# Patient Record
Sex: Male | Born: 1935 | Race: White | Hispanic: No | Smoking: Never smoker
Health system: Southern US, Community
[De-identification: ages and names within clinical notes are randomized; demographics above are authoritative.]

## PROBLEM LIST (undated history)

## (undated) DIAGNOSIS — I472 Ventricular tachycardia, unspecified: Secondary | ICD-10-CM

## (undated) DIAGNOSIS — N189 Chronic kidney disease, unspecified: Secondary | ICD-10-CM

## (undated) DIAGNOSIS — K4090 Unilateral inguinal hernia, without obstruction or gangrene, not specified as recurrent: Secondary | ICD-10-CM

## (undated) DIAGNOSIS — E785 Hyperlipidemia, unspecified: Secondary | ICD-10-CM

## (undated) DIAGNOSIS — I480 Paroxysmal atrial fibrillation: Secondary | ICD-10-CM

## (undated) DIAGNOSIS — I2510 Atherosclerotic heart disease of native coronary artery without angina pectoris: Secondary | ICD-10-CM

## (undated) DIAGNOSIS — Z87442 Personal history of urinary calculi: Secondary | ICD-10-CM

## (undated) DIAGNOSIS — I509 Heart failure, unspecified: Secondary | ICD-10-CM

## (undated) DIAGNOSIS — I10 Essential (primary) hypertension: Secondary | ICD-10-CM

## (undated) HISTORY — DX: Paroxysmal atrial fibrillation: I48.0

## (undated) HISTORY — PX: OTHER SURGICAL HISTORY: SHX169

## (undated) HISTORY — PX: CARDIAC CATHETERIZATION: SHX172

## (undated) HISTORY — PX: LITHOTRIPSY: SUR834

## (undated) HISTORY — PX: INGUINAL HERNIA REPAIR: SHX194

## (undated) HISTORY — PX: ELBOW SURGERY: SHX618

---

## 2007-01-07 ENCOUNTER — Emergency Department: Payer: Self-pay | Admitting: Emergency Medicine

## 2007-01-11 HISTORY — PX: CARDIAC DEFIBRILLATOR PLACEMENT: SHX171

## 2007-01-15 ENCOUNTER — Ambulatory Visit: Payer: Self-pay | Admitting: Orthopedic Surgery

## 2007-01-15 ENCOUNTER — Other Ambulatory Visit: Payer: Self-pay

## 2007-01-16 ENCOUNTER — Ambulatory Visit: Payer: Self-pay | Admitting: Orthopedic Surgery

## 2007-06-07 ENCOUNTER — Other Ambulatory Visit: Payer: Self-pay

## 2007-06-07 ENCOUNTER — Inpatient Hospital Stay: Payer: Self-pay | Admitting: Internal Medicine

## 2008-04-02 ENCOUNTER — Ambulatory Visit: Payer: Self-pay | Admitting: Internal Medicine

## 2009-07-09 ENCOUNTER — Ambulatory Visit: Payer: Self-pay | Admitting: Internal Medicine

## 2009-08-14 ENCOUNTER — Ambulatory Visit: Payer: Self-pay | Admitting: Urology

## 2009-08-20 ENCOUNTER — Ambulatory Visit: Payer: Self-pay | Admitting: Urology

## 2009-10-29 ENCOUNTER — Ambulatory Visit: Payer: Self-pay | Admitting: Urology

## 2010-02-10 ENCOUNTER — Ambulatory Visit: Payer: Self-pay | Admitting: Urology

## 2011-10-07 DIAGNOSIS — N401 Enlarged prostate with lower urinary tract symptoms: Secondary | ICD-10-CM | POA: Insufficient documentation

## 2011-10-07 DIAGNOSIS — N23 Unspecified renal colic: Secondary | ICD-10-CM

## 2011-10-07 DIAGNOSIS — R399 Unspecified symptoms and signs involving the genitourinary system: Secondary | ICD-10-CM | POA: Insufficient documentation

## 2011-10-07 DIAGNOSIS — N3000 Acute cystitis without hematuria: Secondary | ICD-10-CM | POA: Insufficient documentation

## 2011-10-07 HISTORY — DX: Unspecified renal colic: N23

## 2012-01-23 ENCOUNTER — Ambulatory Visit: Payer: Self-pay | Admitting: Urology

## 2012-08-07 ENCOUNTER — Ambulatory Visit: Payer: Self-pay | Admitting: Urology

## 2012-08-07 DIAGNOSIS — R310 Gross hematuria: Secondary | ICD-10-CM | POA: Insufficient documentation

## 2012-08-07 DIAGNOSIS — N210 Calculus in bladder: Secondary | ICD-10-CM | POA: Insufficient documentation

## 2012-08-07 DIAGNOSIS — R339 Retention of urine, unspecified: Secondary | ICD-10-CM | POA: Insufficient documentation

## 2012-09-13 ENCOUNTER — Ambulatory Visit: Payer: Self-pay | Admitting: Urology

## 2012-09-13 LAB — CBC WITH DIFFERENTIAL/PLATELET
HCT: 40.8 % (ref 40.0–52.0)
HGB: 13.7 g/dL (ref 13.0–18.0)
Lymphocyte %: 26.5 %
MCHC: 33.6 g/dL (ref 32.0–36.0)
MCV: 90 fL (ref 80–100)
Monocyte #: 0.5 x10 3/mm (ref 0.2–1.0)
Neutrophil #: 3.4 10*3/uL (ref 1.4–6.5)
Platelet: 117 10*3/uL — ABNORMAL LOW (ref 150–440)
RBC: 4.54 10*6/uL (ref 4.40–5.90)
WBC: 5.8 10*3/uL (ref 3.8–10.6)

## 2012-09-13 LAB — BASIC METABOLIC PANEL
Anion Gap: 3 — ABNORMAL LOW (ref 7–16)
BUN: 25 mg/dL — ABNORMAL HIGH (ref 7–18)
Calcium, Total: 9 mg/dL (ref 8.5–10.1)
Chloride: 110 mmol/L — ABNORMAL HIGH (ref 98–107)
Co2: 30 mmol/L (ref 21–32)
Creatinine: 1.47 mg/dL — ABNORMAL HIGH (ref 0.60–1.30)
EGFR (African American): 52 — ABNORMAL LOW
EGFR (Non-African Amer.): 45 — ABNORMAL LOW
Glucose: 82 mg/dL (ref 65–99)
Osmolality: 288 (ref 275–301)
Potassium: 4.2 mmol/L (ref 3.5–5.1)
Sodium: 143 mmol/L (ref 136–145)

## 2012-09-18 ENCOUNTER — Ambulatory Visit: Payer: Self-pay | Admitting: Urology

## 2012-10-27 ENCOUNTER — Emergency Department: Payer: Self-pay | Admitting: Emergency Medicine

## 2012-10-27 LAB — CBC
HGB: 14.3 g/dL (ref 13.0–18.0)
MCH: 30.2 pg (ref 26.0–34.0)
MCHC: 33.8 g/dL (ref 32.0–36.0)
MCV: 89 fL (ref 80–100)
Platelet: 163 10*3/uL (ref 150–440)
RBC: 4.75 10*6/uL (ref 4.40–5.90)

## 2012-10-27 LAB — BASIC METABOLIC PANEL
Chloride: 104 mmol/L (ref 98–107)
Co2: 34 mmol/L — ABNORMAL HIGH (ref 21–32)
EGFR (African American): 49 — ABNORMAL LOW
Glucose: 127 mg/dL — ABNORMAL HIGH (ref 65–99)
Potassium: 3.9 mmol/L (ref 3.5–5.1)

## 2012-10-27 LAB — TROPONIN I
Troponin-I: <0.02 ng/mL
Troponin-I: <0.02 ng/mL

## 2013-03-25 DIAGNOSIS — Z95810 Presence of automatic (implantable) cardiac defibrillator: Secondary | ICD-10-CM | POA: Insufficient documentation

## 2013-04-05 DIAGNOSIS — I471 Supraventricular tachycardia: Secondary | ICD-10-CM | POA: Insufficient documentation

## 2013-04-05 DIAGNOSIS — I472 Ventricular tachycardia, unspecified: Secondary | ICD-10-CM | POA: Insufficient documentation

## 2013-04-09 ENCOUNTER — Ambulatory Visit: Payer: Self-pay | Admitting: Cardiology

## 2013-04-09 LAB — BASIC METABOLIC PANEL
Anion Gap: 3 — ABNORMAL LOW (ref 7–16)
BUN: 18 mg/dL (ref 7–18)
CO2: 33 mmol/L — AB (ref 21–32)
Calcium, Total: 9.3 mg/dL (ref 8.5–10.1)
Chloride: 104 mmol/L (ref 98–107)
Creatinine: 1.53 mg/dL — ABNORMAL HIGH (ref 0.60–1.30)
EGFR (African American): 49 — ABNORMAL LOW
EGFR (Non-African Amer.): 42 — ABNORMAL LOW
GLUCOSE: 88 mg/dL (ref 65–99)
Osmolality: 281 (ref 275–301)
Potassium: 4.2 mmol/L (ref 3.5–5.1)
SODIUM: 140 mmol/L (ref 136–145)

## 2013-04-09 LAB — URINALYSIS, COMPLETE
Bacteria: NONE SEEN
Bilirubin,UR: NEGATIVE
Glucose,UR: NEGATIVE mg/dL (ref 0–75)
Ketone: NEGATIVE
LEUKOCYTE ESTERASE: NEGATIVE
Nitrite: NEGATIVE
PH: 5 (ref 4.5–8.0)
Protein: NEGATIVE
RBC,UR: NONE SEEN /HPF (ref 0–5)
SPECIFIC GRAVITY: 1.009 (ref 1.003–1.030)
Squamous Epithelial: <1
WBC UR: <1 /HPF (ref 0–5)

## 2013-04-09 LAB — CBC WITH DIFFERENTIAL/PLATELET
BASOS ABS: 0.1 10*3/uL (ref 0.0–0.1)
Basophil %: 0.9 %
EOS ABS: 0.2 10*3/uL (ref 0.0–0.7)
Eosinophil %: 3.3 %
HCT: 40.8 % (ref 40.0–52.0)
HGB: 13.6 g/dL (ref 13.0–18.0)
Lymphocyte #: 1.3 10*3/uL (ref 1.0–3.6)
Lymphocyte %: 23.1 %
MCH: 30.2 pg (ref 26.0–34.0)
MCHC: 33.3 g/dL (ref 32.0–36.0)
MCV: 91 fL (ref 80–100)
Monocyte #: 0.7 x10 3/mm (ref 0.2–1.0)
Monocyte %: 11.5 %
NEUTROS ABS: 3.5 10*3/uL (ref 1.4–6.5)
Neutrophil %: 61.2 %
Platelet: 119 10*3/uL — ABNORMAL LOW (ref 150–440)
RBC: 4.5 10*6/uL (ref 4.40–5.90)
RDW: 14.4 % (ref 11.5–14.5)
WBC: 5.7 10*3/uL (ref 3.8–10.6)

## 2013-04-09 LAB — PROTIME-INR
INR: 1
PROTHROMBIN TIME: 12.8 s (ref 11.5–14.7)

## 2013-04-09 LAB — APTT: Activated PTT: 32.1 secs (ref 23.6–35.9)

## 2013-04-12 ENCOUNTER — Ambulatory Visit: Payer: Self-pay | Admitting: Cardiology

## 2013-06-06 DIAGNOSIS — Z789 Other specified health status: Secondary | ICD-10-CM | POA: Insufficient documentation

## 2013-06-06 DIAGNOSIS — Z9889 Other specified postprocedural states: Secondary | ICD-10-CM | POA: Insufficient documentation

## 2013-06-06 DIAGNOSIS — I429 Cardiomyopathy, unspecified: Secondary | ICD-10-CM | POA: Insufficient documentation

## 2013-06-06 DIAGNOSIS — E785 Hyperlipidemia, unspecified: Secondary | ICD-10-CM | POA: Insufficient documentation

## 2013-06-06 DIAGNOSIS — I10 Essential (primary) hypertension: Secondary | ICD-10-CM | POA: Insufficient documentation

## 2014-05-02 NOTE — Op Note (Signed)
PATIENT NAME:  Spencer Coleman, Spencer Coleman MR#:  [id number] DATE OF BIRTH:  Oct 06, 1930  DATE OF PROCEDURE:  09/18/2012  PRINCIPAL DIAGNOSIS: Bladder calculus.   POSTOPERATIVE DIAGNOSIS: Bladder calculus.  PROCEDURE: Cystoscopy and laser litholapaxy.   SURGEON: Assunta GamblesBrian Loryn Haacke MD.   ANESTHESIA: Laryngeal mask airway anesthesia.   INDICATIONS: The patient is an 81 year old gentleman with a history of benign prostatic hypertrophy and bladder outlet obstruction. He developed recent hematuria. Cystoscopy evaluated a 2.3 cm bladder calculus with no other abnormalities. He presents for laser litholapaxy.   DESCRIPTION OF PROCEDURE: After informed consent was obtained, the patient was taken to the operating room and placed in the dorsal lithotomy position under laryngeal mask airway anesthesia. The patient was then prepped and draped in the usual standard fashion. The 22-French rigid cystoscope was introduced into the urethra under direct vision with no urethral abnormalities noted. Upon entering the prostatic fossa, moderate bilobar hypertrophy was noted with only partial visual obstruction. Upon entering the bladder, the mucosa was inspected in its entirety. Mild erythema was noted on the bladder base consistent with irritation from the bladder calculus. There was a 2.3 cm irregular spiked bladder calculus on the bladder base with some adherent clots. No active bleeding was noted at the time. The holmium laser fiber was then utilized to fragment the stone into multiple smaller pieces. A significant amount of dusting was encountered with the fragmentation. The larger fragments were irrigated from the bladder. These were collected and will be sent for stone analysis. Upon examination after removal, no other significant abnormalities were noted within the bladder. A small area of bleeding was noted from the bladder neck. This was cauterized utilizing a Bugbee electrode. The patient's AICD was  turned off with a magnet during the  electrocautery. There were no problems or complications. The bladder was drained. The cystoscope was removed. The patient was returned to the supine position and awakened from laryngeal mask airway anesthesia. He was taken to the recovery room in stable condition. There were no problems or complications. The patient tolerated the procedure well.  ____________________________ Madolyn FriezeBrian S. Achilles Dunkope, MD bsc:aw D: 09/18/2012 09:26:31 ET T: 09/18/2012 09:31:40 ET JOB#: [id number]  cc: Madolyn FriezeBrian S. Achilles Dunkope, MD, <Dictator> Madolyn FriezeBRIAN S Kelyn Koskela MD ELECTRONICALLY SIGNED 09/18/2012 17:40

## 2014-05-03 NOTE — Op Note (Signed)
PATIENT NAME:  Spencer Coleman, Spencer Coleman MR#:  [id number] DATE OF BIRTH:  1930/06/18  DATE OF PROCEDURE:  04/12/2013  ATTENDING PHYSICIAN: Spencer BeeKevin L. Maisie Fushomas, MD   TITLE OF PROCEDURE NOTE: Implantation of a biventricular implantable cardiac defibrillator generator.   PREPROCEDURE DIAGNOSES: Biventricular implantable cardiac defibrillator elective replacement indicator, left bundle branch block and chronic systolic heart failure.   POSTPROCEDURE DIAGNOSES: Biventricular implantable cardiac defibrillator elective replacement indicator, left bundle branch block and chronic systolic heart failure.   DETAILS OF PROCEDURE: The patient was brought to the operating room in a fasting, nonsedated state. The patient was prepped and draped in the usual manner. The appropriate resuscitative equipment was attached to the patient. The area of the left infraclavicular fossa was prepped with ChloraPrep. The patient was then draped and a timeout was performed. Once time in was commenced, local anesthetic was administered into the left infraclavicular fossa. We used 1% lidocaine. Using a scalpel, an incision was made over the old device incision site. Using a  combination of electrocautery and blunt dissection, the ICD and subsequent leads were removed from the pocket. The pocket was then revised to accommodate the new device. Adequate hemostasis was obtained. The device was subsequently removed from the leads and the leads were tested. Sensed Coleman waves for the RV lead were 11.6 mV, the threshold was 0.75 v at 0.4 ms. The LV lead, since Coleman waves were not obtained, the impedance was 627 ohms, the threshold was 1.125 v at 0.4 ms. For the right atrial lead, the lead impedance was 380 and the threshold was 0.625 v at 0.4 ms, and the threshold was 1.5 v at 0.4 ms. Sensed P wave was 2.1 mV. The lead impedance for the RV lead was 551 ohms. The pocket was then irrigated with copious amounts of gentamicin. Again, adequate hemostasis was confirmed. The  new device was attached to the leads and was placed in the pocket. The pocket was then closed with running layers of 2-0 and 3-0 Vicryl and the subcuticular layer was closed with 4-0 Vicryl. Steri-Strips were attached over the incision site as well as a sterile gauze and OpSite.   There were no complications noted at the conclusion of the procedure. There was no fluoroscopy utilized.   SUMMARY OF IMPLANTED HARDWARE: The patient received a Medtronic biventricular implantable cardiac defibrillator, model number Viva [id number] the serial number was [id number]. The existing leads that were connected to the device were as follows. The patient has a Medtronic right atrial lead, serial number [id number] implanted August 09, 2007. The right ventricular lead is a Medtronic Sprint Quattro, serial number [id number] implanted August 09, 2007 and the LV lead is a Medtronic [id number], serial number [id number] implanted August 09, 2007.   FINAL PROGRAM PARAMETERS: The device was program mode DDD with a lower rate limit of 60 and upper tracking rate of 130, adaptive CRT pacing was placed on. Paced AV delay 160, sensed AV delay 110. V to V offset was 0 ms. Tachy therapies were programmed as follows: VF detection was programmed on at a rate of 188 beats per minute with an initial detection of 18 out of 24. There was a VT monitor zone programmed at 150 with detection at 32 intervals.   ____________________________ Spencer GenreKevin L. Maisie Fushomas, MD klt:aw D: 04/12/2013 11:30:57 ET T: 04/12/2013 12:07:42 ET JOB#: [id number]  cc: Spencer BeeKevin L. Maisie Fushomas, MD, <Dictator> Sharion SettlerKEVIN L Fronia Depass MD ELECTRONICALLY SIGNED 05/01/2013 11:16

## 2015-01-09 ENCOUNTER — Encounter: Payer: Self-pay | Admitting: Medical Oncology

## 2015-01-09 ENCOUNTER — Emergency Department: Payer: Medicare HMO

## 2015-01-09 ENCOUNTER — Inpatient Hospital Stay
Admission: EM | Admit: 2015-01-09 | Discharge: 2015-01-14 | DRG: 464 | Disposition: A | Discharge status: Skilled Nursing Facility | Payer: Medicare HMO | Attending: Orthopedic Surgery | Admitting: Orthopedic Surgery

## 2015-01-09 DIAGNOSIS — N189 Chronic kidney disease, unspecified: Secondary | ICD-10-CM | POA: Diagnosis present

## 2015-01-09 DIAGNOSIS — Z95810 Presence of automatic (implantable) cardiac defibrillator: Secondary | ICD-10-CM

## 2015-01-09 DIAGNOSIS — Y92009 Unspecified place in unspecified non-institutional (private) residence as the place of occurrence of the external cause: Secondary | ICD-10-CM

## 2015-01-09 DIAGNOSIS — I2510 Atherosclerotic heart disease of native coronary artery without angina pectoris: Secondary | ICD-10-CM | POA: Diagnosis present

## 2015-01-09 DIAGNOSIS — Z8249 Family history of ischemic heart disease and other diseases of the circulatory system: Secondary | ICD-10-CM | POA: Diagnosis not present

## 2015-01-09 DIAGNOSIS — Z809 Family history of malignant neoplasm, unspecified: Secondary | ICD-10-CM

## 2015-01-09 DIAGNOSIS — I472 Ventricular tachycardia: Secondary | ICD-10-CM | POA: Diagnosis present

## 2015-01-09 DIAGNOSIS — M25062 Hemarthrosis, left knee: Secondary | ICD-10-CM | POA: Diagnosis present

## 2015-01-09 DIAGNOSIS — Z9889 Other specified postprocedural states: Secondary | ICD-10-CM | POA: Diagnosis not present

## 2015-01-09 DIAGNOSIS — W010XXA Fall on same level from slipping, tripping and stumbling without subsequent striking against object, initial encounter: Secondary | ICD-10-CM | POA: Diagnosis present

## 2015-01-09 DIAGNOSIS — I130 Hypertensive heart and chronic kidney disease with heart failure and stage 1 through stage 4 chronic kidney disease, or unspecified chronic kidney disease: Secondary | ICD-10-CM | POA: Diagnosis present

## 2015-01-09 DIAGNOSIS — S82002A Unspecified fracture of left patella, initial encounter for closed fracture: Secondary | ICD-10-CM | POA: Diagnosis present

## 2015-01-09 DIAGNOSIS — I429 Cardiomyopathy, unspecified: Secondary | ICD-10-CM | POA: Diagnosis present

## 2015-01-09 DIAGNOSIS — N200 Calculus of kidney: Secondary | ICD-10-CM | POA: Insufficient documentation

## 2015-01-09 DIAGNOSIS — S82009A Unspecified fracture of unspecified patella, initial encounter for closed fracture: Secondary | ICD-10-CM

## 2015-01-09 DIAGNOSIS — Z8679 Personal history of other diseases of the circulatory system: Secondary | ICD-10-CM

## 2015-01-09 DIAGNOSIS — Z79899 Other long term (current) drug therapy: Secondary | ICD-10-CM | POA: Diagnosis not present

## 2015-01-09 DIAGNOSIS — E785 Hyperlipidemia, unspecified: Secondary | ICD-10-CM | POA: Diagnosis present

## 2015-01-09 DIAGNOSIS — I5022 Chronic systolic (congestive) heart failure: Secondary | ICD-10-CM | POA: Diagnosis present

## 2015-01-09 DIAGNOSIS — S82042A Displaced comminuted fracture of left patella, initial encounter for closed fracture: Principal | ICD-10-CM | POA: Diagnosis present

## 2015-01-09 DIAGNOSIS — R0902 Hypoxemia: Secondary | ICD-10-CM

## 2015-01-09 DIAGNOSIS — I509 Heart failure, unspecified: Secondary | ICD-10-CM | POA: Insufficient documentation

## 2015-01-09 HISTORY — DX: Essential (primary) hypertension: I10

## 2015-01-09 HISTORY — DX: Hyperlipidemia, unspecified: E78.5

## 2015-01-09 HISTORY — DX: Ventricular tachycardia, unspecified: I47.20

## 2015-01-09 HISTORY — DX: Ventricular tachycardia: I47.2

## 2015-01-09 HISTORY — DX: Chronic kidney disease, unspecified: N18.9

## 2015-01-09 HISTORY — DX: Unspecified fracture of unspecified patella, initial encounter for closed fracture: S82.009A

## 2015-01-09 HISTORY — DX: Atherosclerotic heart disease of native coronary artery without angina pectoris: I25.10

## 2015-01-09 HISTORY — DX: Heart failure, unspecified: I50.9

## 2015-01-09 LAB — CBC
HCT: 44.6 % (ref 40.0–52.0)
Hemoglobin: 15 g/dL (ref 13.0–18.0)
MCH: 30 pg (ref 26.0–34.0)
MCHC: 33.6 g/dL (ref 32.0–36.0)
MCV: 89.2 fL (ref 80.0–100.0)
PLATELETS: 132 10*3/uL — AB (ref 150–440)
RBC: 4.99 MIL/uL (ref 4.40–5.90)
RDW: 14 % (ref 11.5–14.5)
WBC: 7.9 10*3/uL (ref 3.8–10.6)

## 2015-01-09 LAB — ABO/RH: ABO/RH(D): O POS

## 2015-01-09 LAB — BASIC METABOLIC PANEL
ANION GAP: 6 (ref 5–15)
BUN: 24 mg/dL — ABNORMAL HIGH (ref 6–20)
CALCIUM: 9.4 mg/dL (ref 8.9–10.3)
CO2: 28 mmol/L (ref 22–32)
Chloride: 105 mmol/L (ref 101–111)
Creatinine, Ser: 1.38 mg/dL — ABNORMAL HIGH (ref 0.61–1.24)
GFR calc Af Amer: 53 mL/min — ABNORMAL LOW (ref >60)
GFR, EST NON AFRICAN AMERICAN: 46 mL/min — AB (ref >60)
GLUCOSE: 110 mg/dL — AB (ref 65–99)
Potassium: 4.5 mmol/L (ref 3.5–5.1)
Sodium: 139 mmol/L (ref 135–145)

## 2015-01-09 LAB — APTT: APTT: 28 s (ref 24–36)

## 2015-01-09 LAB — PROTIME-INR
INR: 0.99
Prothrombin Time: 13.3 seconds (ref 11.4–15.0)

## 2015-01-09 LAB — TYPE AND SCREEN
ABO/RH(D): O POS
Antibody Screen: NEGATIVE

## 2015-01-09 MED ORDER — SIMVASTATIN 40 MG PO TABS
40.0000 mg | ORAL_TABLET | Freq: Every day | ORAL | Status: DC
  Administered 2015-01-09 – 2015-01-14 (×5): 40 mg via ORAL
  Filled 2015-01-09 (×5): qty 1

## 2015-01-09 MED ORDER — OXYCODONE HCL 5 MG PO TABS
ORAL_TABLET | ORAL | Status: AC
  Administered 2015-01-09: 10 mg via ORAL
  Filled 2015-01-09: qty 2

## 2015-01-09 MED ORDER — HYDROCODONE-ACETAMINOPHEN 5-325 MG PO TABS
ORAL_TABLET | ORAL | Status: AC
  Filled 2015-01-09: qty 2

## 2015-01-09 MED ORDER — SENNOSIDES-DOCUSATE SODIUM 8.6-50 MG PO TABS
1.0000 | ORAL_TABLET | Freq: Two times a day (BID) | ORAL | Status: DC
  Administered 2015-01-09 – 2015-01-14 (×7): 1 via ORAL
  Filled 2015-01-09 (×7): qty 1

## 2015-01-09 MED ORDER — AMIODARONE HCL 200 MG PO TABS
400.0000 mg | ORAL_TABLET | Freq: Every day | ORAL | Status: DC
  Administered 2015-01-09 – 2015-01-13 (×4): 400 mg via ORAL
  Filled 2015-01-09 (×4): qty 2

## 2015-01-09 MED ORDER — FUROSEMIDE 20 MG PO TABS
20.0000 mg | ORAL_TABLET | Freq: Every day | ORAL | Status: DC
  Administered 2015-01-09 – 2015-01-14 (×5): 20 mg via ORAL
  Filled 2015-01-09 (×5): qty 1

## 2015-01-09 MED ORDER — ONDANSETRON 4 MG PO TBDP
4.0000 mg | ORAL_TABLET | Freq: Once | ORAL | Status: AC
  Administered 2015-01-09: 4 mg via ORAL

## 2015-01-09 MED ORDER — HYDROCODONE-ACETAMINOPHEN 5-325 MG PO TABS
2.0000 | ORAL_TABLET | Freq: Once | ORAL | Status: AC
  Administered 2015-01-09: 2 via ORAL

## 2015-01-09 MED ORDER — ONDANSETRON HCL 4 MG/2ML IJ SOLN
4.0000 mg | Freq: Four times a day (QID) | INTRAMUSCULAR | Status: DC | PRN
  Administered 2015-01-10: 4 mg via INTRAVENOUS

## 2015-01-09 MED ORDER — OXYCODONE HCL 5 MG PO TABS
5.0000 mg | ORAL_TABLET | ORAL | Status: DC | PRN
  Administered 2015-01-09: 10 mg via ORAL
  Administered 2015-01-10: 5 mg via ORAL
  Filled 2015-01-09 (×2): qty 1
  Filled 2015-01-09: qty 2

## 2015-01-09 MED ORDER — POLYETHYLENE GLYCOL 3350 17 G PO PACK: 17.0000 g | PACK | Freq: Every day | ORAL | Status: DC | PRN

## 2015-01-09 MED ORDER — CEFAZOLIN SODIUM-DEXTROSE 2-3 GM-% IV SOLR
2.0000 g | INTRAVENOUS | Status: AC
  Administered 2015-01-10: 2 g via INTRAVENOUS
  Filled 2015-01-09 (×2): qty 50

## 2015-01-09 MED ORDER — METOCLOPRAMIDE HCL 10 MG PO TABS
10.0000 mg | ORAL_TABLET | Freq: Three times a day (TID) | ORAL | Status: AC
  Administered 2015-01-09 – 2015-01-11 (×7): 10 mg via ORAL
  Filled 2015-01-09 (×7): qty 1

## 2015-01-09 MED ORDER — PANTOPRAZOLE SODIUM 40 MG PO TBEC
40.0000 mg | DELAYED_RELEASE_TABLET | Freq: Two times a day (BID) | ORAL | Status: DC
  Administered 2015-01-09 – 2015-01-14 (×10): 40 mg via ORAL
  Filled 2015-01-09 (×10): qty 1

## 2015-01-09 MED ORDER — ACETAMINOPHEN 650 MG RE SUPP: 650.0000 mg | Freq: Four times a day (QID) | RECTAL | Status: DC | PRN

## 2015-01-09 MED ORDER — ONDANSETRON HCL 4 MG PO TABS: 4.0000 mg | ORAL_TABLET | Freq: Four times a day (QID) | ORAL | Status: DC | PRN

## 2015-01-09 MED ORDER — MORPHINE SULFATE (PF) 2 MG/ML IV SOLN: 2.0000 mg | INTRAVENOUS | Status: DC | PRN

## 2015-01-09 MED ORDER — SODIUM CHLORIDE 0.9 % IV SOLN
INTRAVENOUS | Status: DC
  Administered 2015-01-09: 21:00:00 via INTRAVENOUS

## 2015-01-09 MED ORDER — METOPROLOL TARTRATE 50 MG PO TABS
50.0000 mg | ORAL_TABLET | Freq: Two times a day (BID) | ORAL | Status: DC
  Administered 2015-01-09 – 2015-01-14 (×10): 50 mg via ORAL
  Filled 2015-01-09 (×10): qty 1

## 2015-01-09 MED ORDER — ACETAMINOPHEN 325 MG PO TABS
650.0000 mg | ORAL_TABLET | Freq: Four times a day (QID) | ORAL | Status: DC | PRN
  Filled 2015-01-09: qty 2

## 2015-01-09 MED ORDER — SODIUM CHLORIDE 0.9 % IJ SOLN
3.0000 mL | Freq: Two times a day (BID) | INTRAMUSCULAR | Status: DC
  Administered 2015-01-10 – 2015-01-14 (×7): 3 mL via INTRAVENOUS

## 2015-01-09 MED ORDER — ONDANSETRON 4 MG PO TBDP
ORAL_TABLET | ORAL | Status: AC
Start: 2015-01-09 — End: 2015-01-09
  Administered 2015-01-09: 4 mg via ORAL
  Filled 2015-01-09: qty 1

## 2015-01-09 NOTE — ED Notes (Signed)
Pt reports he tripped over a pipe and injured his left knee, pt denies hitting head or loc. Pt NAD, A/O x 4.

## 2015-01-09 NOTE — ED Notes (Signed)
Pt states he was walking under a shed and tripped over a pipe states his left knee hit the concrete, swelling to that knee and a small abrasion. Pt is unable to move leg without pain

## 2015-01-09 NOTE — H&P (Signed)
[facility] [place] [facility]   PATIENT NAME: Spencer Coleman    MR#:  [id number]  DATE OF BIRTH:  Oct 24, 1930  DATE OF ADMISSION:  01/09/2015  PRIMARY CARE PHYSICIAN: Barbette Reichmann, MD   REQUESTING/REFERRING PHYSICIAN: Dr. Francesco Sor  CHIEF COMPLAINT:   Chief Complaint  Patient presents with  . Fall  . Knee Pain    HISTORY OF PRESENT ILLNESS:  Spencer Coleman  is a 81 y.o. male with a known history of nonischemic cardiomyopathy with last known EF 40%, CAD with moderate disease, Vtach history s/o ICD and pacemaker and CKD presents to the hospital after a mechanical fall at home and left patellar fracture. Patient states that he stays at home by himself, very independent at baseline. Does not use any assistive devices to ambulate. He says he was walking, had his attention on getting his cell phone from the pocket, did not see the pipe that was in front of him, tripped over it and fell on his left knee. Since then he has significant swelling and pain of the left knee. Unable to bear weight and presented to the emergency room. X-ray shows comminuted left patellar fracture. Due to his significant cardiac history, medical consult requested to clear him for surgery. Spencer Coleman was just seen by his cardiologist about a month ago for routine visit and has been doing very stable. Denies any chest pain, dyspnea on exertion. Does not use any home oxygen. Denies any recent fevers, chills, or illnesses. Has been doing very stable. EKG here indicates paced rhythm with heart rate of 61. Chest x-ray without any acute findings and labs with no new changes.  PAST MEDICAL HISTORY:   Past Medical History  Diagnosis Date  . CHF (congestive heart failure) (HCC)     nonischemic cardiomyopathy  . CKD (chronic kidney disease)   . CAD (coronary artery disease)     Nonobstructive cardiac disease by cath  . Ventricular tachycardia (HCC)     s/p ICD  . Hypertension   . Hyperlipidemia      PAST SURGICAL HISTOIRY:   Past Surgical History  Procedure Laterality Date  . Pacemaker insertion    . Cardiac defibrillator placement    . Cardiac catheterization    . Orif left wrist fracture    . Inguinal hernia repair    . Lithotripsy      SOCIAL HISTORY:   Social History  Substance Use Topics  . Smoking status: Never Smoker   . Smokeless tobacco: Not on file  . Alcohol Use: No    FAMILY HISTORY:   Family History  Problem Relation Age of Onset  . CAD Mother   . Kidney cancer Father     DRUG ALLERGIES:  No Known Allergies  REVIEW OF SYSTEMS:   Review of Systems  Constitutional: Negative for fever, chills, weight loss and malaise/fatigue.  HENT: Negative for ear discharge, ear pain, hearing loss, nosebleeds and tinnitus.   Eyes: Negative for blurred vision, double vision and photophobia.  Respiratory: Negative for cough, hemoptysis, shortness of breath and wheezing.   Cardiovascular: Negative for chest pain, palpitations, orthopnea and leg swelling.  Gastrointestinal: Negative for heartburn, nausea, vomiting, abdominal pain, diarrhea, constipation and melena.  Genitourinary: Negative for dysuria, urgency, frequency and hematuria.  Musculoskeletal: Positive for joint pain. Negative for myalgias, back pain and neck pain.       Left knee pain  Skin: Negative for rash.  Neurological: Negative for dizziness, tingling, tremors, sensory change, speech change, focal  weakness and headaches.  Endo/Heme/Allergies: Does not bruise/bleed easily.  Psychiatric/Behavioral: Negative for depression.     MEDICATIONS AT HOME:   Prior to Admission medications   Medication Sig Start Date End Date Taking? Authorizing Provider  amiodarone (PACERONE) 400 MG tablet Take 1 tablet by mouth at bedtime.  10/27/14  Yes Historical Provider, MD  furosemide (LASIX) 20 MG tablet Take 1 tablet by mouth daily. 10/27/14  Yes Historical Provider, MD  metoprolol (LOPRESSOR) 50 MG tablet  Take 1 tablet by mouth 2 (two) times daily. 10/27/14  Yes Historical Provider, MD  simvastatin (ZOCOR) 40 MG tablet Take 1 tablet by mouth daily. 10/27/14  Yes Historical Provider, MD      VITAL SIGNS:  Blood pressure 117/69, pulse 64, temperature 97.4 F (36.3 C), temperature source Oral, resp. rate 20, height 5\' 10"  (1.778 m), weight 93.441 kg (206 lb), SpO2 100 %.  PHYSICAL EXAMINATION:   Physical Exam  GENERAL:  81 y.o.-year-old patient sitting in the chair with no acute distress.  EYES: Pupils equal, round, reactive to light and accommodation. No scleral icterus. Extraocular muscles intact.  HEENT: Head atraumatic, normocephalic. Oropharynx and nasopharynx clear.  NECK:  Supple, no jugular venous distention. No thyroid enlargement, no tenderness.  LUNGS: Normal breath sounds bilaterally, no wheezing, rales,rhonchi or crepitation. No use of accessory muscles of respiration.  CARDIOVASCULAR: S1, S2 normal. No rubs, or gallops.3/6 systolic murmur present, ICD in place.  ABDOMEN: Soft, nontender, nondistended. Bowel sounds present. No organomegaly or mass.  EXTREMITIES: Left knee swollen medially, no open injury, tender to touch on the knee. No pedal edema, cyanosis, or clubbing.  NEUROLOGIC: Cranial nerves II through XII are intact. Muscle strength 5/5 in all extremities. Sensation intact. Gait not checked.  PSYCHIATRIC: The patient is alert and oriented x 3.  SKIN: No obvious rash, lesion, or ulcer.   LABORATORY PANEL:   CBC  Recent Labs Lab 01/09/15 1847  WBC 7.9  HGB 15.0  HCT 44.6  PLT 132*   ------------------------------------------------------------------------------------------------------------------  Chemistries   Recent Labs Lab 01/09/15 1847  NA 139  K 4.5  CL 105  CO2 28  GLUCOSE 110*  BUN 24*  CREATININE 1.38*  CALCIUM 9.4   ------------------------------------------------------------------------------------------------------------------  Cardiac  Enzymes No results for input(s): TROPONINI in the last 168 hours. ------------------------------------------------------------------------------------------------------------------  RADIOLOGY:  Dg Chest Port 1 View  01/09/2015  CLINICAL DATA:  81 year old male - preoperative respiratory examination prior to knee surgery. EXAM: PORTABLE CHEST 1 VIEW COMPARISON:  10/27/2012 chest radiograph FINDINGS: The cardiomediastinal silhouette is unremarkable. A left-sided pacemaker/ ICD again noted. Elevation of the right hemidiaphragm is unchanged. There is no evidence of focal airspace disease, pulmonary edema, suspicious pulmonary nodule/mass, pleural effusion, or pneumothorax. No acute bony abnormalities are identified. IMPRESSION: No active disease. Electronically Signed   By: Harmon PierJeffrey  Hu M.D.   On: 01/09/2015 19:05   Dg Knee Complete 4 Views Left  01/09/2015  CLINICAL DATA:  Fall on concrete. Left patellar pain and abrasion. Initial encounter. EXAM: LEFT KNEE - COMPLETE 4+ VIEW COMPARISON:  None. FINDINGS: Comminuted fracture is seen involving the patella, with mild displacement of fracture fragments. Prominent prepatellar soft tissue swelling is seen as well as small lipohemarthrosis. No other fractures are identified. No evidence of dislocation. Peripheral vascular calcification noted. IMPRESSION: Comminuted patellar fracture. Associated small lipohemarthrosis and soft tissue swelling. Electronically Signed   By: Myles RosenthalJohn  Stahl M.D.   On: 01/09/2015 17:39    EKG:   Orders placed or performed during the hospital  encounter of 01/09/15  . ED EKG  . ED EKG  . EKG 12-Lead  . EKG 12-Lead    IMPRESSION AND PLAN:   Spencer Coleman  is a 81 y.o. male with a known history of nonischemic cardiomyopathy with last known EF 40%, CAD with moderate disease, Vtach history s/o ICD and pacemaker and CKD presents to the hospital after a mechanical fall at home and left patellar fracture.  #1 preoperative evaluation for  left patellar fracture-moderate risk with his cardiac history but has been very stable. -Discussed with cardiologist Dr. Lady Gary, who will see the patient tomorrow a.m. -Once cleared by cardiology, can proceed with surgery. -EKG and labs reviewed. - Hold aspirin. Continue his metoprolol. -Further management by Orthopedics. Postoperative pain management, PT consult and DVT prophylaxis advised. - Possible surgery scheduled for tomorrow AM  #2 non-ischemic cardiomyopathy-last echocardiogram from 2015 showing EF of 40%. -Also has history of ventricular tachycardia so status post biventricular ICD -Electrolytes within normal limits. -Continue amiodarone daily. Also on metoprolol. -Continue low-dose Lasix postoperatively  #3 CAD-moderate disease based on cardiac catheterization. No prior intervention done. -Hold aspirin for now. Continue metoprolol and statin.  #4 CKD-baseline creatinine around 1.4. Stable kidney function. -Monitor  #5 DVT Prophylaxis- after surgery    All the records are reviewed and case discussed with Consulting provider. Management plans discussed with the patient, family and they are in agreement.  CODE STATUS: Full Code  TOTAL TIME TAKING CARE OF THIS PATIENT: 50 minutes.    Mishayla Sliwinski M.D on 01/09/2015 at 7:58 PM  Between 7am to 6pm - Pager - [phone]  After 6pm go to [email or web address] - password [email or web address]  [facility]  Office  [phone]  CC: Primary care Physician: Barbette Reichmann, MD

## 2015-01-09 NOTE — ED Provider Notes (Signed)
[facility] Emergency Department Provider Note REMINDER - THIS NOTE IS NOT A FINAL MEDICAL RECORD UNTIL IT IS SIGNED. UNTIL THEN, THE CONTENT BELOW MAY REFLECT INFORMATION FROM A DOCUMENTATION TEMPLATE, NOT THE ACTUAL PATIENT VISIT. ____________________________________________  Time seen: Approximately 6:57 PM  I have reviewed the triage vital signs and the nursing notes.   HISTORY  Chief Complaint Fall and Knee Pain    HPI Spencer Coleman is a 81 y.o. male who presents to the ER after tripping over a pipe while walking. He reports that he fell and landed on his left knee, tripped forward and landed on his left knee and elbow. He's had immediate pain and inability to walk because of pain across the front of the left knee since. Reports a very sharp severe and throbbing pain especially when he tried to stand up. He also reports he slightly scraped his right elbow, but is not causing any pain or discomfort. He did not strike his head, or injure his neck, or sustain any other injury. No numbness or tingling in the feet. No weakness of the legs or arms.   Past Medical History  Diagnosis Date  . CHF (congestive heart failure) (HCC)     nonischemic cardiomyopathy  . CKD (chronic kidney disease)   . CAD (coronary artery disease)     Nonobstructive cardiac disease by cath  . Ventricular tachycardia (HCC)     s/p ICD  . Hypertension   . Hyperlipidemia     Patient Active Problem List   Diagnosis Date Noted  . Patellar fracture 01/09/2015  . Calculus of kidney 01/09/2015  . Congestive heart failure (HCC) 01/09/2015  . Patella fracture 01/09/2015  . Cardiomyopathy (HCC) 06/06/2013  . History of cardiac catheterization 06/06/2013  . BP (high blood pressure) 06/06/2013  . HLD (hyperlipidemia) 06/06/2013  . Ventricular tachycardia (HCC) 04/05/2013  . Automatic implantable cardioverter-defibrillator in situ 03/25/2013    Past Surgical History  Procedure  Laterality Date  . Pacemaker insertion    . Cardiac defibrillator placement    . Cardiac catheterization    . Orif left wrist fracture    . Inguinal hernia repair    . Lithotripsy      No current outpatient prescriptions on file.  Allergies Review of patient's allergies indicates no known allergies.  Family History  Problem Relation Age of Onset  . CAD Mother   . Kidney cancer Father     Social History Social History  Substance Use Topics  . Smoking status: Never Smoker   . Smokeless tobacco: None  . Alcohol Use: No    Review of Systems Constitutional: No fever/chills. Eyes: No visual changes. ENT: No sore throat. Cardiovascular: Denies chest pain. Respiratory: Denies shortness of breath. Gastrointestinal: No abdominal pain.  No nausea, no vomiting.  No diarrhea.  No constipation. Genitourinary: Negative for dysuria. Musculoskeletal: Negative for back pain. Skin: Negative for rash. Neurological: Negative for headaches, focal weakness or numbness.  10-point ROS otherwise negative.  ____________________________________________   PHYSICAL EXAM:  VITAL SIGNS: ED Triage Vitals  Enc Vitals Group     BP 01/09/15 1633 117/69 mmHg     Pulse Rate 01/09/15 1633 64     Resp 01/09/15 1633 20     Temp 01/09/15 1633 97.4 F (36.3 C)     Temp Source 01/09/15 1633 Oral     SpO2 01/09/15 1633 100 %     Weight 01/09/15 1633 206 lb (93.441 kg)     Height 01/09/15  1633 5\' 10"  (1.778 m)     Head Cir --      Peak Flow --      Pain Score 01/09/15 1633 10     Pain Loc --      Pain Edu? --      Excl. in GC? --    Constitutional: Alert and oriented. Well appearing and in no acute distress. Eyes: Conjunctivae are normal. PERRL. EOMI. Head: Atraumatic. Nose: No congestion/rhinnorhea. Mouth/Throat: Mucous membranes are moist.  Oropharynx non-erythematous. Neck: No stridor.   Cardiovascular: Normal rate, regular rhythm. Grossly normal heart sounds.  Good peripheral  circulation. Respiratory: Normal respiratory effort.  No retractions. Lungs CTAB. Gastrointestinal: Soft and nontender. No distention. No abdominal bruits. No CVA tenderness. Musculoskeletal: No lower extremity tenderness nor edema except for obvious edema and swelling anterior to the left patella. Patient unable to range the left knee secondary to pain. No pain or discomfort in the left hip, upper thigh, left lower leg or foot. Dopplerable DP pulses bilaterally. Patient has slow capillary refill in all extremities, states that he always has cold hands due to poor heart.  No joint effusions. Neurologic:  Normal speech and language. No gross focal neurologic deficits are appreciated. Skin:  Skin is warm, dry and intact. No rash noted. Psychiatric: Mood and affect are normal. Speech and behavior are normal.  ____________________________________________   LABS (all labs ordered are listed, but only abnormal results are displayed)  Labs Reviewed  CBC - Abnormal; Notable for the following:    Platelets 132 (*)    All other components within normal limits  BASIC METABOLIC PANEL - Abnormal; Notable for the following:    Glucose, Bld 110 (*)    BUN 24 (*)    Creatinine, Ser 1.38 (*)    GFR calc non Af Amer 46 (*)    GFR calc Af Amer 53 (*)    All other components within normal limits  BASIC METABOLIC PANEL - Abnormal; Notable for the following:    Glucose, Bld 106 (*)    BUN 25 (*)    Creatinine, Ser 1.31 (*)    GFR calc non Af Amer 49 (*)    GFR calc Af Amer 57 (*)    All other components within normal limits  CBC - Abnormal; Notable for the following:    Platelets 123 (*)    All other components within normal limits  CBC - Abnormal; Notable for the following:    RBC 3.97 (*)    Hemoglobin 11.7 (*)    HCT 35.0 (*)    Platelets 97 (*)    All other components within normal limits  BASIC METABOLIC PANEL - Abnormal; Notable for the following:    BUN 22 (*)    Creatinine, Ser 1.34 (*)     Calcium 8.5 (*)    GFR calc non Af Amer 48 (*)    GFR calc Af Amer 55 (*)    Anion gap 4 (*)    All other components within normal limits  MRSA PCR SCREENING  PROTIME-INR  APTT  TYPE AND SCREEN  ABO/RH   ____________________________________________  EKG  Reviewed and interpreted by me Normal rate, AV dual pacer Rate 60 Expected T-wave abnormalities for a paced rhythm. ____________________________________________  RADIOLOGY  inal result by Rad Results In Interface (01/09/15 19:05:24)   Narrative:   CLINICAL DATA: 81 year old male - preoperative respiratory examination prior to knee surgery.  EXAM: PORTABLE CHEST 1 VIEW  COMPARISON: 10/27/2012 chest radiograph  FINDINGS: The cardiomediastinal silhouette is unremarkable.  A left-sided pacemaker/ ICD again noted.  Elevation of the right hemidiaphragm is unchanged.  There is no evidence of focal airspace disease, pulmonary edema, suspicious pulmonary nodule/mass, pleural effusion, or pneumothorax.  No acute bony abnormalities are identified.  IMPRESSION: No active disease.   Electronically Signed By: Harmon Pier M.D. On: 01/09/2015 19:05          DG Knee Complete 4 Views Left (Final result) Result time: 01/09/15 17:39:36   Final result by Rad Results In Interface (01/09/15 17:39:36)   Narrative:   CLINICAL DATA: Fall on concrete. Left patellar pain and abrasion. Initial encounter.  EXAM: LEFT KNEE - COMPLETE 4+ VIEW  COMPARISON: None.  FINDINGS: Comminuted fracture is seen involving the patella, with mild displacement of fracture fragments. Prominent prepatellar soft tissue swelling is seen as well as small lipohemarthrosis.  No other fractures are identified. No evidence of dislocation. Peripheral vascular calcification noted.  IMPRESSION: Comminuted patellar fracture. Associated small lipohemarthrosis and soft tissue swelling.   Electronically Signed By: Myles Rosenthal  M.D. On: 01/09/2015 17:39       ____________________________________________   PROCEDURES  Procedure(s) performed: None  Critical Care performed: No  ____________________________________________   INITIAL IMPRESSION / ASSESSMENT AND PLAN / ED COURSE  Pertinent labs & imaging results that were available during my care of the patient were reviewed by me and considered in my medical decision making (see chart for details).  Isolated fall with injury left knee and small abrasion to the right elbow. Patient has comminuted patellar fracture. Consult Dr. Lutricia Horsfall who advises admission. Labs and EKG performed for preoperative reasons. The patient agreeable with plan, no evidence of injury aside from isolated left knee injury. Neurovascularly intact. ____________________________________________   FINAL CLINICAL IMPRESSION(S) / ED DIAGNOSES  Final diagnoses:  Left patella fracture, closed, initial encounter      Sharyn Creamer, MD 01/11/15 [phone]

## 2015-01-09 NOTE — Consult Note (Addendum)
ORTHOPAEDIC NOTE  (TO SERVE AS HISTORY AND PHYSICAL SINCE PATIENT WAS EVENTUALLY ADMITTED TO MY SERVICE)  PATIENT NAME: Spencer Coleman DOB: November 10, 1930  MRN: [id number]  Chief Complaint: Left knee pain  HPI: Spencer Coleman is a 81 y.o. male who complains of  81 yo male tripped on a pipe and fell forward, landing directly on the anterior aspect of his left knee. He had severe pain and had significant difficulty with attempts at standing or ambulating. He denied any loss of consciousness or other injuries. He was ambulating independently prior to the fall.  Past Medical History  Diagnosis Date  . CHF (congestive heart failure) (HCC)     nonischemic cardiomyopathy  . CKD (chronic kidney disease)   . CAD (coronary artery disease)     Nonobstructive cardiac disease by cath  . Ventricular tachycardia (HCC)     s/p ICD  . Hypertension   . Hyperlipidemia    Past Surgical History  Procedure Laterality Date  . Pacemaker insertion    . Cardiac defibrillator placement    . Cardiac catheterization    . Orif left wrist fracture    . Inguinal hernia repair    . Lithotripsy     Social History   Social History  . Marital Status: Single    Spouse Name: N/A  . Number of Children: N/A  . Years of Education: N/A   Occupational History  . retired    Social History Main Topics  . Smoking status: Never Smoker   . Smokeless tobacco: None  . Alcohol Use: No  . Drug Use: No  . Sexual Activity: Not Asked   Other Topics Concern  . None   Social History Narrative   Lives at home. Independent, no assisted devices at baseline   Family History  Problem Relation Age of Onset  . CAD Mother   . Kidney cancer Father    No Known Allergies Prior to Admission medications   Medication Sig Start Date End Date Taking? Authorizing Provider  amiodarone (PACERONE) 400 MG tablet Take 1 tablet by mouth at bedtime.  10/27/14  Yes Historical Provider, MD  furosemide (LASIX) 20 MG tablet Take 1  tablet by mouth daily. 10/27/14  Yes Historical Provider, MD  metoprolol (LOPRESSOR) 50 MG tablet Take 1 tablet by mouth 2 (two) times daily. 10/27/14  Yes Historical Provider, MD  simvastatin (ZOCOR) 40 MG tablet Take 1 tablet by mouth daily. 10/27/14  Yes Historical Provider, MD   Dg Chest Port 1 View  01/09/2015  CLINICAL DATA:  81 year old male - preoperative respiratory examination prior to knee surgery. EXAM: PORTABLE CHEST 1 VIEW COMPARISON:  10/27/2012 chest radiograph FINDINGS: The cardiomediastinal silhouette is unremarkable. A left-sided pacemaker/ ICD again noted. Elevation of the right hemidiaphragm is unchanged. There is no evidence of focal airspace disease, pulmonary edema, suspicious pulmonary nodule/mass, pleural effusion, or pneumothorax. No acute bony abnormalities are identified. IMPRESSION: No active disease. Electronically Signed   By: Harmon Pier M.D.   On: 01/09/2015 19:05   Dg Knee Complete 4 Views Left  01/09/2015  CLINICAL DATA:  Fall on concrete. Left patellar pain and abrasion. Initial encounter. EXAM: LEFT KNEE - COMPLETE 4+ VIEW COMPARISON:  None. FINDINGS: Comminuted fracture is seen involving the patella, with mild displacement of fracture fragments. Prominent prepatellar soft tissue swelling is seen as well as small lipohemarthrosis. No other fractures are identified. No evidence of dislocation. Peripheral vascular calcification noted. IMPRESSION: Comminuted patellar fracture. Associated small lipohemarthrosis and soft tissue  swelling. Electronically Signed   By: Myles RosenthalJohn  Stahl M.D.   On: 01/09/2015 17:39    Positive ROS: All other systems have been reviewed and were otherwise negative with the exception of those mentioned in the HPI and as above.  Physical Exam: General: Alert and alert in no acute distress. HEENT: Atraumatic and normocephalic. Sclera are clear. Extraocular motion is intact. Oropharynx is clear with moist mucosa. Neck: Supple, nontender, good range  of motion. No JVD or carotid bruits. Lungs: Clear to auscultation bilaterally. Cardiovascular: Regular rate and rhythm with normal S1 and S2. No murmurs. No gallops or rubs. Pedal pulses are faint but palpable bilaterally. Feet are dusky and cool to the touch. Homans test is negative bilaterally. No significant pretibial or ankle edema. Abdomen: Soft, nontender, and nondistended. Bowel sounds are present. Skin: Mild ecchymosis and superficial abrasion to the anterior left knee. Ecchymosis to the right elbow. Neurologic: Awake, alert, and oriented. Sensory function is grossly intact. Motor strength is felt to be 5 over 5 bilaterally. No clonus or tremor. Good motor coordination. Lymphatic: No axillary or cervical lymphadenopathy  MUSCULOSKELETAL: Examination of the left knee demonstrates a large effusion and significant soft tissue swelling. Marked tenderness to palpation along the patella. Knee is stable to varus and valgus stress. Difficult to assess flexion due to guarding.  Assessment: Comminuted left patella fracture   Plan: Recommend ORIF of the left patella fracture.  The findings were discussed in detail with the patient. The usual perioperative course was discussed. The risks and benefits of surgical intervention were reviewed. The patient expressed understanding of the risks and benefits and agreed with plans for surgical intervention.   The surgical site was signed as per the "right site surgery" protocol.   Gaynel Schaafsma P. Angie FavaHooten, Jr. M.D.

## 2015-01-10 ENCOUNTER — Inpatient Hospital Stay: Payer: Medicare HMO | Admitting: Anesthesiology

## 2015-01-10 ENCOUNTER — Inpatient Hospital Stay: Payer: Medicare HMO

## 2015-01-10 ENCOUNTER — Encounter
Admission: EM | Disposition: A | Discharge status: Skilled Nursing Facility | Payer: Self-pay | Source: Home / Self Care | Attending: Orthopedic Surgery

## 2015-01-10 ENCOUNTER — Encounter: Payer: Self-pay | Admitting: Anesthesiology

## 2015-01-10 HISTORY — PX: ORIF PATELLA: SHX5033

## 2015-01-10 LAB — CBC
HEMATOCRIT: 42 % (ref 40.0–52.0)
HEMOGLOBIN: 13.9 g/dL (ref 13.0–18.0)
MCH: 29 pg (ref 26.0–34.0)
MCHC: 33 g/dL (ref 32.0–36.0)
MCV: 87.8 fL (ref 80.0–100.0)
Platelets: 123 10*3/uL — ABNORMAL LOW (ref 150–440)
RBC: 4.78 MIL/uL (ref 4.40–5.90)
RDW: 14 % (ref 11.5–14.5)
WBC: 6.9 10*3/uL (ref 3.8–10.6)

## 2015-01-10 LAB — BASIC METABOLIC PANEL
Anion gap: 5 (ref 5–15)
BUN: 25 mg/dL — ABNORMAL HIGH (ref 6–20)
CHLORIDE: 108 mmol/L (ref 101–111)
CO2: 28 mmol/L (ref 22–32)
Calcium: 9 mg/dL (ref 8.9–10.3)
Creatinine, Ser: 1.31 mg/dL — ABNORMAL HIGH (ref 0.61–1.24)
GFR calc Af Amer: 57 mL/min — ABNORMAL LOW (ref >60)
GFR, EST NON AFRICAN AMERICAN: 49 mL/min — AB (ref >60)
Glucose, Bld: 106 mg/dL — ABNORMAL HIGH (ref 65–99)
POTASSIUM: 4.2 mmol/L (ref 3.5–5.1)
SODIUM: 141 mmol/L (ref 135–145)

## 2015-01-10 LAB — MRSA PCR SCREENING: MRSA by PCR: NEGATIVE

## 2015-01-10 SURGERY — OPEN REDUCTION INTERNAL FIXATION (ORIF) PATELLA
Anesthesia: General | Laterality: Left

## 2015-01-10 MED ORDER — ONDANSETRON HCL 4 MG PO TABS: 4.0000 mg | ORAL_TABLET | Freq: Four times a day (QID) | ORAL | Status: DC | PRN

## 2015-01-10 MED ORDER — LACTATED RINGERS IV SOLN
INTRAVENOUS | Status: DC | PRN
  Administered 2015-01-10 (×2): via INTRAVENOUS

## 2015-01-10 MED ORDER — FENTANYL CITRATE (PF) 100 MCG/2ML IJ SOLN
INTRAMUSCULAR | Status: AC
  Filled 2015-01-10: qty 2

## 2015-01-10 MED ORDER — FLEET ENEMA 7-19 GM/118ML RE ENEM: 1.0000 | ENEMA | Freq: Once | RECTAL | Status: DC | PRN

## 2015-01-10 MED ORDER — SENNOSIDES-DOCUSATE SODIUM 8.6-50 MG PO TABS: 1.0000 | ORAL_TABLET | Freq: Two times a day (BID) | ORAL | Status: DC

## 2015-01-10 MED ORDER — METOCLOPRAMIDE HCL 10 MG PO TABS: 10.0000 mg | ORAL_TABLET | Freq: Three times a day (TID) | ORAL | Status: DC

## 2015-01-10 MED ORDER — ENOXAPARIN SODIUM 30 MG/0.3ML ~~LOC~~ SOLN
30.0000 mg | Freq: Two times a day (BID) | SUBCUTANEOUS | Status: DC
  Administered 2015-01-11 – 2015-01-14 (×7): 30 mg via SUBCUTANEOUS
  Filled 2015-01-10 (×7): qty 0.3

## 2015-01-10 MED ORDER — FENTANYL CITRATE (PF) 100 MCG/2ML IJ SOLN
25.0000 ug | INTRAMUSCULAR | Status: DC | PRN
  Administered 2015-01-10 (×3): 25 ug via INTRAVENOUS

## 2015-01-10 MED ORDER — CEFAZOLIN SODIUM-DEXTROSE 2-3 GM-% IV SOLR
2.0000 g | Freq: Four times a day (QID) | INTRAVENOUS | Status: AC
  Administered 2015-01-10 – 2015-01-11 (×4): 2 g via INTRAVENOUS
  Filled 2015-01-10 (×4): qty 50

## 2015-01-10 MED ORDER — AMIODARONE HCL 200 MG PO TABS
400.0000 mg | ORAL_TABLET | Freq: Every day | ORAL | Status: DC
  Administered 2015-01-10 – 2015-01-11 (×2): 400 mg via ORAL
  Filled 2015-01-10 (×2): qty 2

## 2015-01-10 MED ORDER — ACETAMINOPHEN 650 MG RE SUPP: 650.0000 mg | Freq: Four times a day (QID) | RECTAL | Status: DC | PRN

## 2015-01-10 MED ORDER — BUPIVACAINE-EPINEPHRINE (PF) 0.25% -1:200000 IJ SOLN
INTRAMUSCULAR | Status: AC
  Filled 2015-01-10: qty 30

## 2015-01-10 MED ORDER — FUROSEMIDE 20 MG PO TABS: 20.0000 mg | ORAL_TABLET | Freq: Every day | ORAL | Status: DC

## 2015-01-10 MED ORDER — MORPHINE SULFATE (PF) 2 MG/ML IV SOLN
2.0000 mg | INTRAVENOUS | Status: DC | PRN
  Administered 2015-01-12: 2 mg via INTRAVENOUS
  Filled 2015-01-10: qty 1

## 2015-01-10 MED ORDER — ACETAMINOPHEN 10 MG/ML IV SOLN
1000.0000 mg | Freq: Four times a day (QID) | INTRAVENOUS | Status: AC
  Administered 2015-01-10 – 2015-01-11 (×3): 1000 mg via INTRAVENOUS
  Filled 2015-01-10 (×4): qty 100

## 2015-01-10 MED ORDER — ALUM & MAG HYDROXIDE-SIMETH 200-200-20 MG/5ML PO SUSP: 30.0000 mL | ORAL | Status: DC | PRN

## 2015-01-10 MED ORDER — DIPHENHYDRAMINE HCL 12.5 MG/5ML PO ELIX: 12.5000 mg | ORAL_SOLUTION | ORAL | Status: DC | PRN

## 2015-01-10 MED ORDER — ACETAMINOPHEN 325 MG PO TABS: 650.0000 mg | ORAL_TABLET | Freq: Four times a day (QID) | ORAL | Status: DC | PRN

## 2015-01-10 MED ORDER — SODIUM CHLORIDE 0.9 % IV SOLN
INTRAVENOUS | Status: DC
  Administered 2015-01-10 – 2015-01-11 (×2): via INTRAVENOUS

## 2015-01-10 MED ORDER — ONDANSETRON HCL 4 MG/2ML IJ SOLN: 4.0000 mg | Freq: Once | INTRAMUSCULAR | Status: DC | PRN

## 2015-01-10 MED ORDER — TRAMADOL HCL 50 MG PO TABS
50.0000 mg | ORAL_TABLET | ORAL | Status: DC | PRN
  Administered 2015-01-11 – 2015-01-12 (×2): 50 mg via ORAL
  Filled 2015-01-10 (×2): qty 1

## 2015-01-10 MED ORDER — MIDAZOLAM HCL 2 MG/2ML IJ SOLN
INTRAMUSCULAR | Status: DC | PRN
  Administered 2015-01-10: 2 mg via INTRAVENOUS

## 2015-01-10 MED ORDER — PHENYLEPHRINE HCL 10 MG/ML IJ SOLN
INTRAMUSCULAR | Status: DC | PRN
  Administered 2015-01-10 (×4): 200 ug via INTRAVENOUS
  Administered 2015-01-10 (×2): 100 ug via INTRAVENOUS
  Administered 2015-01-10: 200 ug via INTRAVENOUS

## 2015-01-10 MED ORDER — BUPIVACAINE-EPINEPHRINE 0.25% -1:200000 IJ SOLN
INTRAMUSCULAR | Status: DC | PRN
  Administered 2015-01-10: 10 mL

## 2015-01-10 MED ORDER — BISACODYL 10 MG RE SUPP: 10.0000 mg | Freq: Every day | RECTAL | Status: DC | PRN

## 2015-01-10 MED ORDER — ONDANSETRON HCL 4 MG/2ML IJ SOLN: 4.0000 mg | Freq: Four times a day (QID) | INTRAMUSCULAR | Status: DC | PRN

## 2015-01-10 MED ORDER — OXYCODONE HCL 5 MG PO TABS
5.0000 mg | ORAL_TABLET | ORAL | Status: DC | PRN
  Administered 2015-01-12: 10 mg via ORAL
  Administered 2015-01-12: 5 mg via ORAL

## 2015-01-10 MED ORDER — ACETAMINOPHEN 10 MG/ML IV SOLN
INTRAVENOUS | Status: AC
  Filled 2015-01-10: qty 100

## 2015-01-10 MED ORDER — PROPOFOL 10 MG/ML IV BOLUS
INTRAVENOUS | Status: DC | PRN
  Administered 2015-01-10: 120 mg via INTRAVENOUS

## 2015-01-10 MED ORDER — PANTOPRAZOLE SODIUM 40 MG PO TBEC: 40.0000 mg | DELAYED_RELEASE_TABLET | Freq: Two times a day (BID) | ORAL | Status: DC

## 2015-01-10 MED ORDER — FERROUS SULFATE 325 (65 FE) MG PO TABS
325.0000 mg | ORAL_TABLET | Freq: Two times a day (BID) | ORAL | Status: DC
  Administered 2015-01-10 – 2015-01-14 (×7): 325 mg via ORAL
  Filled 2015-01-10 (×8): qty 1

## 2015-01-10 MED ORDER — FENTANYL CITRATE (PF) 100 MCG/2ML IJ SOLN
INTRAMUSCULAR | Status: DC | PRN
  Administered 2015-01-10 (×2): 100 ug via INTRAVENOUS

## 2015-01-10 MED ORDER — PHENOL 1.4 % MT LIQD
1.0000 | OROMUCOSAL | Status: DC | PRN
  Filled 2015-01-10: qty 177

## 2015-01-10 MED ORDER — MAGNESIUM HYDROXIDE 400 MG/5ML PO SUSP: 30.0000 mL | Freq: Every day | ORAL | Status: DC | PRN

## 2015-01-10 MED ORDER — NEOMYCIN-POLYMYXIN B GU 40-200000 IR SOLN
Status: DC | PRN
  Administered 2015-01-10: 4 mL

## 2015-01-10 MED ORDER — MENTHOL 3 MG MT LOZG
1.0000 | LOZENGE | OROMUCOSAL | Status: DC | PRN
  Filled 2015-01-10: qty 9

## 2015-01-10 SURGICAL SUPPLY — 51 items
BANDAGE ELASTIC 6 LF NS (GAUZE/BANDAGES/DRESSINGS) ×3 IMPLANT
BIT DRILL 2.5X125 (BIT) ×3 IMPLANT
BLADE SURG SZ10 CARB STEEL (BLADE) ×6 IMPLANT
BNDG COHESIVE 4X5 TAN STRL (GAUZE/BANDAGES/DRESSINGS) ×3 IMPLANT
BRACE KNEE POST OP SHORT (BRACE) ×3 IMPLANT
CANISTER SUCT 1200ML W/VALVE (MISCELLANEOUS) ×3 IMPLANT
CAP PROTECT 1.6MM WIRE (Cap) ×3 IMPLANT
CAST PADDING 6X4YD ST 30248 (SOFTGOODS) ×2
CATH IV ANGIO 16GX3.25 GREY (CATHETERS) IMPLANT
CHLORAPREP W/TINT 26ML (MISCELLANEOUS) ×6 IMPLANT
DRAPE C-ARM XRAY 36X54 (DRAPES) ×3 IMPLANT
DRAPE C-ARMOR (DRAPES) ×3 IMPLANT
DRAPE INCISE IOBAN 66X45 STRL (DRAPES) ×3 IMPLANT
DRAPE U-SHAPE 47X51 STRL (DRAPES) ×3 IMPLANT
DRSG OPSITE POSTOP 4X8 (GAUZE/BANDAGES/DRESSINGS) ×6 IMPLANT
ELECT CAUTERY BLADE 6.4 (BLADE) ×3 IMPLANT
GAUZE PETRO XEROFOAM 1X8 (MISCELLANEOUS) ×3 IMPLANT
GAUZE SPONGE 4X4 12PLY STRL (GAUZE/BANDAGES/DRESSINGS) ×3 IMPLANT
GAUZE XEROFORM 4X4 STRL (GAUZE/BANDAGES/DRESSINGS) ×3 IMPLANT
GLOVE SURG ORTHO 9.0 STRL STRW (GLOVE) IMPLANT
GOWN SPECIALTY ULTRA XL (MISCELLANEOUS) IMPLANT
GOWN STRL REUS W/ TWL LRG LVL3 (GOWN DISPOSABLE) ×1 IMPLANT
GOWN STRL REUS W/TWL LRG LVL3 (GOWN DISPOSABLE) ×2
HANDLE YANKAUER SUCT BULB TIP (MISCELLANEOUS) ×3 IMPLANT
HEMOVAC 400CC 10FR (MISCELLANEOUS) IMPLANT
IMMOB KNEE 24 THIGH 24 443303 (SOFTGOODS) ×3 IMPLANT
IV CATH ANGIO 16GX3.25 GREY (CATHETERS)
NS IRRIG 500ML POUR BTL (IV SOLUTION) ×3 IMPLANT
PACK EXTREMITY ARMC (MISCELLANEOUS) ×3 IMPLANT
PAD ABD DERMACEA PRESS 5X9 (GAUZE/BANDAGES/DRESSINGS) ×3 IMPLANT
PAD CAST CTTN 4X4 STRL (SOFTGOODS) ×1 IMPLANT
PAD GROUND ADULT SPLIT (MISCELLANEOUS) ×3 IMPLANT
PADDING CAST COTTON 4X4 STRL (SOFTGOODS) ×2
PADDING CAST COTTON 6X4 ST (SOFTGOODS) ×1 IMPLANT
REAMER ROD DEEP FLUTE 2.5X950 (INSTRUMENTS) ×3 IMPLANT
SPONGE LAP 18X18 5 PK (GAUZE/BANDAGES/DRESSINGS) ×3 IMPLANT
STAPLER SKIN PROX 35W (STAPLE) ×3 IMPLANT
STOCKINETTE M/LG 89821 (MISCELLANEOUS) ×3 IMPLANT
STRAP SAFETY BODY (MISCELLANEOUS) ×3 IMPLANT
SUT ETHIBOND CT1 BRD 2-0 30IN (SUTURE) ×3 IMPLANT
SUT FIBERWIRE #5 38 CONV BLUE (SUTURE)
SUT ORTHOCORD W/MULTIPK NDL (SUTURE) IMPLANT
SUT STEEL 7 (SUTURE) IMPLANT
SUT VIC AB 0 CT1 27 (SUTURE) ×2
SUT VIC AB 0 CT1 27XCR 8 STRN (SUTURE) ×1 IMPLANT
SUT VIC AB 0 CT1 36 (SUTURE) ×3 IMPLANT
SUT VIC AB 2-0 CT1 27 (SUTURE) ×2
SUT VIC AB 2-0 CT1 TAPERPNT 27 (SUTURE) ×1 IMPLANT
SUTURE FIBERWR #5 38 CONV BLUE (SUTURE) IMPLANT
SYRINGE 10CC LL (SYRINGE) ×3 IMPLANT
TRAY FOLEY W/METER SILVER 16FR (SET/KITS/TRAYS/PACK) ×3 IMPLANT

## 2015-01-10 NOTE — Anesthesia Preprocedure Evaluation (Addendum)
Anesthesia Evaluation  Patient identified by MRN, date of birth, ID band Patient awake    Reviewed: Allergy & Precautions, H&P , NPO status , Patient's Chart, lab work & pertinent test results, reviewed documented beta blocker date and time   Airway Mallampati: III  TM Distance: >3 FB Neck ROM: full    Dental no notable dental hx. (+) Edentulous Upper, Edentulous Lower   Pulmonary neg pulmonary ROS,    Pulmonary exam normal breath sounds clear to auscultation       Cardiovascular Exercise Tolerance: Good hypertension, On Medications and On Home Beta Blockers (-) angina+ CAD, + Past MI and +CHF  (-) Cardiac Stents and (-) CABG Normal cardiovascular exam+ dysrhythmias Ventricular Tachycardia + pacemaker + Cardiac Defibrillator (-) Valvular Problems/Murmurs Rhythm:regular Rate:Normal     Neuro/Psych negative neurological ROS  negative psych ROS   GI/Hepatic negative GI ROS, Neg liver ROS,   Endo/Other  negative endocrine ROS  Renal/GU CRFRenal disease  negative genitourinary   Musculoskeletal   Abdominal   Peds  Hematology negative hematology ROS (+)   Anesthesia Other Findings Past Medical History:   CHF (congestive heart failure) (HCC)                           Comment:nonischemic cardiomyopathy   CKD (chronic kidney disease)                                 CAD (coronary artery disease)                                  Comment:Nonobstructive cardiac disease by cath   Ventricular tachycardia (HCC)                                  Comment:s/p ICD   Hypertension                                                 Hyperlipidemia                                              Patient cleared for surgery by both medicine and cardiology, please see notes.  Reproductive/Obstetrics negative OB ROS                            Anesthesia Physical Anesthesia Plan  ASA: IV  Anesthesia Plan: General    Post-op Pain Management:    Induction:   Airway Management Planned:   Additional Equipment:   Intra-op Plan:   Post-operative Plan:   Informed Consent: I have reviewed the patients History and Physical, chart, labs and discussed the procedure including the risks, benefits and alternatives for the proposed anesthesia with the patient or authorized representative who has indicated his/her understanding and acceptance.   Dental Advisory Given  Plan Discussed with: Anesthesiologist, CRNA and Surgeon  Anesthesia Plan Comments:         Anesthesia Quick Evaluation

## 2015-01-10 NOTE — Consult Note (Signed)
[facility]  CARDIOLOGY CONSULT NOTE  Patient ID: Spencer Coleman MRN: [id number] DOB/AGE: 79/08/1930 79 y.o.  Admit date: 01/09/2015 Referring Physician Dr. Ernest PineHooten Primary Physician   Primary Cardiologist Dr. Darrold JunkerParaschos Reason for Consultation Preop clearance  HPI: 81 yo male with history of nonischemic cardiomyopathy with ef of 40% with a BiV AICD in place which is being closely followed by Dr. Darrold JunkerParaschos. He was admitted with a patellar fracture after a mechanical fall tripping over a pipe. He denies chest pain or sob. He has been compliant with his meds including amiodarone 400 mg daily and metoprolol tartrate 50 mg bid. He is on lasix for volume control. He has no clinical evidence of chf. EKG is biv paced. CXR shows no chf or active disease. He has noncritical coronary artery disease.   ROS Review of Systems - History obtained from chart review and the patient General ROS: positive for  - left knee pain Respiratory ROS: no cough, shortness of breath, or wheezing Cardiovascular ROS: no chest pain or dyspnea on exertion Gastrointestinal ROS: no abdominal pain, change in bowel habits, or black or bloody stools Musculoskeletal ROS: positive for - joint pain Neurological ROS: no TIA or stroke symptoms   Past Medical History  Diagnosis Date  . CHF (congestive heart failure) (HCC)     nonischemic cardiomyopathy  . CKD (chronic kidney disease)   . CAD (coronary artery disease)     Nonobstructive cardiac disease by cath  . Ventricular tachycardia (HCC)     s/p ICD  . Hypertension   . Hyperlipidemia     Family History  Problem Relation Age of Onset  . CAD Mother   . Kidney cancer Father     Social History   Social History  . Marital Status: Single    Spouse Name: N/A  . Number of Children: N/A  . Years of Education: N/A   Occupational History  . retired    Social History Main Topics  . Smoking status: Never Smoker   . Smokeless  tobacco: Not on file  . Alcohol Use: No  . Drug Use: No  . Sexual Activity: Not on file   Other Topics Concern  . Not on file   Social History Narrative   Lives at home. Independent, no assisted devices at baseline    Past Surgical History  Procedure Laterality Date  . Pacemaker insertion    . Cardiac defibrillator placement    . Cardiac catheterization    . Orif left wrist fracture    . Inguinal hernia repair    . Lithotripsy       Prescriptions prior to admission  Medication Sig Dispense Refill Last Dose  . amiodarone (PACERONE) 400 MG tablet Take 1 tablet by mouth at bedtime.    01/08/2015 at Unknown time  . furosemide (LASIX) 20 MG tablet Take 1 tablet by mouth daily.   01/09/2015 at Unknown time  . metoprolol (LOPRESSOR) 50 MG tablet Take 1 tablet by mouth 2 (two) times daily.   01/09/2015 at Unknown time  . simvastatin (ZOCOR) 40 MG tablet Take 1 tablet by mouth daily.   01/09/2015 at Unknown time    Physical Exam: Blood pressure 128/95, pulse 67, temperature 97.4 F (36.3 C), temperature source Oral, resp. rate 16, height 5\' 10"  (1.778 m), weight 91.899 kg (202 lb 9.6 oz), SpO2 100 %.   General appearance: alert and cooperative Head: Normocephalic, without obvious abnormality, atraumatic Resp: clear to auscultation bilaterally Cardio:  regular rate and rhythm GI: soft, non-tender; bowel sounds normal; no masses,  no organomegaly Extremities: left patella fx Pulses: 2+ and symmetric Neurologic: Grossly normal Labs:   Lab Results  Component Value Date   WBC 6.9 01/10/2015   HGB 13.9 01/10/2015   HCT 42.0 01/10/2015   MCV 87.8 01/10/2015   PLT 123* 01/10/2015    Recent Labs Lab 01/10/15 0406  NA 141  K 4.2  CL 108  CO2 28  BUN 25*  CREATININE 1.31*  CALCIUM 9.0  GLUCOSE 106*   Lab Results  Component Value Date   TROPONINI < 0.02 10/27/2012      Radiology: cxr shows no chf EKG: biv paced  ASSESSMENT AND PLAN:  81 yo male with history of  nonischemic cardiomyopathy with ef of 40%, with biv aicd in place. No arrythmia. Is treated with amiodarone 400 daily, metoprolol tartrate 50 bid. Pt is well compensated. No evidence of ischemia or chf. Continue with with these meds during the perioperative period. Pt is well optimized for surgery. WOuld proceed with surgery with routine cardiac monitoring. Would place magnet over aicd device during surgery and remove magnet postop. Will assist post op as needed.  Signed: Dalia Heading MD, [facility] 01/10/2015, 8:51 AM

## 2015-01-10 NOTE — Anesthesia Postprocedure Evaluation (Signed)
Anesthesia Post Note  Patient: Spencer Coleman  Procedure(s) Performed: Procedure(s) (LRB): OPEN REDUCTION INTERNAL (ORIF) FIXATION PATELLA (Left)  Patient location during evaluation: PACU Anesthesia Type: General Level of consciousness: awake and alert Pain management: pain level controlled Vital Signs Assessment: post-procedure vital signs reviewed and stable Respiratory status: spontaneous breathing, nonlabored ventilation, respiratory function stable and patient connected to nasal cannula oxygen Cardiovascular status: blood pressure returned to baseline and stable Postop Assessment: no signs of nausea or vomiting Anesthetic complications: no    Last Vitals:  Filed Vitals:   01/10/15 1431 01/10/15 1459  BP: 129/66 128/62  Pulse: 59 59  Temp:  35.6 C  Resp:  18    Last Pain:  Filed Vitals:   01/10/15 1500  PainSc: 1                  Lenard SimmerAndrew Graciana Sessa

## 2015-01-10 NOTE — Op Note (Signed)
OPERATIVE NOTE  DATE OF SURGERY:  01/09/2015 - 01/10/2015  PATIENT NAME:  Spencer Coleman   DOB: 06/26/1930  MRN: [id number]  PRE-OPERATIVE DIAGNOSIS: Fracture of the left patella  POST-OPERATIVE DIAGNOSIS:  Same  PROCEDURE:  Open reduction and internal fixation of the left patella  SURGEON:  Jena GaussJames P Ilir Mahrt, Jr. M.D.  ASSISTANT:  None  ANESTHESIA: general  ESTIMATED BLOOD LOSS: 50 mL  FLUIDS REPLACED: 1200 mL of crystalloid  TOURNIQUET TIME: 90 minutes  DRAINS: None  IMPLANTS UTILIZED: 2 - 2.0 mm K wires, 1.6 mm wire  INDICATIONS FOR SURGERY: Spencer Critchleyra Roland Beaubrun is a 81 y.o. year old male who fell and sustained a fracture of the left patella. After discussion of the risks and benefits of surgical intervention, the patient expressed understanding of the risks benefits and agree with plans for open reduction and internal fixation of the fracture.   The risks, benefits, and alternatives were discussed at length including but not limited to the risks of infection, bleeding, nerve injury, stiffness, blood clots, the need for revision surgery, cardiopulmonary complications, among others, and they were willing to proceed.  PROCEDURE IN DETAIL: The patient was brought into the operating room and, after adequate general anesthesia was achieved, a tourniquet was placed on the patient's upper thigh. The patient's knee and leg were cleaned and prepped with alcohol and DuraPrep and draped in the usual sterile fashion. A "timeout" was performed as per usual protocol. The lower extremity was exsanguinated using an Esmarch, and the tourniquet was inflated to 300 mmHg. An anterior longitudinal incision was made and dissection was carried down to the retinaculum. A hemarthrosis was evacuated and the joint was irrigated with copious amounts of normal saline with antibiotic solution. The fracture site was carefully debrided of hematoma and soft tissue. Moderate comminution was noted at the fracture site  and several small fragments were removed. Two 2.0 mm K wires were inserted in a parallel fashion through the proximal fragment. Next, a provisional reduction was performed and maintained using bone reduction forceps with points. A larger lateral fragment was keyed into the construct between the superior and inferior fragments. Position and reduction was confirmed using C-arm. The K wires were then directed through the inferior patellar fragment. Again, reduction and position of K wires was confirmed using the C-arm. A 14-gauge Angiocath catheter was passed behind the K wires through the quadriceps tendon and a 1.6 mm wire was passed. The wire was then adjusted to form a figure-of-eight pattern and the Angiocath was used to pass the wire under the K wires along the inferior pole of the patella. The wire was tensioned and then twisted. Excellent compression at the fracture site was appreciated. The proximal portion of the K wires was then bent and impacted so as to engage the wire. The distal portion of the K wires were then bent slightly and cut distal to the tension band position was again confirmed with reduction verified using the C-arm in both AP and lateral planes. Tourniquet was deflated after total tourniquet time of 90 minutes. Hemostasis was achieved using electrocautery. The retinaculum was repaired using #1 Ethibond. Subcutaneous tissue was approximated in layers using first #0 Vicryl followed by #2-0 Vicryl. Skin was closed with skin staples. 0.25% Marcaine with epinephrine was injected along the incision site. A sterile dressing was applied followed by application of a knee range of motion brace which was locked in extension.  The patient tolerated the procedure well and was transported to the recovery  room in stable condition.  Valina Maes P. Angie Fava M.D.

## 2015-01-10 NOTE — Brief Op Note (Signed)
01/09/2015 - 01/10/2015  1:33 PM  PATIENT:  Spencer CritchleyIra Roland Coleman  81 y.o. male  PRE-OPERATIVE DIAGNOSIS:  left patella fracture, comminuted  POST-OPERATIVE DIAGNOSIS:  same  PROCEDURE:  Procedure(s): OPEN REDUCTION INTERNAL (ORIF) FIXATION PATELLA (Left)  SURGEON:  Surgeon(s) and Role:    * Donato HeinzJames P Cecily Lawhorne, MD - Primary  ASSISTANTS: none   ANESTHESIA:   general  EBL:  Total I/O In: 1200 [I.V.:1200] Out: 350 [Urine:300; Blood:50]  BLOOD ADMINISTERED:none  DRAINS: none   LOCAL MEDICATIONS USED:  MARCAINE     SPECIMEN:  No Specimen  DISPOSITION OF SPECIMEN:  N/A  COUNTS:  YES  TOURNIQUET:   Total Tourniquet Time Documented: Calf (Left) - 90 minutes Total: Calf (Left) - 90 minutes   DICTATION: .Reubin Milanragon Dictation  PLAN OF CARE: Admit to inpatient   PATIENT DISPOSITION:  PACU - hemodynamically stable.   Delay start of Pharmacological VTE agent (>24hrs) due to surgical blood loss or risk of bleeding: yes

## 2015-01-10 NOTE — Transfer of Care (Signed)
Immediate Anesthesia Transfer of Care Note  Patient: Spencer Coleman  Procedure(s) Performed: Procedure(s): OPEN REDUCTION INTERNAL (ORIF) FIXATION PATELLA (Left)  Patient Location: PACU  Anesthesia Type:General  Level of Consciousness: awake  Airway & Oxygen Therapy: Patient Spontanous Breathing  Post-op Assessment: Report given to RN  Post vital signs: Reviewed  Last Vitals:  Filed Vitals:   01/10/15 0421 01/10/15 0746  BP: 165/81 128/95  Pulse: 66 67  Temp: 36.7 C 36.3 C  Resp: 18 16    Complications: No apparent anesthesia complications

## 2015-01-10 NOTE — OR Nursing (Signed)
Foley removed per Dr. Ernest PineHooten

## 2015-01-10 NOTE — Progress Notes (Signed)
[facility] [place] [facility]   PATIENT NAME: Spencer Coleman    MR#:  [id number]  DATE OF BIRTH:  05/22/1930  SUBJECTIVE:Admitted for left patellar fracture.has knee pain but no cardiac symptoms.  CHIEF COMPLAINT:   Chief Complaint  Patient presents with  . Fall  . Knee Pain    REVIEW OF SYSTEMS:   ROS CONSTITUTIONAL: No fever, fatigue or weakness.  EYES: No blurred or double vision.  EARS, NOSE, AND THROAT: No tinnitus or ear pain.  RESPIRATORY: No cough, shortness of breath, wheezing or hemoptysis.  CARDIOVASCULAR: No chest pain, orthopnea, edema.  GASTROINTESTINAL: No nausea, vomiting, diarrhea or abdominal pain.  GENITOURINARY: No dysuria, hematuria.  ENDOCRINE: No polyuria, nocturia,  HEMATOLOGY: No anemia, easy bruising or bleedi NEUROLOGIC: No tingling, numbness, weakness.  PSYCHIATRY: No anxiety or depression.  Musuloskeletal;left knee pain,swelling.  DRUG ALLERGIES:  No Known Allergies  VITALS:  Blood pressure 129/66, pulse 59, temperature 96.6 F (35.9 C), temperature source Oral, resp. rate 15, height 5\' 10"  (1.778 m), weight 91.899 kg (202 lb 9.6 oz), SpO2 100 %.  PHYSICAL EXAMINATION:  GENERAL:  81 y.o.-year-old patient lying in the bed with no acute distress.  EYES: Pupils equal, round, reactive to light and accommodation. No scleral icterus. Extraocular muscles intact.  HEENT: Head atraumatic, normocephalic. Oropharynx and nasopharynx clear.  NECK:  Supple, no jugular venous distention. No thyroid enlargement, no tenderness.  LUNGS: Normal breath sounds bilaterally, no wheezing, rales,rhonchi or crepitation. No use of accessory muscles of respiration.  CARDIOVASCULAR: S1, S2 normal. No murmurs, rubs, or gallops.  ABDOMEN: Soft, nontender, nondistended. Bowel sounds present. No organomegaly or mass.  EXTREMITIES: left  Knee  Swollen.Marland Kitchen.  NEUROLOGIC: Cranial nerves II through XII are intact. Muscle strength 5/5 in all extremities.  Sensation intact. Gait not checked.  PSYCHIATRIC: The patient is alert and oriented x 3.  SKIN: No obvious rash, lesion, or ulcer.    LABORATORY PANEL:   CBC  Recent Labs Lab 01/10/15 0406  WBC 6.9  HGB 13.9  HCT 42.0  PLT 123*   ------------------------------------------------------------------------------------------------------------------  Chemistries   Recent Labs Lab 01/10/15 0406  NA 141  K 4.2  CL 108  CO2 28  GLUCOSE 106*  BUN 25*  CREATININE 1.31*  CALCIUM 9.0   ------------------------------------------------------------------------------------------------------------------  Cardiac Enzymes No results for input(s): TROPONINI in the last 168 hours. ------------------------------------------------------------------------------------------------------------------  RADIOLOGY:  Dg Knee 1-2 Views Left  01/10/2015  CLINICAL DATA:  Operative patellar fracture. EXAM: LEFT KNEE - 1-2 VIEW; DG C-ARM 61-120 MIN COMPARISON:  01/09/2015 FINDINGS: Two images are submitted, demonstrating interval reduction and fixation of patellar fracture with pins and cerclage wires. IMPRESSION: ORIF patellar fracture. Electronically Signed   By: Norva PavlovElizabeth  Brown M.D.   On: 01/10/2015 13:01   Dg Chest Port 1 View  01/09/2015  CLINICAL DATA:  81 year old male - preoperative respiratory examination prior to knee surgery. EXAM: PORTABLE CHEST 1 VIEW COMPARISON:  10/27/2012 chest radiograph FINDINGS: The cardiomediastinal silhouette is unremarkable. A left-sided pacemaker/ ICD again noted. Elevation of the right hemidiaphragm is unchanged. There is no evidence of focal airspace disease, pulmonary edema, suspicious pulmonary nodule/mass, pleural effusion, or pneumothorax. No acute bony abnormalities are identified. IMPRESSION: No active disease. Electronically Signed   By: Harmon PierJeffrey  Hu M.D.   On: 01/09/2015 19:05   Dg Knee Complete 4 Views Left  01/09/2015  CLINICAL DATA:  Fall on concrete.  Left patellar pain and abrasion. Initial encounter. EXAM: LEFT KNEE - COMPLETE 4+ VIEW  COMPARISON:  None. FINDINGS: Comminuted fracture is seen involving the patella, with mild displacement of fracture fragments. Prominent prepatellar soft tissue swelling is seen as well as small lipohemarthrosis. No other fractures are identified. No evidence of dislocation. Peripheral vascular calcification noted. IMPRESSION: Comminuted patellar fracture. Associated small lipohemarthrosis and soft tissue swelling. Electronically Signed   By: Myles Rosenthal M.D.   On: 01/09/2015 17:39   Dg C-arm 61-120 Min  01/10/2015  CLINICAL DATA:  Operative patellar fracture. EXAM: LEFT KNEE - 1-2 VIEW; DG C-ARM 61-120 MIN COMPARISON:  01/09/2015 FINDINGS: Two images are submitted, demonstrating interval reduction and fixation of patellar fracture with pins and cerclage wires. IMPRESSION: ORIF patellar fracture. Electronically Signed   By: Norva Pavlov M.D.   On: 01/10/2015 13:01    EKG:   Orders placed or performed during the hospital encounter of 01/09/15  . ED EKG  . ED EKG  . EKG 12-Lead  . EKG 12-Lead    ASSESSMENT AND PLAN:  1.acute left patellar  Fracture.s/p surgery.continue pain meds,IS,PTas per Ortho 2.chronic systolic chf,h/o Non ischeic cardiomyopathy;EF 40%,continue lasix, 3.h/o v.tach;s/p ICD;continue amiodarone 4.htn;stable. 5.HLP;stable  All the records are reviewed and case discussed with Care Management/Social Workerr. Management plans discussed with the patient, family and they are in agreement.  CODE STATUS: full  TOTAL TIME TAKING CARE OF THIS PATIENT: .   POSSIBLE D/C IN 2-3DAYS, DEPENDING ON CLINICAL CONDITION.   Katha Hamming M.D on 01/10/2015 at 2:34 PM  Between 7am to 6pm - Pager - [phone]  After 6pm go to [email or web address] - password [email or web address]  [facility]  Office  [phone]  CC: Primary care physician; Barbette Reichmann, MD   Note: This  dictation was prepared with Dragon dictation along with smaller phrase technology. Any transcriptional errors that result from this process are unintentional.

## 2015-01-11 ENCOUNTER — Encounter: Payer: Self-pay | Admitting: Orthopedic Surgery

## 2015-01-11 LAB — BASIC METABOLIC PANEL
Anion gap: 4 — ABNORMAL LOW (ref 5–15)
BUN: 22 mg/dL — AB (ref 6–20)
CHLORIDE: 109 mmol/L (ref 101–111)
CO2: 26 mmol/L (ref 22–32)
CREATININE: 1.34 mg/dL — AB (ref 0.61–1.24)
Calcium: 8.5 mg/dL — ABNORMAL LOW (ref 8.9–10.3)
GFR calc Af Amer: 55 mL/min — ABNORMAL LOW (ref >60)
GFR calc non Af Amer: 48 mL/min — ABNORMAL LOW (ref >60)
GLUCOSE: 91 mg/dL (ref 65–99)
Potassium: 3.9 mmol/L (ref 3.5–5.1)
SODIUM: 139 mmol/L (ref 135–145)

## 2015-01-11 LAB — CBC
HEMATOCRIT: 35 % — AB (ref 40.0–52.0)
Hemoglobin: 11.7 g/dL — ABNORMAL LOW (ref 13.0–18.0)
MCH: 29.5 pg (ref 26.0–34.0)
MCHC: 33.5 g/dL (ref 32.0–36.0)
MCV: 88.1 fL (ref 80.0–100.0)
PLATELETS: 97 10*3/uL — AB (ref 150–440)
RBC: 3.97 MIL/uL — AB (ref 4.40–5.90)
RDW: 14 % (ref 11.5–14.5)
WBC: 5.1 10*3/uL (ref 3.8–10.6)

## 2015-01-11 NOTE — Progress Notes (Signed)
Appears to be doing well post op day 1 from cardiac standpoint. Continue current regimen.

## 2015-01-11 NOTE — Progress Notes (Signed)
[facility] [place] [facility]   PATIENT NAME: Spencer Coleman    MR#:  [id number]  DATE OF BIRTH:  04/08/1930  SUBJECTIVE:Admitted for left patellar fracture.status post surgery. Patient doing very well. No pain. No trouble breathing. No chest pain . ,  CHIEF COMPLAINT:   Chief Complaint  Patient presents with  . Fall  . Knee Pain    REVIEW OF SYSTEMS:   ROS CONSTITUTIONAL: No fever, fatigue or weakness.  EYES: No blurred or double vision.  EARS, NOSE, AND THROAT: No tinnitus or ear pain.  RESPIRATORY: No cough, shortness of breath, wheezing or hemoptysis.  CARDIOVASCULAR: No chest pain, orthopnea, edema.  GASTROINTESTINAL: No nausea, vomiting, diarrhea or abdominal pain.  GENITOURINARY: No dysuria, hematuria.  ENDOCRINE: No polyuria, nocturia,  HEMATOLOGY: No anemia, easy bruising or bleedi NEUROLOGIC: No tingling, numbness, weakness.  PSYCHIATRY: No anxiety or depression.  Musuloskeletal;left knee pain,swelling.  DRUG ALLERGIES:  No Known Allergies  VITALS:  Blood pressure 146/71, pulse 74, temperature 97.5 F (36.4 C), temperature source Oral, resp. rate 16, height 5\' 10"  (1.778 m), weight 91.899 kg (202 lb 9.6 oz), SpO2 97 %.  PHYSICAL EXAMINATION:  GENERAL:  82 y.o.-year-old patient lying in the bed with no acute distress.  EYES: Pupils equal, round, reactive to light and accommodation. No scleral icterus. Extraocular muscles intact.  HEENT: Head atraumatic, normocephalic. Oropharynx and nasopharynx clear.  NECK:  Supple, no jugular venous distention. No thyroid enlargement, no tenderness.  LUNGS: Normal breath sounds bilaterally, no wheezing, rales,rhonchi or crepitation. No use of accessory muscles of respiration.  CARDIOVASCULAR: S1, S2 normal. No murmurs, rubs, or gallops.  ABDOMEN: Soft, nontender, nondistended. Bowel sounds present. No organomegaly or mass.  EXTREMITIES: left  Knee dressing present  NEUROLOGIC: Cranial nerves II  through XII are intact. Muscle strength 5/5 in all extremities. Sensation intact. Gait not checked.  PSYCHIATRIC: The patient is alert and oriented x 3.  SKIN: No obvious rash, lesion, or ulcer.    LABORATORY PANEL:   CBC  Recent Labs Lab 01/11/15 0445  WBC 5.1  HGB 11.7*  HCT 35.0*  PLT 97*   ------------------------------------------------------------------------------------------------------------------  Chemistries   Recent Labs Lab 01/11/15 0445  NA 139  K 3.9  CL 109  CO2 26  GLUCOSE 91  BUN 22*  CREATININE 1.34*  CALCIUM 8.5*   ------------------------------------------------------------------------------------------------------------------  Cardiac Enzymes No results for input(s): TROPONINI in the last 168 hours. ------------------------------------------------------------------------------------------------------------------  RADIOLOGY:  Dg Knee 1-2 Views Left  01/10/2015  CLINICAL DATA:  Operative patellar fracture. EXAM: LEFT KNEE - 1-2 VIEW; DG C-ARM 61-120 MIN COMPARISON:  01/09/2015 FINDINGS: Two images are submitted, demonstrating interval reduction and fixation of patellar fracture with pins and cerclage wires. IMPRESSION: ORIF patellar fracture. Electronically Signed   By: Norva PavlovElizabeth  Brown M.D.   On: 01/10/2015 13:01   Dg Chest Port 1 View  01/09/2015  CLINICAL DATA:  82 year old male - preoperative respiratory examination prior to knee surgery. EXAM: PORTABLE CHEST 1 VIEW COMPARISON:  10/27/2012 chest radiograph FINDINGS: The cardiomediastinal silhouette is unremarkable. A left-sided pacemaker/ ICD again noted. Elevation of the right hemidiaphragm is unchanged. There is no evidence of focal airspace disease, pulmonary edema, suspicious pulmonary nodule/mass, pleural effusion, or pneumothorax. No acute bony abnormalities are identified. IMPRESSION: No active disease. Electronically Signed   By: Harmon PierJeffrey  Hu M.D.   On: 01/09/2015 19:05   Dg Knee Complete  4 Views Left  01/09/2015  CLINICAL DATA:  Fall on concrete. Left patellar pain and  abrasion. Initial encounter. EXAM: LEFT KNEE - COMPLETE 4+ VIEW COMPARISON:  None. FINDINGS: Comminuted fracture is seen involving the patella, with mild displacement of fracture fragments. Prominent prepatellar soft tissue swelling is seen as well as small lipohemarthrosis. No other fractures are identified. No evidence of dislocation. Peripheral vascular calcification noted. IMPRESSION: Comminuted patellar fracture. Associated small lipohemarthrosis and soft tissue swelling. Electronically Signed   By: Myles Rosenthal M.D.   On: 01/09/2015 17:39   Dg C-arm 61-120 Min  01/10/2015  CLINICAL DATA:  Operative patellar fracture. EXAM: LEFT KNEE - 1-2 VIEW; DG C-ARM 61-120 MIN COMPARISON:  01/09/2015 FINDINGS: Two images are submitted, demonstrating interval reduction and fixation of patellar fracture with pins and cerclage wires. IMPRESSION: ORIF patellar fracture. Electronically Signed   By: Norva Pavlov M.D.   On: 01/10/2015 13:01    EKG:   Orders placed or performed during the hospital encounter of 01/09/15  . ED EKG  . ED EKG  . EKG 12-Lead  . EKG 12-Lead    ASSESSMENT AND PLAN:  1.acute left patellar  Fracture.s/p surgery.continue pain meds,,PTas per Ortho, Lovenox., Physical therapy recommends home with home physical therapy.  2.chronic systolic chf,h/o Non ischeic cardiomyopathy;EF 40%,continue lasix, 3.h/o v.tach;s/p ICD;continue amiodarone, also on metoprolol. Heart rate in 70s.  4.htn;stable. 5.HLP;stable Sign off. Recall if needed.  All the records are reviewed and case discussed with Care Management/Social Workerr. Management plans discussed with the patient, family and they are in agreement.  CODE STATUS: full  TOTAL TIME TAKING CARE OF THIS PATIENT: .   POSSIBLE D/C IN 2-3DAYS, DEPENDING ON CLINICAL CONDITION.   Katha Hamming M.D on 01/11/2015 at 11:52 AM  Between 7am to 6pm  - Pager - [phone]  After 6pm go to [email or web address] - password [email or web address]  [facility]  Office  [phone]  CC: Primary care physician; Barbette Reichmann, MD   Note: This dictation was prepared with Dragon dictation along with smaller phrase technology. Any transcriptional errors that result from this process are unintentional.

## 2015-01-11 NOTE — Evaluation (Signed)
Physical Therapy Evaluation Patient Details Name: Spencer Coleman MRN: [id number] DOB: 04/29/1930 Today's Date: 01/11/2015   History of Present Illness  Pt admitted for falling on a pipe and landing on L knee. Pt sustained L knee patella fracture and is now s/p ORIF. Pt currently with brace locked in extension on L knee.   Clinical Impression  Pt is a pleasant 82 year old male who was admitted for L patella ORIF. Pt performs bed mobility with min assist and transfers/ambulation with cga and rw. Pt very motivated to perform therapy, however complains of dizziness with mobility. Reports this has been a chronic issue. He may benefit from OP follow up for vestibular therapy as it is affecting his daily function. Pt demonstrates deficits with strength/mobility. Would benefit from skilled PT to address above deficits and promote optimal return to PLOF.      Follow Up Recommendations Home health PT    Equipment Recommendations       Recommendations for Other Services       Precautions / Restrictions Precautions Precautions: Fall Required Braces or Orthoses:  (knee brace locked in extension) Restrictions Weight Bearing Restrictions: Yes LLE Weight Bearing: Weight bearing as tolerated      Mobility  Bed Mobility Overal bed mobility: Needs Assistance Bed Mobility: Supine to Sit     Supine to sit: Min assist     General bed mobility comments: safe technique, however sequencing cues required for getting out of bed and min assist for scooting towards EOB.  Transfers Overall transfer level: Needs assistance Equipment used: Rolling walker (2 wheeled) Transfers: Sit to/from Stand Sit to Stand: Min guard         General transfer comment: transfers performed with cues to push from seated surface. Once standing, pt able to perform WB on L LE with limited pain.  Ambulation/Gait Ambulation/Gait assistance: Min guard Ambulation Distance (Feet): 40 Feet Assistive device: Rolling  walker (2 wheeled) Gait Pattern/deviations: Step-to pattern     General Gait Details: ambulated with cues for obstacle avoidance using rw. Pt with good speed/endurance, however mentions dizziness with ambulation. No LOB noted during ambulation  Stairs            Wheelchair Mobility    Modified Rankin (Stroke Patients Only)       Balance Overall balance assessment: History of Falls;Needs assistance Sitting-balance support: Feet supported Sitting balance-Leahy Scale: Normal     Standing balance support: Bilateral upper extremity supported Standing balance-Leahy Scale: Good                               Pertinent Vitals/Pain Pain Assessment: No/denies pain    Home Living Family/patient expects to be discharged to:: Private residence Living Arrangements: Alone (has son who lives with him and can provide help) Available Help at Discharge: Family Type of Home: House Home Access: Level entry     Home Layout: One level Home Equipment: Environmental consultantWalker - 2 wheels;Cane - single point;Wheelchair - manual      Prior Function Level of Independence: Independent               Hand Dominance        Extremity/Trunk Assessment   Upper Extremity Assessment: Overall WFL for tasks assessed           Lower Extremity Assessment: LLE deficits/detail (able to SLR against gravity; grossly 3+/5)         Communication   Communication: No  difficulties  Cognition Arousal/Alertness: Awake/alert Behavior During Therapy: WFL for tasks assessed/performed Overall Cognitive Status: Within Functional Limits for tasks assessed                      General Comments      Exercises Other Exercises Other Exercises: Pt performed supine ther-ex including B LE ankle pumps, SLRs, and hip abd/add. All ther-ex performed x 10 reps with cga for assistance and cues for technique.      Assessment/Plan    PT Assessment Patient needs continued PT services  PT  Diagnosis Abnormality of gait   PT Problem List Decreased strength;Decreased knowledge of use of DME;Decreased mobility  PT Treatment Interventions Gait training;Therapeutic exercise   PT Goals (Current goals can be found in the Care Plan section) Acute Rehab PT Goals Patient Stated Goal: to go home PT Goal Formulation: With patient Time For Goal Achievement: 01/25/15 Potential to Achieve Goals: Good    Frequency BID   Barriers to discharge        Co-evaluation               End of Session Equipment Utilized During Treatment: Gait belt Activity Tolerance: Patient tolerated treatment well Patient left: in chair;with chair alarm set Nurse Communication: Mobility status         Time: 9604-5409 PT Time Calculation (min) (ACUTE ONLY): 18 min   Charges:   PT Evaluation $Initial PT Evaluation Tier I: 1 Procedure PT Treatments $Therapeutic Exercise: 8-22 mins   PT G Codes:        Shalia Bartko 01/27/15, 10:06 AM  Elizabeth Palau, PT, DPT [phone]

## 2015-01-11 NOTE — Progress Notes (Signed)
Pt asleep sats on telemetry monitor 54, pt wakened easily when name called, sats on box 97% after waking pt and placed on 1L of O2, will cont to monitor

## 2015-01-11 NOTE — Clinical Social Work Note (Signed)
Clinical Social Worker consulted for [facility]. PT recommended HHPT. CSW udpated RNCM, who will follow for discharge planning needs. CSW is signing off as no further needs identified.   Dede QuerySarah Chiante Peden, MSW, LCSW  Clinical Social Worker [phone]

## 2015-01-11 NOTE — Progress Notes (Signed)
Notified MD of possible duplicate order for amiodarone 400mg , Hower MD to dc one order and skip pm dose tonight due to receiving this am

## 2015-01-11 NOTE — Progress Notes (Signed)
   Subjective: 1 Day Post-Op Procedure(s) (LRB): OPEN REDUCTION INTERNAL (ORIF) FIXATION PATELLA (Left) Patient reports pain as 4 on 0-10 scale.   Patient is well, and has had no acute complaints or problems We will start therapy today.  Plan is  undecided as to rehabilitation or home  after hospital stay. we'll see how he does today with therapy  no nausea and no vomiting Patient denies any chest pains or shortness of breath.  Objective: Vital signs in last 24 hours: Temp:  [96 F (35.6 C)-98 F (36.7 C)] 98 F (36.7 C) (01/01 0801) Pulse Rate:  [58-68] 62 (01/01 0801) Resp:  [15-18] 16 (01/01 0801) BP: (102-155)/(56-95) 155/78 mmHg (01/01 0801) SpO2:  [87 %-100 %] 100 % (01/01 0801) Original dressing in place and unable to evaluate the wound at this time  Heels are non tender and elevated off the bed using rolled towels Intake/Output from previous day: 12/31 0701 - 01/01 0700 In: 3496.7 [P.O.:180; I.V.:2966.7; IV Piggyback:350] Out: 645 [Urine:595; Blood:50] Intake/Output this shift: Total I/O In: 245 [I.V.:245] Out: -    Recent Labs  01/09/15 1847 01/10/15 0406 01/11/15 0445  HGB 15.0 13.9 11.7*    Recent Labs  01/10/15 0406 01/11/15 0445  WBC 6.9 5.1  RBC 4.78 3.97*  HCT 42.0 35.0*  PLT 123* 97*    Recent Labs  01/10/15 0406 01/11/15 0445  NA 141 139  K 4.2 3.9  CL 108 109  CO2 28 26  BUN 25* 22*  CREATININE 1.31* 1.34*  GLUCOSE 106* 91  CALCIUM 9.0 8.5*    Recent Labs  01/09/15 1847  INR 0.99    EXAM General - Patient is Alert, Appropriate and Oriented Extremity - Neurologically intact Neurovascular intact Sensation intact distally Intact pulses distally Dorsiflexion/Plantar flexion intact Dressing - dressing C/D/I Motor Function - intact, moving foot and toes well on exampatient able to do straight leg raise on his own with the range of motion knee immobilizer in place  Past Medical History  Diagnosis Date  . CHF (congestive  heart failure) (HCC)     nonischemic cardiomyopathy  . CKD (chronic kidney disease)   . CAD (coronary artery disease)     Nonobstructive cardiac disease by cath  . Ventricular tachycardia (HCC)     s/p ICD  . Hypertension   . Hyperlipidemia     Assessment/Plan: 1 Day Post-Op Procedure(s) (LRB): OPEN REDUCTION INTERNAL (ORIF) FIXATION PATELLA (Left) Active Problems:   Patellar fracture   Patella fracture  Estimated body mass index is 29.07 kg/(m^2) as calculated from the following:   Height as of this encounter: 5\' 10"  (1.778 m).   Weight as of this encounter: 91.899 kg (202 lb 9.6 oz). Advance diet Up with therapy D/C IV fluids  LabsWere reviewed DVT Prophylaxis - Lovenox, Foot Pumps and TED hose Weight-Bearing as tolerated to left leg D/C O2 and Pulse OX and try on Room Air Begin working on having a bowel movement today Range of motion knee brace needs to be on at all times. Needs to be locked in extension for now.  Lynnda ShieldsJon R. Galloway Endoscopy CenterWolfe PA [facility] 01/11/2015, 8:33 AM

## 2015-01-12 ENCOUNTER — Inpatient Hospital Stay: Payer: Medicare HMO

## 2015-01-12 LAB — CBC
HEMATOCRIT: 33.8 % — AB (ref 40.0–52.0)
HEMOGLOBIN: 11.6 g/dL — AB (ref 13.0–18.0)
MCH: 30.1 pg (ref 26.0–34.0)
MCHC: 34.3 g/dL (ref 32.0–36.0)
MCV: 88 fL (ref 80.0–100.0)
Platelets: 100 10*3/uL — ABNORMAL LOW (ref 150–440)
RBC: 3.84 MIL/uL — AB (ref 4.40–5.90)
RDW: 14 % (ref 11.5–14.5)
WBC: 6.4 10*3/uL (ref 3.8–10.6)

## 2015-01-12 MED ORDER — OXYCODONE HCL 5 MG PO TABS: 5.0000 mg | ORAL_TABLET | Freq: Four times a day (QID) | ORAL | Status: DC | PRN

## 2015-01-12 MED ORDER — QUETIAPINE FUMARATE 25 MG PO TABS
12.5000 mg | ORAL_TABLET | Freq: Once | ORAL | Status: AC
  Administered 2015-01-12: 12.5 mg via ORAL
  Filled 2015-01-12: qty 1

## 2015-01-12 MED ORDER — HALOPERIDOL LACTATE 5 MG/ML IJ SOLN
5.0000 mg | Freq: Once | INTRAMUSCULAR | Status: DC
  Filled 2015-01-12: qty 1

## 2015-01-12 NOTE — Evaluation (Signed)
Occupational Therapy Evaluation Patient Details Name: Marck Mcclenny MRN: [id number] DOB: Jan 04, 1931 Today's Date: 01/12/2015    History of Present Illness This patient is an 82 year old male who came to [facility] after falling on a pipe and landing on L knee. Pt sustained L patella fracture and received an ORIF repair. Pt currently with brace locked in extension on L knee.    Clinical Impression   This patient is an 82 year old male with the above history. He lives alone with son close by. He had been independent with activities of daily living and functional mobility including driving and mowing lawn. He lives in a one story home with no steps to enter. He now requires assist with basic activities of daily living and would benefit from Occupational Therapy for activities of daily living and functional mobility training.    Follow Up Recommendations       Equipment Recommendations       Recommendations for Other Services       Precautions / Restrictions Precautions Precautions: Fall Restrictions LLE Weight Bearing: Non weight bearing      Mobility Bed Mobility                  Transfers                      Balance                                            ADL                                         General ADL Comments: Patient had been independent. He now requires moderate assist for lower body dressing. Patient practiced today techniques for lower body dressing using hip kit including Donned/doffed socks and pants to knees. Patient given written list of vendors regarding obtaining hip kit if needed.     Vision     Perception     Praxis      Pertinent Vitals/Pain Pain Assessment: 0-10 Pain Score: 5  Pain Location: L knee     Hand Dominance     Extremity/Trunk Assessment Upper Extremity Assessment Upper Extremity Assessment: Overall WFL for tasks assessed   Lower Extremity  Assessment Lower Extremity Assessment: Defer to PT evaluation       Communication Communication Communication: No difficulties   Cognition Arousal/Alertness: Awake/alert Behavior During Therapy: WFL for tasks assessed/performed Overall Cognitive Status: Within Functional Limits for tasks assessed                     General Comments       Exercises       Shoulder Instructions      Home Living Family/patient expects to be discharged to:: Private residence Living Arrangements: Alone (Son is close) Available Help at Discharge: Family Type of Home: House Home Access: Level entry     Home Layout: One level               Home Equipment: Environmental consultant - 2 wheels;Cane - single point;Wheelchair - manual          Prior Functioning/Environment Level of Independence: Independent        Comments: includes driving and  mowing lawn.    OT Diagnosis: Acute pain   OT Problem List: Decreased range of motion;Decreased activity tolerance;Decreased knowledge of use of DME or AE;Pain   OT Treatment/Interventions: Self-care/ADL training    OT Goals(Current goals can be found in the care plan section) Acute Rehab OT Goals Patient Stated Goal: to go home OT Goal Formulation: With patient Time For Goal Achievement: 01/26/15 Potential to Achieve Goals: Good  OT Frequency: Min 1X/week   Barriers to D/C:            Co-evaluation              End of Session Equipment Utilized During Treatment:  (hip kit)  Activity Tolerance:   Patient left: in bed;with call bell/phone within reach;with bed alarm set;with family/visitor present   Time: 3299-2426 OT Time Calculation (min): 15 min Charges:  OT General Charges $OT Visit: 1 Procedure OT Evaluation $Initial OT Evaluation Tier I: 1 Procedure G-Codes:    Myrene Galas, MS/OTR/L  01/12/2015, 9:54 AM

## 2015-01-12 NOTE — Progress Notes (Addendum)
Pt very confused and combative, trying to get up and hitting staff. Calling the police. Hospital police officer spoke with pt. Family notified.  Dr. Clint GuyHower notified. Md to place orders.

## 2015-01-12 NOTE — Progress Notes (Signed)
Physical Therapy Treatment Patient Details Name: Spencer Coleman Roland Brinker MRN: [id number] DOB: 01/30/1930 Today's Date: 01/12/2015    History of Present Illness Pt admitted for falling on a pipe and landing on L knee. Pt sustained L knee patella fracture and is now s/p ORIF. Pt currently with brace locked in extension on L knee.     PT Comments    Pt with change in status this session. Pt confused, [facility] Pt oriented to situation. He still complains of intense pain with WB and is impulsive, trying to stand up out of bed prior to therapist instructions. +2 needed for safety. Pt able to be re-directed easily. Hand on approach needed for all OOB mobility as pt with decreased balance and is at high fall risk. All mobility performed on 1L of O2 with slight SOB symptoms, however inaccurate pulse ox reading, bilat hand tried. Good endurance with there-ex with recall from AM session. Unsafe for home discharge at this time, needs 24/7 assistance. Changing recommendation to SNF for further support from rehab services. RN/CSW made aware.  Follow Up Recommendations  SNF     Equipment Recommendations       Recommendations for Other Services       Precautions / Restrictions Precautions Precautions: Fall Required Braces or Orthoses: Other Brace/Splint Other Brace/Splint: locked knee extension brace Restrictions Weight Bearing Restrictions: Yes LLE Weight Bearing: Weight bearing as tolerated    Mobility  Bed Mobility Overal bed mobility: Needs Assistance Bed Mobility: Supine to Sit     Supine to sit: Min assist     General bed mobility comments: Pt needs increased cues to perform bed mobility this date. Once seated at EOB, pt confused and impulsive. Tries to get up prior to therapist cues twice with manual assist back to bed in order to retrieve rw.  Transfers Overall transfer level: Needs assistance Equipment used: Rolling walker (2 wheeled) Transfers: Sit to/from  Stand Sit to Stand: Mod assist         General transfer comment: +2 assist for safety and mod assist for transfer. Pt tries to pull on rw, with cues for pushing from seated surface. Increased pain with WB  Ambulation/Gait Ambulation/Gait assistance: Min assist Ambulation Distance (Feet): 90 Feet Assistive device: Rolling walker (2 wheeled) Gait Pattern/deviations: Step-to pattern     General Gait Details: ambulated in hallway with antalgic gait pattern. Pt keeps rw too far away increasing fall risk. Min assist required for keeping rw close to body as well as for guidance of AD. Pt with slow gait speed and small step length bilaterally. All mobility performed on 1L of O2.   Stairs            Wheelchair Mobility    Modified Rankin (Stroke Patients Only)       Balance                                    Cognition Arousal/Alertness: Awake/alert Behavior During Therapy: Impulsive Overall Cognitive Status: Impaired/Different from baseline Area of Impairment: Orientation;Safety/judgement                    Exercises Other Exercises Other Exercises: Pt performed supine ther-ex on B LE with good recall from AM session. Exercises performed included B LE ankle pumps, SLRs, hip abd/add and R LE quad sets and knee flexion. Pt able to perform x 12 reps with cga for  assistance.    General Comments        Pertinent Vitals/Pain Pain Assessment: 0-10 Pain Score: 10-Worst pain ever Pain Location: L knee Pain Descriptors / Indicators: Constant Pain Intervention(s): Limited activity within patient's tolerance    Home Living                      Prior Function            PT Goals (current goals can now be found in the care plan section) Acute Rehab PT Goals Patient Stated Goal: to go home PT Goal Formulation: With patient Time For Goal Achievement: 01/25/15 Potential to Achieve Goals: Good Progress towards PT goals: Progressing toward  goals    Frequency  BID    PT Plan Discharge plan needs to be updated    Co-evaluation             End of Session Equipment Utilized During Treatment: Gait belt;Oxygen Activity Tolerance: Patient limited by pain Patient left: in bed;with bed alarm set     Time: 1445-1515 PT Time Calculation (min) (ACUTE ONLY): 30 min  Charges:  $Gait Training: 8-22 mins $Therapeutic Exercise: 8-22 mins                    G Codes:      Enrique Manganaro 2015-01-22, 3:55 PM  Elizabeth Palau, PT, DPT [phone]

## 2015-01-12 NOTE — Care Management Important Message (Signed)
Important Message  Patient Details  Name: Spencer Coleman MRN: [id number] Date of Birth: 10/11/1930   Medicare Important Message Given:  Yes    Luan Urbani A, RN 01/12/2015, 8:43 AM

## 2015-01-12 NOTE — Progress Notes (Signed)
Pt has tried 3 times in the past hour to get out of bed stating he had " go somewhere". Nursing has tried to reorient him but becomes more agitated with each attempt. It took 3 staff nurses to get him back to bed. There is a marked increase in his confusion this evening compared to this afternoon. VS stable.Dr Renae GlossWieting made aware of his status agreed to come to floor and assess him.

## 2015-01-12 NOTE — Progress Notes (Signed)
Physical Therapy Treatment Patient Details Name: Spencer Coleman MRN: [id number] DOB: 1930/06/20 Today's Date: 01/12/2015    History of Present Illness Pt admitted for falling on a pipe and landing on L knee. Pt sustained L knee patella fracture and is now s/p ORIF. Pt currently with brace locked in extension on L knee.     PT Comments    Pt is making good progress towards goals, however is limited by pain. Pain worsens with mobility. RN notified. Safe technique with all OOB mobility, however pt currently refusing to sit in recliner secondary to pain. Will plan to sit in recliner in PM session. Good endurance with hep. Needs 1L of O2 during ambulation as sats decrease on room air.  Follow Up Recommendations  Home health PT     Equipment Recommendations       Recommendations for Other Services       Precautions / Restrictions Precautions Precautions: Fall Required Braces or Orthoses: Other Brace/Splint Other Brace/Splint: locked knee extension brace Restrictions Weight Bearing Restrictions: Yes LLE Weight Bearing: Weight bearing as tolerated    Mobility  Bed Mobility Overal bed mobility: Needs Assistance Bed Mobility: Supine to Sit     Supine to sit: Min assist     General bed mobility comments: safe technique performed, however min assist for scooting out towards EOB. Once seated at EOB, pt able to sit with supervision  Transfers Overall transfer level: Needs assistance Equipment used: Rolling walker (2 wheeled) Transfers: Sit to/from Stand Sit to Stand: Min guard         General transfer comment: Safe technique performed with cues for correct sequencing on rw  Ambulation/Gait Ambulation/Gait assistance: Min guard Ambulation Distance (Feet): 80 Feet Assistive device: Rolling walker (2 wheeled) Gait Pattern/deviations: Step-through pattern     General Gait Details: ambulated with antalgic gait pattern, complaining of increased pain with WB, 7/10. Therapist  notified RN. Pt also ambulated on 1L of O2 with sats at 94%. Slight SOB symptoms noted. Pt fatigues with ambulation, further distance deferred.   Stairs            Wheelchair Mobility    Modified Rankin (Stroke Patients Only)       Balance                                    Cognition Arousal/Alertness: Awake/alert Behavior During Therapy: WFL for tasks assessed/performed Overall Cognitive Status: Within Functional Limits for tasks assessed                      Exercises Other Exercises Other Exercises: Pt educated on HEP and handout given. Pt able to perform supine ther-ex on B LE including 10 reps of ankle pumps, SLRs, and hip abd/add. Pt also performed R LE knee flexion and quad sets. Min assist required for L LE. Exercises performed on room air with sats decreasing to 86%, 1 L of O2 reapplied.    General Comments        Pertinent Vitals/Pain Pain Assessment: 0-10 Pain Score: 5  Pain Location: L knee on patella Pain Descriptors / Indicators: Constant Pain Intervention(s): Limited activity within patient's tolerance;Premedicated before session;Ice applied    Home Living Family/patient expects to be discharged to:: Private residence Living Arrangements: Alone (Son is close) Available Help at Discharge: Family Type of Home: House Home Access: Level entry   Home Layout: One level Home Equipment: Environmental consultant -  2 wheels;Cane - single point;Wheelchair - manual      Prior Function Level of Independence: Independent      Comments: includes driving and mowing lawn.   PT Goals (current goals can now be found in the care plan section) Acute Rehab PT Goals Patient Stated Goal: to go home PT Goal Formulation: With patient Time For Goal Achievement: 01/25/15 Potential to Achieve Goals: Good Progress towards PT goals: Progressing toward goals    Frequency  BID    PT Plan Current plan remains appropriate    Co-evaluation             End  of Session Equipment Utilized During Treatment: Gait belt;Oxygen Activity Tolerance: Patient limited by pain Patient left: in bed;with bed alarm set     Time: 1015-1048 PT Time Calculation (min) (ACUTE ONLY): 33 min  Charges:  $Gait Training: 8-22 mins $Therapeutic Exercise: 8-22 mins                    G Codes:      Cheney Ewart 01/12/2015, 11:40 AM  Elizabeth PalauStephanie Dravon Nott, PT, DPT [phone]

## 2015-01-12 NOTE — Progress Notes (Signed)
Moved pt to room 159 due patient becoming confused to situation and attempted to get out of his bed. Pt oxygen decreases at times  when sleeping lungs are diminished. Dr Ernest PineHooten aware CXR order.

## 2015-01-12 NOTE — Progress Notes (Signed)
Patient ID: Alvin Critchleyra Roland Ratto, male   DOB: 02/02/1930, 82 y.o.   MRN: [id number] [facility] PROGRESS NOTE  Alvin Critchleyra Roland Moye WUJ:[id number] DOB: 06/04/1930 DOA: 01/09/2015 PCP: Barbette ReichmannHANDE,VISHWANATH, MD  HPI/Subjective: Called by nurse that the patient was trying to get out of bed. Patient seems to be answering all questions appropriately. He states that he didn't sleep very well last couple nights.  Objective: Filed Vitals:   01/12/15 1550 01/12/15 1606  BP: 191/75 168/82  Pulse: 64   Temp: 98.1 F (36.7 C)   Resp: 20     Filed Weights   01/09/15 1633 01/09/15 2136  Weight: 93.441 kg (206 lb) 91.899 kg (202 lb 9.6 oz)    ROS: Review of Systems  Constitutional: Negative for fever and chills.  Eyes: Negative for blurred vision.  Respiratory: Negative for cough and shortness of breath.   Cardiovascular: Negative for chest pain.  Gastrointestinal: Negative for nausea, vomiting, abdominal pain and diarrhea.  Genitourinary: Negative for dysuria.  Musculoskeletal: Negative for joint pain.  Neurological: Negative for dizziness and headaches.   Exam: Physical Exam  HENT:  Nose: No mucosal edema.  Mouth/Throat: No oropharyngeal exudate or posterior oropharyngeal edema.  Eyes: Conjunctivae, EOM and lids are normal. Pupils are equal, round, and reactive to light.  Neck: No JVD present. Carotid bruit is not present. No edema present. No thyroid mass and no thyromegaly present.  Cardiovascular: S1 normal and S2 normal.  Exam reveals no gallop.   No murmur heard. Pulses:      Dorsalis pedis pulses are 2+ on the right side, and 2+ on the left side.  Respiratory: No respiratory distress. He has wheezes in the left lower field. He has no rhonchi. He has no rales.  GI: Soft. Bowel sounds are normal. There is no tenderness.  Musculoskeletal:       Right ankle: He exhibits swelling.       Left ankle: He exhibits swelling.  Lymphadenopathy:    He has no cervical adenopathy.   Neurological: He is alert. No cranial nerve deficit.  Skin: Skin is warm. No rash noted. Nails show no clubbing.  Psychiatric: He has a normal mood and affect.      Data Reviewed: Basic Metabolic Panel:  Recent Labs Lab 01/09/15 1847 01/10/15 0406 01/11/15 0445  NA 139 141 139  K 4.5 4.2 3.9  CL 105 108 109  CO2 28 28 26   GLUCOSE 110* 106* 91  BUN 24* 25* 22*  CREATININE 1.38* 1.31* 1.34*  CALCIUM 9.4 9.0 8.5*   CBC:  Recent Labs Lab 01/09/15 1847 01/10/15 0406 01/11/15 0445 01/12/15 0433  WBC 7.9 6.9 5.1 6.4  HGB 15.0 13.9 11.7* 11.6*  HCT 44.6 42.0 35.0* 33.8*  MCV 89.2 87.8 88.1 88.0  PLT 132* 123* 97* 100*   C  Recent Results (from the past 240 hour(s))  MRSA PCR Screening     Status: None   Collection Time: 01/10/15  1:32 AM  Result Value Ref Range Status   MRSA by PCR NEGATIVE NEGATIVE Final    Comment:        The GeneXpert MRSA Assay (FDA approved for NASAL specimens only), is one component of a comprehensive MRSA colonization surveillance program. It is not intended to diagnose MRSA infection nor to guide or monitor treatment for MRSA infections.      Studies: Dg Chest 2 View  01/12/2015  CLINICAL DATA:  Hypoxia. EXAM: CHEST  2 VIEW COMPARISON:  January 09, 2015. FINDINGS: The  heart size and mediastinal contours are within normal limits. Both lungs are clear. Left-sided pacemaker is unchanged in position. No pneumothorax or pleural effusion is noted. Stable elevated right hemidiaphragm is noted. The visualized skeletal structures are unremarkable. IMPRESSION: No active cardiopulmonary disease. Electronically Signed   By: Lupita Raider, M.D.   On: 01/12/2015 16:02    Scheduled Meds: . amiodarone  400 mg Oral QHS  . enoxaparin (LOVENOX) injection  30 mg Subcutaneous Q12H  . ferrous sulfate  325 mg Oral BID WC  . furosemide  20 mg Oral Daily  . metoprolol  50 mg Oral BID  . pantoprazole  40 mg Oral BID  . QUEtiapine  12.5 mg Oral Once  .  senna-docusate  1 tablet Oral BID  . simvastatin  40 mg Oral Daily  . sodium chloride  3 mL Intravenous Q12H    Assessment/Plan:  1. Acute delirium versus possible sundowning.  I stopped the Benadryl, decreased dose of pain medication, stopped tramadol. Since the patient has not slept very well over the past few nights I will give a dose of Seroquel. Continue to monitor. Discontinue telemetry. 2. As per nursing staff, he desaturated at night. Continue oxygen at night. 3. History of ventricular tachycardia on amiodarone and metoprolol 4. Essential hypertension continue usual medications 5. Hyperlipidemia unspecified continue simvastatin 6. Left patellar fracture- management as per orthopedic surgery  Code Status:     Code Status Orders        Start     Ordered   01/10/15 1504  Full code   Continuous     01/10/15 1503    Advance Directive Documentation        Most Recent Value   Type of Advance Directive  Healthcare Power of Attorney, Living will [No paperwork in chart]   Pre-existing out of facility DNR order (yellow form or pink MOST form)     "MOST" Form in Place?       Disposition Plan: As per orthopedic surgery  Time spent: 20 minutes  Alford Highland  [facility]

## 2015-01-12 NOTE — Progress Notes (Signed)
Pt was able to speak with his brother Nelma RothmanDaryl and able to calm down.

## 2015-01-12 NOTE — Care Management Note (Addendum)
Case Management Note  Patient Details  Name: Alvin Critchleyra Roland Garde MRN: [id number] Date of Birth: 08/14/1930  Subjective/Objective:   [name] reports that after this hospitalization that he will be residing at [address] [place] and that his cell phone is [phone]. [facility] [address] [name] reports that he has a rolling walker, a cane and a wheelchair at home. [facility]-OT is recommending home OT for vestibular therapy per dizziness with mobility. [facility], [facility] on [address], [phone].  [facility] reports that they cannot run this prescription because they do not have a 2017 insurance provider on file. Discussed home health providers with [name] and he chose [facility]. A verbal referral was given to Feliberto GottronJason Hinton [facility] requesting home health PT and OT. Case management will follow for discharge planning.                 Action/Plan:   Expected Discharge Date:                  Expected Discharge Plan:     In-House Referral:     Discharge planning Services     Post Acute Care Choice:    Choice offered to:     DME Arranged:    DME Agency:     HH Arranged:    HH Agency:     Status of Service:     Medicare Important Message Given:  Yes Date Medicare IM Given:    Medicare IM give by:    Date Additional Medicare IM Given:    Additional Medicare Important Message give by:     If discussed at Long Length of Stay Meetings, dates discussed:    Additional Comments:  Aditi Rovira A, RN 01/12/2015, 8:46 AM

## 2015-01-12 NOTE — Progress Notes (Signed)
   Subjective: 2 Days Post-Op Procedure(s) (LRB): OPEN REDUCTION INTERNAL (ORIF) FIXATION PATELLA (Left) Patient reports pain as moderate.   Patient is well, and has had no acute complaints or problems Continue with physical therapy today.  Plan is to go Home after hospital stay. no nausea and no vomiting Patient denies any chest pains or shortness of breath. Objective: Vital signs in last 24 hours: Temp:  [97.5 F (36.4 C)-98.2 F (36.8 C)] 98.2 F (36.8 C) (01/02 0744) Pulse Rate:  [59-74] 63 (01/02 0744) Resp:  [16-18] 18 (01/02 0744) BP: (118-153)/(59-71) 118/62 mmHg (01/02 0744) SpO2:  [84 %-100 %] 97 % (01/02 0744) well approximated incision Heels are non tender and elevated off the bed using rolled towels Intake/Output from previous day: 01/01 0701 - 01/02 0700 In: 968 [P.O.:720; I.V.:248] Out: 1050 [Urine:1050] Intake/Output this shift:     Recent Labs  01/09/15 1847 01/10/15 0406 01/11/15 0445 01/12/15 0433  HGB 15.0 13.9 11.7* 11.6*    Recent Labs  01/11/15 0445 01/12/15 0433  WBC 5.1 6.4  RBC 3.97* 3.84*  HCT 35.0* 33.8*  PLT 97* 100*    Recent Labs  01/10/15 0406 01/11/15 0445  NA 141 139  K 4.2 3.9  CL 108 109  CO2 28 26  BUN 25* 22*  CREATININE 1.31* 1.34*  GLUCOSE 106* 91  CALCIUM 9.0 8.5*    Recent Labs  01/09/15 1847  INR 0.99    EXAM General - Patient is Alert, Appropriate and Oriented Extremity - Neurologically intact Neurovascular intact Sensation intact distally Intact pulses distally Dorsiflexion/Plantar flexion intact Dressing - moderate drainage Motor Function - intact, moving foot and toes well on exam.    Past Medical History  Diagnosis Date  . CHF (congestive heart failure) (HCC)     nonischemic cardiomyopathy  . CKD (chronic kidney disease)   . CAD (coronary artery disease)     Nonobstructive cardiac disease by cath  . Ventricular tachycardia (HCC)     s/p ICD  . Hypertension   . Hyperlipidemia      Assessment/Plan: 2 Days Post-Op Procedure(s) (LRB): OPEN REDUCTION INTERNAL (ORIF) FIXATION PATELLA (Left) Active Problems:   Patellar fracture   Patella fracture  Estimated body mass index is 29.07 kg/(m^2) as calculated from the following:   Height as of this encounter: 5\' 10"  (1.778 m).   Weight as of this encounter: 91.899 kg (202 lb 9.6 oz). Up with therapy Plan for discharge tomorrow Discharge home with home health  Labs: Were reviewed DVT Prophylaxis - Lovenox, Foot Pumps and TED hose Weight-Bearing as tolerated to left leg Patient needs to have a bowel movement today Dressing was changed on today's visit  Quamel Fitzmaurice R. [facility] PA [facility] 01/12/2015, 8:19 AM

## 2015-01-13 LAB — CBC
HCT: 35.2 % — ABNORMAL LOW (ref 40.0–52.0)
Hemoglobin: 11.8 g/dL — ABNORMAL LOW (ref 13.0–18.0)
MCH: 29.5 pg (ref 26.0–34.0)
MCHC: 33.6 g/dL (ref 32.0–36.0)
MCV: 87.7 fL (ref 80.0–100.0)
PLATELETS: 127 10*3/uL — AB (ref 150–440)
RBC: 4.01 MIL/uL — AB (ref 4.40–5.90)
RDW: 14.3 % (ref 11.5–14.5)
WBC: 8.4 10*3/uL (ref 3.8–10.6)

## 2015-01-13 LAB — URINALYSIS COMPLETE WITH MICROSCOPIC (ARMC ONLY)
BACTERIA UA: NONE SEEN
Bilirubin Urine: NEGATIVE
Glucose, UA: NEGATIVE mg/dL
Hgb urine dipstick: NEGATIVE
KETONES UR: NEGATIVE mg/dL
LEUKOCYTES UA: NEGATIVE
NITRITE: NEGATIVE
PROTEIN: NEGATIVE mg/dL
SPECIFIC GRAVITY, URINE: 1.015 (ref 1.005–1.030)
pH: 5 (ref 5.0–8.0)

## 2015-01-13 LAB — BASIC METABOLIC PANEL
Anion gap: 8 (ref 5–15)
BUN: 21 mg/dL — ABNORMAL HIGH (ref 6–20)
CHLORIDE: 105 mmol/L (ref 101–111)
CO2: 28 mmol/L (ref 22–32)
CREATININE: 1.19 mg/dL (ref 0.61–1.24)
Calcium: 8.8 mg/dL — ABNORMAL LOW (ref 8.9–10.3)
GFR, EST NON AFRICAN AMERICAN: 55 mL/min — AB (ref >60)
Glucose, Bld: 103 mg/dL — ABNORMAL HIGH (ref 65–99)
Potassium: 4.2 mmol/L (ref 3.5–5.1)
SODIUM: 141 mmol/L (ref 135–145)

## 2015-01-13 NOTE — Clinical Social Work Placement (Signed)
   CLINICAL SOCIAL WORK PLACEMENT  NOTE  Date:  01/13/2015  Patient Details  Name: Spencer Critchleyra Roland Gundrum MRN: [id number] Date of Birth: 10/06/1930  Clinical Social Work is seeking post-discharge placement for this patient at the Skilled  Nursing Facility level of care (*CSW will initial, date and re-position this form in  chart as items are completed):  Yes   Patient/family provided with [facility] Clinical Social Work Department's list of facilities offering this level of care within the geographic area requested by the patient (or if unable, by the patient's family).  Yes   Patient/family informed of their freedom to choose among providers that offer the needed level of care, that participate in Medicare, Medicaid or managed care program needed by the patient, have an available bed and are willing to accept the patient.  Yes   Patient/family informed of [facility]'s ownership interest in [facility] and [facility], as well as of the fact that they are under no obligation to receive care at these facilities.  PASRR submitted to EDS on 01/13/15     PASRR number received on 01/13/15     Existing PASRR number confirmed on       FL2 transmitted to all facilities in geographic area requested by pt/family on 01/13/15     FL2 transmitted to all facilities within larger geographic area on       Patient informed that his/her managed care company has contracts with or will negotiate with certain facilities, including the following:        Yes   Patient/family informed of bed offers received.  Patient chooses bed at [facility]     Physician recommends and patient chooses bed at  [facility](SNF)    Patient to be transferred to   on  .  Patient to be transferred to facility by       Patient family notified on   of transfer.  Name of family member notified:        PHYSICIAN       Additional Comment:    _______________________________________________ Dede QuerySarah Chandel Zaun,  LCSW 01/13/2015, 2:30 PM

## 2015-01-13 NOTE — Progress Notes (Signed)
Pt confused most of the night. Pt was able to calm down after talking with his brother Nelma RothmanDaryl. He did not need to receive one time order of Haldol. Able to get sleep during the night. Refusing to wear compression boots.

## 2015-01-13 NOTE — Clinical Social Work Note (Signed)
Pt is agreeable to SNF at [facility]. CSW updated facility. [employer] Medicare Auth has been initiated and is currently pending. Per RN, pt will be ready for discharge 01/14/2015. CSW will continue to follow.   Dede QuerySarah Lissette Schenk, MSW, LCSW Clinical Social Worker  [phone]

## 2015-01-13 NOTE — Plan of Care (Signed)
Problem: Safety: Goal: Ability to remain free from injury will improve Outcome: Progressing Pt impulsive and confused during the night. 1:1 sitter for safety.  Problem: Pain Managment: Goal: General experience of comfort will improve Outcome: Progressing No complaints of pain during the night.

## 2015-01-13 NOTE — Care Management (Signed)
PT now recommending SNF.   CSW following.  RNCM available for discharge planning

## 2015-01-13 NOTE — [place] FL2 (Signed)
[place] MEDICA[place] FL2 LEVEL OF CARE SCREENING TOOL     IDENTIFICATION  Patient Name: Spencer Coleman Birthdate: 10/22/1930 Sex: male Admission Date (Current Location): 01/09/2015  [place]   Facility and Address:  [facility], [address], [place], [place] [address]      Provider Number: [id number]  Attending Physician Name and Address:  Donato HeinzJames P Hooten, MD  Relative Name and Phone Number:       Current Level of Care: Hospital Recommended Level of Care: Skilled Nursing Facility Prior Approval Number:    Date Approved/Denied:   PASRR Number:  [id number]  Discharge Plan: SNF    Current Diagnoses: Patient Active Problem List   Diagnosis Date Noted  . Patellar fracture 01/09/2015  . Calculus of kidney 01/09/2015  . Congestive heart failure (HCC) 01/09/2015  . Patella fracture 01/09/2015  . Cardiomyopathy (HCC) 06/06/2013  . History of cardiac catheterization 06/06/2013  . BP (high blood pressure) 06/06/2013  . HLD (hyperlipidemia) 06/06/2013  . Ventricular tachycardia (HCC) 04/05/2013  . Automatic implantable cardioverter-defibrillator in situ 03/25/2013    Orientation RESPIRATION BLADDER Height & Weight    Self, Time, Situation, Place  Normal Continent 5\' 10"  (177.8 cm) 202 lbs.  BEHAVIORAL SYMPTOMS/MOOD NEUROLOGICAL BOWEL NUTRITION STATUS  Verbally abusive   Continent  (Diabetic)  AMBULATORY STATUS COMMUNICATION OF NEEDS Skin   Limited Assist Verbally Normal                       Personal Care Assistance Level of Assistance  Bathing, Dressing Bathing Assistance: Limited assistance   Dressing Assistance: Limited assistance     Functional Limitations Info  Sight, Hearing, Speech Sight Info: Adequate Hearing Info: Adequate Speech Info: Adequate    SPECIAL CARE FACTORS FREQUENCY  PT (By licensed PT), OT (By licensed OT)     PT Frequency: 5x week OT Frequency: 3x week             Contractures      Additional Factors Info                  Current Medications (01/13/2015):  This is the current hospital active medication list Current Facility-Administered Medications  Medication Dose Route Frequency Provider Last Rate Last Dose  . acetaminophen (TYLENOL) tablet 650 mg  650 mg Oral Q6H PRN Enid Baasadhika Kalisetti, MD       Or  . acetaminophen (TYLENOL) suppository 650 mg  650 mg Rectal Q6H PRN Enid Baasadhika Kalisetti, MD      . alum & mag hydroxide-simeth (MAALOX/MYLANTA) 200-200-20 MG/5ML suspension 30 mL  30 mL Oral Q4H PRN Donato HeinzJames P Hooten, MD      . amiodarone (PACERONE) tablet 400 mg  400 mg Oral QHS Enid Baasadhika Kalisetti, MD   400 mg at 01/12/15 2100  . bisacodyl (DULCOLAX) suppository 10 mg  10 mg Rectal Daily PRN Donato HeinzJames P Hooten, MD      . enoxaparin (LOVENOX) injection 30 mg  30 mg Subcutaneous Q12H Donato HeinzJames P Hooten, MD   30 mg at 01/12/15 2040  . ferrous sulfate tablet 325 mg  325 mg Oral BID WC Donato HeinzJames P Hooten, MD   325 mg at 01/12/15 0913  . furosemide (LASIX) tablet 20 mg  20 mg Oral Daily Enid Baasadhika Kalisetti, MD   20 mg at 01/12/15 0912  . haloperidol lactate (HALDOL) injection 5 mg  5 mg Intravenous Once Wyatt Hasteavid K Hower, MD   5 mg at 01/13/15 0303  .  magnesium hydroxide (MILK OF MAGNESIA) suspension 30 mL  30 mL Oral Daily PRN Donato Heinz, MD      . menthol-cetylpyridinium (CEPACOL) lozenge 3 mg  1 lozenge Oral PRN Donato Heinz, MD       Or  . phenol (CHLORASEPTIC) mouth spray 1 spray  1 spray Mouth/Throat PRN Donato Heinz, MD      . metoprolol (LOPRESSOR) tablet 50 mg  50 mg Oral BID Enid Baas, MD   50 mg at 01/12/15 2100  . ondansetron (ZOFRAN) tablet 4 mg  4 mg Oral Q6H PRN Donato Heinz, MD       Or  . ondansetron (ZOFRAN) injection 4 mg  4 mg Intravenous Q6H PRN Donato Heinz, MD      . oxyCODONE (Oxy IR/ROXICODONE) immediate release tablet 5 mg  5 mg Oral Q6H PRN Alford Highland, MD      . pantoprazole (PROTONIX) EC tablet 40 mg  40 mg Oral BID  Donato Heinz, MD   40 mg at 01/12/15 2101  . polyethylene glycol (MIRALAX / GLYCOLAX) packet 17 g  17 g Oral Daily PRN Enid Baas, MD      . senna-docusate (Senokot-S) tablet 1 tablet  1 tablet Oral BID Donato Heinz, MD   1 tablet at 01/12/15 0913  . simvastatin (ZOCOR) tablet 40 mg  40 mg Oral Daily Enid Baas, MD   40 mg at 01/12/15 0912  . sodium chloride 0.9 % injection 3 mL  3 mL Intravenous Q12H Enid Baas, MD   3 mL at 01/12/15 0955  . sodium phosphate (FLEET) 7-19 GM/118ML enema 1 enema  1 enema Rectal Once PRN Donato Heinz, MD         Discharge Medications: Please see discharge summary for a list of discharge medications.  Relevant Imaging Results:  Relevant Lab Results:   Additional Information SSN [id number]  Cheron Schaumann, [place]

## 2015-01-13 NOTE — Clinical Social Work Note (Signed)
LCSW met with patient and pt brother. He was informed [facility] has made a bed offer and patients brother will encourage him to try it for a week at least.  Patient is pretty set on returning home ASAP and his brother Lonna Cobb will attempt to reason with him. Will check back in a couple of hours.   Dametrius Sanjuan LCSW

## 2015-01-13 NOTE — Progress Notes (Signed)
Physical Therapy Treatment Patient Details Name: Spencer Critchleyra Roland Crittendon MRN: [id number] DOB: 01/13/1930 Today's Date: 01/13/2015    History of Present Illness Pt admitted for falling on a pipe and landing on L knee. Pt sustained L knee patella fracture and is now s/p ORIF. Pt currently with brace locked in extension on L knee.     PT Comments    Pt extremely lethargic this afternoon; awakens through voice and gentle shaking, but awakens startled. Sleeps restless with upper extremity random movements and flexing trunk occasionally. Less active participation in bed exercises this afternoon. Deferred up in bed/out of bed activities due to level of lethargy and restlessness. Continue PT to progress strength and functional mobility as appropriate.   Follow Up Recommendations  SNF     Equipment Recommendations       Recommendations for Other Services       Precautions / Restrictions Precautions Precautions: Fall Restrictions Weight Bearing Restrictions: Yes LLE Weight Bearing: Weight bearing as tolerated    Mobility  Bed Mobility               General bed mobility comments: Repositioned upward in bed with Min A and use of rails  Transfers                 General transfer comment: Not tested due to level of lethargy and restless sleep.  Ambulation/Gait                 Stairs            Wheelchair Mobility    Modified Rankin (Stroke Patients Only)       Balance                                    Cognition Arousal/Alertness: Lethargic Behavior During Therapy: Restless Overall Cognitive Status: Impaired/Different from baseline                      Exercises General Exercises - Lower Extremity Ankle Circles/Pumps: AAROM;Both;20 reps;Supine Quad Sets: Strengthening;Both;10 reps;Supine Gluteal Sets:  (attempted, does not stay on task due to falling asleep) Short Arc Quad: AAROM;Right;20 reps;Supine Heel Slides:  AAROM;Right;20 reps;Supine Hip ABduction/ADduction: AAROM;Both;20 reps;Supine Straight Leg Raises: AAROM;Both;20 reps;Supine    General Comments        Pertinent Vitals/Pain Pain Assessment:  (Does not quantify; states it hurts from L knee to ankle) Pain Score: 9  Pain Location: L knee to ankle Pain Descriptors / Indicators: Constant Pain Intervention(s): Limited activity within patient's tolerance;Monitored during session;Ice applied    Home Living                      Prior Function            PT Goals (current goals can now be found in the care plan section) Progress towards PT goals: Progressing toward goals (slowly)    Frequency  BID    PT Plan Current plan remains appropriate    Co-evaluation             End of Session   Activity Tolerance: Patient limited by lethargy Patient left: in bed;with call bell/phone within reach;with bed alarm set;with SCD's reapplied (polar care in place)     Time: [id number] PT Time Calculation (min) (ACUTE ONLY): 24 min  Charges:  $Therapeutic Exercise: 23-37 mins  G Codes:      Kristeen Miss 01/13/2015, 1:59 PM

## 2015-01-13 NOTE — Progress Notes (Signed)
   Subjective: 3 Days Post-Op Procedure(s) (LRB): OPEN REDUCTION INTERNAL (ORIF) FIXATION PATELLA (Left) Patient reports pain as 0 on 0-10 scale.   Patient is well, and has had no acute complaints or problems Continue with physical  therapy today.  Plan is to go Home after hospital stay. no nausea and no vomiting Patient denies any chest pains or shortness of breath. Had some confusion last evening . Called the police. Seem to be ok this am. Possible 2/2 medication.  Objective: Vital signs in last 24 hours: Temp:  [98.1 F (36.7 C)-99.9 F (37.7 C)] 99.9 F (37.7 C) (01/03 0337) Pulse Rate:  [59-70] 59 (01/03 0337) Resp:  [17-20] 18 (01/03 0337) BP: (118-191)/(62-82) 156/68 mmHg (01/03 0337) SpO2:  [87 %-100 %] 98 % (01/03 0337) well approximated incision Heels are non tender and elevated off the bed using rolled towels Intake/Output from previous day: 01/02 0701 - 01/03 0700 In: 120 [P.O.:120] Out: 635 [Urine:635] Intake/Output this shift:     Recent Labs  01/11/15 0445 01/12/15 0433  HGB 11.7* 11.6*    Recent Labs  01/11/15 0445 01/12/15 0433  WBC 5.1 6.4  RBC 3.97* 3.84*  HCT 35.0* 33.8*  PLT 97* 100*    Recent Labs  01/11/15 0445 01/13/15 0617  NA 139 141  K 3.9 4.2  CL 109 105  CO2 26 28  BUN 22* 21*  CREATININE 1.34* 1.19  GLUCOSE 91 103*  CALCIUM 8.5* 8.8*   No results for input(s): LABPT, INR in the last 72 hours.  EXAM General - Patient is Alert, Disorganized and Oriented Extremity - Neurologically intact Neurovascular intact Sensation intact distally Intact pulses distally Dorsiflexion/Plantar flexion intact Dressing - scant drainage Motor Function - intact, moving foot and toes well on exam.    Past Medical History  Diagnosis Date  . CHF (congestive heart failure) (HCC)     nonischemic cardiomyopathy  . CKD (chronic kidney disease)   . CAD (coronary artery disease)     Nonobstructive cardiac disease by cath  . Ventricular  tachycardia (HCC)     s/p ICD  . Hypertension   . Hyperlipidemia     Assessment/Plan: 3 Days Post-Op Procedure(s) (LRB): OPEN REDUCTION INTERNAL (ORIF) FIXATION PATELLA (Left) Active Problems:   Patellar fracture   Patella fracture  Estimated body mass index is 29.07 kg/(m^2) as calculated from the following:   Height as of this encounter: 5\' 10"  (1.778 m).   Weight as of this encounter: 91.899 kg (202 lb 9.6 oz). Up with therapy Plan for discharge tomorrow Discharge home with home health  Labs: reviewed DVT Prophylaxis - Lovenox, Foot Pumps and TED hose Weight-Bearing as tolerated to left leg Continue to work on have bowel movement Needs to do the lap around the station and do step.  Will keep til tomorrow to be sure that the confusion has cleared  Cletis AthensJon R. [facility] PA [facility] 01/13/2015, 7:26 AM

## 2015-01-13 NOTE — Progress Notes (Signed)
Pt has been without a sitter since 1130 AM today without incident. Pt has periods of intermittent confusion mostly after taking a nap but is easily reoriented. He has not attempted to get out of bed without help and has agreed to discharge to SNF.

## 2015-01-13 NOTE — Progress Notes (Addendum)
Physical Therapy Treatment Patient Details Name: Spencer Coleman MRN: [id number] DOB: 07/09/1930 Today's Date: 01/13/2015    History of Present Illness Pt admitted for falling on a pipe and landing on L knee. Pt sustained L knee patella fracture and is now s/p ORIF. Pt currently with brace locked in extension on L knee.     PT Comments    Pt lethargic, but less confusion according to brother and nursing. Pt rates pain in left knee 9/10. Pt participates well in supine bed exercises and fall fast asleep post exercises. Pt did not wish to walk or have need to walk to bathroom currently. Plan on seeing pt this afternoon to progress strength and hopeful out of bed/ambulation activities.  Follow Up Recommendations  SNF     Equipment Recommendations       Recommendations for Other Services       Precautions / Restrictions Precautions Precautions: Fall Restrictions Weight Bearing Restrictions: Yes LLE Weight Bearing: Weight bearing as tolerated    Mobility  Bed Mobility               General bed mobility comments: Not tested; refused up in chair; doesnt wish walk or to bathroom  Transfers                    Ambulation/Gait                 Stairs            Wheelchair Mobility    Modified Rankin (Stroke Patients Only)       Balance                                    Cognition Arousal/Alertness: Lethargic Behavior During Therapy: WFL for tasks assessed/performed Overall Cognitive Status: Impaired/Different from baseline (per brother)                      Exercises General Exercises - Lower Extremity Ankle Circles/Pumps: AROM;Both;20 reps;Supine Quad Sets: Strengthening;Both;20 reps;Supine Gluteal Sets: Strengthening;Both;20 reps;Supine Short Arc Quad: Right;20 reps;Supine;AROM Heel Slides: Strengthening;Right;20 reps;Supine Hip ABduction/ADduction: AROM;Left;20 reps;Supine (Strengthening R) Straight Leg Raises:  Strengthening;Both;10 reps;Supine    General Comments        Pertinent Vitals/Pain Pain Assessment: 0-10 Pain Score: 9  Pain Location: L knee Pain Descriptors / Indicators: Constant Pain Intervention(s): Limited activity within patient's tolerance;Monitored during session;Ice applied    Home Living                      Prior Function            PT Goals (current goals can now be found in the care plan section) Progress towards PT goals: Progressing toward goals    Frequency  BID    PT Plan Current plan remains appropriate    Co-evaluation             End of Session   Activity Tolerance: Patient tolerated treatment well Patient left: in bed;with bed alarm set;with family/visitor present;with SCD's reapplied (polar care)     Time: 78290953- 10:25    Charges:  $Therapeutic Exercise: 23-37 mins                    G Codes:      Kristeen MissHeidi Elizabeth Bishop 01/13/2015, 10:38 AM

## 2015-01-13 NOTE — Progress Notes (Signed)
[facility] [place] [facility]   PATIENT NAME: Spencer Coleman    MR#:  [id number]  DATE OF BIRTH:  11/06/1930  SUBJECTIVE:Admitted for left patellar fracture.status post surgery. Got very confused yesterday,started on seroquel.now discharge plan to SNF rather than home.  CHIEF COMPLAINT:   Chief Complaint  Patient presents with  . Fall  . Knee Pain    REVIEW OF SYSTEMS:   ROS CONSTITUTIONAL: No fever, fatigue or weakness.   EYES: No blurred or double vision.  EARS, NOSE, AND THROAT: No tinnitus or ear pain.  RESPIRATORY: No cough, shortness of breath, wheezing or hemoptysis.  CARDIOVASCULAR: No chest pain, orthopnea, edema.  GASTROINTESTINAL: No nausea, vomiting, diarrhea or abdominal pain.  GENITOURINARY: No dysuria, hematuria.  ENDOCRINE: No polyuria, nocturia,  HEMATOLOGY: No anemia, easy bruising or bleedi NEUROLOGIC: No tingling, numbness, weakness.  PSYCHIATRY: No anxiety or depression.  Musuloskeletal;left knee pain,swelling.  DRUG ALLERGIES:  No Known Allergies  VITALS:  Blood pressure 156/68, pulse 59, temperature 99.9 F (37.7 C), temperature source Oral, resp. rate 18, height 5\' 10"  (1.778 m), weight 91.899 kg (202 lb 9.6 oz), SpO2 91 %.  PHYSICAL EXAMINATION:  GENERAL:  82 y.o.-year-old patient lying in the bed with no acute distress.  EYES: Pupils equal, round, reactive to light and accommodation. No scleral icterus. Extraocular muscles intact.  HEENT: Head atraumatic, normocephalic. Oropharynx and nasopharynx clear.  NECK:  Supple, no jugular venous distention. No thyroid enlargement, no tenderness.  LUNGS: Normal breath sounds bilaterally, no wheezing, rales,rhonchi or crepitation. No use of accessory muscles of respiration.  CARDIOVASCULAR: S1, S2 normal. No murmurs, rubs, or gallops.  ABDOMEN: Soft, nontender, nondistended. Bowel sounds present. No organomegaly or mass.  EXTREMITIES: left  Knee dressing present  NEUROLOGIC:  Cranial nerves II through XII are intact. Muscle strength 5/5 in all extremities. Sensation intact. Gait not checked.  PSYCHIATRIC: The patient is alert and oriented x 3.  SKIN: No obvious rash, lesion, or ulcer.    LABORATORY PANEL:   CBC  Recent Labs Lab 01/13/15 1059  WBC 8.4  HGB 11.8*  HCT 35.2*  PLT 127*   ------------------------------------------------------------------------------------------------------------------  Chemistries   Recent Labs Lab 01/13/15 0617  NA 141  K 4.2  CL 105  CO2 28  GLUCOSE 103*  BUN 21*  CREATININE 1.19  CALCIUM 8.8*   ------------------------------------------------------------------------------------------------------------------  Cardiac Enzymes No results for input(s): TROPONINI in the last 168 hours. ------------------------------------------------------------------------------------------------------------------  RADIOLOGY:  Dg Chest 2 View  01/12/2015  CLINICAL DATA:  Hypoxia. EXAM: CHEST  2 VIEW COMPARISON:  January 09, 2015. FINDINGS: The heart size and mediastinal contours are within normal limits. Both lungs are clear. Left-sided pacemaker is unchanged in position. No pneumothorax or pleural effusion is noted. Stable elevated right hemidiaphragm is noted. The visualized skeletal structures are unremarkable. IMPRESSION: No active cardiopulmonary disease. Electronically Signed   By: Lupita RaiderJames  Green Jr, M.D.   On: 01/12/2015 16:02    EKG:   Orders placed or performed during the hospital encounter of 01/09/15  . ED EKG  . ED EKG  . EKG 12-Lead  . EKG 12-Lead    ASSESSMENT AND PLAN:  1.acute left patellar  Fracture.s/p surgery.continue pain meds,,PTas per Ortho, Lovenox.,   D/c plan to SNF 2.chronic systolic chf,h/o Non ischeic cardiomyopathy;EF 40%,continue lasix, 3.h/o v.tach;s/p ICD;continue amiodarone, also on metoprolol. Heart rate in 70s.  4.htn;stable. 5.HLP;stable  6.confusion,sundowning;started on  seroquel,check UA for UTI;chest xray did not show any infection.  All the records are  reviewed and case discussed with Care Management/Social Workerr. Management plans discussed with the patient, family and they are in agreement.  CODE STATUS: full  TOTAL TIME TAKING CARE OF THIS PATIENT: .   POSSIBLE D/C IN 2-3DAYS, DEPENDING ON CLINICAL CONDITION.   Katha Hamming M.D on 01/13/2015 at 3:53 PM  Between 7am to 6pm - Pager - [phone]  After 6pm go to [email or web address] - password [email or web address]  [facility]  Office  [phone]  CC: Primary care physician; Barbette Reichmann, MD   Note: This dictation was prepared with Dragon dictation along with smaller phrase technology. Any transcriptional errors that result from this process are unintentional.

## 2015-01-13 NOTE — Clinical Social Work Note (Signed)
LCSW met with patient and and his brother Reita Cliche [phone] Pt reports he will give permission and verbal consent to locate a potential SNF  for his future consideration.   LCSW  Inquired if pt has a guardian and both brother and patient stated NO  LCSW has faxed out Fl2 and CSW initial request and therapy notes to [facility] and will await a response.

## 2015-01-14 MED ORDER — OXYCODONE HCL 5 MG PO TABS: 5.0000 mg | ORAL_TABLET | Freq: Four times a day (QID) | ORAL | Status: DC | PRN

## 2015-01-14 MED ORDER — ASPIRIN EC 325 MG PO TBEC: 325.0000 mg | DELAYED_RELEASE_TABLET | Freq: Every day | ORAL | Status: DC

## 2015-01-14 NOTE — Discharge Planning (Signed)
Pt IV DCd. Pt DC papers explained, and educated.  Pt told being sent with pain script to facility and suggested FU appts. EMS and family contacted about DC plans and transport to facilit. Report given to Sharma Coverthelma Henderson, LPN. Pt will be sent with packet, immobilizer and polar care. Awaiting EMS to arrive.

## 2015-01-14 NOTE — Care Management Important Message (Signed)
Important Message  Patient Details  Name: Alvin Critchleyra Roland Klinker MRN: [id number] Date of Birth: 05/11/1930   Medicare Important Message Given:  Yes    Olegario MessierKathy A Lesieli Bresee 01/14/2015, 10:13 AM

## 2015-01-14 NOTE — Progress Notes (Addendum)
Physical Therapy Treatment Patient Details Name: Spencer Coleman MRN: [id number] DOB: 11/06/1930 Today's Date: 01/14/2015    History of Present Illness Pt admitted for falling on a pipe and landing on L knee. Pt sustained L knee patella fracture and is now s/p ORIF. Pt currently with brace locked in extension on L knee.     PT Comments    Pt without lethargy/confusion today. Demonstrates improved participation and strength with exercises, bed mobility and transfers today. O2 saturation improves to 95% with sitting and remains so with ambulation. Pt also demonstrating ambulation of 50 ft with rolling walker in step to fashion. Pt does note pain increases from 0 at rest to 7/10 with ambulation, which subsides again with rest. Pt required cues on turns to avoid twisting on left knee and to use hands for controlled descent with sit. Continue PT for progression of strength, endurance, safety and ambulation to improve functional mobility.   Follow Up Recommendations  SNF     Equipment Recommendations       Recommendations for Other Services       Precautions / Restrictions Precautions Precautions: Fall Restrictions Weight Bearing Restrictions: Yes LLE Weight Bearing: Weight bearing as tolerated    Mobility  Bed Mobility Overal bed mobility: Modified Independent Bed Mobility: Supine to Sit;Sit to Supine     Supine to sit: Modified independent (Device/Increase time) Sit to supine: Modified independent (Device/Increase time)   General bed mobility comments: Mild increased time; reposittions self in bed with use of rails  Transfers Overall transfer level: Needs assistance Equipment used: Rolling walker (2 wheeled) Transfers: Sit to/from Stand Sit to Stand: Min guard         General transfer comment: Cues for hand placement; mildly uncontrolled descent with sit   Ambulation/Gait Ambulation/Gait assistance: Min guard Ambulation Distance (Feet): 50 Feet Assistive device:  Rolling walker (2 wheeled) Gait Pattern/deviations: Step-to pattern   Gait velocity interpretation: <1.8 ft/sec, indicative of risk for recurrent falls General Gait Details: cues on turn to avoid twisting on L knee   Stairs            Wheelchair Mobility    Modified Rankin (Stroke Patients Only)       Balance Overall balance assessment: Needs assistance Sitting-balance support: Feet supported Sitting balance-Leahy Scale: Normal     Standing balance support: Bilateral upper extremity supported Standing balance-Leahy Scale: Fair                      Cognition Arousal/Alertness: Awake/alert Behavior During Therapy: WFL for tasks assessed/performed Overall Cognitive Status: Within Functional Limits for tasks assessed                      Exercises General Exercises - Lower Extremity Ankle Circles/Pumps: AROM;Both;20 reps;Supine Quad Sets: Strengthening;Both;20 reps;Supine Gluteal Sets: Strengthening;Both;20 reps;Supine Long Arc Quad: AROM;Right;20 reps;Seated Heel Slides: AROM;Right;20 reps;Supine Hip ABduction/ADduction: AROM;Both;20 reps;Supine (Assisted L only to elevate off bed) Straight Leg Raises: AAROM;Both;20 reps;Supine Hip Flexion/Marching: AROM;Right;20 reps;Seated    General Comments        Pertinent Vitals/Pain Pain Assessment: 0-10 Pain Score: 7  (With ambulation; 0 at rest) Pain Location: L knee to ankle    Home Living                      Prior Function            PT Goals (current goals can now be found in the care plan  section) Progress towards PT goals: Progressing toward goals    Frequency  BID    PT Plan Current plan remains appropriate    Co-evaluation             End of Session Equipment Utilized During Treatment: Gait belt Activity Tolerance: Patient tolerated treatment well Patient left: in bed;with call bell/phone within reach;with bed alarm set;with family/visitor present     Time:  0926-1009 PT Time Calculation (min) (ACUTE ONLY): 43 min  Charges:  $Gait Training: 8-22 mins $Therapeutic Exercise: 23-37 mins                    G Codes:      Kristeen Miss 01/14/2015, 10:59 AM

## 2015-01-14 NOTE — Progress Notes (Signed)
   Subjective: 4 Days Post-Op Procedure(s) (LRB): OPEN REDUCTION INTERNAL (ORIF) FIXATION PATELLA (Left) Patient reports pain as 0 on 0-10 scale.   Patient is well, and has had no acute complaints or problems Continue with physical  therapy today.  Plan is to go Rehab after hospital stay. no nausea and no vomiting Patient denies any chest pains or shortness of breath. Nurses note no confusion.  Objective: Vital signs in last 24 hours: Temp:  [99.1 F (37.3 C)-100.1 F (37.8 C)] 100.1 F (37.8 C) (01/03 2015) Pulse Rate:  [59-64] 59 (01/04 0521) Resp:  [17-20] 19 (01/04 0521) BP: (117-149)/(48-60) 117/48 mmHg (01/04 0521) SpO2:  [85 %-100 %] 94 % (01/04 0521) well approximated incision Heels are non tender and elevated off the bed using rolled towels Intake/Output from previous day: 01/03 0701 - 01/04 0700 In: 600 [P.O.:600] Out: 400 [Urine:400] Intake/Output this shift:     Recent Labs  01/12/15 0433 01/13/15 1059  HGB 11.6* 11.8*    Recent Labs  01/12/15 0433 01/13/15 1059  WBC 6.4 8.4  RBC 3.84* 4.01*  HCT 33.8* 35.2*  PLT 100* 127*    Recent Labs  01/13/15 0617  NA 141  K 4.2  CL 105  CO2 28  BUN 21*  CREATININE 1.19  GLUCOSE 103*  CALCIUM 8.8*   No results for input(s): LABPT, INR in the last 72 hours.  EXAM General - Patient is Alert, Appropriate and Oriented Extremity - Neurologically intact Neurovascular intact Sensation intact distally Intact pulses distally Dorsiflexion/Plantar flexion intact Dressing - scant drainage Motor Function - intact, moving foot and toes well on exam.    Past Medical History  Diagnosis Date  . CHF (congestive heart failure) (HCC)     nonischemic cardiomyopathy  . CKD (chronic kidney disease)   . CAD (coronary artery disease)     Nonobstructive cardiac disease by cath  . Ventricular tachycardia (HCC)     s/p ICD  . Hypertension   . Hyperlipidemia     Assessment/Plan: 4 Days Post-Op Procedure(s)  (LRB): OPEN REDUCTION INTERNAL (ORIF) FIXATION PATELLA (Left) Active Problems:   Patellar fracture   Patella fracture  Estimated body mass index is 29.07 kg/(m^2) as calculated from the following:   Height as of this encounter: 5\' 10"  (1.778 m).   Weight as of this encounter: 91.899 kg (202 lb 9.6 oz). Up with therapy Discharge to SNF  [facility] Continue with  Knee immobilizer, WBAT LLE   Labs: reviewed DVT Prophylaxis - Lovenox, Foot Pumps and TED hose Weight-Bearing as tolerated to left leg    Evon Slackhomas C. Gaines PA-C [facility] 01/14/2015, 7:25 AM

## 2015-01-14 NOTE — Clinical Social Work Placement (Signed)
   CLINICAL SOCIAL WORK PLACEMENT  NOTE  Date:  01/14/2015  Patient Details  Name: Spencer Coleman Roland Moquin MRN: [id number] Date of Birth: 11/01/1930  Clinical Social Work is seeking post-discharge placement for this patient at the Skilled  Nursing Facility level of care (*CSW will initial, date and re-position this form in  chart as items are completed):  Yes   Patient/family provided with [place] Clinical Social Work Department's list of facilities offering this level of care within the geographic area requested by the patient (or if unable, by the patient's family).  Yes   Patient/family informed of their freedom to choose among providers that offer the needed level of care, that participate in Medicare, Medicaid or managed care program needed by the patient, have an available bed and are willing to accept the patient.  Yes   Patient/family informed of [facility]'s ownership interest in [facility] and [facility], as well as of the fact that they are under no obligation to receive care at these facilities.  PASRR submitted to EDS on 01/13/15     PASRR number received on 01/13/15     Existing PASRR number confirmed on       FL2 transmitted to all facilities in geographic area requested by pt/family on 01/13/15     FL2 transmitted to all facilities within larger geographic area on       Patient informed that his/her managed care company has contracts with or will negotiate with certain facilities, including the following:        Yes   Patient/family informed of bed offers received.  Patient [facility]     Physician recommends and patient chooses bed at  [facility](SNF)    Patient to be transferred to [facility] on 01/14/15.  Patient to be transferred to facility by [facility]     Patient family notified on 01/14/15 of transfer.  Name of family member notified:  Nelma RothmanDaryl, brother     PHYSICIAN       Additional Comment:     _______________________________________________ Dede QuerySarah Janete Quilling, LCSW 01/14/2015, 1:50 PM

## 2015-01-14 NOTE — Discharge Summary (Signed)
Physician Discharge Summary  Patient ID: Spencer Coleman MRN: [id number] DOB/AGE: 1930-05-24 82 y.o.  Admit date: 01/09/2015 Discharge date: 01/14/2015  Admission Diagnoses:  Left patella fracture, closed, initial encounter [S82.002A] Fracture of the left patella   Discharge Diagnoses: Patient Active Problem List   Diagnosis Date Noted  . Patellar fracture 01/09/2015  . Calculus of kidney 01/09/2015  . Congestive heart failure (HCC) 01/09/2015  . Patella fracture 01/09/2015  . Cardiomyopathy (HCC) 06/06/2013  . History of cardiac catheterization 06/06/2013  . BP (high blood pressure) 06/06/2013  . HLD (hyperlipidemia) 06/06/2013  . Ventricular tachycardia (HCC) 04/05/2013  . Automatic implantable cardioverter-defibrillator in situ 03/25/2013  Fracture of the left patella  Past Medical History  Diagnosis Date  . CHF (congestive heart failure) (HCC)     nonischemic cardiomyopathy  . CKD (chronic kidney disease)   . CAD (coronary artery disease)     Nonobstructive cardiac disease by cath  . Ventricular tachycardia (HCC)     s/p ICD  . Hypertension   . Hyperlipidemia      Transfusion: None   Consultants (if any): Treatment Team:  Dalia Heading, MD Katha Hamming, MD  Discharged Condition: Improved  Hospital Course: Spencer Coleman is an 82 y.o. male who was admitted 01/09/2015 with a diagnosis of Fracture of the left patella and went to the operating room on 01/09/2015 - 01/10/2015 and underwent the above named procedures.    Surgeries: Procedure(s): OPEN REDUCTION INTERNAL (ORIF) FIXATION PATELLA on 01/09/2015 - 01/10/2015 Patient tolerated the surgery well. Taken to PACU where she was stabilized and then transferred to the orthopedic floor.  Started on Lovenox 30mg  q 12 hrs. Foot pumps applied bilaterally at 80 mm. Heels elevated on bed with rolled towels. No evidence of DVT. Negative Homan. Physical therapy started on day #1 for gait training and  transfer. OT started day #1 for ADL and assisted devices.  Patient's IV and Foley were d/c on POD1  Implants: 2 - 2.0 mm K wires, 1.6 mm wire  He was given perioperative antibiotics:  Anti-infectives    Start     Dose/Rate Route Frequency Ordered Stop   01/10/15 1515  ceFAZolin (ANCEF) IVPB 2 g/50 mL premix     2 g 100 mL/hr over 30 Minutes Intravenous Every 6 hours 01/10/15 1503 01/11/15 0846   01/09/15 2015  ceFAZolin (ANCEF) IVPB 2 g/50 mL premix    Comments:  SEND WITH THE PATIENT TO THE OR. DO NOT ADMINISTER ON THE UNIT!   2 g 100 mL/hr over 30 Minutes Intravenous To Surgery 01/09/15 2007 01/10/15 1018    .  He was given sequential compression devices, early ambulation, and Lovenox for DVT prophylaxis.  He benefited maximally from the hospital stay and there were no complications.    Recent vital signs:  Filed Vitals:   01/13/15 2015 01/14/15 0521  BP: 149/60 117/48  Pulse: 63 59  Temp: 100.1 F (37.8 C)   Resp: 20 19    Recent laboratory studies:  Lab Results  Component Value Date   HGB 11.8* 01/13/2015   HGB 11.6* 01/12/2015   HGB 11.7* 01/11/2015   Lab Results  Component Value Date   WBC 8.4 01/13/2015   PLT 127* 01/13/2015   Lab Results  Component Value Date   INR 0.99 01/09/2015   Lab Results  Component Value Date   NA 141 01/13/2015   K 4.2 01/13/2015   CL 105 01/13/2015   CO2 28 01/13/2015   BUN  21* 01/13/2015   CREATININE 1.19 01/13/2015   GLUCOSE 103* 01/13/2015    Discharge Medications:     Medication List    TAKE these medications        amiodarone 400 MG tablet  Commonly known as:  PACERONE  Take 1 tablet by mouth at bedtime.     aspirin EC 325 MG tablet  Take 1 tablet (325 mg total) by mouth daily.     furosemide 20 MG tablet  Commonly known as:  LASIX  Take 1 tablet by mouth daily.     metoprolol 50 MG tablet  Commonly known as:  LOPRESSOR  Take 1 tablet by mouth 2 (two) times daily.     oxyCODONE 5 MG immediate  release tablet  Commonly known as:  Oxy IR/ROXICODONE  Take 1 tablet (5 mg total) by mouth every 6 (six) hours as needed for moderate pain, severe pain or breakthrough pain.     simvastatin 40 MG tablet  Commonly known as:  ZOCOR  Take 1 tablet by mouth daily.        Diagnostic Studies: Dg Chest 2 View  01/12/2015  CLINICAL DATA:  Hypoxia. EXAM: CHEST  2 VIEW COMPARISON:  January 09, 2015. FINDINGS: The heart size and mediastinal contours are within normal limits. Both lungs are clear. Left-sided pacemaker is unchanged in position. No pneumothorax or pleural effusion is noted. Stable elevated right hemidiaphragm is noted. The visualized skeletal structures are unremarkable. IMPRESSION: No active cardiopulmonary disease. Electronically Signed   By: Lupita Raider, M.D.   On: 01/12/2015 16:02   Dg Knee 1-2 Views Left  01/10/2015  CLINICAL DATA:  Operative patellar fracture. EXAM: LEFT KNEE - 1-2 VIEW; DG C-ARM 61-120 MIN COMPARISON:  01/09/2015 FINDINGS: Two images are submitted, demonstrating interval reduction and fixation of patellar fracture with pins and cerclage wires. IMPRESSION: ORIF patellar fracture. Electronically Signed   By: Norva Pavlov M.D.   On: 01/10/2015 13:01   Dg Chest Port 1 View  01/09/2015  CLINICAL DATA:  82 year old male - preoperative respiratory examination prior to knee surgery. EXAM: PORTABLE CHEST 1 VIEW COMPARISON:  10/27/2012 chest radiograph FINDINGS: The cardiomediastinal silhouette is unremarkable. A left-sided pacemaker/ ICD again noted. Elevation of the right hemidiaphragm is unchanged. There is no evidence of focal airspace disease, pulmonary edema, suspicious pulmonary nodule/mass, pleural effusion, or pneumothorax. No acute bony abnormalities are identified. IMPRESSION: No active disease. Electronically Signed   By: Spencer Coleman M.D.   On: 01/09/2015 19:05   Dg Knee Complete 4 Views Left  01/09/2015  CLINICAL DATA:  Fall on concrete. Left patellar  pain and abrasion. Initial encounter. EXAM: LEFT KNEE - COMPLETE 4+ VIEW COMPARISON:  None. FINDINGS: Comminuted fracture is seen involving the patella, with mild displacement of fracture fragments. Prominent prepatellar soft tissue swelling is seen as well as small lipohemarthrosis. No other fractures are identified. No evidence of dislocation. Peripheral vascular calcification noted. IMPRESSION: Comminuted patellar fracture. Associated small lipohemarthrosis and soft tissue swelling. Electronically Signed   By: Myles Rosenthal M.D.   On: 01/09/2015 17:39   Dg C-arm 61-120 Min  01/10/2015  CLINICAL DATA:  Operative patellar fracture. EXAM: LEFT KNEE - 1-2 VIEW; DG C-ARM 61-120 MIN COMPARISON:  01/09/2015 FINDINGS: Two images are submitted, demonstrating interval reduction and fixation of patellar fracture with pins and cerclage wires. IMPRESSION: ORIF patellar fracture. Electronically Signed   By: Norva Pavlov M.D.   On: 01/10/2015 13:01   Disposition:   Pt is stable and  ready for discharge.  Pt's confusion has improved, he denies any confusion today.  Pt will continue to weight-bear as tolerated to the left lower extremity and continue knee immobilizer.  Pt is to follow-up with [facility] in two weeks, will take a daily 325 ASA for DVT prophylaxis.       Follow-up Information    Follow up with WOLFE,JON R., PA In 14 days.   Specialty:  Physician Assistant   Why:  For suture removal, For wound re-check   Contact information:   [address] [facility] [place] [id number] [phone]      Signed: Meriel PicaJames L McGhee PA-C 01/14/2015, 7:53 AM

## 2015-01-14 NOTE — Clinical Social Work Note (Signed)
Clinical Social Work Assessment  Patient Details  Name: Spencer Coleman MRN: [id number] Date of Birth: 1930-10-30  Date of referral:  01/14/15               Reason for consult:  Facility Placement                Permission sought to share information with:  Family Supports Permission granted to share information::  Yes, Verbal Permission Granted  Name::     Daryl, brother   Housing/Transportation Living arrangements for the past 2 months:  Single Family Home Source of Information:  Patient Patient Interpreter Needed:  None Criminal Activity/Legal Involvement Pertinent to Current Situation/Hospitalization:  No - Comment as needed Significant Relationships:  Siblings Lives with:  Self Do you feel safe going back to the place where you live?  Yes Need for family participation in patient care:  Yes (Comment)  Care giving concerns:  No caregiving concerns identified   Facilities manager / plan:  CSW met with pt to address consult for SNF. CSW introduced herself and explained role of social work. CSW also explained process of discharging to SNF.   ED CSW met with pt as well to address discharge plan.   Pt is agreeable to discharge to STR at SNF. Pt chose [facility]. CSW updated facility. [facility] auth started on 01/13/2015.  01/14/2015 Roni Bread obtained. Pt is ready for discharge. Pt and brother are agreeable to dsicharge plan. RN to call report and EMS will provide transportation. CSW is signing off as no further needs identified.   Employment status:  Retired Programmer, applications PT Recommendations:  [place] / Referral to community resources:  [place]  Patient/Family's Response to care:  Pt and brother were appreciative of CSW support.   Patient/Family's Understanding of and Emotional Response to Diagnosis, Current Treatment, and Prognosis:  Pt and brother understand that pt needs placement.    Emotional Assessment Appearance:  Appears stated age Attitude/Demeanor/Rapport:  Other (Appropriate) Affect (typically observed):  Accepting Orientation:  Oriented to Self, Oriented to Place, Oriented to Situation Alcohol / Substance use:  Never Used Psych involvement (Current and /or in the community):  No (Comment)  Discharge Needs  Concerns to be addressed:  No discharge needs identified Readmission within the last 30 days:  No Current discharge risk:  None Barriers to Discharge:  No Barriers Identified   Darden Dates, LCSW 01/14/2015, 1:45 PM

## 2015-01-14 NOTE — Discharge Instructions (Signed)
Diet: As you were doing prior to hospitalization   Shower:  May shower but keep the wounds dry, use an occlusive plastic wrap, NO SOAKING IN TUB.  If the bandage gets wet, change with a clean dry gauze.  Dressing:  You may change your dressing as needed. Change the dressing with sterile gauze dressing.    Activity:  Keep leg locked in extension during ambulation.  Continue with knee immobilizer.  Weight Bearing:   Weight bearing as tolerated to left lower extremity.  To prevent constipation: you may use a stool softener such as -  Colace (over the counter) 100 mg by mouth twice a day  Drink plenty of fluids (prune juice may be helpful) and high fiber foods Miralax (over the counter) for constipation as needed.    Itching:  If you experience itching with your medications, try taking only a single pain pill, or even half a pain pill at a time.  You may take up to 10 pain pills per day, and you can also use benadryl over the counter for itching or also to help with sleep.   Precautions:  If you experience chest pain or shortness of breath - call 911 immediately for transfer to the hospital emergency department!!  If you develop a fever greater that 101 F, purulent drainage from wound, increased redness or drainage from wound, or calf pain-Call [facility]                                               Follow- Up Appointment:  Please [facility]

## 2015-02-03 DIAGNOSIS — Z01810 Encounter for preprocedural cardiovascular examination: Secondary | ICD-10-CM | POA: Insufficient documentation

## 2015-02-06 ENCOUNTER — Ambulatory Visit: Payer: Medicare HMO

## 2015-02-06 ENCOUNTER — Ambulatory Visit: Payer: Medicare HMO | Admitting: Anesthesiology

## 2015-02-06 ENCOUNTER — Encounter: Payer: Self-pay | Admitting: *Deleted

## 2015-02-06 ENCOUNTER — Ambulatory Visit: Admission: RE | Admit: 2015-02-06 | Payer: Medicare HMO | Source: Ambulatory Visit | Admitting: Orthopedic Surgery

## 2015-02-06 ENCOUNTER — Inpatient Hospital Stay
Admission: AD | Admit: 2015-02-06 | Discharge: 2015-02-08 | DRG: 516 | Disposition: A | Discharge status: Home / Self Care | Payer: Medicare HMO | Source: Ambulatory Visit | Attending: Orthopedic Surgery | Admitting: Orthopedic Surgery

## 2015-02-06 ENCOUNTER — Encounter
Admission: AD | Disposition: A | Discharge status: Home / Self Care | Payer: Self-pay | Source: Ambulatory Visit | Attending: Orthopedic Surgery

## 2015-02-06 DIAGNOSIS — Y929 Unspecified place or not applicable: Secondary | ICD-10-CM | POA: Diagnosis not present

## 2015-02-06 DIAGNOSIS — I2510 Atherosclerotic heart disease of native coronary artery without angina pectoris: Secondary | ICD-10-CM | POA: Diagnosis present

## 2015-02-06 DIAGNOSIS — I130 Hypertensive heart and chronic kidney disease with heart failure and stage 1 through stage 4 chronic kidney disease, or unspecified chronic kidney disease: Secondary | ICD-10-CM | POA: Diagnosis present

## 2015-02-06 DIAGNOSIS — E440 Moderate protein-calorie malnutrition: Secondary | ICD-10-CM | POA: Diagnosis present

## 2015-02-06 DIAGNOSIS — I429 Cardiomyopathy, unspecified: Secondary | ICD-10-CM | POA: Diagnosis present

## 2015-02-06 DIAGNOSIS — Y939 Activity, unspecified: Secondary | ICD-10-CM | POA: Diagnosis not present

## 2015-02-06 DIAGNOSIS — Y999 Unspecified external cause status: Secondary | ICD-10-CM

## 2015-02-06 DIAGNOSIS — I509 Heart failure, unspecified: Secondary | ICD-10-CM | POA: Diagnosis present

## 2015-02-06 DIAGNOSIS — X58XXXA Exposure to other specified factors, initial encounter: Secondary | ICD-10-CM | POA: Diagnosis present

## 2015-02-06 DIAGNOSIS — M2500 Hemarthrosis, unspecified joint: Secondary | ICD-10-CM | POA: Diagnosis present

## 2015-02-06 DIAGNOSIS — S82042A Displaced comminuted fracture of left patella, initial encounter for closed fracture: Principal | ICD-10-CM | POA: Diagnosis present

## 2015-02-06 DIAGNOSIS — N189 Chronic kidney disease, unspecified: Secondary | ICD-10-CM | POA: Diagnosis present

## 2015-02-06 DIAGNOSIS — E785 Hyperlipidemia, unspecified: Secondary | ICD-10-CM | POA: Diagnosis present

## 2015-02-06 DIAGNOSIS — S82009A Unspecified fracture of unspecified patella, initial encounter for closed fracture: Secondary | ICD-10-CM | POA: Diagnosis present

## 2015-02-06 DIAGNOSIS — E46 Unspecified protein-calorie malnutrition: Secondary | ICD-10-CM | POA: Insufficient documentation

## 2015-02-06 DIAGNOSIS — Z419 Encounter for procedure for purposes other than remedying health state, unspecified: Secondary | ICD-10-CM

## 2015-02-06 HISTORY — PX: ORIF PATELLA: SHX5033

## 2015-02-06 SURGERY — OPEN REDUCTION INTERNAL FIXATION (ORIF) PATELLA
Anesthesia: Spinal | Site: Knee | Laterality: Left | Wound class: Clean

## 2015-02-06 MED ORDER — PROPOFOL 10 MG/ML IV BOLUS
INTRAVENOUS | Status: DC | PRN
  Administered 2015-02-06 (×2): 10 mg via INTRAVENOUS
  Administered 2015-02-06: 20 mg via INTRAVENOUS

## 2015-02-06 MED ORDER — METOCLOPRAMIDE HCL 10 MG PO TABS
10.0000 mg | ORAL_TABLET | Freq: Three times a day (TID) | ORAL | Status: DC
  Administered 2015-02-06 – 2015-02-08 (×6): 10 mg via ORAL
  Filled 2015-02-06 (×6): qty 1

## 2015-02-06 MED ORDER — ONDANSETRON HCL 4 MG/2ML IJ SOLN
4.0000 mg | Freq: Four times a day (QID) | INTRAMUSCULAR | Status: DC | PRN
Start: 2015-02-06 — End: 2015-02-08

## 2015-02-06 MED ORDER — MORPHINE SULFATE (PF) 2 MG/ML IV SOLN: 2.0000 mg | INTRAVENOUS | Status: DC | PRN

## 2015-02-06 MED ORDER — ONDANSETRON HCL 4 MG/2ML IJ SOLN: 4.0000 mg | Freq: Once | INTRAMUSCULAR | Status: DC | PRN

## 2015-02-06 MED ORDER — BISACODYL 10 MG RE SUPP: 10.0000 mg | Freq: Every day | RECTAL | Status: DC | PRN

## 2015-02-06 MED ORDER — BUPIVACAINE HCL (PF) 0.5 % IJ SOLN
INTRAMUSCULAR | Status: DC | PRN
  Administered 2015-02-06: 2 mL via INTRATHECAL

## 2015-02-06 MED ORDER — PHENYLEPHRINE HCL 10 MG/ML IJ SOLN
INTRAMUSCULAR | Status: DC | PRN
  Administered 2015-02-06: 100 ug via INTRAVENOUS

## 2015-02-06 MED ORDER — DIPHENHYDRAMINE HCL 12.5 MG/5ML PO ELIX: 12.5000 mg | ORAL_SOLUTION | ORAL | Status: DC | PRN

## 2015-02-06 MED ORDER — CEFAZOLIN SODIUM-DEXTROSE 2-3 GM-% IV SOLR
2.0000 g | Freq: Four times a day (QID) | INTRAVENOUS | Status: AC
  Administered 2015-02-06 – 2015-02-07 (×4): 2 g via INTRAVENOUS
  Filled 2015-02-06 (×4): qty 50

## 2015-02-06 MED ORDER — OXYCODONE HCL 5 MG PO TABS: 5.0000 mg | ORAL_TABLET | ORAL | Status: DC | PRN

## 2015-02-06 MED ORDER — LACTATED RINGERS IV SOLN
INTRAVENOUS | Status: DC
  Administered 2015-02-06: 15:00:00 via INTRAVENOUS

## 2015-02-06 MED ORDER — FUROSEMIDE 20 MG PO TABS
20.0000 mg | ORAL_TABLET | Freq: Every day | ORAL | Status: DC
  Administered 2015-02-06 – 2015-02-08 (×3): 20 mg via ORAL
  Filled 2015-02-06 (×3): qty 1

## 2015-02-06 MED ORDER — SENNOSIDES-DOCUSATE SODIUM 8.6-50 MG PO TABS
1.0000 | ORAL_TABLET | Freq: Two times a day (BID) | ORAL | Status: DC
  Administered 2015-02-06 – 2015-02-07 (×3): 1 via ORAL
  Filled 2015-02-06 (×5): qty 1

## 2015-02-06 MED ORDER — SODIUM CHLORIDE 0.9 % IV SOLN
INTRAVENOUS | Status: DC
Start: 2015-02-06 — End: 2015-02-08
  Administered 2015-02-06 (×2): via INTRAVENOUS

## 2015-02-06 MED ORDER — ONDANSETRON HCL 4 MG PO TABS: 4.0000 mg | ORAL_TABLET | Freq: Four times a day (QID) | ORAL | Status: DC | PRN

## 2015-02-06 MED ORDER — CEFAZOLIN SODIUM-DEXTROSE 2-3 GM-% IV SOLR
INTRAVENOUS | Status: AC
  Filled 2015-02-06: qty 50

## 2015-02-06 MED ORDER — EPHEDRINE SULFATE 50 MG/ML IJ SOLN
INTRAMUSCULAR | Status: DC | PRN
  Administered 2015-02-06 (×3): 5 mg via INTRAVENOUS

## 2015-02-06 MED ORDER — MAGNESIUM HYDROXIDE 400 MG/5ML PO SUSP: 30.0000 mL | Freq: Every day | ORAL | Status: DC | PRN

## 2015-02-06 MED ORDER — NEOMYCIN-POLYMYXIN B GU 40-200000 IR SOLN
Status: AC
  Filled 2015-02-06: qty 2

## 2015-02-06 MED ORDER — CEFAZOLIN SODIUM-DEXTROSE 2-3 GM-% IV SOLR
2.0000 g | Freq: Once | INTRAVENOUS | Status: AC
  Administered 2015-02-06: 2 g via INTRAVENOUS

## 2015-02-06 MED ORDER — FERROUS SULFATE 325 (65 FE) MG PO TABS
325.0000 mg | ORAL_TABLET | Freq: Two times a day (BID) | ORAL | Status: DC
Start: 2015-02-07 — End: 2015-02-08
  Administered 2015-02-07 – 2015-02-08 (×3): 325 mg via ORAL
  Filled 2015-02-06 (×3): qty 1

## 2015-02-06 MED ORDER — FLEET ENEMA 7-19 GM/118ML RE ENEM: 1.0000 | ENEMA | Freq: Once | RECTAL | Status: DC | PRN

## 2015-02-06 MED ORDER — ACETAMINOPHEN 10 MG/ML IV SOLN
1000.0000 mg | Freq: Four times a day (QID) | INTRAVENOUS | Status: AC
  Administered 2015-02-06 – 2015-02-07 (×3): 1000 mg via INTRAVENOUS
  Filled 2015-02-06 (×4): qty 100

## 2015-02-06 MED ORDER — NEOMYCIN-POLYMYXIN B GU 40-200000 IR SOLN
Status: DC | PRN
  Administered 2015-02-06: 2 mL

## 2015-02-06 MED ORDER — ACETAMINOPHEN 650 MG RE SUPP: 650.0000 mg | Freq: Four times a day (QID) | RECTAL | Status: DC | PRN

## 2015-02-06 MED ORDER — PANTOPRAZOLE SODIUM 40 MG PO TBEC
40.0000 mg | DELAYED_RELEASE_TABLET | Freq: Two times a day (BID) | ORAL | Status: DC
  Administered 2015-02-06 – 2015-02-08 (×4): 40 mg via ORAL
  Filled 2015-02-06 (×4): qty 1

## 2015-02-06 MED ORDER — AMIODARONE HCL 200 MG PO TABS
400.0000 mg | ORAL_TABLET | Freq: Every day | ORAL | Status: DC
  Administered 2015-02-06 – 2015-02-07 (×2): 400 mg via ORAL
  Filled 2015-02-06 (×2): qty 2

## 2015-02-06 MED ORDER — BUPIVACAINE-EPINEPHRINE (PF) 0.25% -1:200000 IJ SOLN
INTRAMUSCULAR | Status: AC
  Filled 2015-02-06: qty 30

## 2015-02-06 MED ORDER — CHLORHEXIDINE GLUCONATE 4 % EX LIQD: Freq: Once | CUTANEOUS | Status: DC

## 2015-02-06 MED ORDER — MENTHOL 3 MG MT LOZG
1.0000 | LOZENGE | OROMUCOSAL | Status: DC | PRN
  Filled 2015-02-06: qty 9

## 2015-02-06 MED ORDER — PHENOL 1.4 % MT LIQD
1.0000 | OROMUCOSAL | Status: DC | PRN
Start: 2015-02-06 — End: 2015-02-08
  Filled 2015-02-06: qty 177

## 2015-02-06 MED ORDER — ALUM & MAG HYDROXIDE-SIMETH 200-200-20 MG/5ML PO SUSP: 30.0000 mL | ORAL | Status: DC | PRN

## 2015-02-06 MED ORDER — TETRACAINE HCL 1 % IJ SOLN
INTRAMUSCULAR | Status: DC | PRN
  Administered 2015-02-06: 1 mg via INTRASPINAL

## 2015-02-06 MED ORDER — FENTANYL CITRATE (PF) 100 MCG/2ML IJ SOLN: 25.0000 ug | INTRAMUSCULAR | Status: DC | PRN

## 2015-02-06 MED ORDER — PROPOFOL 500 MG/50ML IV EMUL
INTRAVENOUS | Status: DC | PRN
  Administered 2015-02-06: 50 ug/kg/min via INTRAVENOUS

## 2015-02-06 MED ORDER — SIMVASTATIN 40 MG PO TABS
40.0000 mg | ORAL_TABLET | Freq: Every day | ORAL | Status: DC
  Administered 2015-02-06 – 2015-02-08 (×3): 40 mg via ORAL
  Filled 2015-02-06 (×3): qty 1

## 2015-02-06 MED ORDER — ENOXAPARIN SODIUM 30 MG/0.3ML ~~LOC~~ SOLN
30.0000 mg | Freq: Two times a day (BID) | SUBCUTANEOUS | Status: DC
  Administered 2015-02-07 – 2015-02-08 (×3): 30 mg via SUBCUTANEOUS
  Filled 2015-02-06 (×3): qty 0.3

## 2015-02-06 MED ORDER — TETRACAINE HCL 1 % IJ SOLN
INTRAMUSCULAR | Status: AC
  Filled 2015-02-06: qty 2

## 2015-02-06 MED ORDER — ACETAMINOPHEN 325 MG PO TABS
650.0000 mg | ORAL_TABLET | Freq: Four times a day (QID) | ORAL | Status: DC | PRN
  Administered 2015-02-07 – 2015-02-08 (×2): 650 mg via ORAL
  Filled 2015-02-06 (×2): qty 2

## 2015-02-06 MED ORDER — METOPROLOL TARTRATE 50 MG PO TABS
50.0000 mg | ORAL_TABLET | Freq: Two times a day (BID) | ORAL | Status: DC
Start: 2015-02-06 — End: 2015-02-08
  Administered 2015-02-06 – 2015-02-08 (×4): 50 mg via ORAL
  Filled 2015-02-06 (×4): qty 1

## 2015-02-06 SURGICAL SUPPLY — 52 items
BANDAGE ELASTIC 6 LF NS (GAUZE/BANDAGES/DRESSINGS) ×2 IMPLANT
BLADE SURG SZ10 CARB STEEL (BLADE) ×4 IMPLANT
BNDG COHESIVE 4X5 TAN STRL (GAUZE/BANDAGES/DRESSINGS) ×2 IMPLANT
CANISTER SUCT 1200ML W/VALVE (MISCELLANEOUS) ×2 IMPLANT
CHLORAPREP W/TINT 26ML (MISCELLANEOUS) ×4 IMPLANT
DRAPE C-ARM XRAY 36X54 (DRAPES) ×2 IMPLANT
DRAPE C-ARMOR (DRAPES) ×2 IMPLANT
DRAPE INCISE IOBAN 66X45 STRL (DRAPES) ×2 IMPLANT
DRAPE U-SHAPE 47X51 STRL (DRAPES) ×2 IMPLANT
DRSG OPSITE POSTOP 4X10 (GAUZE/BANDAGES/DRESSINGS) ×4 IMPLANT
ELECT CAUTERY BLADE 6.4 (BLADE) ×2 IMPLANT
ELECT REM PT RETURN 9FT ADLT (ELECTROSURGICAL) ×2
ELECTRODE REM PT RTRN 9FT ADLT (ELECTROSURGICAL) ×1 IMPLANT
GAUZE PETRO XEROFOAM 1X8 (MISCELLANEOUS) ×2 IMPLANT
GAUZE SPONGE 4X4 12PLY STRL (GAUZE/BANDAGES/DRESSINGS) ×2 IMPLANT
GLOVE BIO SURGEON STRL SZ7.5 (GLOVE) ×2 IMPLANT
GLOVE BIO SURGEON STRL SZ8 (GLOVE) ×2 IMPLANT
GLOVE BIOGEL M STRL SZ7.5 (GLOVE) ×2 IMPLANT
GLOVE SURG XRAY 8.5 LX (GLOVE) ×2 IMPLANT
GOWN SPECIALTY ULTRA XL (MISCELLANEOUS) ×2 IMPLANT
GOWN STRL REUS W/ TWL LRG LVL3 (GOWN DISPOSABLE) ×1 IMPLANT
GOWN STRL REUS W/TWL LRG LVL3 (GOWN DISPOSABLE) ×1
GUIDEWARE NON THREAD 1.25X150 (WIRE) ×2
GUIDEWIRE NON THREAD 1.25X150 (WIRE) ×1 IMPLANT
HANDLE YANKAUER SUCT BULB TIP (MISCELLANEOUS) ×2 IMPLANT
HEMOVAC 400CC 10FR (MISCELLANEOUS) ×2 IMPLANT
IMMOB KNEE 24 THIGH 24 443303 (SOFTGOODS) ×2 IMPLANT
IV CATH ANGIO 14GX3.25 ORG (MISCELLANEOUS) ×2 IMPLANT
NS IRRIG 500ML POUR BTL (IV SOLUTION) ×2 IMPLANT
PACK EXTREMITY ARMC (MISCELLANEOUS) ×2 IMPLANT
PAD ABD DERMACEA PRESS 5X9 (GAUZE/BANDAGES/DRESSINGS) ×6 IMPLANT
PAD CAST CTTN 4X4 STRL (SOFTGOODS) ×4 IMPLANT
PAD WRAPON POLAR KNEE (MISCELLANEOUS) ×1 IMPLANT
PADDING CAST COTTON 4X4 STRL (SOFTGOODS) ×4
SCREW SHORT THREAD 4.0X46 (Screw) ×2 IMPLANT
SCREW SHORT THREAD 4.0X48 (Screw) ×2 IMPLANT
SPONGE LAP 18X18 5 PK (GAUZE/BANDAGES/DRESSINGS) ×2 IMPLANT
STAPLER SKIN PROX 35W (STAPLE) ×2 IMPLANT
STOCKINETTE M/LG 89821 (MISCELLANEOUS) ×2 IMPLANT
STRAP SAFETY BODY (MISCELLANEOUS) ×2 IMPLANT
SUT FIBERWIRE #5 38 CONV BLUE (SUTURE)
SUT ORTHOCORD W/MULTIPK NDL (SUTURE) ×2 IMPLANT
SUT STEEL 7 (SUTURE) ×2 IMPLANT
SUT VIC AB 0 CT1 27 (SUTURE) ×1
SUT VIC AB 0 CT1 27XCR 8 STRN (SUTURE) ×1 IMPLANT
SUT VIC AB 0 CT1 36 (SUTURE) ×2 IMPLANT
SUT VIC AB 1 CT1 36 (SUTURE) ×2 IMPLANT
SUT VIC AB 2-0 CT1 27 (SUTURE)
SUT VIC AB 2-0 CT1 TAPERPNT 27 (SUTURE) IMPLANT
SUTURE FIBERWR #5 38 CONV BLUE (SUTURE) IMPLANT
SYRINGE 10CC LL (SYRINGE) ×2 IMPLANT
WRAPON POLAR PAD KNEE (MISCELLANEOUS) ×2

## 2015-02-06 NOTE — Transfer of Care (Signed)
Immediate Anesthesia Transfer of Care Note  Patient: Spencer Coleman  Procedure(s) Performed: Procedure(s): OPEN REDUCTION INTERNAL (ORIF) FIXATION PATELLA (Left)  Patient Location: PACU  Anesthesia Type:Spinal  Level of Consciousness: awake, alert  and oriented  Airway & Oxygen Therapy: Patient Spontanous Breathing  Post-op Assessment: Report given to RN and Post -op Vital signs reviewed and stable  Post vital signs: Reviewed and stable  Last Vitals:  Filed Vitals:   02/06/15 0754 02/06/15 1749  BP: 114/49 133/63  Pulse: 78   Temp: 36.7 C 35.8 C  Resp: 16 20    Complications: No apparent anesthesia complications

## 2015-02-06 NOTE — Op Note (Signed)
OPERATIVE NOTE  DATE OF SURGERY:  02/06/2015  PATIENT NAME:  Spencer Coleman   DOB: [birth year]  MRN: [id number]  PRE-OPERATIVE DIAGNOSIS: Fracture of the left patella, comminuted  POST-OPERATIVE DIAGNOSIS:  Same  PROCEDURE:  Open reduction and internal fixation of the left patella  SURGEON:  Jena Gauss. M.D.  ASSISTANT:  None  ANESTHESIA: spinal  ESTIMATED BLOOD LOSS: 25 mL  FLUIDS REPLACED: 1200 mL of crystalloid  TOURNIQUET TIME: 59 minutes  DRAINS: None  IMPLANTS UTILIZED: 2 - 4.0 mm cannulated partially threaded cancellous screws, 1.6 mm wire  INDICATIONS FOR SURGERY: Spencer Coleman is a 82 y.o. year old male who fell and sustained a fracture of the left patella approximately 4 weeks ago. He underwent open reduction and internal fixation of the comminuted left patella fracture and did well initially. He was transferred to the skilled nursing facility at [facility]. Apparently the knee brace was removed and their physical therapy staff started aggressive range of motion exercises. Upon follow-up in the office he was noted to have displacement of the previously reduced fracture fragments with backing out of the K wires.. After discussion of the risks and benefits of surgical intervention, the patient expressed understanding of the risks benefits and agree with plans for open reduction and internal fixation of the fracture.   The risks, benefits, and alternatives were discussed at length including but not limited to the risks of infection, bleeding, nerve injury, stiffness, blood clots, the need for revision surgery, cardiopulmonary complications, among others, and they were willing to proceed.  PROCEDURE IN DETAIL: The patient was brought into the operating room and, after adequate spinal anesthesia was achieved, a tourniquet was placed on the patient's upper thigh. The patient's knee and leg were cleaned and prepped with alcohol and ChloraPrep and draped in the  usual sterile fashion. A "timeout" was performed as per usual protocol. The lower extremity was exsanguinated using an Esmarch, and the tourniquet was inflated to 300 mmHg. An anterior longitudinal incision was made through the previous surgical scar and dissection was carried down to the retinaculum. A hemarthrosis was evacuated and the wound was irrigated with copious amounts of normal saline with antibiotic solution. The fracture site was carefully debrided of hematoma and soft tissue. The fracture was provisionally reduced using a reduction forceps with points with good reduction appreciated using C-arm. The 2 previously placed K wires were removed as was the tension band wire and the 1.6 mm guidewires for the cannulated screws were inserted through the drill holes. Measurements were obtained and two 4.0 mm cannulated partially threaded cancellous screws were inserted over the guidewires with good purchase appreciated. The guidewires were removed and 1.6 mm wire was inserted and crisscrossed in a figure-of-eight fashion and then tensioned in a sequential fashion medially and laterally. This allowed for excellent compression at the fracture site. Excellent reduction and position of the hardware was noted in both AP and lateral planes using the C-arm. Tourniquet was deflated after total tourniquet time of 59 minutes. Hemostasis was achieved using electrocautery. Subcutaneous tissue was approximated in layers using first #0 Vicryl followed by #2-0 Vicryl. Skin was closed with skin staples. 0.25% Marcaine with epinephrine was injected along the incision site. A sterile dressing was applied followed by application of a knee range of motion brace which was locked in extension.  The patient tolerated the procedure well and was transported to the recovery room in stable condition.  James P. Angie Fava M.D.

## 2015-02-06 NOTE — Brief Op Note (Addendum)
02/06/2015  6:03 PM  PATIENT:  [name]  82 y.o. male  PRE-OPERATIVE DIAGNOSIS:  CLOSED DISPLACED COMMINUTED FRACTURE OF LEFT PATELLA  POST-OPERATIVE DIAGNOSIS:  Same  PROCEDURE:  Procedure(s): OPEN REDUCTION INTERNAL (ORIF) FIXATION PATELLA (Left)  SURGEON:  Surgeon(s) and Role:    * Donato Heinz, MD - Primary  ASSISTANTS: none   ANESTHESIA:   spinal  EBL:  Total I/O In: 1200 [I.V.:1200] Out: 75 [Urine:50; Blood:25]  BLOOD ADMINISTERED:none  DRAINS: none   LOCAL MEDICATIONS USED:  NONE  SPECIMEN:  No Specimen  DISPOSITION OF SPECIMEN:  N/A  COUNTS:  YES  TOURNIQUET:   59 minutes  DICTATION: .Dragon Dictation  PLAN OF CARE: Admit for overnight observation  PATIENT DISPOSITION:  PACU - hemodynamically stable.   Delay start of Pharmacological VTE agent (>24hrs) due to surgical blood loss or risk of bleeding: yes

## 2015-02-06 NOTE — Anesthesia Preprocedure Evaluation (Addendum)
Anesthesia Evaluation  Patient identified by MRN, date of birth, ID band Patient awake    Reviewed: Allergy & Precautions, NPO status , Patient's Chart, lab work & pertinent test results  History of Anesthesia Complications Negative for: history of anesthetic complications (decreased mental function hx by family member)  Airway Mallampati: II       Dental  (+) Upper Dentures, Edentulous Lower   Pulmonary neg pulmonary ROS,           Cardiovascular hypertension, Pt. on medications + CAD and +CHF  + Cardiac Defibrillator      Neuro/Psych negative neurological ROS  negative psych ROS   GI/Hepatic negative GI ROS,   Endo/Other  negative endocrine ROS  Renal/GU CRFRenal disease     Musculoskeletal negative musculoskeletal ROS (+)   Abdominal   Peds  Hematology negative hematology ROS (+)   Anesthesia Other Findings   Reproductive/Obstetrics                            Anesthesia Physical Anesthesia Plan  ASA: III  Anesthesia Plan: Spinal   Post-op Pain Management:    Induction:   Airway Management Planned:   Additional Equipment:   Intra-op Plan:   Post-operative Plan:   Informed Consent: I have reviewed the patients History and Physical, chart, labs and discussed the procedure including the risks, benefits and alternatives for the proposed anesthesia with the patient or authorized representative who has indicated his/her understanding and acceptance.     Plan Discussed with:   Anesthesia Plan Comments:        Anesthesia Quick Evaluation

## 2015-02-06 NOTE — Progress Notes (Signed)
Patient here with son and brother via wheelchair.  Pt. Came from [facility], had sacral dressing in place, Brace in place to left leg.

## 2015-02-06 NOTE — Anesthesia Postprocedure Evaluation (Signed)
Anesthesia Post Note  Patient: Spencer Coleman  Procedure(s) Performed: Procedure(s) (LRB): OPEN REDUCTION INTERNAL (ORIF) FIXATION PATELLA (Left)  Patient location during evaluation: PACU Anesthesia Type: General Level of consciousness: awake and alert Pain management: pain level controlled Vital Signs Assessment: post-procedure vital signs reviewed and stable Respiratory status: spontaneous breathing, nonlabored ventilation, respiratory function stable and patient connected to nasal cannula oxygen Cardiovascular status: blood pressure returned to baseline and stable Postop Assessment: no signs of nausea or vomiting Anesthetic complications: no    Last Vitals:  Filed Vitals:   02/06/15 1804 02/06/15 1819  BP: 130/59 137/61  Pulse: 59 58  Temp:    Resp: 13 20    Last Pain:  Filed Vitals:   02/06/15 1820  PainSc: Asleep                 Laytoya Ion S

## 2015-02-06 NOTE — Anesthesia Procedure Notes (Signed)
Spinal Patient location during procedure: OR Start time: 02/06/2015 3:19 PM End time: 02/06/2015 3:32 PM Preanesthetic Checklist Completed: patient identified, site marked, surgical consent, pre-op evaluation, timeout performed, IV checked, risks and benefits discussed and monitors and equipment checked Spinal Block Patient position: sitting Prep: Betadine Patient monitoring: heart rate, continuous pulse ox, blood pressure and cardiac monitor Approach: midline Location: L4-5 Injection technique: single-shot Needle Needle type: Whitacre and Introducer  Needle gauge: 24 G Needle length: 9 cm Needle insertion depth: 6 cm Additional Notes Negative paresthesia. Negative blood return. Positive free-flowing CSF. Expiration date of kit checked and confirmed. Patient tolerated procedure well, without complications.

## 2015-02-06 NOTE — H&P (Signed)
The patient has been re-examined, and the chart reviewed, and there have been no interval changes to the documented history and physical.    The risks, benefits, and alternatives have been discussed at length. The patient expressed understanding of the risks benefits and agreed with plans for surgical intervention.  James P. Hooten, Jr. M.D.    

## 2015-02-07 LAB — CBC
HEMATOCRIT: 36.8 % — AB (ref 40.0–52.0)
HEMOGLOBIN: 12 g/dL — AB (ref 13.0–18.0)
MCH: 28.6 pg (ref 26.0–34.0)
MCHC: 32.7 g/dL (ref 32.0–36.0)
MCV: 87.7 fL (ref 80.0–100.0)
Platelets: 114 10*3/uL — ABNORMAL LOW (ref 150–440)
RBC: 4.2 MIL/uL — ABNORMAL LOW (ref 4.40–5.90)
RDW: 14.6 % — ABNORMAL HIGH (ref 11.5–14.5)
WBC: 5.4 10*3/uL (ref 3.8–10.6)

## 2015-02-07 LAB — BASIC METABOLIC PANEL
ANION GAP: 4 — AB (ref 5–15)
BUN: 18 mg/dL (ref 6–20)
CHLORIDE: 105 mmol/L (ref 101–111)
CO2: 33 mmol/L — AB (ref 22–32)
Calcium: 8.2 mg/dL — ABNORMAL LOW (ref 8.9–10.3)
Creatinine, Ser: 1.46 mg/dL — ABNORMAL HIGH (ref 0.61–1.24)
GFR calc non Af Amer: 43 mL/min — ABNORMAL LOW (ref >60)
GFR, EST AFRICAN AMERICAN: 50 mL/min — AB (ref >60)
GLUCOSE: 99 mg/dL (ref 65–99)
Potassium: 3.7 mmol/L (ref 3.5–5.1)
Sodium: 142 mmol/L (ref 135–145)

## 2015-02-07 NOTE — Progress Notes (Signed)
   Subjective: 1 Day Post-Op Procedure(s) (LRB): OPEN REDUCTION INTERNAL (ORIF) FIXATION PATELLA (Left) Patient reports pain as mild.   Patient is well, and has had no acute complaints or problems Denies any CP, SOB, ABD pain. We will continue therapy today.  Plan is to go Home after hospital stay.  Objective: Vital signs in last 24 hours: Temp:  [96.4 F (35.8 C)-98.7 F (37.1 C)] 97.9 F (36.6 C) (01/28 0754) Pulse Rate:  [58-81] 67 (01/28 0754) Resp:  [13-20] 16 (01/28 0754) BP: (123-160)/(57-76) 147/71 mmHg (01/28 0754) SpO2:  [90 %-100 %] 91 % (01/28 0754) FiO2 (%):  [2 %] 2 % (01/28 0434) Weight:  [87.091 kg (192 lb)] 87.091 kg (192 lb) (01/27 1931)  Intake/Output from previous day: 01/27 0701 - 01/28 0700 In: 1400 [I.V.:1400] Out: 625 [Urine:600; Blood:25] Intake/Output this shift: Total I/O In: 50 [IV Piggyback:50] Out: -    Recent Labs  02/07/15 0401  HGB 12.0*    Recent Labs  02/07/15 0401  WBC 5.4  RBC 4.20*  HCT 36.8*  PLT 114*    Recent Labs  02/07/15 0401  NA 142  K 3.7  CL 105  CO2 33*  BUN 18  CREATININE 1.46*  GLUCOSE 99  CALCIUM 8.2*   No results for input(s): LABPT, INR in the last 72 hours.  EXAM General - Patient is Alert, Appropriate and Oriented Extremity - Neurovascular intact Sensation intact distally Intact pulses distally Dorsiflexion/Plantar flexion intact Dressing - dressing C/D/I and no drainage. Knee immobilizer intact Motor Function - intact, moving foot and toes well on exam.  Past Medical History  Diagnosis Date  . CHF (congestive heart failure) (HCC)     nonischemic cardiomyopathy  . CKD (chronic kidney disease)   . CAD (coronary artery disease)     Nonobstructive cardiac disease by cath  . Ventricular tachycardia (HCC)     s/p ICD  . Hypertension   . Hyperlipidemia     Assessment/Plan:   1 Day Post-Op Procedure(s) (LRB): OPEN REDUCTION INTERNAL (ORIF) FIXATION PATELLA (Left) Active Problems:  Patellar fracture  Estimated body mass index is 27.55 kg/(m^2) as calculated from the following:   Height as of 01/09/15:  (1.778 m).   Weight as of this encounter: 87.091 kg (192 lb). Advance diet Up with therapy , Continue with Knee hinged brace. Must stay locked in extension at all times. WBAT LLE Plan on discharge to home with home health  DVT Prophylaxis - Lovenox, Foot Pumps and TED hose Weight-Bearing as tolerated to left leg D/C O2 and Pulse OX and try on Room Air  T. Cranston Neighbor, PA-C [facility] 02/07/2015, 9:27 AM

## 2015-02-07 NOTE — Progress Notes (Signed)
  Clinical Child psychotherapist (CSW) received SNF consult. PT is recommending home health. RN Case Manager aware of above. Please reconsult if future social work needs arise. CSW signing off.  Sammuel Hines. LCSWA Clinical Social Work Department [phone] 4:23 PM

## 2015-02-07 NOTE — Care Management Note (Signed)
Case Management Note  Patient Details  Name: Spencer Coleman MRN: [id number] Date of Birth: 08-02-1930  Subjective/Objective:      [name] will be discharged home with home health per Dr Ernest Pine. [name] has a rolling walker and a bedside commode. Lovenox  SQ x 14 days was called to the [facility] on [address] this morning. [name] wants to think about which home health provider to choose for home health PT. A referral will be made as soon as he makes a choice.             Action/Plan:   Expected Discharge Date:                  Expected Discharge Plan:     In-House Referral:     Discharge planning Services     Post Acute Care Choice:    Choice offered to:     DME Arranged:    DME Agency:     HH Arranged:    HH Agency:     Status of Service:     Medicare Important Message Given:    Date Medicare IM Given:    Medicare IM give by:    Date Additional Medicare IM Given:    Additional Medicare Important Message give by:     If discussed at Long Length of Stay Meetings, dates discussed:    Additional Comments:  [name] A, RN 02/07/2015, 10:31 AM

## 2015-02-07 NOTE — Progress Notes (Signed)
Initial Nutrition Assessment  DOCUMENTATION CODES:   Non-severe (moderate) malnutrition in context of acute illness/injury  INTERVENTION:   Meals and Snacks: Cater to patient preferences; encourage smaller, more frequent meals to maximize nutritional intake. Pt agreeable to snacks, will send BID Medical Food Supplement Therapy: pt offered nutritional supplements but does not like/want, reassess on follow-up  NUTRITION DIAGNOSIS:   Malnutrition related to acute illness as evidenced by percent weight loss, mild depletion of muscle mass, energy intake < 75% for > or equal to 1 month.  GOAL:   Patient will meet greater than or equal to 90% of their needs  MONITOR:    (Energy Intake, Anthropometrics, Digestive System, Electrolyte/Renal Profile)  REASON FOR ASSESSMENT:   Malnutrition Screening Tool    ASSESSMENT:    Pt admitted with left pattellar fracture s/p ORIF  Past Medical History  Diagnosis Date  . CHF (congestive heart failure) (HCC)     nonischemic cardiomyopathy  . CKD (chronic kidney disease)   . CAD (coronary artery disease)     Nonobstructive cardiac disease by cath  . Ventricular tachycardia (HCC)     s/p ICD  . Hypertension   . Hyperlipidemia     Diet Order:  Diet regular Room service appropriate?: Yes; Fluid consistency:: Thin   Energy Intake: pt ate good breakfast this AM (reports he loved the sausage biscuit) and ate 1/2 sandwich and some Pepsi for lunch today; pt reports fair appetite  Food and Nutrition related history: family reporting pt has not been eating well since last surgery (reports 01/17/15), pt was discharged at that time to rehab and has been eating poorly at rehab. Pt not taking nutritional supplements  Skin:   (no pressure ulcers)  Nutrition Focused Physical Exam: Nutrition-Focused physical exam completed. Findings are no fat depletion, mild muscle depletion, and no edema. Unable to exam LE at time of assessment   Height:   Ht  Readings from Last 1 Encounters:  01/09/15  (1.778 m)    Weight: 5% wt loss in <1 month; family reports 8.1% wt loss in same time frame per weight encounters  Wt Readings from Last 1 Encounters:  02/06/15 192 lb (87.091 kg)    Wt Readings from Last 10 Encounters:   02/06/15 192 lb (87.091 kg)   01/09/15 202 lb 9.6 oz (91.899 kg)    BMI:  Body mass index is 27.55 kg/(m^2).  Estimated Nutritional Needs:   Kcal:  1308-6578 kcals (BEE 1572, 1.3 AF, 1.0-1.2 IF)   Protein:  87-104 g (1.0-1.2 g/kg)   Fluid:  2175-2610 mL (25-30 ml/kg)     MODERATE Care Level  Romelle Starcher MS, RD, LDN [phone] Pager  [phone] Weekend/On-Call Pager

## 2015-02-07 NOTE — Progress Notes (Signed)
Physical Therapy Evaluation Patient Details Name: LAVANTE TOSO MRN: [id number] DOB: 06/20/1930 Today's Date: 02/07/2015   History of Present Illness  Spencer Coleman is a 82 y.o. year old male who fell and sustained a fracture of the left patella approximately 4 weeks ago. He underwent open reduction and internal fixation of the comminuted left patella fracture and did well initially. He was transferred to the skilled nursing facility at [facility]. Apparently the knee brace was removed and their physical therapy staff started aggressive range of motion exercises. Upon follow-up in the office he was noted to have displacement of the previously reduced fracture fragments with backing out of the K wires.. After discussion of the risks and benefits of surgical intervention, the patient expressed understanding of the risks benefits and agree with plans for open reduction and internal fixation of the fracture.   Clinical Impression  Pt is a very pleasant 82 yo male s/p repeat ORIF of L patella.  Pt is WBAT and must wear KI locked in extension at all times.  Pt demonstrated good safety awareness with all mobility this session and was able to follow commands with all tasks.  Pt is very motivated to go home and was able to demonstrate transfers with CGA and ambulate 8' with CGA using RW.  Pt would benefit from acute PT services to address objective findings and to return home at discharge.    Follow Up Recommendations Home health PT    Equipment Recommendations  Rolling walker with 5" wheels;3in1 (PT)    Recommendations for Other Services       Precautions / Restrictions Precautions Precautions: Fall Required Braces or Orthoses: Knee Immobilizer - Left Knee Immobilizer - Left: On at all times (locked in extension) Restrictions Weight Bearing Restrictions: Yes LLE Weight Bearing: Weight bearing as tolerated      Mobility  Bed Mobility Overal bed mobility: Needs Assistance Bed  Mobility: Supine to Sit     Supine to sit: Min guard     General bed mobility comments: HOB elevated, able to swing legs over to R side of bed, uses R LE to assist L LE under ankle  Transfers Overall transfer level: Needs assistance Equipment used: Rolling walker (2 wheeled) Transfers: Sit to/from Stand Sit to Stand: Min assist;Min guard         General transfer comment: Min A on first attempt standing, then with improved body mechanics and pushing from bed able to stand with [employer]  Ambulation/Gait Ambulation/Gait assistance: Min guard Ambulation Distance (Feet): 8 Feet Assistive device: Rolling walker (2 wheeled) Gait Pattern/deviations: Step-to pattern;Antalgic     General Gait Details: Good safety awareness and control of both LE and RW while negotiating furniture in room, around bed and turning to sit in chair.  Stairs            Wheelchair Mobility    Modified Rankin (Stroke Patients Only)       Balance Overall balance assessment: Needs assistance Sitting-balance support: Feet supported Sitting balance-Leahy Scale: Good   Postural control: Posterior lean Standing balance support: Bilateral upper extremity supported;During functional activity Standing balance-Leahy Scale: Fair                               Pertinent Vitals/Pain Pain Assessment: 0-10 Pain Location: L knee, not rated Pain Descriptors / Indicators: Aching Pain Intervention(s): Limited activity within patient's tolerance;Monitored during session    Home Living Family/patient  expects to be discharged to:: Private residence Living Arrangements: Children Available Help at Discharge: Family (brother and son) Type of Home: House Home Access: Level entry     Home Layout: One level Home Equipment: Environmental consultant - 2 wheels;Cane - single point;Wheelchair - manual (lift chair)      Prior Function Level of Independence: Independent         Comments: includes driving and  mowing lawn, prior to first surgery on 12/31/14     Hand Dominance        Extremity/Trunk Assessment   Upper Extremity Assessment: Overall WFL for tasks assessed           Lower Extremity Assessment: RLE deficits/detail;LLE deficits/detail RLE Deficits / Details: 4/5 strength, decreased quad control especially with terminal knee extension LLE Deficits / Details: Able to complete one partial SLR     Communication   Communication: No difficulties  Cognition Arousal/Alertness: Awake/alert Behavior During Therapy: WFL for tasks assessed/performed Overall Cognitive Status: Within Functional Limits for tasks assessed                      General Comments      Exercises General Exercises - Lower Extremity Ankle Circles/Pumps: Both;10 reps Quad Sets: Both;10 reps Straight Leg Raises: AAROM;Left;Both;10 reps      Assessment/Plan    PT Assessment Patient needs continued PT services  PT Diagnosis Difficulty walking;Generalized weakness;Acute pain   PT Problem List Decreased strength;Decreased range of motion;Decreased balance;Decreased mobility  PT Treatment Interventions Gait training;Functional mobility training;Therapeutic activities;Therapeutic exercise;Balance training;Patient/family education   PT Goals (Current goals can be found in the Care Plan section) Acute Rehab PT Goals Patient Stated Goal: "I want to go home." PT Goal Formulation: With patient Time For Goal Achievement: 02/21/15 Potential to Achieve Goals: Good    Frequency 7X/week (1-2X/day)   Barriers to discharge        Co-evaluation               End of Session                 Time: 1610-9604 PT Time Calculation (min) (ACUTE ONLY): 39 min   Charges:   PT Evaluation $PT Eval Low Complexity: 1 Procedure PT Treatments $Therapeutic Exercise: 8-22 mins   PT G Codes:        Elenore Wanninger A Alma Mohiuddin 02-26-15, 11:41 AM

## 2015-02-07 NOTE — Care Management Note (Signed)
Case Management Note  Patient Details  Name: KYLAND NO MRN: [id number] Date of Birth: May 09, 1930  Subjective/Objective:     Called RX for Lovenox  SQ #14 to [facility] on [address] in [place]. Pharmacist reports that they cannot run the Lovenox for co-pay cost until Mr Lapoint brings his 2007 insurance card or Insurance verification to the [employer].               Action/Plan:   Expected Discharge Date:                  Expected Discharge Plan:     In-House Referral:     Discharge planning Services     Post Acute Care Choice:    Choice offered to:     DME Arranged:    DME Agency:     HH Arranged:    HH Agency:     Status of Service:     Medicare Important Message Given:    Date Medicare IM Given:    Medicare IM give by:    Date Additional Medicare IM Given:    Additional Medicare Important Message give by:     If discussed at Long Length of Stay Meetings, dates discussed:    Additional Comments:  Kingston Guiles A, RN 02/07/2015, 2:28 PM

## 2015-02-07 NOTE — Evaluation (Addendum)
Occupational Therapy Evaluation Patient Details Name: Spencer Coleman MRN: [id number] DOB: [birth year] Today's Date: 02/07/2015    History of Present Illness Spencer Coleman is a 82 y.o. year old male who fell and sustained a fracture of the left patella approximately 4 weeks ago. He underwent open reduction and internal fixation of the comminuted left patella fracture and did well initially. He was transferred to the skilled nursing facility at [facility]. Apparently the knee brace was removed and their physical therapy staff started aggressive range of motion exercises. Upon follow-up in the offiice he was noted to have displacement of the previously reduced fracture fragments with backing out of the K wires.Marland KitchenHe was admitted to [facility] for revision of surgery for left patella.  His plan is to go home after hospital stay this time wtih help from his son.     Clinical Impression   Patient seen for OT evaluation this date, he lives at home in a one story house with no steps to enter, his son lives with him.  He was independent with all tasks prior to first surgery in January including driving, yardwork and restoring cars.  He wants to be able to return home with his son and be as independent as possible but has decided he may give up restoring cars and higher level IADL tasks such as yardwork.  He presents now with decreased ROM, strength in LLE, decreased transfers and functional mobility which affect his basic self care tasks.  He is currently requiring increased assistance with lower body bathing, dressing and toileting skills and would benefit from skilled OT to maximize his safety and independence to return home.  He may not require follow up OT in the home depending on his progress in the next 1-2 days and how much son will be there to assist.      Follow Up Recommendations  No OT follow up (depending on progress and assist at home)    Equipment Recommendations       Recommendations for  Other Services       Precautions / Restrictions Precautions Precautions: Fall Required Braces or Orthoses: Knee Immobilizer - Left Knee Immobilizer - Left: On at all times Restrictions Weight Bearing Restrictions: Yes LLE Weight Bearing: Weight bearing as tolerated      Mobility Bed Mobility             Transfers Overall transfer level: Needs assistance Equipment used: Rolling walker (2 wheeled) Transfers: Sit to/from Stand Sit to Stand: Min guard         General transfer comment: Min A on first attempt standing, then with improved body mechanics and pushing from bed able to stand with Min Guard per PT    Balance Overall balance assessment: Needs assistance Sitting-balance support: Feet supported Sitting balance-Leahy Scale: Good  Per PT Postural control: Posterior lean Standing balance support: Bilateral upper extremity supported;During functional activity Standing balance-Leahy Scale: Fair                              ADL Overall ADL's : Needs assistance/impaired Eating/Feeding: Modified independent   Grooming: Sitting Grooming Details (indicate cue type and reason): all grooming tasks performed in sitting, will need CGA to SBA for standing tasks.  Upper Body Bathing: Modified independent   Lower Body Bathing: Minimal assistance   Upper Body Dressing : Set up   Lower Body Dressing: Set up;Moderate assistance Lower Body Dressing Details (indicate cue  type and reason): Will benefit from using adaptive equipment to manage left LLE.  Can reach down to RLE to don and doff sock. Toilet Transfer: Hydrographic surveyor Details (indicate cue type and reason): simulated this date with walker. Toileting- Architect and Hygiene: Building services engineer      Pertinent Vitals/Pain Pain Assessment: 0-10 Pain Score: 6  Pain Location: left knee, none when sitting but 6/10 for movement, KI on at  all times.  Pain Descriptors / Indicators: Aching Pain Intervention(s): Limited activity within patient's tolerance;Monitored during session;Repositioned     Hand Dominance Right   Extremity/Trunk Assessment Upper Extremity Assessment Upper Extremity Assessment: Overall WFL for tasks assessed   Lower Extremity Assessment Lower Extremity Assessment: Defer to PT evaluation RLE Deficits / Details: 4/5 strength, decreased quad control especially with terminal knee extension LLE Deficits / Details: Able to complete one partial SLR LLE: Unable to fully assess due to immobilization       Communication Communication Communication: No difficulties   Cognition Arousal/Alertness: Awake/alert Behavior During Therapy: WFL for tasks assessed/performed Overall Cognitive Status: Within Functional Limits for tasks assessed                     General Comments       Exercises Exercises: General Lower Extremity     Shoulder Instructions      Home Living Family/patient expects to be discharged to:: Private residence Living Arrangements: Children Available Help at Discharge: Family (son at home who is semi retired and able to help with tasks.) Type of Home: House Home Access: Level entry     Home Layout: One level     Bathroom Shower/Tub: Chief Strategy Officer: Handicapped height     Home Equipment: Environmental consultant - 2 wheels;Cane - single point;Wheelchair - manual          Prior Functioning/Environment Level of Independence: Independent        Comments: He reported being independent in higher level IADL tasks including driving and yardwork.      OT Diagnosis: Generalized weakness;Other (comment);Acute pain (decreased ability to perform self care tasks. )   OT Problem List: Pain;Decreased range of motion;Decreased strength;Impaired balance (sitting and/or standing);Decreased knowledge of use of DME or AE;Other (comment) (decreased strength and ROM in LLE  affecting self care)   OT Treatment/Interventions:      OT Goals(Current goals can be found in the care plan section) Acute Rehab OT Goals Patient Stated Goal: Patient states he wants to be able to take care of himself again and do the basics.  "My son will help with all the other stuff." OT Goal Formulation: With patient Time For Goal Achievement: 02/14/15 Potential to Achieve Goals: Good  OT Frequency:     Barriers to D/C:            Co-evaluation              End of Session Equipment Utilized During Treatment: Gait belt;Left knee immobilizer  Activity Tolerance: Patient tolerated treatment well Patient left: in chair;with call bell/phone within reach;with chair alarm set   Time: 1110-1144 OT Time Calculation (min): 34 min Charges:  OT General Charges $OT Visit: 1 Procedure OT Evaluation $OT Eval Low Complexity: 1 Procedure OT Treatments $Self Care/Home Management : 8-22 mins G-Codes:    Lovett,Amy  Amy T  Lovett, OTR/L, CLT  02/07/2015, 12:08 PM

## 2015-02-08 DIAGNOSIS — E440 Moderate protein-calorie malnutrition: Secondary | ICD-10-CM | POA: Insufficient documentation

## 2015-02-08 DIAGNOSIS — E46 Unspecified protein-calorie malnutrition: Secondary | ICD-10-CM | POA: Insufficient documentation

## 2015-02-08 LAB — CBC
HCT: 36.7 % — ABNORMAL LOW (ref 40.0–52.0)
Hemoglobin: 12.1 g/dL — ABNORMAL LOW (ref 13.0–18.0)
MCH: 29.3 pg (ref 26.0–34.0)
MCHC: 33 g/dL (ref 32.0–36.0)
MCV: 88.8 fL (ref 80.0–100.0)
PLATELETS: 111 10*3/uL — AB (ref 150–440)
RBC: 4.14 MIL/uL — ABNORMAL LOW (ref 4.40–5.90)
RDW: 14.7 % — AB (ref 11.5–14.5)
WBC: 6.4 10*3/uL (ref 3.8–10.6)

## 2015-02-08 MED ORDER — TRAMADOL HCL 50 MG PO TABS: 50.0000 mg | ORAL_TABLET | Freq: Four times a day (QID) | ORAL | Status: DC | PRN

## 2015-02-08 MED ORDER — ENOXAPARIN SODIUM 40 MG/0.4ML ~~LOC~~ SOLN: 40.0000 mg | SUBCUTANEOUS | Status: DC

## 2015-02-08 NOTE — Discharge Summary (Signed)
Physician Discharge Summary  Patient ID: Spencer Coleman MRN: [id number] DOB/AGE: 80-Jan-1932 79 y.o.  Admit date: 02/06/2015 Discharge date: 02/08/2015  Admission Diagnoses:  CLOSED DISPLACED TRANSVERSE FRACTURE   Discharge Diagnoses: Patient Active Problem List   Diagnosis Date Noted  . Malnutrition of moderate degree 02/08/2015  . Patellar fracture 01/09/2015  . Calculus of kidney 01/09/2015  . Congestive heart failure (HCC) 01/09/2015  . Patella fracture 01/09/2015  . Cardiomyopathy (HCC) 06/06/2013  . History of cardiac catheterization 06/06/2013  . BP (high blood pressure) 06/06/2013  . HLD (hyperlipidemia) 06/06/2013  . Ventricular tachycardia (HCC) 04/05/2013  . Automatic implantable cardioverter-defibrillator in situ 03/25/2013    Past Medical History  Diagnosis Date  . CHF (congestive heart failure) (HCC)     nonischemic cardiomyopathy  . CKD (chronic kidney disease)   . CAD (coronary artery disease)     Nonobstructive cardiac disease by cath  . Ventricular tachycardia (HCC)     s/p ICD  . Hypertension   . Hyperlipidemia      Transfusion: none   Consultants (if any):    Discharged Condition: Improved  Hospital Course: GRANGER CHUI is an 82 y.o. male who was admitted 02/06/2015 with a diagnosis of displaced left patella fracture and went to the operating room on 02/06/2015 and underwent the above named procedures.    Surgeries: Procedure(s): OPEN REDUCTION INTERNAL (ORIF) FIXATION PATELLA on 02/06/2015 Patient tolerated the surgery well. Taken to PACU where she was stabilized and then transferred to the orthopedic floor.  Started on Lovenox 30 q 12 hrs. Foot pumps applied bilaterally at 80 mm. Heels elevated on bed with rolled towels. No evidence of DVT. Negative Homan. Physical therapy started on day #1 for gait training and transfer. Knee immobilizer was applied and left in full extension at all times.  Patient's foley was d/c on day #1. Patient's IV was d/c on  day #2.  On post op day #3 patient was stable and ready for discharge to home with home health PT. He must continue to keep Left knee in extension at all times.   Implants: 2 - 4.0 mm cannulated partially threaded cancellous screws, 1.6 mm wire  He was given perioperative antibiotics:  Anti-infectives    Start     Dose/Rate Route Frequency Ordered Stop   02/06/15 1930  ceFAZolin (ANCEF) IVPB 2 g/50 mL premix     2 g 100 mL/hr over 30 Minutes Intravenous Every 6 hours 02/06/15 1923 02/07/15 1511   02/06/15 0800  ceFAZolin (ANCEF) IVPB 2 g/50 mL premix     2 g 100 mL/hr over 30 Minutes Intravenous  Once 02/06/15 0756 02/06/15 1534   02/06/15 0608  ceFAZolin (ANCEF) 2-3 GM-% IVPB SOLR    Comments:  Nicholes Rough: cabinet override      02/06/15 0608 02/06/15 1814    .  He was given sequential compression devices, early ambulation, and Lovenox for DVT prophylaxis.  He benefited maximally from the hospital stay and there were no complications.    Recent vital signs:  Filed Vitals:   02/08/15 0759 02/08/15 0838  BP: 119/54 130/61  Pulse: 59 63  Temp: 98.1 F (36.7 C)   Resp: 16     Recent laboratory studies:  Lab Results  Component Value Date   HGB 12.1* 02/08/2015   HGB 12.0* 02/07/2015   HGB 11.8* 01/13/2015   Lab Results  Component Value Date   WBC 6.4 02/08/2015   PLT 111* 02/08/2015   Lab Results  Component Value Date   INR 0.99 01/09/2015   Lab Results  Component Value Date   NA 142 02/07/2015   K 3.7 02/07/2015   CL 105 02/07/2015   CO2 33* 02/07/2015   BUN 18 02/07/2015   CREATININE 1.46* 02/07/2015   GLUCOSE 99 02/07/2015    Discharge Medications:     Medication List    ASK your doctor about these medications        amiodarone 400 MG tablet  Commonly known as:  PACERONE  Take 1 tablet by mouth at bedtime.     aspirin EC 325 MG tablet  Take 1 tablet (325 mg total) by mouth daily.     furosemide 20 MG tablet  Commonly known as:  LASIX  Take 1  tablet by mouth daily.     metoprolol 50 MG tablet  Commonly known as:  LOPRESSOR  Take 1 tablet by mouth 2 (two) times daily.     oxyCODONE 5 MG immediate release tablet  Commonly known as:  Oxy IR/ROXICODONE  Take 1 tablet (5 mg total) by mouth every 6 (six) hours as needed for moderate pain, severe pain or breakthrough pain.     simvastatin 40 MG tablet  Commonly known as:  ZOCOR  Take 1 tablet by mouth daily.        Diagnostic Studies: Dg Chest 2 View  01/12/2015  CLINICAL DATA:  Hypoxia. EXAM: CHEST  2 VIEW COMPARISON:  January 09, 2015. FINDINGS: The heart size and mediastinal contours are within normal limits. Both lungs are clear. Left-sided pacemaker is unchanged in position. No pneumothorax or pleural effusion is noted. Stable elevated right hemidiaphragm is noted. The visualized skeletal structures are unremarkable. IMPRESSION: No active cardiopulmonary disease. Electronically Signed   By: Lupita Raider, M.D.   On: 01/12/2015 16:02   Dg Knee 1-2 Views Left  02/06/2015  CLINICAL DATA:  Revision of left patellar fixation EXAM: LEFT KNEE - 1-2 VIEW; DG C-ARM 61-120 MIN COMPARISON:  01/09/2015 FLUOROSCOPY TIME:  Fluoroscopy Time:  31 seconds Number of Acquired Images:  2 FINDINGS: Fixation screws and wires are now seen traversing the patellar fracture. There is slight distraction of fracture fragments identified. IMPRESSION: Status post revision of ORIF of left patellar fracture Electronically Signed   By: Alcide Clever M.D.   On: 02/06/2015 17:33   Dg Knee 1-2 Views Left  01/10/2015  CLINICAL DATA:  Operative patellar fracture. EXAM: LEFT KNEE - 1-2 VIEW; DG C-ARM 61-120 MIN COMPARISON:  01/09/2015 FINDINGS: Two images are submitted, demonstrating interval reduction and fixation of patellar fracture with pins and cerclage wires. IMPRESSION: ORIF patellar fracture. Electronically Signed   By: Norva Pavlov M.D.   On: 01/10/2015 13:01   Dg Chest Port 1 View  01/09/2015   CLINICAL DATA:  82 year old male - preoperative respiratory examination prior to knee surgery. EXAM: PORTABLE CHEST 1 VIEW COMPARISON:  10/27/2012 chest radiograph FINDINGS: The cardiomediastinal silhouette is unremarkable. A left-sided pacemaker/ ICD again noted. Elevation of the right hemidiaphragm is unchanged. There is no evidence of focal airspace disease, pulmonary edema, suspicious pulmonary nodule/mass, pleural effusion, or pneumothorax. No acute bony abnormalities are identified. IMPRESSION: No active disease. Electronically Signed   By: Harmon Pier M.D.   On: 01/09/2015 19:05   Dg Knee Complete 4 Views Left  01/09/2015  CLINICAL DATA:  Fall on concrete. Left patellar pain and abrasion. Initial encounter. EXAM: LEFT KNEE - COMPLETE 4+ VIEW COMPARISON:  None. FINDINGS: Comminuted fracture is seen involving  the patella, with mild displacement of fracture fragments. Prominent prepatellar soft tissue swelling is seen as well as small lipohemarthrosis. No other fractures are identified. No evidence of dislocation. Peripheral vascular calcification noted. IMPRESSION: Comminuted patellar fracture. Associated small lipohemarthrosis and soft tissue swelling. Electronically Signed   By: Myles Rosenthal M.D.   On: 01/09/2015 17:39   Dg C-arm 1-60 Min  02/06/2015  CLINICAL DATA:  Revision of left patellar fixation EXAM: LEFT KNEE - 1-2 VIEW; DG C-ARM 61-120 MIN COMPARISON:  01/09/2015 FLUOROSCOPY TIME:  Fluoroscopy Time:  31 seconds Number of Acquired Images:  2 FINDINGS: Fixation screws and wires are now seen traversing the patellar fracture. There is slight distraction of fracture fragments identified. IMPRESSION: Status post revision of ORIF of left patellar fracture Electronically Signed   By: Alcide Clever M.D.   On: 02/06/2015 17:33   Dg C-arm 61-120 Min  01/10/2015  CLINICAL DATA:  Operative patellar fracture. EXAM: LEFT KNEE - 1-2 VIEW; DG C-ARM 61-120 MIN COMPARISON:  01/09/2015 FINDINGS: Two images are  submitted, demonstrating interval reduction and fixation of patellar fracture with pins and cerclage wires. IMPRESSION: ORIF patellar fracture. Electronically Signed   By: Norva Pavlov M.D.   On: 01/10/2015 13:01    Disposition: 03-Skilled Nursing Facility       Signed: Amador Cunas CHRISTOPHER 02/08/2015, 9:03 AM

## 2015-02-08 NOTE — Care Management Note (Signed)
Case Management Note  Patient Details  Name: Spencer Coleman MRN: [id number] Date of Birth: 01-Jul-1930  Subjective/Objective:     Discussed discharge planning with [name] again today.  Discussed home health providers and chose [facility] from the list of home health providers. This Clinical research associate called [facility] to verify insurance and the availability of physical therapists. Currently awaiting a call back.               Action/Plan:   Expected Discharge Date:                  Expected Discharge Plan:     In-House Referral:     Discharge planning Services     Post Acute Care Choice:    Choice offered to:     DME Arranged:    DME Agency:     HH Arranged:    HH Agency:     Status of Service:     Medicare Important Message Given:    Date Medicare IM Given:    Medicare IM give by:    Date Additional Medicare IM Given:    Additional Medicare Important Message give by:     If discussed at Long Length of Stay Meetings, dates discussed:    Additional Comments:  Helmuth Recupero A, RN 02/08/2015, 9:24 AM

## 2015-02-08 NOTE — Discharge Instructions (Signed)
°  Instructions after Knee Surgery    Aalivia Mcgraw P. Angie Fava., M.D.     Dept. of Orthopaedics & Sports Medicine  [facility]  [address]  [place], [place]  [address]   Phone: [phone]   Fax: [phone]   DIET:  Drink plenty of non-alcoholic fluids & begin a light diet.  Resume your normal diet.  ACTIVITY:   You may use a walker with weight-bearing as tolerated.  You must keep the knee brace in place AT ALL TIMES. The brace is to be locked in full extension (straight).  Begin doing gentle exercises. Exercising will reduce the pain and swelling, increase motion, and prevent muscle weakness.    Avoid strenuous activities.  Do not drive or operate any equipment until instructed.  WOUND CARE:   Place one to two pillows under the knee the first day or two when sitting or lying.   Continue to use the PolarCare cooling pad to reduce pain and swelling.  The incision in your knee is closed with staples. The staples will be removed in the office.  Do NOT use any ointments or creams on the incisions.   You may bathe or shower after the stitches are removed at the first office visit following surgery.  MEDICATIONS:  You may resume your regular medications.  Please take the pain medication as prescribed.  Do not take pain medication on an empty stomach.  Do not drive or drink alcoholic beverages when taking pain medications.  CALL THE OFFICE FOR:  Temperature above 101 degrees  Excessive bleeding or drainage on the dressing.  Excessive swelling, coldness, or paleness of the toes.  Persistent nausea and vomiting.  FOLLOW-UP:   Home physical therapy has been ordered for you.  You should have an appointment to return to the office in 10-14 days after surgery.

## 2015-02-08 NOTE — Progress Notes (Signed)
Physical Therapy Treatment Patient Details Name: Spencer Coleman MRN: [id number] DOB: [birth year] Today's Date: 02/08/2015    History of Present Illness Spencer Coleman is a 82 y.o. year old male who fell and sustained a fracture of the left patella approximately 4 weeks ago. He underwent open reduction and internal fixation of the comminuted left patella fracture and did well initially. He was transferred to the skilled nursing facility at [facility]. Apparently the knee brace was removed and their physical therapy staff started aggressive range of motion exercises. Upon follow-up in the offiice he was noted to have displacement of the previously reduced fracture fragments with backing out of the K wires.Marland KitchenHe was admitted to [facility] for revision of surgery for left patella.  His plan is to go home after hospital stay this time wtih help from his son.      PT Comments    Pt was able to demonstrate a control of walking that is improved over yesterday and did report he is in a level home environment that is conducive to family assisting him.  Follow Up Recommendations  Home health PT     Equipment Recommendations  Rolling walker with 5" wheels;3in1 (PT)    Recommendations for Other Services       Precautions / Restrictions Precautions Precautions: Fall Required Braces or Orthoses: Knee Immobilizer - Left Knee Immobilizer - Left: On at all times Restrictions Weight Bearing Restrictions: Yes LLE Weight Bearing: Weight bearing as tolerated    Mobility  Bed Mobility Overal bed mobility: Needs Assistance                Transfers Overall transfer level: Needs assistance Equipment used: Rolling walker (2 wheeled) Transfers: Sit to/from [employer] Sit to Stand: Min guard Stand pivot transfers: Min guard       General transfer comment: cued hand placement  Ambulation/Gait Ambulation/Gait assistance: Min guard Ambulation Distance (Feet): 300  Feet Assistive device: Rolling walker (2 wheeled) Gait Pattern/deviations: Step-through pattern;Decreased stance time - left;Decreased step length - right;Wide base of support Gait velocity: reduced very slightly Gait velocity interpretation: Below normal speed for age/gender General Gait Details: needs a clear pathway to manage walker in his room but can clear obstacles.   Stairs            Wheelchair Mobility    Modified Rankin (Stroke Patients Only)       Balance Overall balance assessment: Needs assistance Sitting-balance support: Feet supported Sitting balance-Leahy Scale: Good   Postural control: Posterior lean Standing balance support: Bilateral upper extremity supported Standing balance-Leahy Scale: Fair                      Cognition Arousal/Alertness: Awake/alert Behavior During Therapy: WFL for tasks assessed/performed Overall Cognitive Status: Within Functional Limits for tasks assessed                      Exercises General Exercises - Lower Extremity Ankle Circles/Pumps: Both;10 reps Quad Sets: Both;10 reps Hip ABduction/ADduction: AROM;Both;10 reps    General Comments        Pertinent Vitals/Pain Pain Assessment: 0-10 Pain Score: 2  Pain Location: L knee Pain Descriptors / Indicators: Aching Pain Intervention(s): Monitored during session;Premedicated before session;Ice applied    Home Living                      Prior Function  PT Goals (current goals can now be found in the care plan section) Acute Rehab PT Goals Patient Stated Goal: to get home and walk more Progress towards PT goals: Progressing toward goals    Frequency  7X/week    PT Plan Current plan remains appropriate    Co-evaluation             End of Session Equipment Utilized During Treatment: Gait belt Activity Tolerance: Patient tolerated treatment well Patient left: in chair;with call bell/phone within reach;with chair  alarm set     Time: 1610-9604 PT Time Calculation (min) (ACUTE ONLY): 30 min  Charges:  $Gait Training: 8-22 mins $Therapeutic Exercise: 8-22 mins                    G Codes:      Ivar Drape 2015/03/08, 12:51 PM   Samul Dada, PT MS Acute Rehab Dept. Number: [phone] [id number] and [phone]

## 2015-02-08 NOTE — Care Management Note (Signed)
Case Management Note  Patient Details  Name: Spencer Coleman MRN: [id number] Date of Birth: 11-12-1930  Subjective/Objective:          Dr Loreta Ave chose [facility] to be his home health provider. A referral was called to Shea Stakes at [place] who accepted the referral and [name]. [name] is no longer residing in [place] and is now residing at [address] [place], [place] [address]. Temporary phone is [phone].           Action/Plan:   Expected Discharge Date:                  Expected Discharge Plan:     In-House Referral:     Discharge planning Services     Post Acute Care Choice:    Choice offered to:     DME Arranged:    DME Agency:     HH Arranged:    HH Agency:     Status of Service:     Medicare Important Message Given:    Date Medicare IM Given:    Medicare IM give by:    Date Additional Medicare IM Given:    Additional Medicare Important Message give by:     If discussed at Long Length of Stay Meetings, dates discussed:    Additional Comments:  [name], RN 02/08/2015, 11:19 AM

## 2015-02-08 NOTE — Progress Notes (Signed)
ORTHOPAEDICS PROGRESS NOTE  PATIENT NAME: Spencer Coleman DOB: Jan 08, 1931  MRN: [id number]  POD # 1: ORIF of a comminuted left patella fracture  Subjective: Pain has been well controlled. He has participated well with physical therapy. Patient had a bowel movement yesterday.  Objective: Vital signs in last 24 hours: Temp:  [97.7 F (36.5 C)-98.1 F (36.7 C)] 98.1 F (36.7 C) (01/29 0759) Pulse Rate:  [58-67] 59 (01/29 0759) Resp:  [16-20] 16 (01/29 0759) BP: (99-139)/(54-68) 119/54 mmHg (01/29 0759) SpO2:  [92 %-98 %] 92 % (01/29 0759)  Intake/Output from previous day: 01/28 0701 - 01/29 0700 In: 530 [P.O.:480; IV Piggyback:50] Out: -    Recent Labs  02/07/15 0401 02/08/15 0345  WBC 5.4 6.4  HGB 12.0* 12.1*  HCT 36.8* 36.7*  PLT 114* 111*  K 3.7  --   CL 105  --   CO2 33*  --   BUN 18  --   CREATININE 1.46*  --   GLUCOSE 99  --   CALCIUM 8.2*  --     EXAM General: Well-developed well-nourished elderly gentleman seen in no acute distress. Lungs: clear to auscultation Cardiac: normal rate and regular rhythm Left lower extremity: Dressing was changed today. Surgical incision is well approximated with staples. Minimal bloody drainage on the dressing. No erythema. Minimal swelling is appreciated. No knee effusion. The patient has difficulty performing an independent straight leg raise. Neurologic: Awake, alert, and oriented. Sensory function is grossly intact. Motor strength is felt to be 5 over 5 the exception of the left lower extremity as noted above.  Assessment: Status post ORIF of a left comminuted talus fracture  Active Problems:   Patellar fracture   Malnutrition of moderate degree  Plan: Note from dietary was reviewed. I appreciate their recommendations and have encouraged the patient to work on his caloric intake. Notes from physical therapy were also reviewed. The patient ambulated only 8 steps yesterday. He will need to demonstrate safety with longer  ambulation as well as with stair ambulation before discharge to home. I anticipate the need for home physical therapy. Continue use of Tylenol whenever possible for pain management due to his issues with mild delirium after his previous surgery. Plan is to go Home after hospital stay. DVT Prophylaxis - Lovenox, Foot Pumps and TED hose  James P. Angie Fava M.D.

## 2015-02-08 NOTE — Progress Notes (Addendum)
DISCHARGE NOTE:  Pt given discharge instructions and prescriptions. (Paper prescriptions for tramadol) given to pt. Pt will make follow up appt with MD Hooten. . Pt verbalized understanding. Pt wheeled to car by staff.

## 2015-02-09 ENCOUNTER — Encounter: Payer: Self-pay | Admitting: Orthopedic Surgery

## 2016-06-04 ENCOUNTER — Encounter: Payer: Self-pay | Admitting: Emergency Medicine

## 2016-06-04 ENCOUNTER — Emergency Department: Payer: Medicare HMO

## 2016-06-04 ENCOUNTER — Emergency Department
Admission: EM | Admit: 2016-06-04 | Discharge: 2016-06-04 | Disposition: A | Discharge status: Home / Self Care | Payer: Medicare HMO | Attending: Emergency Medicine | Admitting: Emergency Medicine

## 2016-06-04 DIAGNOSIS — Z79899 Other long term (current) drug therapy: Secondary | ICD-10-CM | POA: Diagnosis not present

## 2016-06-04 DIAGNOSIS — W01198A Fall on same level from slipping, tripping and stumbling with subsequent striking against other object, initial encounter: Secondary | ICD-10-CM | POA: Diagnosis not present

## 2016-06-04 DIAGNOSIS — S59902A Unspecified injury of left elbow, initial encounter: Secondary | ICD-10-CM | POA: Diagnosis present

## 2016-06-04 DIAGNOSIS — I509 Heart failure, unspecified: Secondary | ICD-10-CM | POA: Insufficient documentation

## 2016-06-04 DIAGNOSIS — I130 Hypertensive heart and chronic kidney disease with heart failure and stage 1 through stage 4 chronic kidney disease, or unspecified chronic kidney disease: Secondary | ICD-10-CM | POA: Diagnosis not present

## 2016-06-04 DIAGNOSIS — I110 Hypertensive heart disease with heart failure: Secondary | ICD-10-CM | POA: Diagnosis not present

## 2016-06-04 DIAGNOSIS — I2510 Atherosclerotic heart disease of native coronary artery without angina pectoris: Secondary | ICD-10-CM | POA: Insufficient documentation

## 2016-06-04 DIAGNOSIS — Y939 Activity, unspecified: Secondary | ICD-10-CM | POA: Insufficient documentation

## 2016-06-04 DIAGNOSIS — S42402A Unspecified fracture of lower end of left humerus, initial encounter for closed fracture: Secondary | ICD-10-CM | POA: Diagnosis not present

## 2016-06-04 DIAGNOSIS — Y999 Unspecified external cause status: Secondary | ICD-10-CM | POA: Insufficient documentation

## 2016-06-04 DIAGNOSIS — N189 Chronic kidney disease, unspecified: Secondary | ICD-10-CM | POA: Diagnosis not present

## 2016-06-04 DIAGNOSIS — R51 Headache: Secondary | ICD-10-CM | POA: Insufficient documentation

## 2016-06-04 DIAGNOSIS — W19XXXA Unspecified fall, initial encounter: Secondary | ICD-10-CM

## 2016-06-04 DIAGNOSIS — Y92019 Unspecified place in single-family (private) house as the place of occurrence of the external cause: Secondary | ICD-10-CM | POA: Diagnosis not present

## 2016-06-04 LAB — CBC
HCT: 41 % (ref 40.0–52.0)
Hemoglobin: 13.8 g/dL (ref 13.0–18.0)
MCH: 29.4 pg (ref 26.0–34.0)
MCHC: 33.7 g/dL (ref 32.0–36.0)
MCV: 87.1 fL (ref 80.0–100.0)
PLATELETS: 163 10*3/uL (ref 150–440)
RBC: 4.71 MIL/uL (ref 4.40–5.90)
RDW: 14.4 % (ref 11.5–14.5)
WBC: 8.3 10*3/uL (ref 3.8–10.6)

## 2016-06-04 LAB — TROPONIN I

## 2016-06-04 LAB — BASIC METABOLIC PANEL
Anion gap: 6 (ref 5–15)
BUN: 24 mg/dL — AB (ref 6–20)
CALCIUM: 9.1 mg/dL (ref 8.9–10.3)
CHLORIDE: 107 mmol/L (ref 101–111)
CO2: 28 mmol/L (ref 22–32)
CREATININE: 1.56 mg/dL — AB (ref 0.61–1.24)
GFR calc Af Amer: 46 mL/min — ABNORMAL LOW (ref >60)
GFR calc non Af Amer: 39 mL/min — ABNORMAL LOW (ref >60)
Glucose, Bld: 114 mg/dL — ABNORMAL HIGH (ref 65–99)
Potassium: 4.8 mmol/L (ref 3.5–5.1)
SODIUM: 141 mmol/L (ref 135–145)

## 2016-06-04 MED ORDER — TRAMADOL HCL 50 MG PO TABS
50.0000 mg | ORAL_TABLET | ORAL | Status: AC
  Administered 2016-06-04: 50 mg via ORAL
  Filled 2016-06-04: qty 1

## 2016-06-04 MED ORDER — TRAMADOL HCL 50 MG PO TABS: 50.0000 mg | ORAL_TABLET | Freq: Four times a day (QID) | ORAL | 0 refills | Status: DC | PRN

## 2016-06-04 MED ORDER — ONDANSETRON HCL 4 MG/2ML IJ SOLN
4.0000 mg | Freq: Once | INTRAMUSCULAR | Status: AC
  Administered 2016-06-04: 4 mg via INTRAVENOUS
  Filled 2016-06-04: qty 2

## 2016-06-04 MED ORDER — MORPHINE SULFATE (PF) 4 MG/ML IV SOLN
4.0000 mg | Freq: Once | INTRAVENOUS | Status: AC
  Administered 2016-06-04: 4 mg via INTRAVENOUS
  Filled 2016-06-04: qty 1

## 2016-06-04 NOTE — ED Triage Notes (Addendum)
Pt states he tripped walking around his house on the carpet about 1 hour prior to arrival. Pt drove himself here. Pt states he hit the left forehead on a shelf above the fireplace and fell on to left elbow.  Pt denies any head pain at this time. Swelling noted to left elbow and is painful to move. Decrease ROM to left elbow, radial pulses intact.  Denies LOC.

## 2016-06-04 NOTE — ED Notes (Signed)
ED tech x 2 at bedside to splint left arm/elbow

## 2016-06-04 NOTE — ED Notes (Signed)
Pt transfered to CT 

## 2016-06-04 NOTE — ED Notes (Signed)
Pt states he takes a full strength aspirin daily. Bruising noted to bilateral forearms

## 2016-06-04 NOTE — Discharge Instructions (Signed)
You have been seen in the Emergency Department (ED) today for a fall.    Please follow up with Dr. Hyacinth MeekerMiller of orthopedics regarding today's Emergency Department (ED) visit and your recent fall. Set up a follow-up appointment for Tuesday of this coming week.    Return to the ED if you have any headache, confusion, slurred speech, weakness/numbness of any arm or leg, or any increased pain. In addition, if you notice that your hand is turning blue, develop a fever or concerns for infection, significant pain that your elbow and the hand, numbness or inability to move your fingers or weakness in the hand please return the emergency room right away.  No driving today or within 8 hours of using tramadol.

## 2016-06-04 NOTE — ED Provider Notes (Signed)
[facility] Emergency Department Provider Note ____________________________________________   First MD Initiated Contact with Patient 06/04/16 1726     (approximate)  I have reviewed the triage vital signs and the nursing notes.   HISTORY  Chief Complaint Fall    HPI Spencer Coleman is a 83 y.o. male previous history of heart failure, cardiac myopathy and an ICD  Patient presents today, reports that he was walking when that his house when he tripped on the carpet. He fell directly on his left ankle and brushed the side of his forehead on a chair. He reports he had immediate pain and swelling in the left elbow. No nausea vomiting. No fevers or chills. While sitting in the waiting room he did start to feel nauseated and reports his pain was severe.  He presently reports severe pain when he tries to move at the left elbow. No other injury. No numbness tingling or weakness in the left hand. Denies any other injury. No neck pain.  Takes aspirin but no other blood thinners   Past Medical History:  Diagnosis Date  . CAD (coronary artery disease)    Nonobstructive cardiac disease by cath  . CHF (congestive heart failure) (HCC)    nonischemic cardiomyopathy  . CKD (chronic kidney disease)   . Hyperlipidemia   . Hypertension   . Ventricular tachycardia [facility])    s/p ICD    Patient Active Problem List   Diagnosis Date Noted  . Malnutrition of moderate degree 02/08/2015  . Patellar fracture 01/09/2015  . Calculus of kidney 01/09/2015  . Congestive heart failure (HCC) 01/09/2015  . Patella fracture 01/09/2015  . Cardiomyopathy (HCC) 06/06/2013  . History of cardiac catheterization 06/06/2013  . BP (high blood pressure) 06/06/2013  . HLD (hyperlipidemia) 06/06/2013  . Ventricular tachycardia (HCC) 04/05/2013  . Automatic implantable cardioverter-defibrillator in situ 03/25/2013    Past Surgical History:  Procedure Laterality Date  . CARDIAC  CATHETERIZATION    . CARDIAC DEFIBRILLATOR PLACEMENT    . INGUINAL HERNIA REPAIR    . LITHOTRIPSY    . ORIF left wrist fracture    . ORIF PATELLA Left 01/10/2015   Procedure: OPEN REDUCTION INTERNAL (ORIF) FIXATION PATELLA;  Surgeon: Donato Heinz, MD;  Location: [facility] ORS;  Service: Orthopedics;  Laterality: Left;  . ORIF PATELLA Left 02/06/2015   Procedure: OPEN REDUCTION INTERNAL (ORIF) FIXATION PATELLA;  Surgeon: Donato Heinz, MD;  Location: [facility] ORS;  Service: Orthopedics;  Laterality: Left;  . PACEMAKER INSERTION      Prior to Admission medications   Medication Sig Start Date End Date Taking? Authorizing Provider  amiodarone (PACERONE) 400 MG tablet Take 1 tablet by mouth at bedtime.  10/27/14  Yes [provider]  aspirin 325 MG tablet Take 325 mg by mouth daily.   Yes [provider]  furosemide (LASIX) 20 MG tablet Take 1 tablet by mouth daily. 10/27/14  Yes [provider]  metoprolol (LOPRESSOR) 50 MG tablet Take 1 tablet by mouth 2 (two) times daily. 10/27/14  Yes [provider]  simvastatin (ZOCOR) 40 MG tablet Take 1 tablet by mouth daily at 6 PM.  10/27/14  Yes [provider]  traMADol (ULTRAM) 50 MG tablet Take 1 tablet (50 mg total) by mouth every 6 (six) hours as needed for severe pain. 06/04/16   Sharyn Creamer, MD    Allergies Patient has no known allergies.  Family History  Problem Relation Age of Onset  . CAD Mother   .  Kidney cancer Father     Social History Social History  Substance Use Topics  . Smoking status: Never Smoker  . Smokeless tobacco: Never Used  . Alcohol use No    Review of Systems Constitutional: No fever/chills Eyes: No visual changes. ENT: No sore throat. Cardiovascular: Denies chest pain. No rib pain. Denies pain in the shoulder forearm or hand on the left. Respiratory: Denies shortness of breath. Gastrointestinal: No abdominal pain.   Genitourinary: Negative for  dysuria. Musculoskeletal: Negative for back pain. Skin: Negative for rash. Neurological: Negative for headaches, focal weakness or numbness.  10-point ROS otherwise negative.  ____________________________________________   PHYSICAL EXAM:  VITAL SIGNS: ED Triage Vitals  Enc Vitals Group     BP 06/04/16 1730 129/75     Pulse Rate 06/04/16 1800 60     Resp 06/04/16 1730 14     Temp --      Temp src --      SpO2 06/04/16 1800 97 %     Weight 06/04/16 1642 215 lb (97.5 kg)     Height 06/04/16 1642 5' 10.5" (1.791 m)     Head Circumference --      Peak Flow --      Pain Score 06/04/16 1642 5     Pain Loc --      Pain Edu? --      Excl. in GC? --     Constitutional: Alert and oriented. Well appearing and in no acute distressThough he definitely looks like he has pain whenever he attempts to move the left elbow. Eyes: Conjunctivae are normal. PERRL. EOMI. Head: Atraumatic. Nose: No congestion/rhinnorhea. Mouth/Throat: Mucous membranes are moist.  Oropharynx non-erythematous. Neck: No stridor.  No cervical or thoracic tenderness Cardiovascular: Normal rate, regular rhythm. Grossly normal heart sounds.  Good peripheral circulation. Pacemaker in the left upper chest. No tenderness along the ribs or bruising along the chest wall. Respiratory: Normal respiratory effort.  No retractions. Lungs CTAB. Gastrointestinal: Soft and nontender. No distention. No abdominal bruits. No CVA tenderness. Musculoskeletal:   RIGHT Right upper extremity demonstrates normal strength, good use of all muscles. No edema bruising or contusions of the right shoulder/upper arm, right forearm / hand. Full range of motion of the right right upper extremity except at the ankle with church No evidence of trauma. Strong radial pulse. Intact median/ulnar/radial neuro-muscular exam.  LEFT Left upper extremity demonstrates normal strength, good use of all muscles. No edema bruising or contusions of the left  shoulder/upper arm, left elbow, left forearm / hand. Full range of motion of the left  upper extremity without pain. No evidence of trauma. Strong radial pulse. Intact median/ulnar/radial neuro-muscular exam.    Lower Extremities    Normal neuro-motor function lower extremities bilateral.  RIGHT Right lower extremity demonstrates normal strength, good use of all muscles. No edema bruising or contusions of the right hip, right knee, right ankle. Full range of motion of the right lower extremity without pain. No pain on axial loading. No evidence of trauma.  LEFT Left lower extremity demonstrates normal strength, good use of all muscles. No edema bruising or contusions of the hip,  knee, ankle. Full range of motion of the left lower extremity without pain. No pain on axial loading. No evidence of trauma.   Neurologic:  Normal speech and language. No gross focal neurologic deficits are appreciated. No gait instability. Skin:  Skin is warm, dry and intact. No rash noted. Psychiatric: Mood and affect are normal. Speech and  behavior are normal.  ____________________________________________   LABS (all labs ordered are listed, but only abnormal results are displayed)  Labs Reviewed  BASIC METABOLIC PANEL - Abnormal; Notable for the following:       Result Value   Glucose, Bld 114 (*)    BUN 24 (*)    Creatinine, Ser 1.56 (*)    GFR calc non Af Amer 39 (*)    GFR calc Af Amer 46 (*)    All other components within normal limits  CBC  TROPONIN I  URINALYSIS, COMPLETE (UACMP) WITH MICROSCOPIC  CBG MONITORING, ED   ____________________________________________  EKG   Reviewed and interpreted by me at 1720 Heart rate 60 QRS 150 QTc 480 Dual-chamber pacemaker, atrial pacing no evidence of acute ischemia noted. ____________________________________________  RADIOLOGY  Dg Elbow Complete Left  Result Date: 06/04/2016 CLINICAL DATA:  Fall today with elbow pain and swelling, initial  encounter EXAM: LEFT ELBOW - COMPLETE 3+ VIEW COMPARISON:  None. FINDINGS: There is a mildly displaced fracture of the olecranon process noted. Considerable soft tissue swelling consistent with hematoma is noted. There are also changes consistent with a comminuted radial head fracture with some impaction at the fracture site. There also changes suggestive of coronoid process fracture. IMPRESSION: Multiple fractures about the elbow joint. Considerable soft tissue swelling consistent with focal hematoma is noted posteriorly. Electronically Signed   By: Alcide Clever M.D.   On: 06/04/2016 17:42   Ct Head Wo Contrast  Result Date: 06/04/2016 CLINICAL DATA:  Fall.  Hit head.  Mild right frontal headache. EXAM: CT HEAD WITHOUT CONTRAST TECHNIQUE: Contiguous axial images were obtained from the base of the skull through the vertex without intravenous contrast. COMPARISON:  None. FINDINGS: Brain: No evidence of acute infarction, hemorrhage, hydrocephalus, extra-axial collection or mass lesion/mass effect. There is prominence of the sulci and ventricles compatible with brain atrophy. Mild low attenuation within the subcortical and the periventricular white matter is noted compatible with chronic microvascular disease. Vascular: No hyperdense vessel or unexpected calcification. Skull: Normal. Negative for fracture or focal lesion. Sinuses/Orbits: The mastoid air cells appear clear. Fluid level is identified within the sphenoid sinus. Other: None. IMPRESSION: 1. No acute intracranial abnormalities. 2. Small vessel ischemic change and brain atrophy 3. Sphenoid sinus disease. Electronically Signed   By: Signa Kell M.D.   On: 06/04/2016 18:23    ____________________________________________   PROCEDURES  Procedure(s) performed: None  Procedures  Critical Care performed: No  ____________________________________________   INITIAL IMPRESSION / ASSESSMENT AND PLAN / ED COURSE  Pertinent labs & imaging results  that were available during my care of the patient were reviewed by me and considered in my medical decision making (see chart for details).  Patient presents after a fall. Reports he tripped and fell directly on his elbow. No evidence of significant head injury or neck injury. Denies any injury and no signs of injury to the torso. Left elbow with moderate hematoma, pain and is intact distally with regard to neuromotor and vascular.  ----------------------------------------- 6:41 PM on 06/04/2016 -----------------------------------------  Patient resting comfortably. Reports he feels much better. Discussed and reviewed his case and x-rays with Dr. Hyacinth Meeker of orthopedics who recommends placing the patient in a sugar tong and treating conservatively to follow-up with him on Tuesday or Wednesday at the orthopedics clinic.  Patient placed into a sugar tong splint. Normal capillary refill, able to wiggle the fingers well on the left hand. Denies numbness. Pain well controlled at this time. Reports he  has taken tramadol for previous surgery in January without side effect. His brothers at the bedside, they will be taking him home and following up closely with orthopedics. Careful return precautions advised. Patient awake alert no distress with normal hemodynamics.      ____________________________________________   FINAL CLINICAL IMPRESSION(S) / ED DIAGNOSES  Final diagnoses:  Fall, initial encounter  Left elbow fracture, closed, initial encounter      NEW MEDICATIONS STARTED DURING THIS VISIT:  New Prescriptions   TRAMADOL (ULTRAM) 50 MG TABLET    Take 1 tablet (50 mg total) by mouth every 6 (six) hours as needed for severe pain.     Note:  This document was prepared using Dragon voice recognition software and may include unintentional dictation errors.     Sharyn Creamer, MD 06/04/16 2006

## 2016-06-04 NOTE — ED Notes (Signed)
ED Provider at bedside. 

## 2016-06-04 NOTE — ED Notes (Addendum)
Pt placed on 2L O2 due to morphine dropping O2 down to 88% when sleeping

## 2016-06-06 ENCOUNTER — Emergency Department
Admission: EM | Admit: 2016-06-06 | Discharge: 2016-06-06 | Disposition: A | Discharge status: Home / Self Care | Payer: Medicare HMO | Attending: Emergency Medicine | Admitting: Emergency Medicine

## 2016-06-06 ENCOUNTER — Encounter: Payer: Self-pay | Admitting: Emergency Medicine

## 2016-06-06 ENCOUNTER — Emergency Department: Payer: Medicare HMO

## 2016-06-06 DIAGNOSIS — I130 Hypertensive heart and chronic kidney disease with heart failure and stage 1 through stage 4 chronic kidney disease, or unspecified chronic kidney disease: Secondary | ICD-10-CM | POA: Insufficient documentation

## 2016-06-06 DIAGNOSIS — N189 Chronic kidney disease, unspecified: Secondary | ICD-10-CM | POA: Diagnosis not present

## 2016-06-06 DIAGNOSIS — I509 Heart failure, unspecified: Secondary | ICD-10-CM | POA: Diagnosis not present

## 2016-06-06 DIAGNOSIS — Z7982 Long term (current) use of aspirin: Secondary | ICD-10-CM | POA: Diagnosis not present

## 2016-06-06 DIAGNOSIS — S42402D Unspecified fracture of lower end of left humerus, subsequent encounter for fracture with routine healing: Secondary | ICD-10-CM | POA: Diagnosis not present

## 2016-06-06 DIAGNOSIS — X58XXXD Exposure to other specified factors, subsequent encounter: Secondary | ICD-10-CM | POA: Diagnosis not present

## 2016-06-06 DIAGNOSIS — I2510 Atherosclerotic heart disease of native coronary artery without angina pectoris: Secondary | ICD-10-CM | POA: Diagnosis not present

## 2016-06-06 DIAGNOSIS — S59902D Unspecified injury of left elbow, subsequent encounter: Secondary | ICD-10-CM | POA: Diagnosis present

## 2016-06-06 NOTE — ED Provider Notes (Signed)
[facility] Emergency Department Provider Note  ____________________________________________  Time seen: Approximately 9:07 PM  I have reviewed the triage vital signs and the nursing notes.   HISTORY  Chief Complaint Arm Problem    HPI Spencer Coleman is a 83 y.o. male that presents to emergency department with left arm bruising and swelling after elbow fracture 2 days ago. He denies any pain. He can move his fingers normally. No sensation changes. Patient states that he has kept his arm in the splint but has not been elevating his arm. No new injuries. He is supposed to take an aspirin every day but does not. He did not take an aspirin today. He denies fever, shortness breath, chest pain, nausea, vomiting, abdominal pain, numbness, tingling.   Past Medical History:  Diagnosis Date  . CAD (coronary artery disease)    Nonobstructive cardiac disease by cath  . CHF (congestive heart failure) (HCC)    nonischemic cardiomyopathy  . CKD (chronic kidney disease)   . Hyperlipidemia   . Hypertension   . Ventricular tachycardia [facility](HCC)    s/p ICD    Patient Active Problem List   Diagnosis Date Noted  . Malnutrition of moderate degree 02/08/2015  . Patellar fracture 01/09/2015  . Calculus of kidney 01/09/2015  . Congestive heart failure (HCC) 01/09/2015  . Patella fracture 01/09/2015  . Cardiomyopathy (HCC) 06/06/2013  . History of cardiac catheterization 06/06/2013  . BP (high blood pressure) 06/06/2013  . HLD (hyperlipidemia) 06/06/2013  . Ventricular tachycardia (HCC) 04/05/2013  . Automatic implantable cardioverter-defibrillator in situ 03/25/2013    Past Surgical History:  Procedure Laterality Date  . CARDIAC CATHETERIZATION    . CARDIAC DEFIBRILLATOR PLACEMENT    . INGUINAL HERNIA REPAIR    . LITHOTRIPSY    . ORIF left wrist fracture    . ORIF PATELLA Left 01/10/2015   Procedure: OPEN REDUCTION INTERNAL (ORIF) FIXATION PATELLA;  Surgeon: Donato HeinzJames P  Hooten, MD;  Location: [facility] ORS;  Service: Orthopedics;  Laterality: Left;  . ORIF PATELLA Left 02/06/2015   Procedure: OPEN REDUCTION INTERNAL (ORIF) FIXATION PATELLA;  Surgeon: Donato HeinzJames P Hooten, MD;  Location: [facility] ORS;  Service: Orthopedics;  Laterality: Left;  . PACEMAKER INSERTION      Prior to Admission medications   Medication Sig Start Date End Date Taking? Authorizing Provider  amiodarone (PACERONE) 400 MG tablet Take 1 tablet by mouth at bedtime.  10/27/14   [provider]  aspirin 325 MG tablet Take 325 mg by mouth daily.    [provider]  furosemide (LASIX) 20 MG tablet Take 1 tablet by mouth daily. 10/27/14   [provider]  metoprolol (LOPRESSOR) 50 MG tablet Take 1 tablet by mouth 2 (two) times daily. 10/27/14   [provider]  simvastatin (ZOCOR) 40 MG tablet Take 1 tablet by mouth daily at 6 PM.  10/27/14   [provider]  traMADol (ULTRAM) 50 MG tablet Take 1 tablet (50 mg total) by mouth every 6 (six) hours as needed for severe pain. 06/04/16   Sharyn CreamerQuale, Mark, MD    Allergies Patient has no known allergies.  Family History  Problem Relation Age of Onset  . CAD Mother   . Kidney cancer Father     Social History Social History  Substance Use Topics  . Smoking status: Never Smoker  . Smokeless tobacco: Never Used  . Alcohol use No     Review of Systems  Constitutional: No fever/chills Cardiovascular: No chest pain. Respiratory:  No SOB. Gastrointestinal: No abdominal pain.  No nausea, no vomiting.  Musculoskeletal: Negative for musculoskeletal pain. Skin: Negative for abrasions, lacerations. Positive for ecchymosis. Neurological: Negative for headaches, numbness or tingling   ____________________________________________   PHYSICAL EXAM:  VITAL SIGNS: ED Triage Vitals  Enc Vitals Group     BP 06/06/16 2023 (!) 156/72     Pulse Rate 06/06/16 2023 82     Resp 06/06/16 2023 18     Temp 06/06/16 2023 98 F  (36.7 C)     Temp Source 06/06/16 2023 Oral     SpO2 06/06/16 2023 97 %     Weight 06/06/16 2023 215 lb (97.5 kg)     Height 06/06/16 2023 5\' 10"  (1.778 m)     Head Circumference --      Peak Flow --      Pain Score 06/06/16 2024 3     Pain Loc --      Pain Edu? --      Excl. in GC? --      Constitutional: Alert and oriented. Well appearing and in no acute distress. Eyes: Conjunctivae are normal. PERRL. EOMI. Head: Atraumatic. ENT:      Ears:      Nose: No congestion/rhinnorhea.      Mouth/Throat: Mucous membranes are moist.  Neck: No stridor.  Cardiovascular: Normal rate, regular rhythm.  Good peripheral circulation. 2+ radial pulses. Respiratory: Normal respiratory effort without tachypnea or retractions. Lungs CTAB. Good air entry to the bases with no decreased or absent breath sounds. Musculoskeletal: Full range of motion to all extremities. No gross deformities appreciated. Neurologic:  Normal speech and language. No gross focal neurologic deficits are appreciated.  Skin:  Skin is warm, dry and intact. Ecchymosis extending from left elbow to left wrist. No ecchymosis on palmar side of left forearm. Moderate swelling to left hand. Tissues and compartments are soft.   ____________________________________________   LABS (all labs ordered are listed, but only abnormal results are displayed)  Labs Reviewed - No data to display ____________________________________________  EKG   ____________________________________________  RADIOLOGY  US Venous Img Upper Uni Left  Result Date: 06/06/2016 CLINICAL DATA:  Acute onset of left arm swelling and bruising, after recent fall. Initial encounter. EXAM: LEFT UPPER EXTREMITY VENOUS DOPPLER ULTRASOUND TECHNIQUE: Gray-scale sonography with graded compression, as well as color Doppler and duplex ultrasound were performed to evaluate the upper extremity deep venous system from the level of the subclavian vein and including the jugular,  axillary, basilic, radial, ulnar and upper cephalic vein. Spectral Doppler was utilized to evaluate flow at rest and with distal augmentation maneuvers. COMPARISON:  None. FINDINGS: Contralateral Subclavian Vein: Respiratory phasicity is normal and symmetric with the symptomatic side. No evidence of thrombus. Normal compressibility. Internal Jugular Vein: No evidence of thrombus. Normal compressibility, respiratory phasicity and response to augmentation. Subclavian Vein: No evidence of thrombus. Normal compressibility, respiratory phasicity and response to augmentation. Axillary Vein: No evidence of thrombus. Normal compressibility, respiratory phasicity and response to augmentation. Cephalic Vein: No evidence of thrombus. Normal compressibility, respiratory phasicity and response to augmentation. Basilic Vein: No evidence of thrombus. Normal compressibility, respiratory phasicity and response to augmentation. Brachial Veins: No evidence of thrombus. Normal compressibility, respiratory phasicity and response to augmentation. Radial Veins: No evidence of thrombus. Normal compressibility, respiratory phasicity and response to augmentation. Ulnar Veins: No evidence of thrombus. Normal compressibility, respiratory phasicity and response to augmentation. Venous Reflux:  None visualized. Other Findings: Diffuse soft tissue edema noted about the  left forearm and left hand, at the region of bruising. IMPRESSION: 1. No evidence of DVT within the left upper extremity. 2. Diffuse soft tissue edema about the left forearm and left hand, at the region of bruising. Electronically Signed   By: Roanna Raider M.D.   On: 06/06/2016 22:39    ____________________________________________    PROCEDURES  Procedure(s) performed:    Procedures    Medications - No data to display   ____________________________________________   INITIAL IMPRESSION / ASSESSMENT AND PLAN / ED COURSE  Pertinent labs & imaging results that  were available during my care of the patient were reviewed by me and considered in my medical decision making (see chart for details).  Review of the [place] CSRS was performed in accordance of the [facility] prior to dispensing any controlled drugs.   Patient's diagnosis is consistent with left radial fracture. Vital signs and exam are reassuring. No indication of compartment syndrome. No findings on ultrasound. Splint was replaced. Dr. Alphonzo Lemmings was consulted. Education about taking his daily aspirin was provided. Patient is to follow up with orthopedics as directed. Patient is given ED precautions to return to the ED for any worsening or new symptoms.     ____________________________________________  FINAL CLINICAL IMPRESSION(S) / ED DIAGNOSES  Final diagnoses:  Closed fracture of left elbow with routine healing, subsequent encounter      NEW MEDICATIONS STARTED DURING THIS VISIT:  New Prescriptions   No medications on file        This chart was dictated using voice recognition software/Dragon. Despite best efforts to proofread, errors can occur which can change the meaning. Any change was purely unintentional.    Enid Derry, PA-C 06/06/16 2359    Jeanmarie Plant, MD 06/10/16 [phone]

## 2016-06-06 NOTE — ED Triage Notes (Signed)
Patient ambulatory to triage with steady gait, without difficulty or distress noted; pt with swelling to hands/fingers today; seen here 5/27 for fx elbow, splint in place and appears loose at this time; pt st has not been elevating arm as instructing; st pain 3/10, good movement of fingers noted and W&D

## 2016-06-06 NOTE — ED Notes (Addendum)
Upon assessment pt's left arm swollen and bruised. Pt able to move all fingers and cap refill rapid.

## 2016-06-20 ENCOUNTER — Other Ambulatory Visit: Payer: Self-pay | Admitting: Orthopedic Surgery

## 2016-06-20 ENCOUNTER — Ambulatory Visit
Admission: RE | Admit: 2016-06-20 | Discharge: 2016-06-20 | Disposition: A | Discharge status: Home / Self Care | Payer: Medicare HMO | Source: Ambulatory Visit | Attending: Orthopedic Surgery | Admitting: Orthopedic Surgery

## 2016-06-20 DIAGNOSIS — S52022A Displaced fracture of olecranon process without intraarticular extension of left ulna, initial encounter for closed fracture: Secondary | ICD-10-CM | POA: Insufficient documentation

## 2016-06-20 DIAGNOSIS — M25022 Hemarthrosis, left elbow: Secondary | ICD-10-CM | POA: Diagnosis not present

## 2016-06-20 DIAGNOSIS — X58XXXA Exposure to other specified factors, initial encounter: Secondary | ICD-10-CM | POA: Insufficient documentation

## 2016-06-28 DIAGNOSIS — S52042A Displaced fracture of coronoid process of left ulna, initial encounter for closed fracture: Secondary | ICD-10-CM | POA: Insufficient documentation

## 2016-06-28 DIAGNOSIS — M25522 Pain in left elbow: Secondary | ICD-10-CM | POA: Insufficient documentation

## 2016-09-01 ENCOUNTER — Emergency Department: Payer: Medicare HMO

## 2016-09-01 ENCOUNTER — Emergency Department
Admission: EM | Admit: 2016-09-01 | Discharge: 2016-09-01 | Disposition: A | Discharge status: Home / Self Care | Payer: Medicare HMO | Attending: Emergency Medicine | Admitting: Emergency Medicine

## 2016-09-01 ENCOUNTER — Encounter: Payer: Self-pay | Admitting: Emergency Medicine

## 2016-09-01 DIAGNOSIS — Z7982 Long term (current) use of aspirin: Secondary | ICD-10-CM | POA: Insufficient documentation

## 2016-09-01 DIAGNOSIS — I130 Hypertensive heart and chronic kidney disease with heart failure and stage 1 through stage 4 chronic kidney disease, or unspecified chronic kidney disease: Secondary | ICD-10-CM | POA: Diagnosis not present

## 2016-09-01 DIAGNOSIS — I509 Heart failure, unspecified: Secondary | ICD-10-CM | POA: Insufficient documentation

## 2016-09-01 DIAGNOSIS — I2510 Atherosclerotic heart disease of native coronary artery without angina pectoris: Secondary | ICD-10-CM | POA: Diagnosis not present

## 2016-09-01 DIAGNOSIS — N132 Hydronephrosis with renal and ureteral calculous obstruction: Secondary | ICD-10-CM

## 2016-09-01 DIAGNOSIS — N201 Calculus of ureter: Secondary | ICD-10-CM | POA: Diagnosis not present

## 2016-09-01 DIAGNOSIS — R1032 Left lower quadrant pain: Secondary | ICD-10-CM | POA: Diagnosis present

## 2016-09-01 DIAGNOSIS — N189 Chronic kidney disease, unspecified: Secondary | ICD-10-CM | POA: Diagnosis not present

## 2016-09-01 DIAGNOSIS — Z79899 Other long term (current) drug therapy: Secondary | ICD-10-CM | POA: Insufficient documentation

## 2016-09-01 LAB — CBC WITH DIFFERENTIAL/PLATELET
BASOS PCT: 1 %
Basophils Absolute: 0 10*3/uL (ref 0–0.1)
Eosinophils Absolute: 0.1 10*3/uL (ref 0–0.7)
Eosinophils Relative: 1 %
HCT: 45 % (ref 40.0–52.0)
Hemoglobin: 15.1 g/dL (ref 13.0–18.0)
Lymphocytes Relative: 15 %
Lymphs Abs: 1.2 10*3/uL (ref 1.0–3.6)
MCH: 29.1 pg (ref 26.0–34.0)
MCHC: 33.6 g/dL (ref 32.0–36.0)
MCV: 86.7 fL (ref 80.0–100.0)
MONO ABS: 0.4 10*3/uL (ref 0.2–1.0)
Monocytes Relative: 5 %
Neutro Abs: 6.1 10*3/uL (ref 1.4–6.5)
Neutrophils Relative %: 78 %
PLATELETS: 116 10*3/uL — AB (ref 150–440)
RBC: 5.19 MIL/uL (ref 4.40–5.90)
RDW: 14.2 % (ref 11.5–14.5)
WBC: 7.8 10*3/uL (ref 3.8–10.6)

## 2016-09-01 LAB — COMPREHENSIVE METABOLIC PANEL
ALBUMIN: 4 g/dL (ref 3.5–5.0)
ALK PHOS: 86 U/L (ref 38–126)
ALT: 12 U/L — ABNORMAL LOW (ref 17–63)
ANION GAP: 9 (ref 5–15)
AST: 22 U/L (ref 15–41)
BILIRUBIN TOTAL: 1.8 mg/dL — AB (ref 0.3–1.2)
BUN: 25 mg/dL — AB (ref 6–20)
CALCIUM: 9.2 mg/dL (ref 8.9–10.3)
CO2: 23 mmol/L (ref 22–32)
Chloride: 107 mmol/L (ref 101–111)
Creatinine, Ser: 1.42 mg/dL — ABNORMAL HIGH (ref 0.61–1.24)
GFR calc Af Amer: 51 mL/min — ABNORMAL LOW (ref >60)
GFR, EST NON AFRICAN AMERICAN: 44 mL/min — AB (ref >60)
GLUCOSE: 144 mg/dL — AB (ref 65–99)
Potassium: 4.6 mmol/L (ref 3.5–5.1)
Sodium: 139 mmol/L (ref 135–145)
TOTAL PROTEIN: 6.7 g/dL (ref 6.5–8.1)

## 2016-09-01 LAB — URINALYSIS, COMPLETE (UACMP) WITH MICROSCOPIC
BACTERIA UA: NONE SEEN
Bilirubin Urine: NEGATIVE
GLUCOSE, UA: NEGATIVE mg/dL
Ketones, ur: NEGATIVE mg/dL
Leukocytes, UA: NEGATIVE
NITRITE: NEGATIVE
PH: 5 (ref 5.0–8.0)
PROTEIN: 30 mg/dL — AB
Specific Gravity, Urine: 1.025 (ref 1.005–1.030)

## 2016-09-01 LAB — LIPASE, BLOOD: Lipase: 29 U/L (ref 11–51)

## 2016-09-01 MED ORDER — IOPAMIDOL (ISOVUE-300) INJECTION 61%
30.0000 mL | Freq: Once | INTRAVENOUS | Status: AC | PRN
  Administered 2016-09-01: 30 mL via ORAL

## 2016-09-01 MED ORDER — ONDANSETRON 4 MG PO TBDP: 4.0000 mg | ORAL_TABLET | Freq: Three times a day (TID) | ORAL | 0 refills | Status: DC | PRN

## 2016-09-01 MED ORDER — OXYCODONE-ACETAMINOPHEN 5-325 MG PO TABS: 1.0000 | ORAL_TABLET | Freq: Four times a day (QID) | ORAL | 0 refills | Status: DC | PRN

## 2016-09-01 MED ORDER — SODIUM CHLORIDE 0.9 % IV BOLUS (SEPSIS)
1000.0000 mL | Freq: Once | INTRAVENOUS | Status: AC
  Administered 2016-09-01: 1000 mL via INTRAVENOUS

## 2016-09-01 MED ORDER — NAPROXEN 500 MG PO TABS: 500.0000 mg | ORAL_TABLET | Freq: Two times a day (BID) | ORAL | 0 refills | Status: DC

## 2016-09-01 MED ORDER — IOPAMIDOL (ISOVUE-300) INJECTION 61%
75.0000 mL | Freq: Once | INTRAVENOUS | Status: AC | PRN
  Administered 2016-09-01: 75 mL via INTRAVENOUS

## 2016-09-01 MED ORDER — TAMSULOSIN HCL 0.4 MG PO CAPS
0.4000 mg | ORAL_CAPSULE | Freq: Every day | ORAL | 0 refills | Status: DC
Start: 2016-09-01 — End: 2018-02-19

## 2016-09-01 MED ORDER — MORPHINE SULFATE (PF) 4 MG/ML IV SOLN
4.0000 mg | Freq: Once | INTRAVENOUS | Status: AC
  Administered 2016-09-01: 4 mg via INTRAVENOUS
  Filled 2016-09-01: qty 1

## 2016-09-01 MED ORDER — ONDANSETRON HCL 4 MG/2ML IJ SOLN
4.0000 mg | Freq: Once | INTRAMUSCULAR | Status: AC
  Administered 2016-09-01: 4 mg via INTRAVENOUS
  Filled 2016-09-01: qty 2

## 2016-09-01 NOTE — ED Notes (Signed)
Pt given coke to dink per request.

## 2016-09-01 NOTE — ED Notes (Signed)
Pt awake and alert. Pt taken off oxygen. Pt room air SpO2 94-96%. Pt finished one bottle contrast and started second bottle.

## 2016-09-01 NOTE — Discharge Instructions (Signed)
Results for orders placed or performed during the hospital encounter of 09/01/16  Comprehensive metabolic panel  Result Value Ref Range   Sodium 139 135 - 145 mmol/L   Potassium 4.6 3.5 - 5.1 mmol/L   Chloride 107 101 - 111 mmol/L   CO2 23 22 - 32 mmol/L   Glucose, Bld 144 (H) 65 - 99 mg/dL   BUN 25 (H) 6 - 20 mg/dL   Creatinine, Ser 1.61 (H) 0.61 - 1.24 mg/dL   Calcium 9.2 8.9 - 09.6 mg/dL   Total Protein 6.7 6.5 - 8.1 g/dL   Albumin 4.0 3.5 - 5.0 g/dL   AST 22 15 - 41 U/L   ALT 12 (L) 17 - 63 U/L   Alkaline Phosphatase 86 38 - 126 U/L   Total Bilirubin 1.8 (H) 0.3 - 1.2 mg/dL   GFR calc non Af Amer 44 (L) >60 mL/min   GFR calc Af Amer 51 (L) >60 mL/min   Anion gap 9 5 - 15  Lipase, blood  Result Value Ref Range   Lipase 29 11 - 51 U/L  CBC with Differential  Result Value Ref Range   WBC 7.8 3.8 - 10.6 K/uL   RBC 5.19 4.40 - 5.90 MIL/uL   Hemoglobin 15.1 13.0 - 18.0 g/dL   HCT 04.5 40.9 - 81.1 %   MCV 86.7 80.0 - 100.0 fL   MCH 29.1 26.0 - 34.0 pg   MCHC 33.6 32.0 - 36.0 g/dL   RDW 91.4 78.2 - 95.6 %   Platelets 116 (L) 150 - 440 K/uL   Neutrophils Relative % 78 %   Neutro Abs 6.1 1.4 - 6.5 K/uL   Lymphocytes Relative 15 %   Lymphs Abs 1.2 1.0 - 3.6 K/uL   Monocytes Relative 5 %   Monocytes Absolute 0.4 0.2 - 1.0 K/uL   Eosinophils Relative 1 %   Eosinophils Absolute 0.1 0 - 0.7 K/uL   Basophils Relative 1 %   Basophils Absolute 0.0 0 - 0.1 K/uL  Urinalysis, Complete w Microscopic  Result Value Ref Range   Color, Urine YELLOW (A) YELLOW   APPearance HAZY (A) CLEAR   Specific Gravity, Urine 1.025 1.005 - 1.030   pH 5.0 5.0 - 8.0   Glucose, UA NEGATIVE NEGATIVE mg/dL   Hgb urine dipstick LARGE (A) NEGATIVE   Bilirubin Urine NEGATIVE NEGATIVE   Ketones, ur NEGATIVE NEGATIVE mg/dL   Protein, ur 30 (A) NEGATIVE mg/dL   Nitrite NEGATIVE NEGATIVE   Leukocytes, UA NEGATIVE NEGATIVE   RBC / HPF TOO NUMEROUS TO COUNT 0 - 5 RBC/hpf   WBC, UA 6-30 0 - 5 WBC/hpf   Bacteria, UA NONE SEEN NONE SEEN   Squamous Epithelial / LPF 0-5 (A) NONE SEEN   Mucus PRESENT    Hyaline Casts, UA PRESENT    Ct Abdomen Pelvis W Contrast  Result Date: 09/01/2016 CLINICAL DATA:  Left lower quadrant pain with nausea and vomiting. EXAM: CT ABDOMEN AND PELVIS WITH CONTRAST TECHNIQUE: Multidetector CT imaging of the abdomen and pelvis was performed using the standard protocol following bolus administration of intravenous contrast. CONTRAST:  75mL ISOVUE-300 IOPAMIDOL (ISOVUE-300) INJECTION 61% COMPARISON:  10/27/2012 FINDINGS: Lower chest: Elevation of right hemidiaphragm. Lung bases are clear. There is a biventricular cardiac ICD. Coronary arteries are heavily calcified. Hepatobiliary: Calcified gallstones but no significant gallbladder distention or inflammatory changes. Colon is anterior to the liver and configuration of the liver is slightly atypical but this is unchanged from the previous imaging.  Punctate low-density structure in the liver on sequence 3, image 14 is probably an incidental finding. No suspicious liver lesions. Portal venous system is patent. No biliary dilatation. Pancreas: Normal appearance of the pancreas without inflammation or duct dilatation. Spleen: Normal appearance of spleen without enlargement. Again noted is the small nodular soft tissue just posterior to the spleen and this is unchanged since 2014 and may be associated with a splenule. Adrenals/Urinary Tract: Normal adrenal glands. There are bilateral renal cysts. Large exophytic cyst involving the lower pole of the right kidney measuring up to 8.1 cm. Exophytic cyst in the posterior left kidney measuring up to 5.0 cm. Left perinephric edema with moderate to severe left hydroureteroephrosis. There is edema along the left ureter. There is a 9 mm stone at the left ureterovesical junction. Otherwise, normal appearance of the bladder. Stomach/Bowel: No evidence for bowel obstruction or bowel dilatation. No focal  bowel inflammation. Small diverticula involving the distal aspect of the duodenum. Vascular/Lymphatic: Diffuse atherosclerotic calcifications involving the abdominal aorta and iliac arteries. No significant lymph node enlargement in the abdomen or pelvis. Reproductive: Calcifications in the prostate. Other: Small umbilical hernia containing fat. No free air. No significant free fluid. Left inguinal hernia containing fat and suspect postoperative changes in both inguinal areas. Musculoskeletal: No acute bone abnormality. Joint space narrowing and subchondral cyst formation in both hip joints. Mild curvature and degenerative changes in the lumbar spine. IMPRESSION: Obstructing 9 mm stone at the left ureterovesical junction. There is extensive edema around the left kidney and left ureter with moderate to severe left hydroureteronephrosis. No other definite kidney or ureter stones. Bilateral renal cysts. Cholelithiasis. Diffuse atherosclerotic disease including the coronary arteries. Electronically Signed   By: Richarda Overlie M.D.   On: 09/01/2016 09:30

## 2016-09-01 NOTE — ED Notes (Signed)
Pt returned from CT. Pt up to toilet independently, gait steady. Pt instructed to call if assistance needed.

## 2016-09-01 NOTE — ED Notes (Signed)
Patient transported to CT 

## 2016-09-01 NOTE — ED Provider Notes (Signed)
[facility] Emergency Department Provider Note  ____________________________________________  Time seen: Approximately 8:06 AM  I have reviewed the triage vital signs and the nursing notes.   HISTORY  Chief Complaint Abdominal Pain    HPI Spencer Coleman is a 83 y.o. male comes to the ED complaining of left lower quadrant abdominal pain that started gradually but worsened since midnight last night. Associated with nausea and vomiting. Nonradiating. Severe. Sharp. No aggravating or alleviating factors. Feels like kidney stones to him, which she has required surgical intervention for in the past. Denies chest pain shortness of breath fevers chills or sweats. No dizziness or syncope. No lower extremity weakness or paresthesias.     Past Medical History:  Diagnosis Date  . CAD (coronary artery disease)    Nonobstructive cardiac disease by cath  . CHF (congestive heart failure) (HCC)    nonischemic cardiomyopathy  . CKD (chronic kidney disease)   . Hyperlipidemia   . Hypertension   . Ventricular tachycardia [facility])    s/p ICD     Patient Active Problem List   Diagnosis Date Noted  . Malnutrition of moderate degree 02/08/2015  . Patellar fracture 01/09/2015  . Calculus of kidney 01/09/2015  . Congestive heart failure (HCC) 01/09/2015  . Patella fracture 01/09/2015  . Cardiomyopathy (HCC) 06/06/2013  . History of cardiac catheterization 06/06/2013  . BP (high blood pressure) 06/06/2013  . HLD (hyperlipidemia) 06/06/2013  . Ventricular tachycardia (HCC) 04/05/2013  . Automatic implantable cardioverter-defibrillator in situ 03/25/2013     Past Surgical History:  Procedure Laterality Date  . CARDIAC CATHETERIZATION    . CARDIAC DEFIBRILLATOR PLACEMENT    . INGUINAL HERNIA REPAIR    . LITHOTRIPSY    . ORIF left wrist fracture    . ORIF PATELLA Left 01/10/2015   Procedure: OPEN REDUCTION INTERNAL (ORIF) FIXATION PATELLA;  Surgeon: Donato Heinz, MD;   Location: [facility] ORS;  Service: Orthopedics;  Laterality: Left;  . ORIF PATELLA Left 02/06/2015   Procedure: OPEN REDUCTION INTERNAL (ORIF) FIXATION PATELLA;  Surgeon: Donato Heinz, MD;  Location: [facility] ORS;  Service: Orthopedics;  Laterality: Left;  . PACEMAKER INSERTION       Prior to Admission medications   Medication Sig Start Date End Date Taking? Authorizing Provider  aspirin 325 MG tablet Take 325 mg by mouth daily.   Yes [provider]  furosemide (LASIX) 20 MG tablet Take 1 tablet by mouth daily. 10/27/14  Yes [provider]  metoprolol (LOPRESSOR) 50 MG tablet Take 1 tablet by mouth 2 (two) times daily. 10/27/14  Yes [provider]  simvastatin (ZOCOR) 40 MG tablet Take 1 tablet by mouth daily at 6 PM.  10/27/14  Yes [provider]  naproxen (NAPROSYN) 500 MG tablet Take 1 tablet (500 mg total) by mouth 2 (two) times daily with a meal. 09/01/16   Sharman Cheek, MD  ondansetron (ZOFRAN ODT) 4 MG disintegrating tablet Take 1 tablet (4 mg total) by mouth every 8 (eight) hours as needed for nausea or vomiting. 09/01/16   Sharman Cheek, MD  oxyCODONE-acetaminophen (ROXICET) 5-325 MG tablet Take 1 tablet by mouth every 6 (six) hours as needed for severe pain. 09/01/16   Sharman Cheek, MD  tamsulosin (FLOMAX) 0.4 MG CAPS capsule Take 1 capsule (0.4 mg total) by mouth daily. 09/01/16   Sharman Cheek, MD  traMADol (ULTRAM) 50 MG tablet Take 1 tablet (50 mg total) by mouth every 6 (six) hours as needed for severe pain. Patient  not taking: Reported on 09/01/2016 06/04/16   Sharyn Creamer, MD     Allergies Patient has no known allergies.   Family History  Problem Relation Age of Onset  . CAD Mother   . Kidney cancer Father     Social History Social History  Substance Use Topics  . Smoking status: Never Smoker  . Smokeless tobacco: Never Used  . Alcohol use No    Review of Systems  Constitutional:   No fever or chills.  ENT:   No  sore throat. No rhinorrhea. Cardiovascular:   No chest pain or syncope. Respiratory:   No dyspnea or cough. Gastrointestinal:   Positive as above for abdominal pain with vomiting..  Musculoskeletal:   Negative for focal pain or swelling All other systems reviewed and are negative except as documented above in ROS and HPI.  ____________________________________________   PHYSICAL EXAM:  VITAL SIGNS: ED Triage Vitals  Enc Vitals Group     BP 09/01/16 0725 (!) 179/80     Pulse Rate 09/01/16 0725 61     Resp 09/01/16 0725 19     Temp 09/01/16 0725 (!) 97.3 F (36.3 C)     Temp Source 09/01/16 0725 Oral     SpO2 09/01/16 0725 93 %     Weight 09/01/16 0727 203 lb (92.1 kg)     Height 09/01/16 0727 5' 10.5" (1.791 m)     Head Circumference --      Peak Flow --      Pain Score 09/01/16 0722 9     Pain Loc --      Pain Edu? --      Excl. in GC? --     Vital signs reviewed, nursing assessments reviewed.   Constitutional:   Alert and oriented. Well appearing and in no distress. Eyes:   No scleral icterus.  EOMI. No nystagmus. No conjunctival pallor. PERRL. ENT   Head:   Normocephalic and atraumatic.   Nose:   No congestion/rhinnorhea.    Mouth/Throat:   MMM, no pharyngeal erythema. No peritonsillar mass.    Neck:   No meningismus. Full ROM Hematological/Lymphatic/Immunilogical:   No cervical lymphadenopathy. Cardiovascular:   RRR. Symmetric bilateral radial and DP pulses.  No murmurs.  Respiratory:   Normal respiratory effort without tachypnea/retractions. Breath sounds are clear and equal bilaterally. No wheezes/rales/rhonchi. Gastrointestinal:   Soft with generalized tenderness, worse in the left lower quadrant. Non distended. There is no CVA tenderness.  No rebound, rigidity, or guarding. Genitourinary:   deferred Musculoskeletal:   Normal range of motion in all extremities. No joint effusions.  No lower extremity tenderness.  No edema. Neurologic:   Normal speech  and language.  Motor grossly intact. No gross focal neurologic deficits are appreciated.  Skin:    Skin is warm, dry and intact. No rash noted.  No petechiae, purpura, or bullae.  ____________________________________________    LABS (pertinent positives/negatives) (all labs ordered are listed, but only abnormal results are displayed) Labs Reviewed  COMPREHENSIVE METABOLIC PANEL - Abnormal; Notable for the following:       Result Value   Glucose, Bld 144 (*)    BUN 25 (*)    Creatinine, Ser 1.42 (*)    ALT 12 (*)    Total Bilirubin 1.8 (*)    GFR calc non Af Amer 44 (*)    GFR calc Af Amer 51 (*)    All other components within normal limits  CBC WITH DIFFERENTIAL/PLATELET - Abnormal; Notable for the  following:    Platelets 116 (*)    All other components within normal limits  URINALYSIS, COMPLETE (UACMP) WITH MICROSCOPIC - Abnormal; Notable for the following:    Color, Urine YELLOW (*)    APPearance HAZY (*)    Hgb urine dipstick LARGE (*)    Protein, ur 30 (*)    Squamous Epithelial / LPF 0-5 (*)    All other components within normal limits  URINE CULTURE  LIPASE, BLOOD   ____________________________________________   EKG    ____________________________________________    RADIOLOGY  Ct Abdomen Pelvis W Contrast  Result Date: 09/01/2016 CLINICAL DATA:  Left lower quadrant pain with nausea and vomiting. EXAM: CT ABDOMEN AND PELVIS WITH CONTRAST TECHNIQUE: Multidetector CT imaging of the abdomen and pelvis was performed using the standard protocol following bolus administration of intravenous contrast. CONTRAST:  75mL ISOVUE-300 IOPAMIDOL (ISOVUE-300) INJECTION 61% COMPARISON:  10/27/2012 FINDINGS: Lower chest: Elevation of right hemidiaphragm. Lung bases are clear. There is a biventricular cardiac ICD. Coronary arteries are heavily calcified. Hepatobiliary: Calcified gallstones but no significant gallbladder distention or inflammatory changes. Colon is anterior to the  liver and configuration of the liver is slightly atypical but this is unchanged from the previous imaging. Punctate low-density structure in the liver on sequence 3, image 14 is probably an incidental finding. No suspicious liver lesions. Portal venous system is patent. No biliary dilatation. Pancreas: Normal appearance of the pancreas without inflammation or duct dilatation. Spleen: Normal appearance of spleen without enlargement. Again noted is the small nodular soft tissue just posterior to the spleen and this is unchanged since 2014 and may be associated with a splenule. Adrenals/Urinary Tract: Normal adrenal glands. There are bilateral renal cysts. Large exophytic cyst involving the lower pole of the right kidney measuring up to 8.1 cm. Exophytic cyst in the posterior left kidney measuring up to 5.0 cm. Left perinephric edema with moderate to severe left hydroureteroephrosis. There is edema along the left ureter. There is a 9 mm stone at the left ureterovesical junction. Otherwise, normal appearance of the bladder. Stomach/Bowel: No evidence for bowel obstruction or bowel dilatation. No focal bowel inflammation. Small diverticula involving the distal aspect of the duodenum. Vascular/Lymphatic: Diffuse atherosclerotic calcifications involving the abdominal aorta and iliac arteries. No significant lymph node enlargement in the abdomen or pelvis. Reproductive: Calcifications in the prostate. Other: Small umbilical hernia containing fat. No free air. No significant free fluid. Left inguinal hernia containing fat and suspect postoperative changes in both inguinal areas. Musculoskeletal: No acute bone abnormality. Joint space narrowing and subchondral cyst formation in both hip joints. Mild curvature and degenerative changes in the lumbar spine. IMPRESSION: Obstructing 9 mm stone at the left ureterovesical junction. There is extensive edema around the left kidney and left ureter with moderate to severe left  hydroureteronephrosis. No other definite kidney or ureter stones. Bilateral renal cysts. Cholelithiasis. Diffuse atherosclerotic disease including the coronary arteries. Electronically Signed   By: Richarda Overlie M.D.   On: 09/01/2016 09:30    ____________________________________________   PROCEDURES Procedures  ____________________________________________   INITIAL IMPRESSION / ASSESSMENT AND PLAN / ED COURSE  Pertinent labs & imaging results that were available during my care of the patient were reviewed by me and considered in my medical decision making (see chart for details).  Patient presents with abdominal pain and tenderness. His symptoms and exam concerning for diverticulitis, possibly UTI or kidney stone. Urinalysis shows hematuria. Labs otherwise unremarkable and at baseline. We'll get a CT scan for further evaluation.  IV saline bolus, Zofran and morphine for hydration and symptom relief.  Clinical Course as of Sep 01 1125  Thu Sep 01, 2016  0933 9mm L UVJ stone. Creatinine preserved, labs at baseline. No evidence of infection. Will d/w urology CT Abdomen Pelvis W Contrast [PS]  1021 D/w  uro Dr. Sherryl Barters. If sx controlled pt can f/u outpatient.   [PS]    Clinical Course User Index [PS] Sharman Cheek, MD     ----------------------------------------- 11:27 AM on 09/01/2016 -----------------------------------------  Patient calm and comfortable, well-appearing, tolerating oral intake. We'll discharge home to follow up with urology Dr. Achilles Dunk who has previously taken care of him.  ____________________________________________   FINAL CLINICAL IMPRESSION(S) / ED DIAGNOSES  Final diagnoses:  Left ureteral stone  Hydronephrosis with urinary obstruction due to ureteral calculus      New Prescriptions   NAPROXEN (NAPROSYN) 500 MG TABLET    Take 1 tablet (500 mg total) by mouth 2 (two) times daily with a meal.   ONDANSETRON (ZOFRAN ODT) 4 MG DISINTEGRATING TABLET    Take  1 tablet (4 mg total) by mouth every 8 (eight) hours as needed for nausea or vomiting.   OXYCODONE-ACETAMINOPHEN (ROXICET) 5-325 MG TABLET    Take 1 tablet by mouth every 6 (six) hours as needed for severe pain.   TAMSULOSIN (FLOMAX) 0.4 MG CAPS CAPSULE    Take 1 capsule (0.4 mg total) by mouth daily.     Portions of this note were generated with dragon dictation software. Dictation errors may occur despite best attempts at proofreading.    Sharman Cheek, MD 09/01/16 [phone]

## 2016-09-01 NOTE — ED Notes (Signed)
Responded to patients monitor alarm. SpO2 68% on room air. Patient placed on 4L [place]. SpO2 93%. Wille Celeste, primary RN notified.

## 2016-09-01 NOTE — ED Triage Notes (Signed)
Pt arrived via EMS from home for reports of LLQ abdominal pain that began approximately midnight. Pt reports some nausea and vomiting. Pt reports history of kidney stones. EMS reports 193/110 otherwise VSS. Pt alert and oriented on arrival.

## 2016-09-02 LAB — URINE CULTURE: Culture: NO GROWTH

## 2016-09-06 ENCOUNTER — Ambulatory Visit: Payer: Medicare HMO | Admitting: Urology

## 2016-09-06 ENCOUNTER — Encounter: Payer: Self-pay | Admitting: Urology

## 2016-09-06 VITALS — BP 125/64 | HR 80 | Ht 70.5 in | Wt 202.7 lb

## 2016-09-06 DIAGNOSIS — N200 Calculus of kidney: Secondary | ICD-10-CM

## 2016-09-06 LAB — MICROSCOPIC EXAMINATION: RBC, UA: NONE SEEN /hpf (ref 0–?)

## 2016-09-06 LAB — URINALYSIS, COMPLETE
BILIRUBIN UA: NEGATIVE
GLUCOSE, UA: NEGATIVE
KETONES UA: NEGATIVE
LEUKOCYTES UA: NEGATIVE
Nitrite, UA: NEGATIVE
PH UA: 5.5 (ref 5.0–7.5)
RBC UA: NEGATIVE
Urobilinogen, Ur: 1 mg/dL (ref 0.2–1.0)

## 2016-09-06 NOTE — Progress Notes (Signed)
09/06/2016 4:04 PM   Spencer Coleman 1930-06-19 [id number]  Referring provider: Barbette Reichmann, MD [address] [facility] [place], [place] [address]  Chief Complaint  Patient presents with  . Nephrolithiasis    HPI:   83 year old male seen today for follow-up from the emergency department where he was seen 5 days ago for left-sided flank pain and associated nausea and vomiting. In the emergency department, the patient was noted to have a 9 mm stone at the UVJ. His symptoms were controlled in the emergency department and he was discharged home.  Cystoscopy patient was seen any states that he thinks he might have passed the stone through his urethra because he felt some scratching and burning one particular time. He did not see anything. He denies any ongoing voiding symptoms, flank pain, or nausea/vomiting.  The patient has an extensive history of kidney stones, and has passed many many stones in his life. He is a patient of Dr. Achilles Dunk, but follows up today with me because he was unable to get an appointment to see Dr. Achilles Dunk.   PMH: Past Medical History:  Diagnosis Date  . CAD (coronary artery disease)    Nonobstructive cardiac disease by cath  . CHF (congestive heart failure) (HCC)    nonischemic cardiomyopathy  . CKD (chronic kidney disease)   . Hyperlipidemia   . Hypertension   . Ventricular tachycardia [facility])    s/p ICD    Surgical History: Past Surgical History:  Procedure Laterality Date  . CARDIAC CATHETERIZATION    . CARDIAC DEFIBRILLATOR PLACEMENT    . INGUINAL HERNIA REPAIR    . LITHOTRIPSY    . ORIF left wrist fracture    . ORIF PATELLA Left 01/10/2015   Procedure: OPEN REDUCTION INTERNAL (ORIF) FIXATION PATELLA;  Surgeon: Donato Heinz, MD;  Location: [facility] ORS;  Service: Orthopedics;  Laterality: Left;  . ORIF PATELLA Left 02/06/2015   Procedure: OPEN REDUCTION INTERNAL (ORIF) FIXATION PATELLA;  Surgeon: Donato Heinz, MD;  Location: [facility] ORS;   Service: Orthopedics;  Laterality: Left;  . PACEMAKER INSERTION      Home Medications:  Allergies as of 09/06/2016   No Known Allergies     Medication List       Accurate as of 09/06/16  4:04 PM. Always use your most recent med list.          aspirin 325 MG tablet Take 325 mg by mouth daily.   furosemide 20 MG tablet Commonly known as:  LASIX Take 1 tablet by mouth daily.   metoprolol tartrate 50 MG tablet Commonly known as:  LOPRESSOR Take 1 tablet by mouth 2 (two) times daily.   naproxen 500 MG tablet Commonly known as:  NAPROSYN Take 1 tablet (500 mg total) by mouth 2 (two) times daily with a meal.   ondansetron 4 MG disintegrating tablet Commonly known as:  ZOFRAN ODT Take 1 tablet (4 mg total) by mouth every 8 (eight) hours as needed for nausea or vomiting.   oxyCODONE-acetaminophen 5-325 MG tablet Commonly known as:  ROXICET Take 1 tablet by mouth every 6 (six) hours as needed for severe pain.   simvastatin 40 MG tablet Commonly known as:  ZOCOR Take 1 tablet by mouth daily at 6 PM.   tamsulosin 0.4 MG Caps capsule Commonly known as:  FLOMAX Take 1 capsule (0.4 mg total) by mouth daily.   traMADol 50 MG tablet Commonly known as:  ULTRAM Take 1 tablet (50 mg total) by  mouth every 6 (six) hours as needed for severe pain.            Discharge Care Instructions        Start     Ordered   09/06/16 0000  Urinalysis, Complete     09/06/16 1503      Allergies: No Known Allergies  Family History: Family History  Problem Relation Age of Onset  . CAD Mother   . Kidney cancer Father   . Prostate cancer Neg Hx   . Bladder Cancer Neg Hx     Social History:  reports that he has never smoked. He has never used smokeless tobacco. He reports that he does not drink alcohol or use drugs.  ROS: UROLOGY Frequent Urination?: No Hard to postpone urination?: No Burning/pain with urination?: No Get up at night to urinate?: Yes Leakage of urine?:  No Urine stream starts and stops?: No Trouble starting stream?: No Do you have to strain to urinate?: No Blood in urine?: No Urinary tract infection?: No Sexually transmitted disease?: No Injury to kidneys or bladder?: No Painful intercourse?: No Weak stream?: No Erection problems?: Yes Penile pain?: No  Gastrointestinal Nausea?: Yes Vomiting?: Yes Indigestion/heartburn?: No Diarrhea?: No Constipation?: No  Constitutional Fever: No Night sweats?: No Weight loss?: No Fatigue?: Yes  Skin Skin rash/lesions?: No Itching?: No  Eyes Blurred vision?: No Double vision?: No  Ears/Nose/Throat Sore throat?: No Sinus problems?: Yes  Hematologic/Lymphatic Swollen glands?: No Easy bruising?: No  Cardiovascular Leg swelling?: No Chest pain?: No  Respiratory Cough?: No Shortness of breath?: Yes  Endocrine Excessive thirst?: No  Musculoskeletal Back pain?: No Joint pain?: No  Neurological Headaches?: No Dizziness?: No  Psychologic Depression?: No Anxiety?: No  Physical Exam: BP 125/64 (BP Location: Left Arm, Patient Position: Sitting, Cuff Size: Normal)   Pulse 80   Ht 5' 10.5" (1.791 m)   Wt 91.9 kg (202 lb 11.2 oz)   BMI 28.67 kg/m   Constitutional:  Alert and oriented, No acute distress. HEENT: [place] AT, moist mucus membranes.  Trachea midline, no masses. Cardiovascular: No clubbing, cyanosis, or edema. Respiratory: Normal respiratory effort, no increased work of breathing. GI: Abdomen is soft, nontender, nondistended, no abdominal masses GU: No CVA tenderness. Skin: No rashes, bruises or suspicious lesions. Lymph: No cervical or inguinal adenopathy. Neurologic: Grossly intact, no focal deficits, moving all 4 extremities. Psychiatric: Normal mood and affect.  Laboratory Data: Lab Results  Component Value Date   WBC 7.8 09/01/2016   HGB 15.1 09/01/2016   HCT 45.0 09/01/2016   MCV 86.7 09/01/2016   PLT 116 (L) 09/01/2016    Lab Results   Component Value Date   CREATININE 1.42 (H) 09/01/2016    No results found for: PSA  No results found for: TESTOSTERONE  No results found for: HGBA1C  Urinalysis    Component Value Date/Time   COLORURINE YELLOW (A) 09/01/2016 0735   APPEARANCEUR HAZY (A) 09/01/2016 0735   APPEARANCEUR Clear 04/09/2013 1315   LABSPEC 1.025 09/01/2016 0735   LABSPEC 1.009 04/09/2013 1315   PHURINE 5.0 09/01/2016 0735   GLUCOSEU NEGATIVE 09/01/2016 0735   GLUCOSEU Negative 04/09/2013 1315   HGBUR LARGE (A) 09/01/2016 0735   BILIRUBINUR NEGATIVE 09/01/2016 0735   BILIRUBINUR Negative 04/09/2013 1315   KETONESUR NEGATIVE 09/01/2016 0735   PROTEINUR 30 (A) 09/01/2016 0735   NITRITE NEGATIVE 09/01/2016 0735   LEUKOCYTESUR NEGATIVE 09/01/2016 0735   LEUKOCYTESUR Negative 04/09/2013 1315    The patient's urinalysis from today demonstrates  no evidence of microscopic hematuria.  Pertinent Imaging: I have independently reviewed the patient's CT scan which demonstrates a large stone right at the ureteral orifice within the bladder wall measuring 9 mm. He had associated proximal hydroureteronephrosis. He also has bilateral simple renal cyst.  Assessment & Plan:  The patient is asymptomatic and has no evidence of blood in his urine. I suspect based on his history and objective findings that the stone is passed.  1. Kidney stones Given that the patient is passed a stone and is asymptomatic I recommended no additional treatment or imaging. The patient is passed many stones in the past, but currently is kidneys are empty. He does have some simple renal cyst which required no additional follow-up imaging.  Our plan this point is to have the patient return to [place] as needed for acute management of any symptoms. Otherwise, the patient will return to his primary urologist, Dr. Achilles Dunk. - Urinalysis, Complete   No Follow-up on file.  Crist Fat, MD  [facility] [address] [place], [place] [address] [phone]

## 2017-03-06 ENCOUNTER — Other Ambulatory Visit: Payer: Self-pay

## 2017-03-06 ENCOUNTER — Encounter: Payer: Self-pay | Admitting: Emergency Medicine

## 2017-03-06 ENCOUNTER — Emergency Department
Admission: EM | Admit: 2017-03-06 | Discharge: 2017-03-07 | Disposition: A | Discharge status: Home / Self Care | Payer: Medicare HMO | Attending: Emergency Medicine | Admitting: Emergency Medicine

## 2017-03-06 ENCOUNTER — Emergency Department: Payer: Medicare HMO

## 2017-03-06 DIAGNOSIS — I2510 Atherosclerotic heart disease of native coronary artery without angina pectoris: Secondary | ICD-10-CM | POA: Insufficient documentation

## 2017-03-06 DIAGNOSIS — I130 Hypertensive heart and chronic kidney disease with heart failure and stage 1 through stage 4 chronic kidney disease, or unspecified chronic kidney disease: Secondary | ICD-10-CM | POA: Insufficient documentation

## 2017-03-06 DIAGNOSIS — I472 Ventricular tachycardia, unspecified: Secondary | ICD-10-CM

## 2017-03-06 DIAGNOSIS — Z7982 Long term (current) use of aspirin: Secondary | ICD-10-CM | POA: Diagnosis not present

## 2017-03-06 DIAGNOSIS — Z79899 Other long term (current) drug therapy: Secondary | ICD-10-CM | POA: Insufficient documentation

## 2017-03-06 DIAGNOSIS — I509 Heart failure, unspecified: Secondary | ICD-10-CM | POA: Diagnosis not present

## 2017-03-06 DIAGNOSIS — Y69 Unspecified misadventure during surgical and medical care: Secondary | ICD-10-CM | POA: Insufficient documentation

## 2017-03-06 DIAGNOSIS — N189 Chronic kidney disease, unspecified: Secondary | ICD-10-CM | POA: Insufficient documentation

## 2017-03-06 DIAGNOSIS — Z4502 Encounter for adjustment and management of automatic implantable cardiac defibrillator: Secondary | ICD-10-CM

## 2017-03-06 DIAGNOSIS — T82198A Other mechanical complication of other cardiac electronic device, initial encounter: Secondary | ICD-10-CM | POA: Diagnosis present

## 2017-03-06 LAB — CBC WITH DIFFERENTIAL/PLATELET
Basophils Absolute: 0 10*3/uL (ref 0–0.1)
Basophils Relative: 1 %
Eosinophils Absolute: 0.2 10*3/uL (ref 0–0.7)
Eosinophils Relative: 3 %
HEMATOCRIT: 44.4 % (ref 40.0–52.0)
HEMOGLOBIN: 14.9 g/dL (ref 13.0–18.0)
LYMPHS ABS: 1.9 10*3/uL (ref 1.0–3.6)
Lymphocytes Relative: 30 %
MCH: 29.5 pg (ref 26.0–34.0)
MCHC: 33.5 g/dL (ref 32.0–36.0)
MCV: 88.2 fL (ref 80.0–100.0)
MONO ABS: 0.5 10*3/uL (ref 0.2–1.0)
Monocytes Relative: 8 %
NEUTROS ABS: 3.6 10*3/uL (ref 1.4–6.5)
Neutrophils Relative %: 58 %
Platelets: 110 10*3/uL — ABNORMAL LOW (ref 150–440)
RBC: 5.03 MIL/uL (ref 4.40–5.90)
RDW: 14.1 % (ref 11.5–14.5)
WBC: 6.3 10*3/uL (ref 3.8–10.6)

## 2017-03-06 LAB — BASIC METABOLIC PANEL
Anion gap: 7 (ref 5–15)
BUN: 26 mg/dL — ABNORMAL HIGH (ref 6–20)
CALCIUM: 8.9 mg/dL (ref 8.9–10.3)
CHLORIDE: 107 mmol/L (ref 101–111)
CO2: 26 mmol/L (ref 22–32)
CREATININE: 1.24 mg/dL (ref 0.61–1.24)
GFR calc Af Amer: 60 mL/min — ABNORMAL LOW (ref >60)
GFR calc non Af Amer: 52 mL/min — ABNORMAL LOW (ref >60)
GLUCOSE: 103 mg/dL — AB (ref 65–99)
Potassium: 4.2 mmol/L (ref 3.5–5.1)
Sodium: 140 mmol/L (ref 135–145)

## 2017-03-06 LAB — TROPONIN I: Troponin I: 0.05 ng/mL (ref <0.03)

## 2017-03-06 MED ORDER — METOPROLOL TARTRATE 50 MG PO TABS
100.0000 mg | ORAL_TABLET | Freq: Once | ORAL | Status: AC
  Administered 2017-03-06: 100 mg via ORAL
  Filled 2017-03-06: qty 2

## 2017-03-06 NOTE — ED Triage Notes (Addendum)
Pt presents to ED after his defibrillator went off while he was at home combing his hair. Pt states [facility] and has never had any problems. Denies pain or sob. Talkative and answering questions without difficulty.

## 2017-03-06 NOTE — ED Notes (Signed)
Pt reports his defibrillator went off at 1800 while combing his hair tonight.  No chest pain or sob.  md at bedside.  Cardiac pads in place and iv started.  Labs sent.  pacede rhythm on monitor at 60 .  Pacemaker interrogated with [facility] equipment with success.  Pt tolerated procedure well.  Family with pt.  .Spencer Coleman

## 2017-03-06 NOTE — ED Notes (Signed)
[facility] called because no report came back.  [facility] report pt not in [employer] database.  md aware.  [employer] device did not work nor did [employer].  Dr Pershing Proudschaevitz aware.  Pt does not have an id card for pacemaker/defib

## 2017-03-06 NOTE — ED Provider Notes (Signed)
[facility] Emergency Department Provider Note  ____________________________________________   First MD Initiated Contact with Patient 03/06/17 2006     (approximate)  I have reviewed the triage vital signs and the nursing notes.   HISTORY  Chief Complaint AICD Problem   HPI Spencer Coleman is a 84 y.o. male with history of coronary artery disease as well as CHF with an AICD who had one shock delivered just prior to arrival.  The patient says that he was feeling frustrated because he was having difficulty buttoning his shirt when he raised his hands over his head and felt a shock x1.  He did not have any chest pain prior to the shock.  No palpitations or near syncopal feeling.  He says that he continues to feel at his baseline without any complaints at this time.  Is compliant with his medications including amiodarone and a beta-blocker.  Past Medical History:  Diagnosis Date  . CAD (coronary artery disease)    Nonobstructive cardiac disease by cath  . CHF (congestive heart failure) (HCC)    nonischemic cardiomyopathy  . CKD (chronic kidney disease)   . Hyperlipidemia   . Hypertension   . Ventricular tachycardia [facility])    s/p ICD    Patient Active Problem List   Diagnosis Date Noted  . Malnutrition of moderate degree 02/08/2015  . Patellar fracture 01/09/2015  . Calculus of kidney 01/09/2015  . Congestive heart failure (HCC) 01/09/2015  . Patella fracture 01/09/2015  . Cardiomyopathy (HCC) 06/06/2013  . History of cardiac catheterization 06/06/2013  . BP (high blood pressure) 06/06/2013  . HLD (hyperlipidemia) 06/06/2013  . Ventricular tachycardia (HCC) 04/05/2013  . Automatic implantable cardioverter-defibrillator in situ 03/25/2013    Past Surgical History:  Procedure Laterality Date  . CARDIAC CATHETERIZATION    . CARDIAC DEFIBRILLATOR PLACEMENT    . ELBOW SURGERY    . INGUINAL HERNIA REPAIR    . LITHOTRIPSY    . ORIF left wrist fracture     . ORIF PATELLA Left 01/10/2015   Procedure: OPEN REDUCTION INTERNAL (ORIF) FIXATION PATELLA;  Surgeon: Donato Heinz, MD;  Location: [facility] ORS;  Service: Orthopedics;  Laterality: Left;  . ORIF PATELLA Left 02/06/2015   Procedure: OPEN REDUCTION INTERNAL (ORIF) FIXATION PATELLA;  Surgeon: Donato Heinz, MD;  Location: [facility] ORS;  Service: Orthopedics;  Laterality: Left;  . PACEMAKER INSERTION      Prior to Admission medications   Medication Sig Start Date End Date Taking? Authorizing Provider  aspirin 325 MG tablet Take 325 mg by mouth daily.    [provider]  furosemide (LASIX) 20 MG tablet Take 1 tablet by mouth daily. 10/27/14   [provider]  metoprolol (LOPRESSOR) 50 MG tablet Take 1 tablet by mouth 2 (two) times daily. 10/27/14   [provider]  naproxen (NAPROSYN) 500 MG tablet Take 1 tablet (500 mg total) by mouth 2 (two) times daily with a meal. 09/01/16   Sharman Cheek, MD  ondansetron (ZOFRAN ODT) 4 MG disintegrating tablet Take 1 tablet (4 mg total) by mouth every 8 (eight) hours as needed for nausea or vomiting. 09/01/16   Sharman Cheek, MD  oxyCODONE-acetaminophen (ROXICET) 5-325 MG tablet Take 1 tablet by mouth every 6 (six) hours as needed for severe pain. 09/01/16   Sharman Cheek, MD  simvastatin (ZOCOR) 40 MG tablet Take 1 tablet by mouth daily at 6 PM.  10/27/14   [provider]  tamsulosin (FLOMAX) 0.4 MG CAPS capsule  Take 1 capsule (0.4 mg total) by mouth daily. 09/01/16   Sharman CheekStafford, Phillip, MD  traMADol (ULTRAM) 50 MG tablet Take 1 tablet (50 mg total) by mouth every 6 (six) hours as needed for severe pain. 06/04/16   Sharyn CreamerQuale, Mark, MD    Allergies Patient has no known allergies.  Family History  Problem Relation Age of Onset  . CAD Mother   . Kidney cancer Father   . Prostate cancer Neg Hx   . Bladder Cancer Neg Hx     Social History Social History   Tobacco Use  . Smoking status: Never Smoker  . Smokeless  tobacco: Never Used  Substance Use Topics  . Alcohol use: No    Alcohol/week: 0.0 oz  . Drug use: No    Review of Systems  Constitutional: No fever/chills Eyes: No visual changes. ENT: No sore throat. Cardiovascular: Denies chest pain. Respiratory: Denies shortness of breath. Gastrointestinal: No abdominal pain.  No nausea, no vomiting.  No diarrhea.  No constipation. Genitourinary: Negative for dysuria. Musculoskeletal: Negative for back pain. Skin: Negative for rash. Neurological: Negative for headaches, focal weakness or numbness.   ____________________________________________   PHYSICAL EXAM:  VITAL SIGNS: ED Triage Vitals  Enc Vitals Group     BP 03/06/17 2015 (!) 168/86     Pulse Rate 03/06/17 2015 64     Resp 03/06/17 2015 18     Temp 03/06/17 2015 97.7 F (36.5 C)     Temp Source 03/06/17 2015 Oral     SpO2 03/06/17 2015 95 %     Weight 03/06/17 2017 206 lb (93.4 kg)     Height 03/06/17 2017 5\' 10"  (1.778 m)     Head Circumference --      Peak Flow --      Pain Score 03/06/17 2017 0     Pain Loc --      Pain Edu? --      Excl. in GC? --     Constitutional: Alert and oriented. Well appearing and in no acute distress. Eyes: Conjunctivae are normal.  Head: Atraumatic. Nose: No congestion/rhinnorhea. Mouth/Throat: Mucous membranes are moist.  Neck: No stridor.   Cardiovascular: Normal rate, regular rhythm. Grossly normal heart sounds.   Respiratory: Normal respiratory effort.  No retractions. Lungs CTAB. Gastrointestinal: Soft and nontender. No distention.  Musculoskeletal: No lower extremity tenderness nor edema.  No joint effusions. Neurologic:  Normal speech and language. No gross focal neurologic deficits are appreciated. Skin:  Skin is warm, dry and intact. No rash noted. Psychiatric: Mood and affect are normal. Speech and behavior are normal.  ____________________________________________   LABS (all labs ordered are listed, but only abnormal  results are displayed)  Labs Reviewed - No data to display ____________________________________________  EKG  ED ECG REPORT I, Arelia Longestavid M Quentez Lober, the attending physician, personally viewed and interpreted this ECG.   Date: 03/06/2017  EKG Time: 2016  Rate: 67  Rhythm: Atrial ventricular dual paced rhythm  Axis: Normal  Intervals:none  ST&T Change: No ST segment elevation or depression.  No abnormal T wave inversion.  ____________________________________________  RADIOLOGY  Chest x-ray without any acute abnormality.  Left-sided pacemaker unchanged in position. ____________________________________________   PROCEDURES  Procedure(s) performed:   Procedures  Critical Care performed:   ____________________________________________   INITIAL IMPRESSION / ASSESSMENT AND PLAN / ED COURSE  Pertinent labs & imaging results that were available during my care of the patient were reviewed by me and considered in my medical decision making (  see chart for details).  DDX: Arrhythmia, MI, pacemaker malfunction, defib malfunction, electrolyte abnormality As part of my medical decision making, I reviewed the following data within the electronic MEDICAL RECORD NUMBER Old chart reviewed   ----------------------------------------- 8:50 PM on 03/06/2017 -----------------------------------------  I discussed the case with Dr. Gwen Pounds was on-call for the [facility] clinic.  He recommends interrogating the pacer and not starting amiodarone at this time.  We will continue to monitor the patient.  Patient placed on the fibrillator pads. Clinical Course as of Mar 06 2318  Mon Mar 06, 2017  2248 Discussed the case with Dr. Gwen Pounds after receiving the port from [employer].  Pacer was interrogated and patient was found to have one shock delivered at 1943.  The patient had a ventricular rate sustained greater than 200 bpm for 18 seconds.  The pacer defibrillator interpreted this is ventricular  tachycardia.  Patient, before that, also had intermittent episodes of tachycardia with an atrial rate up to 231 and a ventricular rate up to 200 for about 48 minutes.  Dr. Gwen Pounds recommends, as long as the patient is feeling well and labs are baseline, that the patient have his beta-blocker double any follow-up in the office.  However, the patient does not feel well or has lab abnormalities concerning for ischemia that he should be admitted to the hospital.  [DS]    Clinical Course User Index [DS] Myrna Blazer, MD    ----------------------------------------- 11:20 PM on 03/06/2017 -----------------------------------------  Patient continues to be asymptomatic.  Plan is to recheck second troponin and observe patient on telemetry monitor for another hour.  If troponin does not show significant increase patient continues to feel well he will be discharged.  He is aware of the plan to increase his beta-blocker dosage 2 tabs, twice a day and to follow-up with Dr. Darrold Junker in the morning.  He is understanding of this plan willing to comply. ____________________________________________   FINAL CLINICAL IMPRESSION(S) / ED DIAGNOSES  Ventricular tachycardia.  AICD shock.    NEW MEDICATIONS STARTED DURING THIS VISIT:  New Prescriptions   No medications on file     Note:  This document was prepared using Dragon voice recognition software and may include unintentional dictation errors.     Myrna Blazer, MD 03/06/17 [phone]

## 2017-03-06 NOTE — ED Notes (Signed)
[employer] interrogating device worked.  md aware

## 2017-03-06 NOTE — ED Notes (Signed)
Pt alert.  No chest pain or sob.  Iv in place  Paced rhythm on monitor at 60.   Family with pt.

## 2017-03-06 NOTE — ED Provider Notes (Addendum)
-----------------------------------------   11:12 PM on 03/06/2017 -----------------------------------------   Assuming care from Dr. Pershing ProudSchaevitz.  In short, Spencer Coleman is a 84 y.o. male with a chief complaint of AICD problem.  Refer to the original H&P for additional details.  The current plan of care is to follow up repeat troponin and reassess.  Plan is to follow up with Dr. Darrold JunkerParaschos in clinic.    Labs Reviewed  TROPONIN I - Abnormal; Notable for the following components:      Result Value   Troponin I 0.05 (*)    All other components within normal limits  CBC WITH DIFFERENTIAL/PLATELET - Abnormal; Notable for the following components:   Platelets 110 (*)    All other components within normal limits  BASIC METABOLIC PANEL - Abnormal; Notable for the following components:   Glucose, Bld 103 (*)    BUN 26 (*)    GFR calc non Af Amer 52 (*)    GFR calc Af Amer 60 (*)    All other components within normal limits  TROPONIN I - Abnormal; Notable for the following components:   Troponin I 0.12 (*)    All other components within normal limits    ----------------------------------------- 1:59 AM on 03/07/2017 -----------------------------------------  Unfortunately the lab system is down and we do not yet have a result on the troponin with no ETA for the result.  I reassessed the patient and he says he has not had any additional issues including no shocks and no chest pain since the original event.  He states that he feels good and wants to go home.  I think that is appropriate.  Dr. Gwen PoundsKowalski did not mention repeating a second troponin, and given that he has been asymptomatic and that we can expect some elevation of troponin given that he did have a defibrillation, I think it is reasonable for him to go home and follow-up with a phone call later today to the office of Dr. Darrold JunkerParaschos.  I gave strict return precautions and he and his son understand and agree with the plan.  Reportedly his son  is his legal guardian and he is at bedside and agrees with the plan.   (Note that documentation was delayed due to multiple ED patients requiring immediate care.)  Patient's troponin came back eventually, slightly more elevated at 0.12, but given that he had a shock from the AICD and he remained asymptomatic (and will follow up today with Dr. Darrold JunkerParaschos), I feel this is to be anticipated and acceptable.   Final diagnoses:  Ventricular tachycardia [facility](HCC)  Defibrillator discharge      Loleta RoseForbach, Paco Cislo, MD 03/07/17 [id number]    Loleta RoseForbach, Jeniah Kishi, MD 03/07/17 [phone]

## 2017-03-06 NOTE — ED Notes (Signed)
ED Provider at bedside. 

## 2017-03-07 LAB — TROPONIN I: Troponin I: 0.12 ng/mL (ref <0.03)

## 2017-09-24 IMAGING — CR DG KNEE COMPLETE 4+V*L*
5 series · 5 of 5 positions shown · non-contrast
Comparison: None.

CLINICAL DATA: Fall on concrete. Left patellar pain and abrasion.
Initial encounter.

EXAM:
LEFT KNEE - COMPLETE 4+ VIEW

[knee ap]
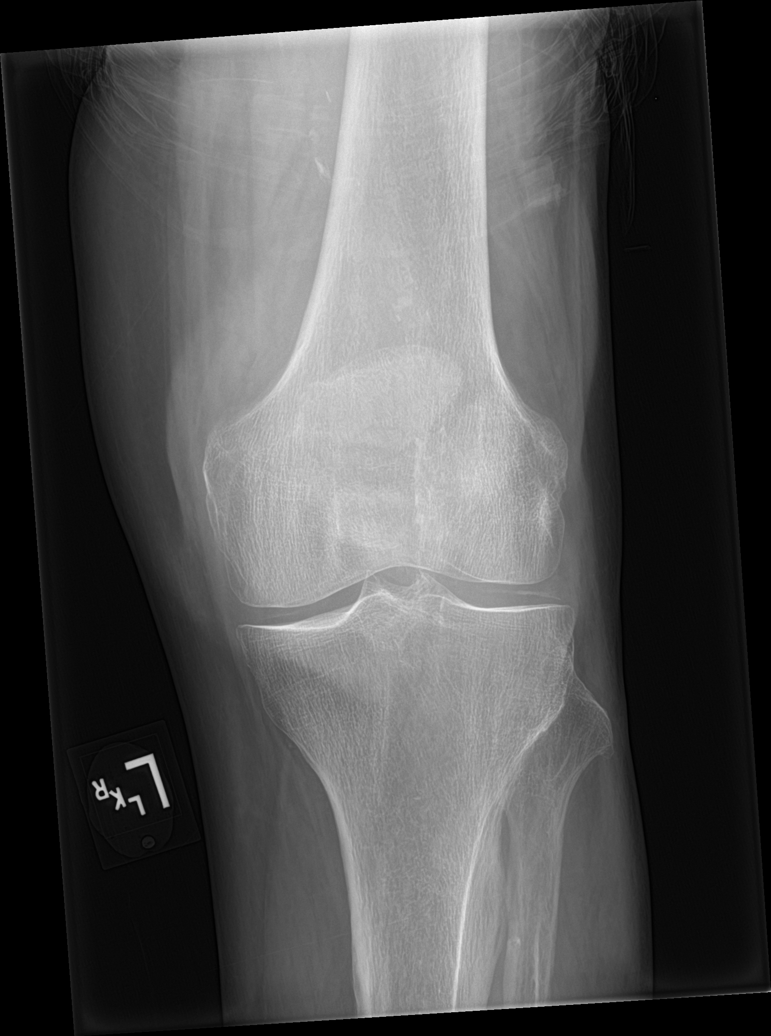

[knee obl (1 of 2)]
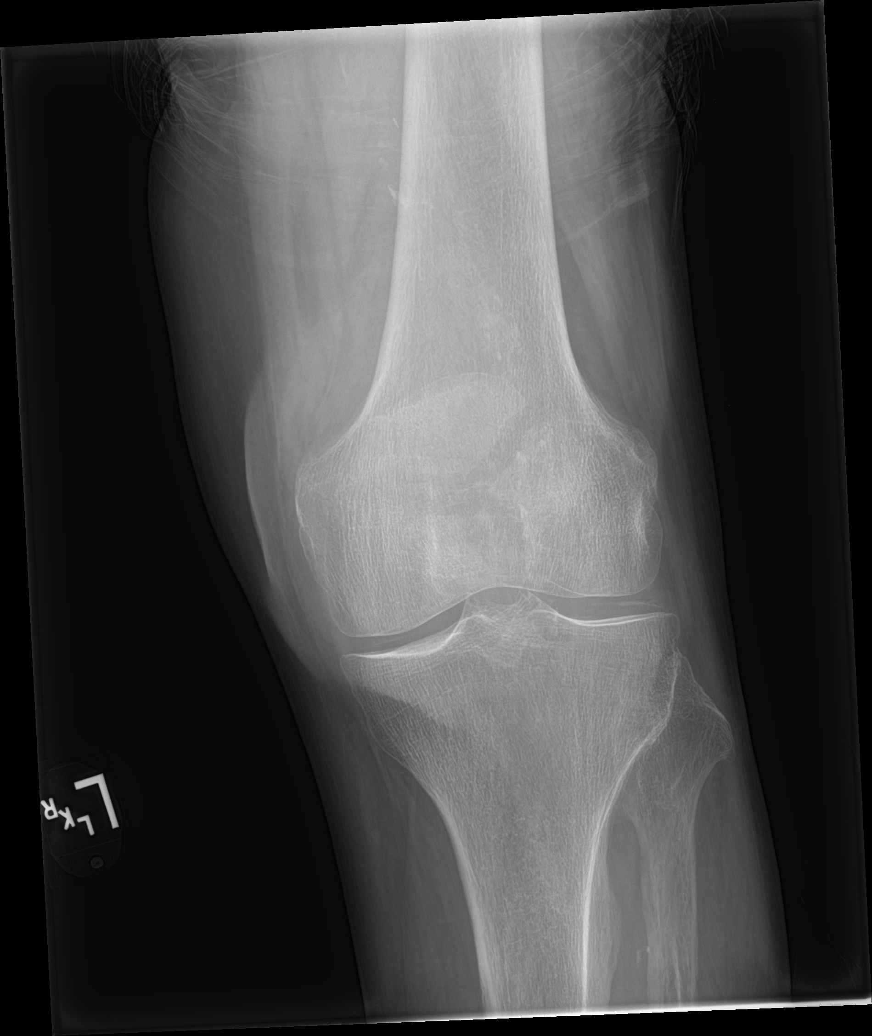

[knee obl (2 of 2)]
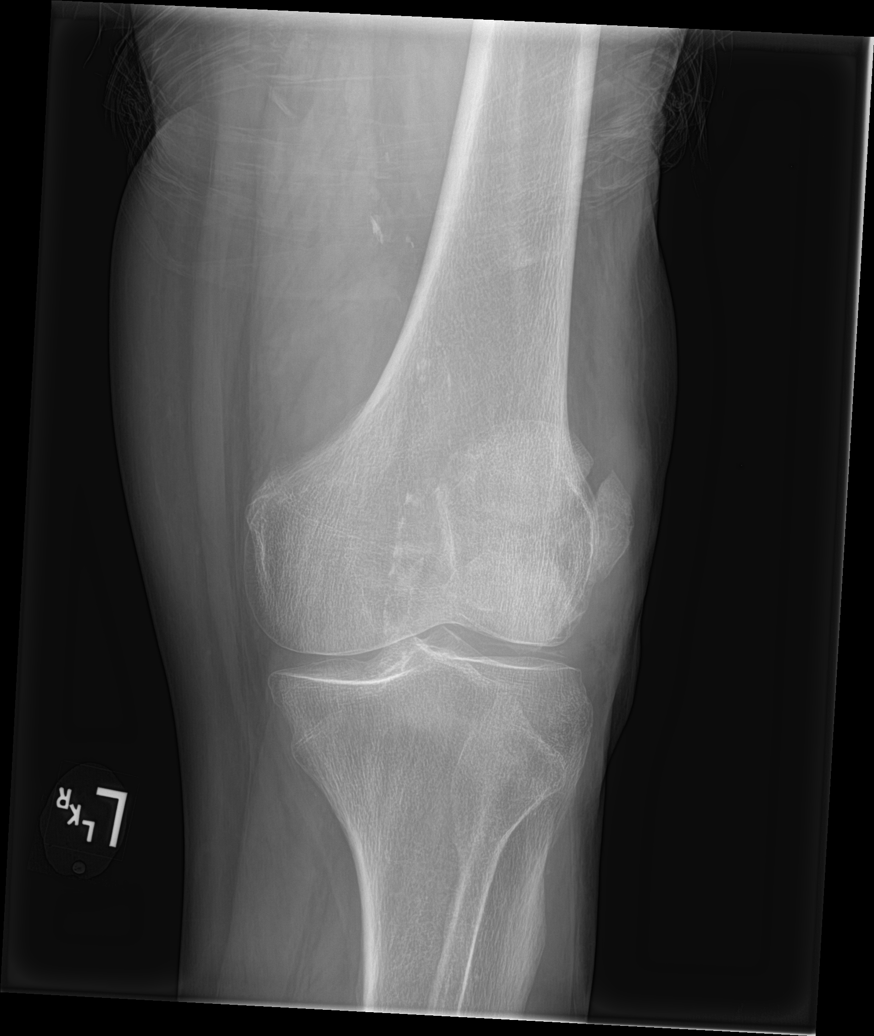

[knee lat]
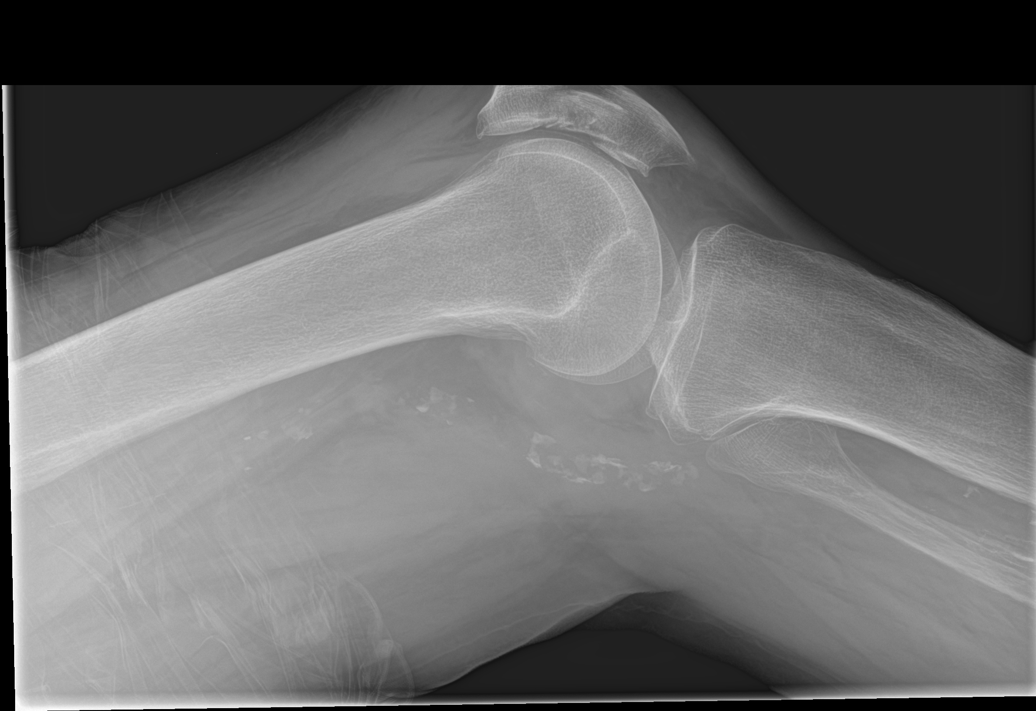

[sunrise]
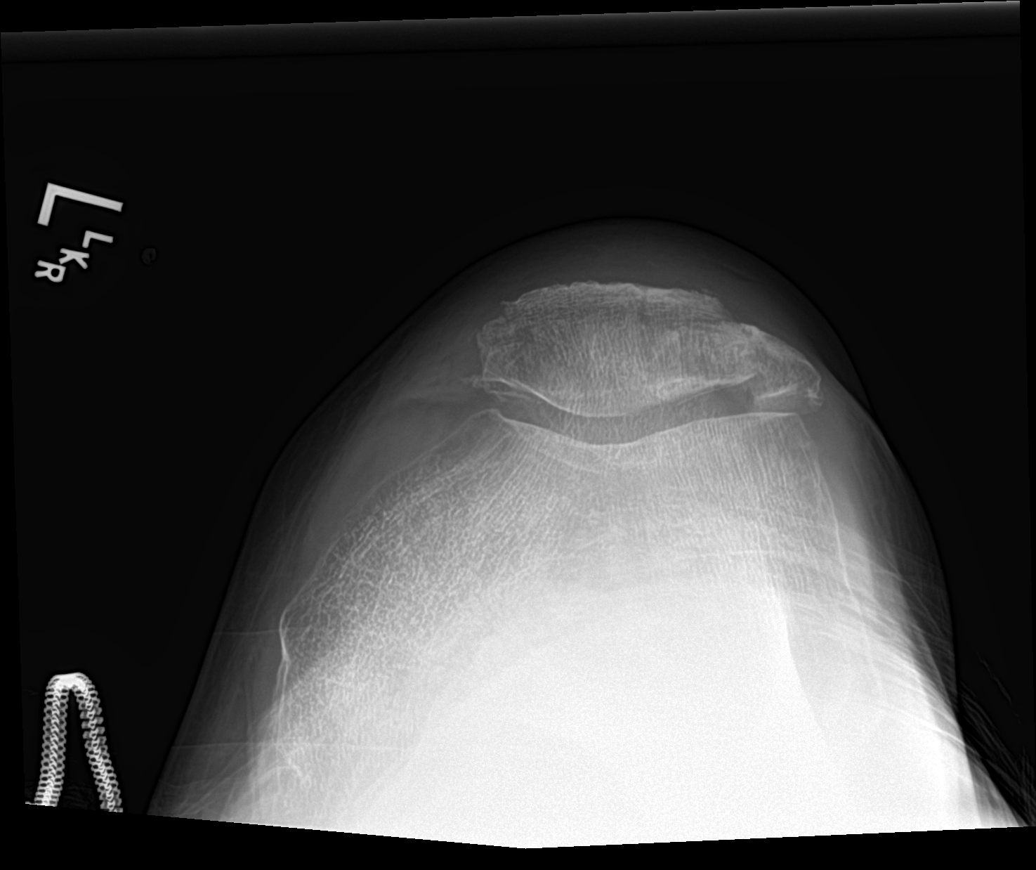

[5 of 5 positions shown; findings below may reference images not displayed]

FINDINGS: Comminuted fracture is seen involving the patella, with mild
displacement of fracture fragments. Prominent prepatellar soft
tissue swelling is seen as well as small lipohemarthrosis.

No other fractures are identified. No evidence of dislocation.
Peripheral vascular calcification noted.
IMPRESSION: Comminuted patellar fracture. Associated small lipohemarthrosis and
soft tissue swelling.

## 2017-09-24 IMAGING — CR DG CHEST 1V PORT
1 series · 1 of 1 positions shown · non-contrast
Comparison: 10/27/2012 chest radiograph

CLINICAL DATA: 84-year-old male - preoperative respiratory
examination prior to knee surgery.

EXAM:
PORTABLE CHEST 1 VIEW

[ap]
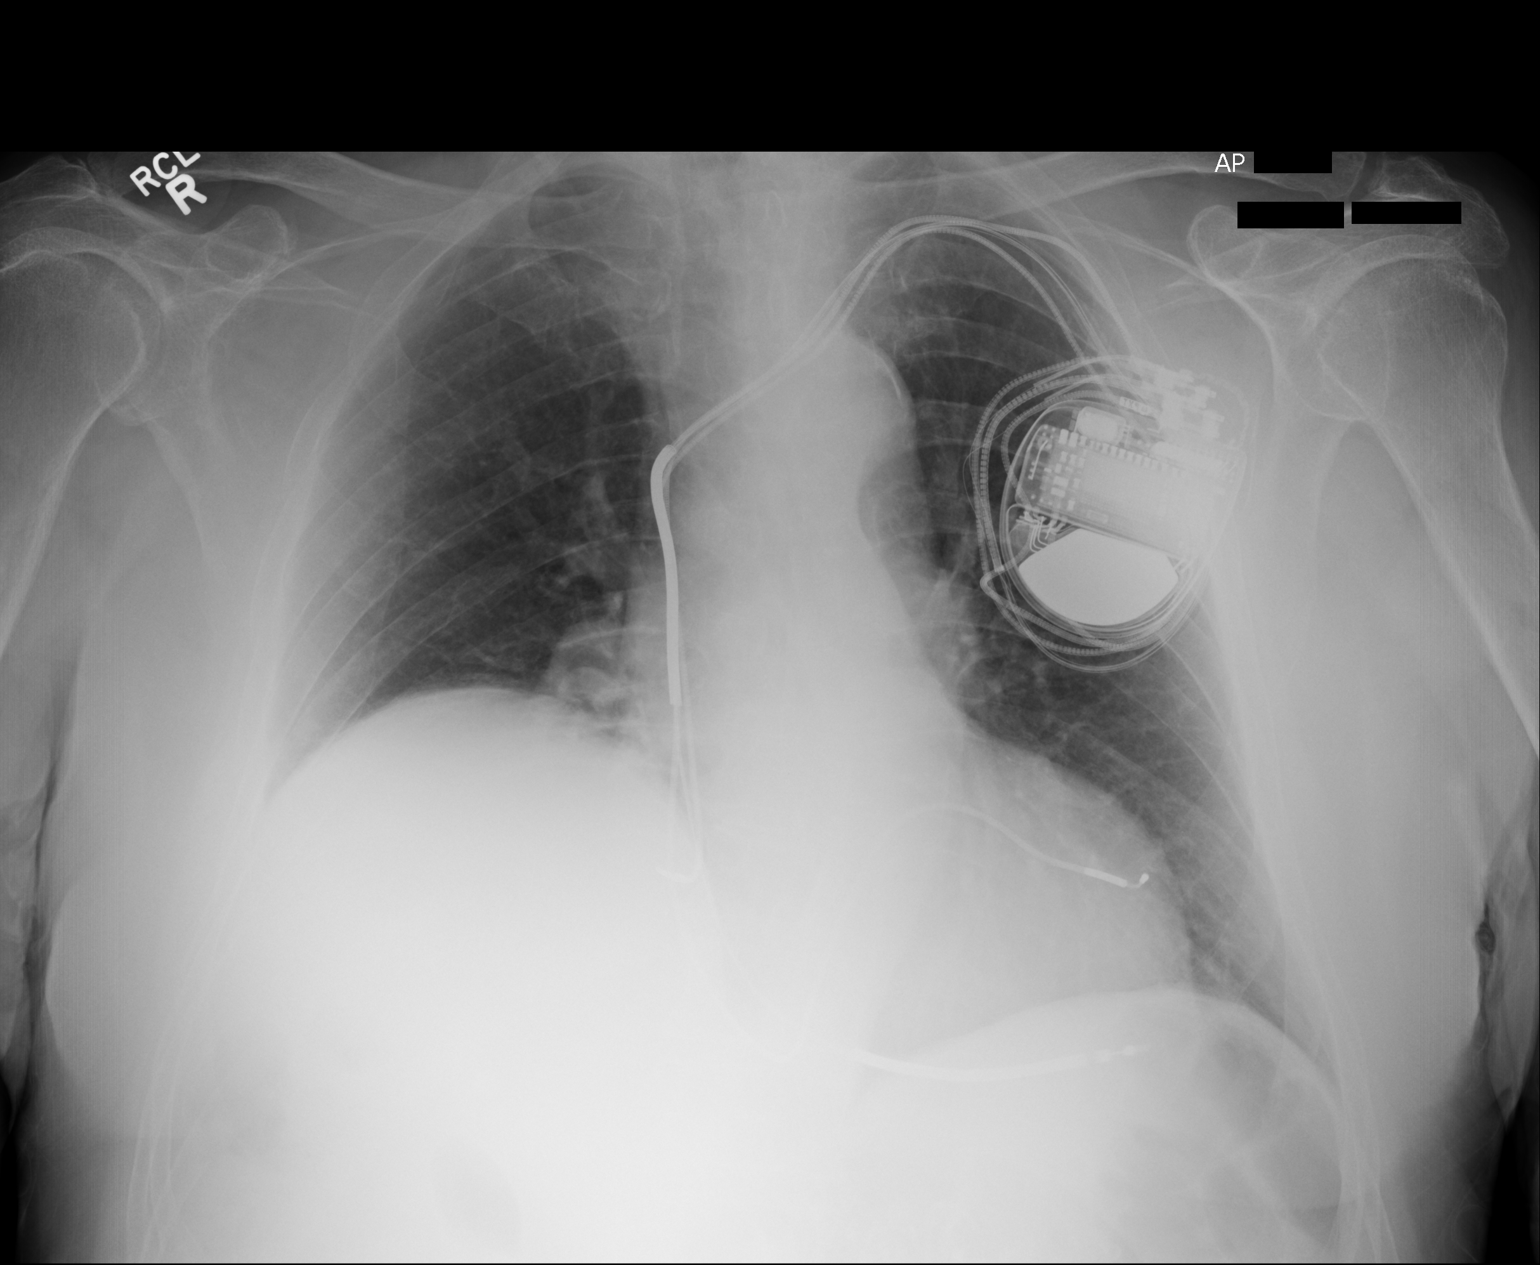

[1 of 1 positions shown; findings below may reference images not displayed]

FINDINGS: The cardiomediastinal silhouette is unremarkable.

A left-sided pacemaker/ ICD again noted.

Elevation of the right hemidiaphragm is unchanged.

There is no evidence of focal airspace disease, pulmonary edema,
suspicious pulmonary nodule/mass, pleural effusion, or pneumothorax.

No acute bony abnormalities are identified.
IMPRESSION: No active disease.

## 2017-12-28 ENCOUNTER — Encounter

## 2017-12-28 ENCOUNTER — Ambulatory Visit: Payer: Medicare HMO | Admitting: Urology

## 2017-12-28 ENCOUNTER — Encounter: Payer: Self-pay | Admitting: Urology

## 2018-01-04 ENCOUNTER — Ambulatory Visit: Payer: Medicare HMO | Admitting: Urology

## 2018-01-04 DIAGNOSIS — D696 Thrombocytopenia, unspecified: Secondary | ICD-10-CM | POA: Insufficient documentation

## 2018-02-01 ENCOUNTER — Other Ambulatory Visit: Payer: Self-pay

## 2018-02-01 ENCOUNTER — Encounter: Payer: Self-pay | Admitting: Urology

## 2018-02-01 ENCOUNTER — Telehealth: Payer: Self-pay | Admitting: Radiology

## 2018-02-01 ENCOUNTER — Ambulatory Visit
Admission: RE | Admit: 2018-02-01 | Discharge: 2018-02-01 | Disposition: A | Discharge status: Home / Self Care | Payer: Medicare HMO | Source: Ambulatory Visit | Attending: Urology | Admitting: Urology

## 2018-02-01 ENCOUNTER — Other Ambulatory Visit: Payer: Self-pay | Admitting: Radiology

## 2018-02-01 ENCOUNTER — Ambulatory Visit
Admission: RE | Admit: 2018-02-01 | Discharge: 2018-02-01 | Disposition: A | Discharge status: Home / Self Care | Payer: Medicare HMO | Attending: Urology | Admitting: Urology

## 2018-02-01 ENCOUNTER — Ambulatory Visit: Payer: Medicare HMO | Admitting: Urology

## 2018-02-01 VITALS — BP 149/78 | HR 89 | Ht 70.0 in | Wt 211.1 lb

## 2018-02-01 DIAGNOSIS — N200 Calculus of kidney: Secondary | ICD-10-CM

## 2018-02-01 DIAGNOSIS — I700 Atherosclerosis of aorta: Secondary | ICD-10-CM | POA: Diagnosis not present

## 2018-02-01 DIAGNOSIS — R339 Retention of urine, unspecified: Secondary | ICD-10-CM | POA: Diagnosis not present

## 2018-02-01 DIAGNOSIS — R300 Dysuria: Secondary | ICD-10-CM | POA: Diagnosis not present

## 2018-02-01 DIAGNOSIS — N210 Calculus in bladder: Secondary | ICD-10-CM

## 2018-02-01 DIAGNOSIS — N401 Enlarged prostate with lower urinary tract symptoms: Secondary | ICD-10-CM

## 2018-02-01 LAB — MICROSCOPIC EXAMINATION

## 2018-02-01 LAB — URINALYSIS, COMPLETE
BILIRUBIN UA: NEGATIVE
GLUCOSE, UA: NEGATIVE
KETONES UA: NEGATIVE
Nitrite, UA: NEGATIVE
Protein, UA: NEGATIVE
Urobilinogen, Ur: 0.2 mg/dL (ref 0.2–1.0)
pH, UA: 5.5 (ref 5.0–7.5)

## 2018-02-01 LAB — BLADDER SCAN AMB NON-IMAGING

## 2018-02-01 NOTE — Progress Notes (Signed)
02/01/2018 11:43 AM   Rolene Course 1930-10-07 [id number]  Referring provider: Tracie Harrier, MD [address] [place], [place] [address]  CC: Dysuria  HPI: I saw Mr. Earleen Newport in urology clinic in consultation for dysuria.  He was previously followed by Dr. Jacqlyn Larsen at [facility].  He is an 85 year old male with a number of co-morbidities including CAD, CHF, CKD, and long history of nephrolithiasis.  His only anticoagulation is full dose aspirin.  He presents with 5 to 10 days of severe dysuria with urination and intermittent stream.  He feels this is worse when he standing, and he is asymptomatic when laying down.  He denies any gross hematuria, urgency, or frequency.  He has a chronically weak stream.  He has a history of a cystolitholapaxy with Dr. Jacqlyn Larsen in 2014, an outlet procedure was not performed at that time.  He also presented with left-sided flank pain in 2018 and a CT demonstrated a nearly 1 cm stone at the UVJ.  When he was seen in urology followed by Dr. Louis Meckel he had had complete resolution of his pain, however no further up imaging was performed.  The patient does not think he ever passed that stone from his urethra.  He denies any flank pain.  PSA in August 2018 was 1.1.  Prostate measures 47 g on CT from August 2018.   PMH: Past Medical History:  Diagnosis Date  . CAD (coronary artery disease)    Nonobstructive cardiac disease by cath  . CHF (congestive heart failure) (HCC)    nonischemic cardiomyopathy  . CKD (chronic kidney disease)   . Hyperlipidemia   . Hypertension   . Ventricular tachycardia [facility])    s/p ICD    Surgical History: Past Surgical History:  Procedure Laterality Date  . CARDIAC CATHETERIZATION    . CARDIAC DEFIBRILLATOR PLACEMENT    . ELBOW SURGERY    . INGUINAL HERNIA REPAIR    . LITHOTRIPSY    . ORIF left wrist fracture    . ORIF PATELLA Left 01/10/2015   Procedure: OPEN REDUCTION INTERNAL (ORIF) FIXATION PATELLA;   Surgeon: Dereck Leep, MD;  Location: [facility] ORS;  Service: Orthopedics;  Laterality: Left;  . ORIF PATELLA Left 02/06/2015   Procedure: OPEN REDUCTION INTERNAL (ORIF) FIXATION PATELLA;  Surgeon: Dereck Leep, MD;  Location: [facility] ORS;  Service: Orthopedics;  Laterality: Left;  . PACEMAKER INSERTION      Allergies: No Known Allergies  Family History: Family History  Problem Relation Age of Onset  . CAD Mother   . Kidney cancer Father   . Prostate cancer Neg Hx   . Bladder Cancer Neg Hx     Social History:  reports that he has never smoked. He has never used smokeless tobacco. He reports that he does not drink alcohol or use drugs.  ROS: Please see flowsheet from today's date for complete review of systems.  Physical Exam: BP (!) 149/78 (BP Location: Left Arm, Patient Position: Sitting, Cuff Size: Large)   Pulse 89   Ht 5' 10"  (1.778 m)   Wt 211 lb 1.6 oz (95.8 kg)   BMI 30.29 kg/m    Constitutional:  Alert and oriented, No acute distress. Cardiovascular: Regular rate and rhythm Respiratory: Clear to auscultation bilaterally GI: Abdomen is soft, nontender, nondistended, no abdominal masses GU: No CVA tenderness, phallus without lesions, widely patent meatus, no urethral stone palpable distally Lymph: No cervical or inguinal lymphadenopathy. Skin: No rashes, bruises or suspicious lesions.  Neurologic: Grossly intact, no focal deficits, moving all 4 extremities. Psychiatric: Normal mood and affect.  Laboratory Data: Creatinine 1.7, eGFR 38 Urinalysis today 0-5 WBCs, 3-10 RBCs, no epithelial cells, moderate bacteria, nitrite negative  Pertinent Imaging: KUB today shows 2cm midline bladder stone, no urethral stones noted  Assessment & Plan:   In summary, the patient is an 85 year old male with history of cystolitholapaxy in 2014 by Dr. Jacqlyn Larsen with ongoing weak urinary stream, and now pain with urination over the last 10 days.  KUB shows recurrence of a 2cm bladder stone,  likely the cause of his symptoms.  We long discussion today about management options including cystolitholapaxy with or without TURP.  He has a moderate size 47 g gland on recent cross-sectional imaging.  I do feel that even with his comorbidities the fact that he has had 2 recurrences of bladder stones in the last 5 years warrants a simultaneous outlet procedure.  We discussed the risks and benefits at length including bleeding, infection, need for additional procedures, and temporary urgency and urge incontinence.  Schedule cystolitholapaxy with TURP Needs cardiac clearance, will need to hold full dose aspirin  Billey Co, MD  [facility] [address], [place], [place] [address] [phone]

## 2018-02-01 NOTE — Telephone Encounter (Signed)
Patient was given the [facility] Surgery Information form below as well as the Instructions for Pre-Admission Testing form & a map of [facility].   [address], [address] [place], [place] [address] Telephone: [phone] Fax: [phone]   Thank you for choosing [facility] for your upcoming surgery!  We are always here to assist in your urological needs.  Please read the following information with specific details for your upcoming appointments related to your surgery. Please contact Datrell Dunton at [phone] Option 3 with any questions.  The Name of Your Surgery: cystolitholapaxy, transurethral resection of prostate Your Surgery Date: 02/16/2018 Your Surgeon: Legrand Rams  Please call Same Day Surgery at [phone] between the hours of 1pm-3pm one day prior to your surgery. They will inform you of the time to arrive at Same Day Surgery which is located on the second floor of the [facility].   Please refer to the attached letter regarding instructions for Pre-Admission Testing. You will receive a call from the Pre-Admission Testing office regarding your appointment with them.  The Pre-Admission Testing office is located at [address] [facility], on the first floor of the [facility] in [address] (office is to the right as you enter through the [employer] of the [employer]). Please have all medications you are currently taking and your insurance card available.   Patient was advised to have nothing to eat or drink after midnight the night prior to surgery except that he may have only water until 2 hours before surgery with nothing to drink within 2 hours of surgery.  The patient states he currently takes aspirin 325 mg & was informed to hold medication for 7 days prior to surgery beginning on 02/09/2018. Patient's questions were answered and he  expressed understanding of these instructions.

## 2018-02-02 NOTE — Telephone Encounter (Signed)
Advised patient to hold aspirin 325mg  beginning 02/09/2018 which is 7 days prior to surgery. Also advised patient to continue amiodarone & metoprolol as it is prescribed including the day of surgery with a sip of water. Questions answered. Patient expresses understanding of instructions.

## 2018-02-04 LAB — CULTURE, URINE COMPREHENSIVE

## 2018-02-05 ENCOUNTER — Other Ambulatory Visit: Payer: Self-pay | Admitting: Radiology

## 2018-02-09 ENCOUNTER — Inpatient Hospital Stay: Admission: RE | Admit: 2018-02-09 | Payer: Medicare HMO | Source: Ambulatory Visit

## 2018-02-15 ENCOUNTER — Inpatient Hospital Stay: Admission: RE | Admit: 2018-02-15 | Payer: Medicare HMO | Source: Ambulatory Visit

## 2018-02-19 ENCOUNTER — Other Ambulatory Visit: Payer: Self-pay

## 2018-02-19 ENCOUNTER — Encounter
Admission: RE | Admit: 2018-02-19 | Discharge: 2018-02-19 | Disposition: A | Discharge status: Home / Self Care | Payer: Medicare HMO | Source: Ambulatory Visit | Attending: Urology | Admitting: Urology

## 2018-02-19 DIAGNOSIS — I252 Old myocardial infarction: Secondary | ICD-10-CM | POA: Diagnosis not present

## 2018-02-19 DIAGNOSIS — I110 Hypertensive heart disease with heart failure: Secondary | ICD-10-CM | POA: Diagnosis not present

## 2018-02-19 DIAGNOSIS — N401 Enlarged prostate with lower urinary tract symptoms: Secondary | ICD-10-CM | POA: Diagnosis not present

## 2018-02-19 DIAGNOSIS — N210 Calculus in bladder: Secondary | ICD-10-CM | POA: Diagnosis not present

## 2018-02-19 DIAGNOSIS — I2510 Atherosclerotic heart disease of native coronary artery without angina pectoris: Secondary | ICD-10-CM | POA: Insufficient documentation

## 2018-02-19 DIAGNOSIS — I10 Essential (primary) hypertension: Secondary | ICD-10-CM

## 2018-02-19 DIAGNOSIS — R410 Disorientation, unspecified: Secondary | ICD-10-CM | POA: Diagnosis not present

## 2018-02-19 DIAGNOSIS — R9431 Abnormal electrocardiogram [ECG] [EKG]: Secondary | ICD-10-CM

## 2018-02-19 DIAGNOSIS — I509 Heart failure, unspecified: Secondary | ICD-10-CM | POA: Diagnosis not present

## 2018-02-19 DIAGNOSIS — N323 Diverticulum of bladder: Secondary | ICD-10-CM | POA: Diagnosis not present

## 2018-02-19 DIAGNOSIS — Z79899 Other long term (current) drug therapy: Secondary | ICD-10-CM | POA: Diagnosis not present

## 2018-02-19 DIAGNOSIS — N138 Other obstructive and reflux uropathy: Secondary | ICD-10-CM | POA: Diagnosis not present

## 2018-02-19 DIAGNOSIS — Z01818 Encounter for other preprocedural examination: Secondary | ICD-10-CM

## 2018-02-19 DIAGNOSIS — R451 Restlessness and agitation: Secondary | ICD-10-CM | POA: Diagnosis not present

## 2018-02-19 DIAGNOSIS — Z95810 Presence of automatic (implantable) cardiac defibrillator: Secondary | ICD-10-CM | POA: Diagnosis not present

## 2018-02-19 DIAGNOSIS — I472 Ventricular tachycardia: Secondary | ICD-10-CM | POA: Diagnosis not present

## 2018-02-19 DIAGNOSIS — I422 Other hypertrophic cardiomyopathy: Secondary | ICD-10-CM | POA: Diagnosis not present

## 2018-02-19 HISTORY — DX: Unilateral inguinal hernia, without obstruction or gangrene, not specified as recurrent: K40.90

## 2018-02-19 HISTORY — DX: Personal history of urinary calculi: Z87.442

## 2018-02-19 LAB — BASIC METABOLIC PANEL
ANION GAP: 7 (ref 5–15)
BUN: 17 mg/dL (ref 8–23)
CO2: 31 mmol/L (ref 22–32)
Calcium: 9.2 mg/dL (ref 8.9–10.3)
Chloride: 107 mmol/L (ref 98–111)
Creatinine, Ser: 1.34 mg/dL — ABNORMAL HIGH (ref 0.61–1.24)
GFR calc non Af Amer: 48 mL/min — ABNORMAL LOW (ref >60)
GFR, EST AFRICAN AMERICAN: 56 mL/min — AB (ref >60)
Glucose, Bld: 100 mg/dL — ABNORMAL HIGH (ref 70–99)
POTASSIUM: 3.9 mmol/L (ref 3.5–5.1)
Sodium: 145 mmol/L (ref 135–145)

## 2018-02-19 LAB — CBC
HCT: 45.7 % (ref 39.0–52.0)
HEMOGLOBIN: 14.5 g/dL (ref 13.0–17.0)
MCH: 29.7 pg (ref 26.0–34.0)
MCHC: 31.7 g/dL (ref 30.0–36.0)
MCV: 93.5 fL (ref 80.0–100.0)
Platelets: 121 10*3/uL — ABNORMAL LOW (ref 150–400)
RBC: 4.89 MIL/uL (ref 4.22–5.81)
RDW: 13.5 % (ref 11.5–15.5)
WBC: 5.2 10*3/uL (ref 4.0–10.5)
nRBC: 0 % (ref 0.0–0.2)

## 2018-02-19 NOTE — Patient Instructions (Signed)
Your procedure is scheduled on: Wednesday, February 21, 2018 Report to Day Surgery on the 2nd floor of the [facility] To find out your arrival time, please call [phone] between 1PM - 3PM on: Tuesday, February 20, 2018  REMEMBER: Instructions that are not followed completely may result in serious medical risk, up to and including death; or upon the discretion of your surgeon and anesthesiologist your surgery may need to be rescheduled.  Do not eat food after midnight the night before surgery.  No gum chewing, lozengers or hard candies.  You may however, drink CLEAR liquids up to 2 hours before you are scheduled to arrive for your surgery. Do not drink anything within 2 hours of the start of your surgery.  Clear liquids include: - water  - apple juice without pulp - gatorade - black coffee or tea (Do NOT add milk or creamers to the coffee or tea) Do NOT drink anything that is not on this list.  No Alcohol for 24 hours before or after surgery.  No Smoking including e-cigarettes for 24 hours prior to surgery.  No chewable tobacco products for at least 6 hours prior to surgery.  No nicotine patches on the day of surgery.  On the morning of surgery brush your teeth with toothpaste and water, you may rinse your mouth with mouthwash if you wish. Do not swallow any toothpaste or mouthwash.  Notify your doctor if there is any change in your medical condition (cold, fever, infection).  Do not wear jewelry, make-up, hairpins, clips or nail polish.  Do not wear lotions, powders, or perfumes.   Contacts and dentures may not be worn into surgery.  Do not bring valuables to the hospital, including drivers license, insurance or credit cards.  [name] is not responsible for any belongings or valuables.   TAKE THESE MEDICATIONS THE MORNING OF SURGERY:  1.  Amiodarone 2.  Metoprolol   NOW!  Stop Anti-inflammatories (NSAIDS) such as Advil, Aleve, Ibuprofen, Motrin, Naproxen,  Naprosyn and Aspirin based products such as Excedrin, Goodys Powder, BC Powder. (May take Tylenol or Acetaminophen if needed.)  NOW!  Stop ANY OVER THE COUNTER supplements until after surgery.  Wear comfortable clothing (specific to your surgery type) to the hospital.  If you are being admitted to the hospital overnight, leave your suitcase in the car. After surgery it may be brought to your room.  If you are being discharged the day of surgery, you will not be allowed to drive home. You will need a responsible adult to drive you home and stay with you that night.   If you are taking public transportation, you will need to have a responsible adult with you. Please confirm with your physician that it is acceptable to use public transportation.   Please call [phone] if you have any questions about these instructions.

## 2018-02-20 ENCOUNTER — Other Ambulatory Visit: Payer: Self-pay | Admitting: Radiology

## 2018-02-20 MED ORDER — CIPROFLOXACIN IN D5W 400 MG/200ML IV SOLN
400.0000 mg | INTRAVENOUS | Status: AC
  Administered 2018-02-21: 400 mg via INTRAVENOUS

## 2018-02-20 NOTE — Pre-Procedure Instructions (Signed)
CARDIAC CLEARANCE AND PACEMAKER FORM ON CHART

## 2018-02-21 ENCOUNTER — Observation Stay
Admission: RE | Admit: 2018-02-21 | Discharge: 2018-02-22 | Disposition: A | Discharge status: Home / Self Care | Payer: Medicare HMO | Attending: Urology | Admitting: Urology

## 2018-02-21 ENCOUNTER — Ambulatory Visit: Payer: Medicare HMO | Admitting: Certified Registered Nurse Anesthetist

## 2018-02-21 ENCOUNTER — Other Ambulatory Visit: Payer: Self-pay

## 2018-02-21 ENCOUNTER — Encounter
Admission: RE | Disposition: A | Discharge status: Home / Self Care | Payer: Self-pay | Source: Home / Self Care | Attending: Urology

## 2018-02-21 ENCOUNTER — Encounter: Payer: Self-pay | Admitting: *Deleted

## 2018-02-21 DIAGNOSIS — N323 Diverticulum of bladder: Secondary | ICD-10-CM | POA: Insufficient documentation

## 2018-02-21 DIAGNOSIS — N138 Other obstructive and reflux uropathy: Secondary | ICD-10-CM | POA: Diagnosis present

## 2018-02-21 DIAGNOSIS — N320 Bladder-neck obstruction: Secondary | ICD-10-CM | POA: Diagnosis not present

## 2018-02-21 DIAGNOSIS — Z95810 Presence of automatic (implantable) cardiac defibrillator: Secondary | ICD-10-CM | POA: Insufficient documentation

## 2018-02-21 DIAGNOSIS — N401 Enlarged prostate with lower urinary tract symptoms: Secondary | ICD-10-CM

## 2018-02-21 DIAGNOSIS — R451 Restlessness and agitation: Secondary | ICD-10-CM | POA: Insufficient documentation

## 2018-02-21 DIAGNOSIS — I509 Heart failure, unspecified: Secondary | ICD-10-CM | POA: Insufficient documentation

## 2018-02-21 DIAGNOSIS — I252 Old myocardial infarction: Secondary | ICD-10-CM | POA: Insufficient documentation

## 2018-02-21 DIAGNOSIS — N210 Calculus in bladder: Principal | ICD-10-CM | POA: Insufficient documentation

## 2018-02-21 DIAGNOSIS — R410 Disorientation, unspecified: Secondary | ICD-10-CM | POA: Insufficient documentation

## 2018-02-21 DIAGNOSIS — I2510 Atherosclerotic heart disease of native coronary artery without angina pectoris: Secondary | ICD-10-CM | POA: Insufficient documentation

## 2018-02-21 DIAGNOSIS — I422 Other hypertrophic cardiomyopathy: Secondary | ICD-10-CM | POA: Insufficient documentation

## 2018-02-21 DIAGNOSIS — I110 Hypertensive heart disease with heart failure: Secondary | ICD-10-CM | POA: Insufficient documentation

## 2018-02-21 DIAGNOSIS — Z79899 Other long term (current) drug therapy: Secondary | ICD-10-CM | POA: Insufficient documentation

## 2018-02-21 DIAGNOSIS — I472 Ventricular tachycardia: Secondary | ICD-10-CM | POA: Insufficient documentation

## 2018-02-21 HISTORY — PX: HOLMIUM LASER APPLICATION: SHX5852

## 2018-02-21 HISTORY — PX: CYSTOSCOPY WITH LITHOLAPAXY: SHX1425

## 2018-02-21 HISTORY — PX: TRANSURETHRAL RESECTION OF PROSTATE: SHX73

## 2018-02-21 LAB — CBC
HCT: 43 % (ref 39.0–52.0)
Hemoglobin: 13.9 g/dL (ref 13.0–17.0)
MCH: 29.6 pg (ref 26.0–34.0)
MCHC: 32.3 g/dL (ref 30.0–36.0)
MCV: 91.7 fL (ref 80.0–100.0)
NRBC: 0 % (ref 0.0–0.2)
Platelets: 106 10*3/uL — ABNORMAL LOW (ref 150–400)
RBC: 4.69 MIL/uL (ref 4.22–5.81)
RDW: 13.3 % (ref 11.5–15.5)
WBC: 3.6 10*3/uL — ABNORMAL LOW (ref 4.0–10.5)

## 2018-02-21 LAB — CREATININE, SERUM
Creatinine, Ser: 1.14 mg/dL (ref 0.61–1.24)
GFR calc Af Amer: >60 mL/min (ref >60)
GFR, EST NON AFRICAN AMERICAN: 58 mL/min — AB (ref >60)

## 2018-02-21 SURGERY — CYSTOSCOPY, WITH BLADDER CALCULUS LITHOLAPAXY
Anesthesia: General | Site: Prostate

## 2018-02-21 MED ORDER — ROCURONIUM BROMIDE 50 MG/5ML IV SOLN
INTRAVENOUS | Status: AC
  Filled 2018-02-21: qty 1

## 2018-02-21 MED ORDER — ACETAMINOPHEN 500 MG PO TABS
ORAL_TABLET | ORAL | Status: AC
  Filled 2018-02-21: qty 2

## 2018-02-21 MED ORDER — LIDOCAINE HCL (PF) 2 % IJ SOLN
INTRAMUSCULAR | Status: AC
  Filled 2018-02-21: qty 10

## 2018-02-21 MED ORDER — DEXAMETHASONE SODIUM PHOSPHATE 10 MG/ML IJ SOLN
INTRAMUSCULAR | Status: DC | PRN
  Administered 2018-02-21: 5 mg via INTRAVENOUS

## 2018-02-21 MED ORDER — SUCCINYLCHOLINE CHLORIDE 20 MG/ML IJ SOLN
INTRAMUSCULAR | Status: DC | PRN
  Administered 2018-02-21: 100 mg via INTRAVENOUS

## 2018-02-21 MED ORDER — ROCURONIUM BROMIDE 100 MG/10ML IV SOLN
INTRAVENOUS | Status: DC | PRN
  Administered 2018-02-21: 5 mg via INTRAVENOUS
  Administered 2018-02-21: 25 mg via INTRAVENOUS
  Administered 2018-02-21 (×2): 10 mg via INTRAVENOUS

## 2018-02-21 MED ORDER — FUROSEMIDE 20 MG PO TABS
20.0000 mg | ORAL_TABLET | Freq: Every day | ORAL | Status: DC
  Administered 2018-02-21 – 2018-02-22 (×2): 20 mg via ORAL
  Filled 2018-02-21 (×2): qty 1

## 2018-02-21 MED ORDER — BELLADONNA ALKALOIDS-OPIUM 16.2-60 MG RE SUPP
1.0000 | Freq: Four times a day (QID) | RECTAL | Status: DC | PRN
  Filled 2018-02-21: qty 1

## 2018-02-21 MED ORDER — PHENYLEPHRINE HCL 10 MG/ML IJ SOLN
INTRAMUSCULAR | Status: DC | PRN
  Administered 2018-02-21: 100 ug via INTRAVENOUS

## 2018-02-21 MED ORDER — PROPOFOL 10 MG/ML IV BOLUS
INTRAVENOUS | Status: AC
  Filled 2018-02-21: qty 20

## 2018-02-21 MED ORDER — SODIUM CHLORIDE 0.9% FLUSH: 3.0000 mL | Freq: Two times a day (BID) | INTRAVENOUS | Status: DC

## 2018-02-21 MED ORDER — HEPARIN SODIUM (PORCINE) 5000 UNIT/ML IJ SOLN
5000.0000 [IU] | Freq: Three times a day (TID) | INTRAMUSCULAR | Status: DC
  Administered 2018-02-21: 5000 [IU] via SUBCUTANEOUS
  Filled 2018-02-21: qty 1

## 2018-02-21 MED ORDER — AMIODARONE HCL 200 MG PO TABS
400.0000 mg | ORAL_TABLET | Freq: Every day | ORAL | Status: DC
  Administered 2018-02-22: 400 mg via ORAL
  Filled 2018-02-21: qty 2

## 2018-02-21 MED ORDER — METOPROLOL SUCCINATE ER 50 MG PO TB24
50.0000 mg | ORAL_TABLET | Freq: Every day | ORAL | Status: DC
  Administered 2018-02-22: 50 mg via ORAL
  Filled 2018-02-21: qty 1

## 2018-02-21 MED ORDER — HYDROMORPHONE HCL 1 MG/ML IJ SOLN: 0.5000 mg | INTRAMUSCULAR | Status: DC | PRN

## 2018-02-21 MED ORDER — FENTANYL CITRATE (PF) 100 MCG/2ML IJ SOLN
INTRAMUSCULAR | Status: AC
  Filled 2018-02-21: qty 2

## 2018-02-21 MED ORDER — ACETAMINOPHEN 500 MG PO TABS
1000.0000 mg | ORAL_TABLET | Freq: Four times a day (QID) | ORAL | Status: AC
  Administered 2018-02-21 – 2018-02-22 (×3): 1000 mg via ORAL
  Filled 2018-02-21 (×3): qty 2

## 2018-02-21 MED ORDER — SODIUM CHLORIDE 0.9 % IR SOLN
3000.0000 mL | Status: DC
  Administered 2018-02-21: 3000 mL

## 2018-02-21 MED ORDER — FENTANYL CITRATE (PF) 100 MCG/2ML IJ SOLN: 25.0000 ug | INTRAMUSCULAR | Status: DC | PRN

## 2018-02-21 MED ORDER — FAMOTIDINE 20 MG PO TABS
20.0000 mg | ORAL_TABLET | Freq: Once | ORAL | Status: AC
  Administered 2018-02-21: 20 mg via ORAL

## 2018-02-21 MED ORDER — BELLADONNA ALKALOIDS-OPIUM 16.2-60 MG RE SUPP: 1.0000 | Freq: Four times a day (QID) | RECTAL | Status: DC | PRN

## 2018-02-21 MED ORDER — ONDANSETRON HCL 4 MG/2ML IJ SOLN: 4.0000 mg | INTRAMUSCULAR | Status: DC | PRN

## 2018-02-21 MED ORDER — PROPOFOL 10 MG/ML IV BOLUS
INTRAVENOUS | Status: DC | PRN
  Administered 2018-02-21: 150 mg via INTRAVENOUS

## 2018-02-21 MED ORDER — CIPROFLOXACIN IN D5W 400 MG/200ML IV SOLN
INTRAVENOUS | Status: AC
  Filled 2018-02-21: qty 200

## 2018-02-21 MED ORDER — FENTANYL CITRATE (PF) 100 MCG/2ML IJ SOLN
INTRAMUSCULAR | Status: DC | PRN
  Administered 2018-02-21: 50 ug via INTRAVENOUS
  Administered 2018-02-21: 25 ug via INTRAVENOUS

## 2018-02-21 MED ORDER — LACTATED RINGERS IV SOLN
INTRAVENOUS | Status: DC
  Administered 2018-02-21: 10:00:00 via INTRAVENOUS

## 2018-02-21 MED ORDER — HYDROCODONE-ACETAMINOPHEN 5-325 MG PO TABS
1.0000 | ORAL_TABLET | ORAL | Status: DC | PRN
  Administered 2018-02-21: 2 via ORAL
  Filled 2018-02-21: qty 2

## 2018-02-21 MED ORDER — PHENYLEPHRINE HCL 10 MG/ML IJ SOLN
INTRAMUSCULAR | Status: AC
  Filled 2018-02-21: qty 1

## 2018-02-21 MED ORDER — MUPIROCIN 2 % EX OINT
1.0000 "application " | TOPICAL_OINTMENT | Freq: Two times a day (BID) | CUTANEOUS | Status: DC
  Administered 2018-02-21: 1 via NASAL
  Filled 2018-02-21: qty 22

## 2018-02-21 MED ORDER — SODIUM CHLORIDE 0.9% FLUSH: 3.0000 mL | INTRAVENOUS | Status: DC | PRN

## 2018-02-21 MED ORDER — SODIUM CHLORIDE 0.9 % IV SOLN: 250.0000 mL | INTRAVENOUS | Status: DC | PRN

## 2018-02-21 MED ORDER — SUCCINYLCHOLINE CHLORIDE 20 MG/ML IJ SOLN
INTRAMUSCULAR | Status: AC
  Filled 2018-02-21: qty 1

## 2018-02-21 MED ORDER — FAMOTIDINE 20 MG PO TABS
ORAL_TABLET | ORAL | Status: AC
  Administered 2018-02-21: 20 mg via ORAL
  Filled 2018-02-21: qty 1

## 2018-02-21 MED ORDER — ONDANSETRON HCL 4 MG/2ML IJ SOLN
INTRAMUSCULAR | Status: AC
  Filled 2018-02-21: qty 2

## 2018-02-21 MED ORDER — DEXAMETHASONE SODIUM PHOSPHATE 10 MG/ML IJ SOLN
INTRAMUSCULAR | Status: AC
  Filled 2018-02-21: qty 1

## 2018-02-21 MED ORDER — SUGAMMADEX SODIUM 200 MG/2ML IV SOLN
INTRAVENOUS | Status: DC | PRN
  Administered 2018-02-21: 200 mg via INTRAVENOUS

## 2018-02-21 MED ORDER — SEVOFLURANE IN SOLN
RESPIRATORY_TRACT | Status: AC
  Filled 2018-02-21: qty 250

## 2018-02-21 MED ORDER — LIDOCAINE HCL (CARDIAC) PF 100 MG/5ML IV SOSY
PREFILLED_SYRINGE | INTRAVENOUS | Status: DC | PRN
  Administered 2018-02-21: 50 mg via INTRAVENOUS

## 2018-02-21 MED ORDER — ONDANSETRON HCL 4 MG/2ML IJ SOLN: 4.0000 mg | Freq: Once | INTRAMUSCULAR | Status: DC | PRN

## 2018-02-21 SURGICAL SUPPLY — 30 items
ADAPTER IRRIG TUBE 2 SPIKE SOL (ADAPTER) ×4 IMPLANT
BAG DRAIN CYSTO-URO LG1000N (MISCELLANEOUS) ×4 IMPLANT
BAG URO DRAIN 4000ML (MISCELLANEOUS) ×4 IMPLANT
CATH FOLEY 3WAY 30CC 22FR (CATHETERS) IMPLANT
CATH FOLEY 3WAY 30CC 24FR (CATHETERS) ×2
CATH URTH STD 24FR FL 3W 2 (CATHETERS) IMPLANT
COVER WAND RF STERILE (DRAPES) ×2 IMPLANT
DRAPE UTILITY 15X26 TOWEL STRL (DRAPES) ×4 IMPLANT
ELECT BIVAP BIPO 22/24 DONUT (ELECTROSURGICAL) ×4
ELECT LOOP 22F BIPOLAR SML (ELECTROSURGICAL)
ELECTRD BIVAP BIPO 22/24 DONUT (ELECTROSURGICAL) IMPLANT
ELECTRODE LOOP 22F BIPOLAR SML (ELECTROSURGICAL) IMPLANT
GLOVE BIOGEL PI IND STRL 7.5 (GLOVE) ×2 IMPLANT
GLOVE BIOGEL PI INDICATOR 7.5 (GLOVE) ×2
GOWN STRL REUS W/ TWL LRG LVL3 (GOWN DISPOSABLE) ×2 IMPLANT
GOWN STRL REUS W/ TWL XL LVL3 (GOWN DISPOSABLE) ×2 IMPLANT
GOWN STRL REUS W/TWL LRG LVL3 (GOWN DISPOSABLE) ×2
GOWN STRL REUS W/TWL XL LVL3 (GOWN DISPOSABLE) ×2
HOLDER FOLEY CATH W/STRAP (MISCELLANEOUS) ×6 IMPLANT
HOLMIUM LASER 550 FIBER (Laser) ×2 IMPLANT
KIT TURNOVER CYSTO (KITS) ×4 IMPLANT
LOOP CUT BIPOLAR 24F LRG (ELECTROSURGICAL) ×2 IMPLANT
PACK CYSTO AR (MISCELLANEOUS) ×4 IMPLANT
SET CYSTO W/LG BORE CLAMP LF (SET/KITS/TRAYS/PACK) ×2 IMPLANT
SET IRRIG Y TYPE TUR BLADDER L (SET/KITS/TRAYS/PACK) ×4 IMPLANT
SOL .9 NS 3000ML IRR  AL (IV SOLUTION) ×12
SOL .9 NS 3000ML IRR UROMATIC (IV SOLUTION) ×12 IMPLANT
SYRINGE IRR TOOMEY STRL 70CC (SYRINGE) ×4 IMPLANT
WATER STERILE IRR 1000ML POUR (IV SOLUTION) ×4 IMPLANT
WATER STERILE IRR 3000ML UROMA (IV SOLUTION) ×4 IMPLANT

## 2018-02-21 NOTE — Anesthesia Postprocedure Evaluation (Signed)
Anesthesia Post Note  Patient: Spencer Coleman  Procedure(s) Performed: CYSTOSCOPY WITH LITHOLAPAXY (N/A Bladder) HOLMIUM LASER APPLICATION (N/A Bladder) TRANSURETHRAL RESECTION OF THE PROSTATE (TURP) (N/A Prostate)  Patient location during evaluation: PACU Anesthesia Type: General Level of consciousness: awake and alert Pain management: pain level controlled Vital Signs Assessment: post-procedure vital signs reviewed and stable Respiratory status: spontaneous breathing, nonlabored ventilation, respiratory function stable and patient connected to nasal cannula oxygen Cardiovascular status: blood pressure returned to baseline and stable Postop Assessment: no apparent nausea or vomiting Anesthetic complications: no     Last Vitals:  Vitals:   02/21/18 1439 02/21/18 1501  BP: (!) 141/97 (!) 168/84  Pulse: 60 63  Resp: (!) 21 18  Temp: (!) 36.2 C (!) 36.3 C  SpO2: 95% 96%    Last Pain:  Vitals:   02/21/18 1501  TempSrc: Oral  PainSc:                  Lenard Simmer

## 2018-02-21 NOTE — Anesthesia Preprocedure Evaluation (Addendum)
Anesthesia Evaluation  Patient identified by MRN, date of birth, ID band Patient awake    Reviewed: Allergy & Precautions, H&P , NPO status , Patient's Chart, lab work & pertinent test results, reviewed documented beta blocker date and time   History of Anesthesia Complications Negative for: history of anesthetic complications  Airway Mallampati: III  TM Distance: >3 FB Neck ROM: full    Dental no notable dental hx. (+) Edentulous Upper, Edentulous Lower, Dental Advidsory Given   Pulmonary neg pulmonary ROS,    Pulmonary exam normal breath sounds clear to auscultation       Cardiovascular Exercise Tolerance: Good hypertension, On Medications and On Home Beta Blockers (-) angina+ CAD, + Past MI and +CHF  (-) Cardiac Stents and (-) CABG Normal cardiovascular exam+ dysrhythmias Ventricular Tachycardia + pacemaker + Cardiac Defibrillator (-) Valvular Problems/Murmurs Rhythm:regular Rate:Normal     Neuro/Psych negative neurological ROS  negative psych ROS   GI/Hepatic negative GI ROS, Neg liver ROS,   Endo/Other  negative endocrine ROS  Renal/GU CRFRenal disease  negative genitourinary   Musculoskeletal   Abdominal   Peds  Hematology negative hematology ROS (+)   Anesthesia Other Findings Past Medical History:   CHF (congestive heart failure) (HCC)                           Comment:nonischemic cardiomyopathy   CKD (chronic kidney disease)                                 CAD (coronary artery disease)                                  Comment:Nonobstructive cardiac disease by cath   Ventricular tachycardia (HCC)                                  Comment:s/p ICD   Hypertension                                                 Hyperlipidemia                                              Patient cleared for surgery by both medicine and cardiology, please see notes.  Reproductive/Obstetrics negative OB ROS                              Anesthesia Physical  Anesthesia Plan  ASA: IV  Anesthesia Plan: General   Post-op Pain Management:    Induction: Intravenous  PONV Risk Score and Plan: 2 and Ondansetron, Dexamethasone and Treatment may vary due to age or medical condition  Airway Management Planned: Oral ETT  Additional Equipment:   Intra-op Plan:   Post-operative Plan: Extubation in OR  Informed Consent: I have reviewed the patients History and Physical, chart, labs and discussed the procedure including the risks, benefits and alternatives for the proposed anesthesia with the patient or authorized representative who has  indicated his/her understanding and acceptance.     Dental Advisory Given  Plan Discussed with: Anesthesiologist, CRNA and Surgeon  Anesthesia Plan Comments:        Anesthesia Quick Evaluation

## 2018-02-21 NOTE — Anesthesia Post-op Follow-up Note (Signed)
Anesthesia QCDR form completed.        

## 2018-02-21 NOTE — Transfer of Care (Signed)
Immediate Anesthesia Transfer of Care Note  Patient: Spencer Coleman  Procedure(s) Performed: CYSTOSCOPY WITH LITHOLAPAXY (N/A Bladder) HOLMIUM LASER APPLICATION (N/A Bladder) TRANSURETHRAL RESECTION OF THE PROSTATE (TURP) (N/A Prostate)  Patient Location: PACU  Anesthesia Type:General  Level of Consciousness: awake, alert , oriented and patient cooperative  Airway & Oxygen Therapy: Patient Spontanous Breathing and Patient connected to face mask oxygen  Post-op Assessment: Report given to RN and Post -op Vital signs reviewed and stable  Post vital signs: Reviewed and stable  Last Vitals:  Vitals Value Taken Time  BP 162/91 02/21/2018  1:12 PM  Temp 36 C 02/21/2018  1:12 PM  Pulse 59 02/21/2018  1:14 PM  Resp 11 02/21/2018  1:14 PM  SpO2 100 % 02/21/2018  1:14 PM  Vitals shown include unvalidated device data.  Last Pain:  Vitals:   02/21/18 0933  TempSrc: Temporal  PainSc: 0-No pain         Complications: No apparent anesthesia complications

## 2018-02-21 NOTE — Op Note (Signed)
Date of procedure: 02/21/18  Preoperative diagnosis:  1. Bladder stone, BPH with urinary obstruction  Postoperative diagnosis:  1. Same  Procedure: 1. Cystolitholapaxy stone 2.5 cm, TURP  Surgeon: Legrand Rams, MD  Anesthesia: General  Complications: None  Intraoperative findings:  1.  Moderate size prostate with high bladder neck, multiple bladder diverticula and severe trabeculations 2.  Uncomplicated lithotripsy and evacuation of 2.5 cm bladder stone 3.  Uncomplicated bipolar TURP, ureteral orifices and verumontanum intact at conclusion of procedure  EBL: 25 cc  Specimens: Prostate chips for permanent  Drains: 24 French three-way Foley, 40 cc in balloon  Indication: Spencer Coleman is a 86 y.o. patient with history of bladder stone and cystolitholapaxy in 2014 with Dr. Achilles Dunk who presented with intermittent stream, dysuria, and difficulty voiding with elevated postvoid residual.  KUB showed a 2 to 3 cm bladder stone. After reviewing the management options for treatment, they elected to proceed with the above surgical procedure(s). We have discussed the potential benefits and risks of the procedure, side effects of the proposed treatment, the likelihood of the patient achieving the goals of the procedure, and any potential problems that might occur during the procedure or recuperation. Informed consent has been obtained.  Description of procedure:  The patient was taken to the operating room and general anesthesia was induced.  The patient was placed in the dorsal lithotomy position, prepped and draped in the usual sterile fashion, and preoperative antibiotics(Cipro) were administered. A preoperative time-out was performed.   [name] sounds were used to dilate the urethra to 30 [place].  A 23 French rigid cystoscope was used to intubate the urethra and a slightly narrowed urethra was followed proximally into the bladder.  The prostate was moderate in size with a high bladder neck.   The ureteral orifices were orthotopic bilaterally.  There was a large 2.5 cm yellow bladder stone immediately visualized.  There were multiple bladder diverticula, and severe bladder trabeculations.  The 500 m laser fiber was used to fragment the bladder stone on settings of 1.5 J and 15 Hz, and the pieces were evacuated free using a Toomey syringe.  All stone fragments were removed.  There was no bladder damage or bleeding.  A visual obturator was then used to pass a 26 [place] resectoscope.  There was lateral lobe hypertrophy, but no median lobe.  The large resecting loop was used to lower the bladder neck and methodically resect the prostate circumferentially down to the capsule.  All chips were evacuated from the bladder and sent for pathology.  A bipolar button was then used to obtain meticulous hemostasis under low flow conditions.  A 24 French three-way catheter passed easily into the bladder with return of clear fluid, and 40 cc were placed in the balloon.  The catheter irrigated easily with normal saline and remained clear.  CBI was initiated.  Disposition: Stable to PACU  Plan: Wean CBI overnight Foley removal and voiding trial tomorrow morning  Legrand Rams, MD

## 2018-02-21 NOTE — H&P (Signed)
UROLOGY H&P UPDATE  Agree with prior H&P dated 02/01/2018.  85 year old male with difficulty voiding, dysuria, and intermittent obstruction from a 2 to 3 cm bladder stone in the setting of BPH with a 50 g prostate.  Cardiac: RRR Lungs: CTA bilaterally  Laterality: N/A Procedure: Cystolitholopaxy and TURP  Urine: Urine culture 02/01/2018 no growth  Informed consent obtained, we specifically discussed the risks of bleeding, infection, post-operative pain, need for additional procedures, need for temporary Foley placement, temporary urgency and urge incontinence, and dysuria.  Sondra Come, MD 02/21/2018

## 2018-02-21 NOTE — Care Management Obs Status (Signed)
MEDICARE OBSERVATION STATUS NOTIFICATION   Patient Details  Name: Spencer Coleman MRN: [id number] Date of Birth: 04-05-1930   Medicare Observation Status Notification Given:  Yes    Kazzandra Desaulniers A Trinka Keshishyan, RN 02/21/2018, 3:49 PM

## 2018-02-21 NOTE — Anesthesia Procedure Notes (Signed)
Procedure Name: Intubation Date/Time: 02/21/2018 11:26 AM Performed by: Mathews Argyle, CRNA Pre-anesthesia Checklist: Patient identified, Emergency Drugs available, Suction available and Patient being monitored Patient Re-evaluated:Patient Re-evaluated prior to induction Oxygen Delivery Method: Circle system utilized Preoxygenation: Pre-oxygenation with 100% oxygen Induction Type: IV induction Ventilation: Mask ventilation without difficulty Laryngoscope Size: Miller and 2 Grade View: Grade I Tube type: Oral Tube size: 7.5 mm Number of attempts: 1 Airway Equipment and Method: Stylet Placement Confirmation: ETT inserted through vocal cords under direct vision,  positive ETCO2 and breath sounds checked- equal and bilateral Secured at: 22 cm Tube secured with: Tape Dental Injury: Teeth and Oropharynx as per pre-operative assessment

## 2018-02-22 DIAGNOSIS — N210 Calculus in bladder: Secondary | ICD-10-CM | POA: Diagnosis not present

## 2018-02-22 LAB — SURGICAL PATHOLOGY

## 2018-02-22 MED ORDER — LORAZEPAM 0.5 MG PO TABS: 0.5000 mg | ORAL_TABLET | Freq: Once | ORAL | Status: DC

## 2018-02-22 MED ORDER — LORAZEPAM 2 MG/ML IJ SOLN: 1.0000 mg | Freq: Once | INTRAMUSCULAR | Status: AC

## 2018-02-22 MED ORDER — HALOPERIDOL LACTATE 5 MG/ML IJ SOLN
1.0000 mg | Freq: Once | INTRAMUSCULAR | Status: AC
  Administered 2018-02-22: 1 mg via INTRAMUSCULAR
  Filled 2018-02-22: qty 0.2

## 2018-02-22 MED ORDER — LORAZEPAM 2 MG/ML IJ SOLN: 1.0000 mg | Freq: Once | INTRAMUSCULAR | Status: DC

## 2018-02-22 MED ORDER — LORAZEPAM 1 MG PO TABS
1.0000 mg | ORAL_TABLET | Freq: Once | ORAL | Status: AC
  Administered 2018-02-22: 1 mg via ORAL
  Filled 2018-02-22: qty 1

## 2018-02-22 MED ORDER — DIPHENHYDRAMINE HCL 25 MG PO CAPS
25.0000 mg | ORAL_CAPSULE | Freq: Once | ORAL | Status: AC
  Administered 2018-02-22: 25 mg via ORAL
  Filled 2018-02-22: qty 1

## 2018-02-22 MED ORDER — ACETAMINOPHEN 500 MG PO TABS: 1000.0000 mg | ORAL_TABLET | Freq: Four times a day (QID) | ORAL | 0 refills | Status: DC

## 2018-02-22 NOTE — Progress Notes (Signed)
Pt became more confused and agitated. Code 300 was called on pt when pt would not listen to Staff and stay in the bed, became verbally abusive,  along with demanding to call police. Primary nurse paged and spoke to Dr. Richardo Hanks. Orders received for 1 time dose of ativan. Primary nurse to continue to monitor.

## 2018-02-22 NOTE — Progress Notes (Signed)
Patient cleared for discharge. Dr Richardo Hanks satisfied with PVR. Brother Daryl in room with patient and ready to take him home. Patient will return to home via POV. Belongings gathered, instructions given. Patient still a little confused at the moment but his brother understands that patient needs to drink plenty of fluids and understands reasons to call the office or visit the ED. Patients son will be staying with him and will be able to help monitor urine output.

## 2018-02-22 NOTE — Discharge Instructions (Signed)
-  Drink plenty of fluids -Call office or visit Emergency Room if:          *Fever >or =101, chills, visual disturbances         *Uncontrollable pain         *Inability to void

## 2018-02-22 NOTE — Progress Notes (Signed)
Pt more confused and more agitated. Pulling at foley catheter. Refusing to obey commands from Nursing staff. Code 300 called on pt. Primary nurse paged and spoke to Dr. Richardo HanksSninsky. Orders received for Benadryl 25 mg PO along with Haldol 1 mg if Benadryl not working.

## 2018-02-22 NOTE — Discharge Summary (Addendum)
Physician Discharge Summary  Patient ID: Spencer Coleman MRN: [id number] DOB/AGE: 1930-12-27 85 y.o.  Admit date: 02/21/2018 Discharge date: 02/22/2018  Admission Diagnoses:  Discharge Diagnoses:  Active Problems:   BPH with obstruction/lower urinary tract symptoms   Discharged Condition: good  Hospital Course:  Patient was admitted overnight after cystolitholapaxy of a 2 cm bladder stone and TURP.  He did have delirium and agitation overnight secondary to sundowning.  Urine was faint pink with CBI off in the morning and catheter was removed.  He was able to void light red urine spontaneously prior to discharge with PVR of 50cc.  Consults: None  Treatments: surgery: Cystolitholapaxy and TURP  Discharge Exam: Blood pressure (!) 162/85, pulse (!) 58, temperature 98.2 F (36.8 C), temperature source Oral, resp. rate 20, height 5\' 10"  (1.778 m), weight 95.2 kg, SpO2 94 %.  Alert, oriented Abdomen soft, nontender Urine faint pink  Disposition:   Discharge Instructions    Discharge instructions   Complete by:  As directed    You underwent removal of a bladder stone and transurethral resection of the prostate.  It is normal to have some pink to light red urine, urgency, frequency, and urinary leakage temporarily.  Drink plenty of fluids to keep the bladder flushed.  No strenuous activity for 2 weeks.  Call the clinic if you have fever over 101 degrees, are unable to void, or have large blood clots in the urine.  Follow-up in 6 weeks in clinic.     Allergies as of 02/22/2018   No Known Allergies     Medication List    TAKE these medications   acetaminophen 500 MG tablet Commonly known as:  TYLENOL Take 2 tablets (1,000 mg total) by mouth every 6 (six) hours.   amiodarone 400 MG tablet Commonly known as:  PACERONE Take 400 mg by mouth daily.   furosemide 20 MG tablet Commonly known as:  LASIX Take 20 mg by mouth daily.   ibuprofen 200 MG tablet Commonly known as:   ADVIL,MOTRIN Take 400 mg by mouth every 6 (six) hours as needed.   metoprolol succinate 50 MG 24 hr tablet Commonly known as:  TOPROL-XL Take 50 mg by mouth daily.        Signed: Sondra Come 02/22/2018, 8:29 AM

## 2018-02-26 ENCOUNTER — Telehealth: Payer: Self-pay

## 2018-02-26 NOTE — Telephone Encounter (Signed)
No answer unable to leave vmail

## 2018-02-26 NOTE — Telephone Encounter (Signed)
-----   Message from Sondra Come, MD sent at 02/23/2018  4:30 PM EST ----- Please let him know that no cancer was seen in his TURP specimen. This is great news, keep follow up as scheduled  Legrand Rams, MD 02/23/2018

## 2018-03-02 ENCOUNTER — Other Ambulatory Visit: Payer: Self-pay

## 2018-03-02 ENCOUNTER — Emergency Department: Payer: Medicare HMO

## 2018-03-02 ENCOUNTER — Encounter: Payer: Self-pay | Admitting: Emergency Medicine

## 2018-03-02 ENCOUNTER — Emergency Department
Admission: EM | Admit: 2018-03-02 | Discharge: 2018-03-02 | Disposition: A | Discharge status: Home / Self Care | Payer: Medicare HMO | Attending: Emergency Medicine | Admitting: Emergency Medicine

## 2018-03-02 DIAGNOSIS — Z79899 Other long term (current) drug therapy: Secondary | ICD-10-CM | POA: Insufficient documentation

## 2018-03-02 DIAGNOSIS — I509 Heart failure, unspecified: Secondary | ICD-10-CM | POA: Insufficient documentation

## 2018-03-02 DIAGNOSIS — R059 Cough, unspecified: Secondary | ICD-10-CM

## 2018-03-02 DIAGNOSIS — I130 Hypertensive heart and chronic kidney disease with heart failure and stage 1 through stage 4 chronic kidney disease, or unspecified chronic kidney disease: Secondary | ICD-10-CM | POA: Insufficient documentation

## 2018-03-02 DIAGNOSIS — N189 Chronic kidney disease, unspecified: Secondary | ICD-10-CM | POA: Insufficient documentation

## 2018-03-02 DIAGNOSIS — J40 Bronchitis, not specified as acute or chronic: Secondary | ICD-10-CM | POA: Diagnosis not present

## 2018-03-02 DIAGNOSIS — I2510 Atherosclerotic heart disease of native coronary artery without angina pectoris: Secondary | ICD-10-CM | POA: Diagnosis not present

## 2018-03-02 DIAGNOSIS — R05 Cough: Secondary | ICD-10-CM | POA: Diagnosis present

## 2018-03-02 LAB — CBC
HCT: 41.1 % (ref 39.0–52.0)
Hemoglobin: 13.3 g/dL (ref 13.0–17.0)
MCH: 29.6 pg (ref 26.0–34.0)
MCHC: 32.4 g/dL (ref 30.0–36.0)
MCV: 91.3 fL (ref 80.0–100.0)
Platelets: 106 10*3/uL — ABNORMAL LOW (ref 150–400)
RBC: 4.5 MIL/uL (ref 4.22–5.81)
RDW: 13.2 % (ref 11.5–15.5)
WBC: 6.1 10*3/uL (ref 4.0–10.5)
nRBC: 0 % (ref 0.0–0.2)

## 2018-03-02 LAB — COMPREHENSIVE METABOLIC PANEL
ALT: 21 U/L (ref 0–44)
AST: 25 U/L (ref 15–41)
Albumin: 3.5 g/dL (ref 3.5–5.0)
Alkaline Phosphatase: 71 U/L (ref 38–126)
Anion gap: 7 (ref 5–15)
BUN: 17 mg/dL (ref 8–23)
CO2: 25 mmol/L (ref 22–32)
Calcium: 8.3 mg/dL — ABNORMAL LOW (ref 8.9–10.3)
Chloride: 107 mmol/L (ref 98–111)
Creatinine, Ser: 1.07 mg/dL (ref 0.61–1.24)
GFR calc non Af Amer: >60 mL/min (ref >60)
Glucose, Bld: 97 mg/dL (ref 70–99)
Potassium: 4.1 mmol/L (ref 3.5–5.1)
Sodium: 139 mmol/L (ref 135–145)
Total Bilirubin: 2.4 mg/dL — ABNORMAL HIGH (ref 0.3–1.2)
Total Protein: 6 g/dL — ABNORMAL LOW (ref 6.5–8.1)

## 2018-03-02 LAB — TROPONIN I: Troponin I: <0.03 ng/mL (ref <0.03)

## 2018-03-02 MED ORDER — AZITHROMYCIN 250 MG PO TABS: ORAL_TABLET | ORAL | 0 refills | Status: AC

## 2018-03-02 MED ORDER — IPRATROPIUM-ALBUTEROL 0.5-2.5 (3) MG/3ML IN SOLN
3.0000 mL | Freq: Once | RESPIRATORY_TRACT | Status: AC
  Administered 2018-03-02: 3 mL via RESPIRATORY_TRACT
  Filled 2018-03-02: qty 3

## 2018-03-02 MED ORDER — GUAIFENESIN ER 600 MG PO TB12: 600.0000 mg | ORAL_TABLET | Freq: Two times a day (BID) | ORAL | 0 refills | Status: AC

## 2018-03-02 NOTE — ED Triage Notes (Addendum)
Patient from home via [facility]. Patient reports productive cough x3 days. Reports yellow sputum production when he is able to get something up. States breathing is better when he's sitting up. History of CHF but denies new swelling. Also reports feeling some irregular heart beats this morning but states they are gone now.  ICD in place.

## 2018-03-02 NOTE — ED Notes (Signed)
[employer] rep called this RN with results from interrogation; states mainly Atrial arrhythmias Jan 9th-10th; states will fax complete report soon.

## 2018-03-02 NOTE — ED Notes (Signed)
PM interrogated . 

## 2018-03-02 NOTE — ED Provider Notes (Signed)
[facility] Emergency Department Provider Note  Time seen: 10:44 AM  I have reviewed the triage vital signs and the nursing notes.   HISTORY  Chief Complaint Shortness of Breath and Cough    HPI Spencer Coleman is a 85 y.o. male with a past medical history of CAD, CHF, CKD, hypertension, hyperlipidemia, ventricular tachycardia status post defibrillator/pacemaker, presents to the emergency department for cough x3 days.  According to the patient over the past 3 days he has had progressively worsening cough but no sputum production/minimal sputum production.  Patient also states this morning he was having some palpitations as well.  Patient has a Facilities manager.  Denies any chest pain at any point.  Denies any shortness of breath currently.  No fever.  No vomiting.   Past Medical History:  Diagnosis Date  . CAD (coronary artery disease)    Nonobstructive cardiac disease by cath  . CHF (congestive heart failure) (HCC)    nonischemic cardiomyopathy  . CKD (chronic kidney disease)   . History of kidney stones   . Hyperlipidemia   . Hypertension   . Inguinal hernia   . Ventricular tachycardia [facility])    s/p ICD    Patient Active Problem List   Diagnosis Date Noted  . 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  . Thrombocytopenia (HCC) 01/04/2018  . Closed displaced fracture of coronoid process of left ulna 06/28/2016  . Left elbow pain 06/28/2016  . Malnutrition of moderate degree 02/08/2015  . Preop cardiovascular exam 02/03/2015  . Patellar fracture 01/09/2015  . Calculus of kidney 01/09/2015  . Congestive heart failure (HCC) 01/09/2015  . Patella fracture 01/09/2015  . Cardiomyopathy (HCC) 06/06/2013  . History of cardiac catheterization 06/06/2013  . BP (high blood pressure) 06/06/2013  . HLD (hyperlipidemia) 06/06/2013  . ACE inhibitor intolerance 06/06/2013  . Ventricular tachycardia (HCC) 04/05/2013  . SVT  (supraventricular tachycardia) (HCC) 04/05/2013  . Automatic implantable cardioverter-defibrillator in situ 03/25/2013  . Calculus in bladder 08/07/2012  . Gross hematuria 08/07/2012  . Incomplete emptying of bladder 08/07/2012  . Acute cystitis 10/07/2011  . Benign localized prostatic hyperplasia with lower urinary tract symptoms (LUTS) 10/07/2011  . Renal colic 10/07/2011  . Symptom or sign involving genitourinary system 10/07/2011  . Symptoms involving urinary system 10/07/2011    Past Surgical History:  Procedure Laterality Date  . CARDIAC CATHETERIZATION    . CARDIAC DEFIBRILLATOR PLACEMENT  2009  . CYSTOSCOPY WITH LITHOLAPAXY N/A 02/21/2018   Procedure: CYSTOSCOPY WITH LITHOLAPAXY;  Surgeon: Sondra Come, MD;  Location: [facility] ORS;  Service: Urology;  Laterality: N/A;  . ELBOW SURGERY Left   . HOLMIUM LASER APPLICATION N/A 02/21/2018   Procedure: HOLMIUM LASER APPLICATION;  Surgeon: Sondra Come, MD;  Location: [facility] ORS;  Service: Urology;  Laterality: N/A;  . INGUINAL HERNIA REPAIR    . LITHOTRIPSY    . ORIF left wrist fracture Left   . ORIF PATELLA Left 01/10/2015   Procedure: OPEN REDUCTION INTERNAL (ORIF) FIXATION PATELLA;  Surgeon: Donato Heinz, MD;  Location: [facility] ORS;  Service: Orthopedics;  Laterality: Left;  . ORIF PATELLA Left 02/06/2015   Procedure: OPEN REDUCTION INTERNAL (ORIF) FIXATION PATELLA;  Surgeon: Donato Heinz, MD;  Location: [facility] ORS;  Service: Orthopedics;  Laterality: Left;  . TRANSURETHRAL RESECTION OF PROSTATE N/A 02/21/2018   Procedure: TRANSURETHRAL RESECTION OF THE PROSTATE (TURP);  Surgeon: Sondra Come, MD;  Location: [facility] ORS;  Service: Urology;  Laterality: N/A;    Prior  to Admission medications   Medication Sig Start Date End Date Taking? Authorizing Provider  acetaminophen (TYLENOL) 500 MG tablet Take 2 tablets (1,000 mg total) by mouth every 6 (six) hours. 02/22/18   Sondra ComeSninsky, Brian C, MD  amiodarone (PACERONE) 400 MG tablet Take  400 mg by mouth daily.    [provider]  furosemide (LASIX) 20 MG tablet Take 20 mg by mouth daily.    [provider]  ibuprofen (ADVIL,MOTRIN) 200 MG tablet Take 400 mg by mouth every 6 (six) hours as needed.    [provider]  metoprolol succinate (TOPROL-XL) 50 MG 24 hr tablet Take 50 mg by mouth daily.  12/21/17 12/21/18  [provider]    No Known Allergies  Family History  Problem Relation Age of Onset  . CAD Mother   . Kidney cancer Father   . Prostate cancer Neg Hx   . Bladder Cancer Neg Hx     Social History Social History   Tobacco Use  . Smoking status: Never Smoker  . Smokeless tobacco: Never Used  Substance Use Topics  . Alcohol use: No    Alcohol/week: 0.0 standard drinks  . Drug use: No    Review of Systems Constitutional: Negative for fever. Cardiovascular: Negative for chest pain.  Palpitations this morning. Respiratory: Negative for shortness of breath.  Has it for cough with occasional/rare yellow sputum. Gastrointestinal: Negative for abdominal pain, vomiting  Genitourinary: Negative for urinary compaints Musculoskeletal: Negative for musculoskeletal complaints Skin: Negative for skin complaints  Neurological: Negative for headache All other ROS negative  ____________________________________________   PHYSICAL EXAM:  VITAL SIGNS: ED Triage Vitals  Enc Vitals Group     BP 03/02/18 1032 (!) 160/77     Pulse Rate 03/02/18 1032 65     Resp 03/02/18 1032 18     Temp 03/02/18 1032 98.5 F (36.9 C)     Temp Source 03/02/18 1032 Oral     SpO2 03/02/18 1032 94 %     Weight 03/02/18 1034 212 lb (96.2 kg)     Height 03/02/18 1034 5\' 10"  (1.778 m)     Head Circumference --      Peak Flow --      Pain Score 03/02/18 1034 0     Pain Loc --      Pain Edu? --      Excl. in GC? --    Constitutional: Alert and oriented. Well appearing and in no distress. Eyes: Normal exam ENT   Head: Normocephalic and  atraumatic.   Mouth/Throat: Mucous membranes are moist. Cardiovascular: Normal rate, regular rhythm. Respiratory: Normal respiratory effort without tachypnea nor retractions.  Mild expiratory wheeze in left lung fields.  No obvious rales or rhonchi. Gastrointestinal: Soft and nontender. No distention.   Musculoskeletal: Nontender with normal range of motion in all extremities.  Minimal lower extreme edema equal bilaterally.  No calf tenderness. Neurologic:  Normal speech and language. No gross focal neurologic deficits Skin:  Skin is warm, dry and intact.  Psychiatric: Mood and affect are normal.   ____________________________________________    EKG  EKG viewed and interpreted by myself shows an atrial ventricular dual paced rhythm at 64 bpm.  Widened QRS, nonspecific ST changes.  ____________________________________________    RADIOLOGY  Chest x-ray is largely unchanged from prior.  ____________________________________________   INITIAL IMPRESSION / ASSESSMENT AND PLAN / ED COURSE  Pertinent labs & imaging results that were available during my care of the patient were reviewed by  me and considered in my medical decision making (see chart for details).  Patient presents to the emergency department for 3 days of cough, palpitations this morning.  Overall the patient appears well, states wet sounding cough but minimal sputum production.  Differential at this time would include ACS, CHF, pneumonia, URI/bronchitis.  We will check labs, chest x-ray, troponin and continue to closely monitor.  Mild expiratory wheeze on exam we will dose a DuoNeb while awaiting results.  Patient's labs are largely within normal limits.  Chest x-ray unchanged.  Troponin negative.  No pneumonia on chest x-ray.  Given the patient's cough now with sputum production in the emergency department.  We will cover with acute bronchitis with Zithromax and guaifenesin.  We will have the patient follow-up with his  PCP.  [employer] evaluation of his pacemaker performed shows no recent arrhythmias.  ____________________________________________   FINAL CLINICAL IMPRESSION(S) / ED DIAGNOSES  Cough Acute bronchitis   Minna Antis, MD 03/02/18 [phone]

## 2018-03-02 NOTE — Telephone Encounter (Signed)
No answer cannot leave vmail

## 2018-04-02 ENCOUNTER — Telehealth: Payer: Self-pay

## 2018-04-02 NOTE — Telephone Encounter (Signed)
Spoke with patient and he is doing well post op no urinary symptoms at this time.  Per Dr. Inda Castle due to the CoVid19 precautions patient's scheduled appointment was moved ou 4-6wk. Patient verbalized understanding and is in agreement with this plan

## 2018-04-03 ENCOUNTER — Ambulatory Visit: Payer: Medicare HMO | Admitting: Urology

## 2018-05-21 ENCOUNTER — Ambulatory Visit: Payer: Self-pay | Admitting: Urology

## 2018-07-31 ENCOUNTER — Encounter: Payer: Self-pay | Admitting: Urology

## 2018-07-31 ENCOUNTER — Other Ambulatory Visit: Payer: Self-pay

## 2018-07-31 ENCOUNTER — Ambulatory Visit (INDEPENDENT_AMBULATORY_CARE_PROVIDER_SITE_OTHER): Payer: Medicare HMO | Admitting: Urology

## 2018-07-31 VITALS — BP 154/91 | HR 89 | Ht 70.0 in | Wt 210.0 lb

## 2018-07-31 DIAGNOSIS — N401 Enlarged prostate with lower urinary tract symptoms: Secondary | ICD-10-CM

## 2018-07-31 LAB — BLADDER SCAN AMB NON-IMAGING

## 2018-07-31 NOTE — Progress Notes (Signed)
   07/31/2018 4:09 PM   Spencer Coleman [birth year] [id number]  Reason for visit: Follow up TURP/cystolitholopaxy  HPI: I saw Spencer Coleman back in urology clinic today for follow up after 02/21/2018 cystolitholopaxy for 2.5cm bladder stone with TURP for 50g prostate with urinary symptoms.  He reports his urination has improved significantly pre-op.  He reports he is voiding well with a strong stream and having nocturia only 0-2 times per night.  He denies any urgency or urinary incontinence.  He denies any gross hematuria or pelvic pain. PVR today 48cc.  Renal function is normal with last creatinine of 1.07, EGFR greater than 60.  ROS: Please see flowsheet from today's date for complete review of systems.  Physical Exam: BP (!) 154/91   Pulse 89   Ht '5\' 10"'$  (1.778 m)   Wt 210 lb (95.3 kg)   BMI 30.13 kg/m    Constitutional:  Alert and oriented, No acute distress. Respiratory: Normal respiratory effort, no increased work of breathing. GI: Abdomen is soft, nontender, nondistended, no abdominal masses Skin: No rashes, bruises or suspicious lesions. Neurologic: Grossly intact, no focal deficits, moving all 4 extremities. Psychiatric: Normal mood and affect  Laboratory Data: Reviewed  Assessment & Plan:   In summary, the patient is an 85 year old male status post cystolitholapaxy for 2 cm bladder stone and TURP for BPH in February 2020.  He is doing very well and denies any urinary symptoms today.  He is emptying well with a PVR of only 48 cc today in clinic.  RTC 1 year for symptom check and PVR  A total of 15 minutes were spent face-to-face with the patient, greater than 50% was spent in patient education, counseling, and coordination of care regarding BPH and urinary symptoms.   Billey Co, [facility] [address], [place], [place] [address] [phone]

## 2018-08-18 ENCOUNTER — Emergency Department: Payer: Medicare HMO

## 2018-08-18 ENCOUNTER — Emergency Department
Admission: EM | Admit: 2018-08-18 | Discharge: 2018-08-18 | Disposition: A | Discharge status: Home / Self Care | Payer: Medicare HMO | Attending: Student | Admitting: Student

## 2018-08-18 DIAGNOSIS — N189 Chronic kidney disease, unspecified: Secondary | ICD-10-CM | POA: Diagnosis not present

## 2018-08-18 DIAGNOSIS — Z950 Presence of cardiac pacemaker: Secondary | ICD-10-CM | POA: Diagnosis not present

## 2018-08-18 DIAGNOSIS — I130 Hypertensive heart and chronic kidney disease with heart failure and stage 1 through stage 4 chronic kidney disease, or unspecified chronic kidney disease: Secondary | ICD-10-CM | POA: Insufficient documentation

## 2018-08-18 DIAGNOSIS — Z79899 Other long term (current) drug therapy: Secondary | ICD-10-CM | POA: Diagnosis not present

## 2018-08-18 DIAGNOSIS — I509 Heart failure, unspecified: Secondary | ICD-10-CM | POA: Insufficient documentation

## 2018-08-18 DIAGNOSIS — I2510 Atherosclerotic heart disease of native coronary artery without angina pectoris: Secondary | ICD-10-CM | POA: Diagnosis not present

## 2018-08-18 LAB — COMPREHENSIVE METABOLIC PANEL
ALT: 16 U/L (ref 0–44)
AST: 24 U/L (ref 15–41)
Albumin: 3.6 g/dL (ref 3.5–5.0)
Alkaline Phosphatase: 78 U/L (ref 38–126)
Anion gap: 8 (ref 5–15)
BUN: 15 mg/dL (ref 8–23)
CO2: 30 mmol/L (ref 22–32)
Calcium: 8.9 mg/dL (ref 8.9–10.3)
Chloride: 106 mmol/L (ref 98–111)
Creatinine, Ser: 1.32 mg/dL — ABNORMAL HIGH (ref 0.61–1.24)
GFR calc Af Amer: 57 mL/min — ABNORMAL LOW (ref >60)
GFR calc non Af Amer: 49 mL/min — ABNORMAL LOW (ref >60)
Glucose, Bld: 88 mg/dL (ref 70–99)
Potassium: 3.8 mmol/L (ref 3.5–5.1)
Sodium: 144 mmol/L (ref 135–145)
Total Bilirubin: 1.9 mg/dL — ABNORMAL HIGH (ref 0.3–1.2)
Total Protein: 6.3 g/dL — ABNORMAL LOW (ref 6.5–8.1)

## 2018-08-18 LAB — PROTIME-INR
INR: 1.1 (ref 0.8–1.2)
Prothrombin Time: 13.7 seconds (ref 11.4–15.2)

## 2018-08-18 LAB — CBC WITH DIFFERENTIAL/PLATELET
Abs Immature Granulocytes: 0.03 10*3/uL (ref 0.00–0.07)
Basophils Absolute: 0 10*3/uL (ref 0.0–0.1)
Basophils Relative: 1 %
Eosinophils Absolute: 0.1 10*3/uL (ref 0.0–0.5)
Eosinophils Relative: 2 %
HCT: 41.9 % (ref 39.0–52.0)
Hemoglobin: 13.4 g/dL (ref 13.0–17.0)
Immature Granulocytes: 1 %
Lymphocytes Relative: 29 %
Lymphs Abs: 1.6 10*3/uL (ref 0.7–4.0)
MCH: 28.8 pg (ref 26.0–34.0)
MCHC: 32 g/dL (ref 30.0–36.0)
MCV: 90.1 fL (ref 80.0–100.0)
Monocytes Absolute: 0.6 10*3/uL (ref 0.1–1.0)
Monocytes Relative: 11 %
Neutro Abs: 3 10*3/uL (ref 1.7–7.7)
Neutrophils Relative %: 56 %
Platelets: 123 10*3/uL — ABNORMAL LOW (ref 150–400)
RBC: 4.65 MIL/uL (ref 4.22–5.81)
RDW: 14.2 % (ref 11.5–15.5)
WBC: 5.3 10*3/uL (ref 4.0–10.5)
nRBC: 0 % (ref 0.0–0.2)

## 2018-08-18 LAB — MAGNESIUM: Magnesium: 2.2 mg/dL (ref 1.7–2.4)

## 2018-08-18 LAB — TROPONIN I (HIGH SENSITIVITY)
Troponin I (High Sensitivity): 72 ng/L — ABNORMAL HIGH (ref <18)
Troponin I (High Sensitivity): 62 ng/L — ABNORMAL HIGH (ref <18)

## 2018-08-18 LAB — APTT: aPTT: 35 seconds (ref 24–36)

## 2018-08-18 MED ORDER — SODIUM CHLORIDE 0.9% FLUSH: 3.0000 mL | Freq: Once | INTRAVENOUS | Status: DC

## 2018-08-18 NOTE — ED Notes (Signed)
Patient denies CP, SOB, or any symptoms at this time

## 2018-08-18 NOTE — ED Triage Notes (Signed)
Pt arrived by EMS from home with complaints of pacemaker shocking him this morning.

## 2018-08-18 NOTE — ED Provider Notes (Signed)
[facility] Emergency Department Provider Note  ____________________________________________   First MD Initiated Contact with Patient 08/18/18 [phone]     (approximate)  I have reviewed the triage vital signs and the nursing notes.  History  Chief Complaint Pacemaker Problem    HPI Spencer Coleman is a 85 y.o. male with a history of CAD, CHF, ventricular tachycardia status post ICD, who presents the emergency department because he thinks his pacemaker shocked him this morning around 630 AM.  He states he is currently asymptomatic, denies any chest pain, nausea, shortness of breath, lightheadedness.  He states he was otherwise feeling well this morning prior to being shocked.  He reports medication compliance. No acute complaints.          Past Medical Hx Past Medical History:  Diagnosis Date  . CAD (coronary artery disease)    Nonobstructive cardiac disease by cath  . CHF (congestive heart failure) (HCC)    nonischemic cardiomyopathy  . CKD (chronic kidney disease)   . History of kidney stones   . Hyperlipidemia   . Hypertension   . Inguinal hernia   . Ventricular tachycardia [facility](HCC)    s/p ICD    Problem List Patient Active Problem List   Diagnosis Date Noted  . 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  . Thrombocytopenia (HCC) 01/04/2018  . Closed displaced fracture of coronoid process of left ulna 06/28/2016  . Left elbow pain 06/28/2016  . Malnutrition of moderate degree 02/08/2015  . Preop cardiovascular exam 02/03/2015  . Patellar fracture 01/09/2015  . Calculus of kidney 01/09/2015  . Congestive heart failure (HCC) 01/09/2015  . Patella fracture 01/09/2015  . Cardiomyopathy (HCC) 06/06/2013  . History of cardiac catheterization 06/06/2013  . BP (high blood pressure) 06/06/2013  . HLD (hyperlipidemia) 06/06/2013  . ACE inhibitor intolerance 06/06/2013  . Ventricular tachycardia (HCC) 04/05/2013  . SVT  (supraventricular tachycardia) (HCC) 04/05/2013  . Automatic implantable cardioverter-defibrillator in situ 03/25/2013  . Calculus in bladder 08/07/2012  . Gross hematuria 08/07/2012  . Incomplete emptying of bladder 08/07/2012  . Acute cystitis 10/07/2011  . Benign localized prostatic hyperplasia with lower urinary tract symptoms (LUTS) 10/07/2011  . Renal colic 10/07/2011  . Symptom or sign involving genitourinary system 10/07/2011  . Symptoms involving urinary system 10/07/2011    Past Surgical Hx Past Surgical History:  Procedure Laterality Date  . CARDIAC CATHETERIZATION    . CARDIAC DEFIBRILLATOR PLACEMENT  2009  . CYSTOSCOPY WITH LITHOLAPAXY N/A 02/21/2018   Procedure: CYSTOSCOPY WITH LITHOLAPAXY;  Surgeon: Sondra ComeSninsky, Brian C, MD;  Location: [facility] ORS;  Service: Urology;  Laterality: N/A;  . ELBOW SURGERY Left   . HOLMIUM LASER APPLICATION N/A 02/21/2018   Procedure: HOLMIUM LASER APPLICATION;  Surgeon: Sondra ComeSninsky, Brian C, MD;  Location: [facility] ORS;  Service: Urology;  Laterality: N/A;  . INGUINAL HERNIA REPAIR    . LITHOTRIPSY    . ORIF left wrist fracture Left   . ORIF PATELLA Left 01/10/2015   Procedure: OPEN REDUCTION INTERNAL (ORIF) FIXATION PATELLA;  Surgeon: Donato HeinzJames P Hooten, MD;  Location: [facility] ORS;  Service: Orthopedics;  Laterality: Left;  . ORIF PATELLA Left 02/06/2015   Procedure: OPEN REDUCTION INTERNAL (ORIF) FIXATION PATELLA;  Surgeon: Donato HeinzJames P Hooten, MD;  Location: [facility] ORS;  Service: Orthopedics;  Laterality: Left;  . TRANSURETHRAL RESECTION OF PROSTATE N/A 02/21/2018   Procedure: TRANSURETHRAL RESECTION OF THE PROSTATE (TURP);  Surgeon: Sondra ComeSninsky, Brian C, MD;  Location: [facility] ORS;  Service: Urology;  Laterality: N/A;  Medications Prior to Admission medications   Medication Sig Start Date End Date Taking? Authorizing Provider  acetaminophen (TYLENOL) 500 MG tablet Take 2 tablets (1,000 mg total) by mouth every 6 (six) hours. 02/22/18   Billey Co, MD  amiodarone  (PACERONE) 400 MG tablet Take 400 mg by mouth daily.    [provider]  ibuprofen (ADVIL,MOTRIN) 200 MG tablet Take 400 mg by mouth daily.     [provider]  metoprolol succinate (TOPROL-XL) 50 MG 24 hr tablet Take 50 mg by mouth daily.  12/21/17 12/21/18  [provider]    Allergies Patient has no known allergies.  Family Hx Family History  Problem Relation Age of Onset  . CAD Mother   . Kidney cancer Father   . Prostate cancer Neg Hx   . Bladder Cancer Neg Hx     Social Hx Social History   Tobacco Use  . Smoking status: Never Smoker  . Smokeless tobacco: Never Used  Substance Use Topics  . Alcohol use: No    Alcohol/week: 0.0 standard drinks  . Drug use: No     Review of Systems  Constitutional: Negative for fever. Negative for chills. Eyes: Negative for visual changes. ENT: Negative for sore throat. Cardiovascular: Negative for chest pain. Respiratory: Negative for shortness of breath. Gastrointestinal: Negative for abdominal pain. Negative for nausea. Negative for vomiting. Genitourinary: Negative for dysuria. Musculoskeletal: Negative for leg swelling. Skin: Negative for rash. Neurological: Negative for for headaches.   Physical Exam  Vital Signs: ED Triage Vitals  Enc Vitals Group     BP 08/18/18 0835 (!) 174/98     Pulse Rate 08/18/18 0835 76     Resp 08/18/18 0835 16     Temp 08/18/18 0835 (!) 97.3 F (36.3 C)     Temp Source 08/18/18 0835 Oral     SpO2 08/18/18 0835 95 %     Weight 08/18/18 0836 205 lb (93 kg)     Height 08/18/18 0836 5\' 10"  (1.778 m)     Head Circumference --      Peak Flow --      Pain Score 08/18/18 0835 0     Pain Loc --      Pain Edu? --      Excl. in [place]? --     Constitutional: Alert and oriented.  Eyes: Conjunctivae clear. Sclera anicteric. Head: Normocephalic. Atraumatic. Nose: No congestion. No rhinorrhea. Mouth/Throat: Mucous membranes are moist.  Neck: No stridor.    Cardiovascular: Normal rate, regular rhythm.  Pacemaker left upper chest.. Extremities well perfused. Respiratory: Normal respiratory effort.  Lungs CTAB. Gastrointestinal: Soft and non-tender. No distention.  Musculoskeletal: No lower extremity edema. Neurologic:  Normal speech and language. No gross focal neurologic deficits are appreciated.  Skin: Skin is warm, dry and intact. No rash noted. Psychiatric: Mood and affect are appropriate for situation.  EKG  Personally reviewed.   Rate: 75 Rhythm: Paced rhythm Axis: Paced rhythm Intervals: Abnormal due to paced rhythm No acute ischemic changes.  Negative Sgarbossa criteria.    Radiology  XR: no acute findings.    Procedures  Procedure(s) performed (including critical care):  Procedures   Initial Impression / Assessment and Plan / ED Course  85 y.o. male with history of CAD, CHF, ventricular tachycardia status post ICD who presents to the ED because he felt like his pacemaker shocked him this morning. Otherwise he feels well currently.   Plan: labs, EKG, interrogate pacemaker.  EKG shows paced  rhythm, negative Sgarbossa criteria, no evidence of ischemia.   Formal interrogation report reveals no shocks delivered.  No runs of V. Tach.  Discussed with Cardiology. Recommend talking with [employer] rep to confirm no shock. If so, patient can follow up in clinic. Discussed very mildly elevated HS trop with cardiology, no further emergent evaluation recommended - likely due to his chronic disease, and they are down trending.   Discussed interrogation report with [employer] rep, who does confirm that there were no shocks delivered.  No runs of V. tach or other arrhythmias.  Update patient on results.  He continues to feel well.  Discussed his mildly elevated high-sensitivity troponins, as well as discussion with Cardiology.  Discussed conservative management with admission versus close outpatient follow-up.  At this time, the  patient does not desire admission, and states he will follow-up with cardiology on Monday morning. We will plan for discharge, discussed strict return precautions.  Patient voices understanding and is comfortable with the plan at this time.    Final Clinical Impression(s) / ED Diagnosis  Final diagnoses:  Pacemaker     Note:  This document was prepared using Dragon voice recognition software and may include unintentional dictation errors.   Miguel AschoffMonks, Kaytee Taliercio L., MD 08/18/18 [phone]

## 2018-08-18 NOTE — ED Notes (Signed)
[employer] pacemaker interrogated at this time.

## 2018-08-18 NOTE — Discharge Instructions (Addendum)
Thank you for letting us take care of you in the emergency department today.   Please continue to take your regular, prescribed medications.   Please follow up with your cardiologist to review your ER visit and follow up on your symptoms.   Please return to the ER for any new or worsening symptoms.

## 2018-08-18 NOTE — ED Notes (Signed)
Defibrillator has not discharged. Everything looks good on report. Faxing now per [employer] staff

## 2018-09-23 IMAGING — US US EXTREM  UP VENOUS*L*
1 series · 13 of 24 positions shown · non-contrast
Comparison: None.

CLINICAL DATA: Acute onset of left arm swelling and bruising, after
recent fall. Initial encounter.



[Series 1: us extrem up venous*left* · 13 of 39 slices shown]
[im 1/39]
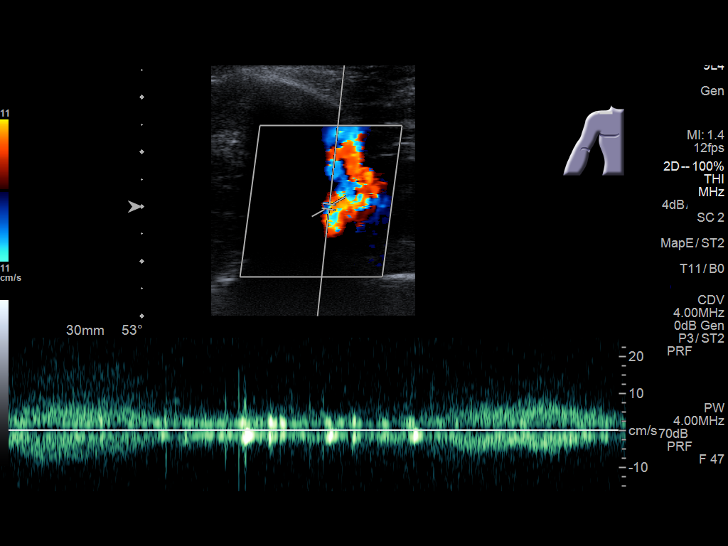
[im 4/39]
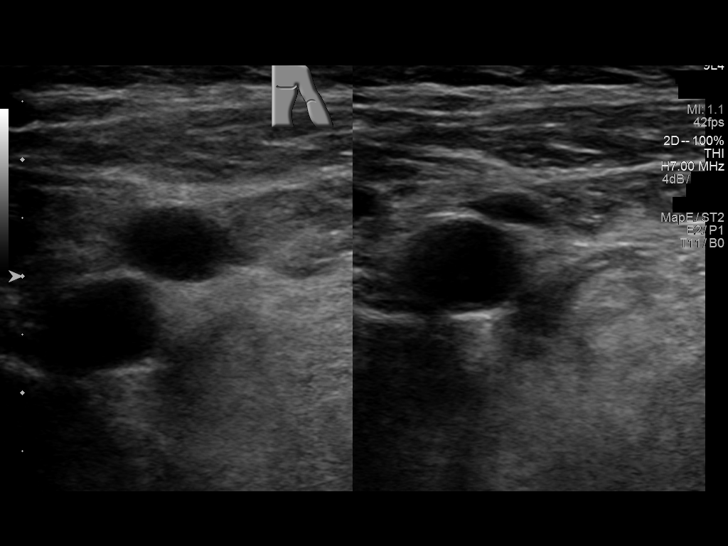
[im 7/39]
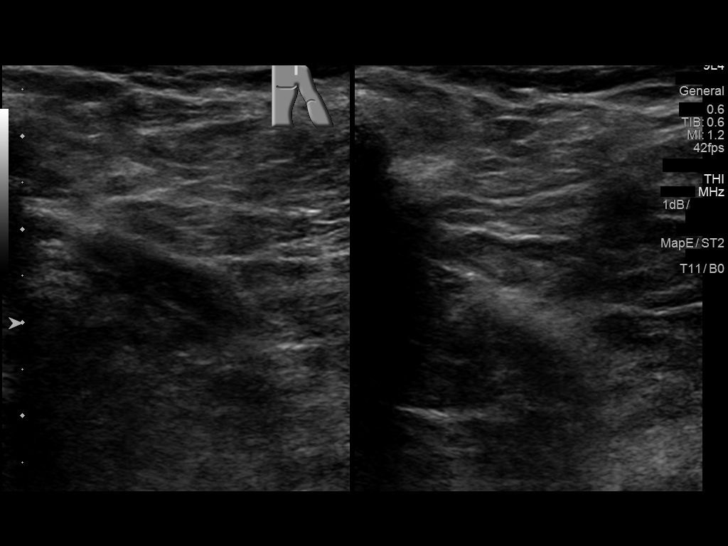
[im 10/39]
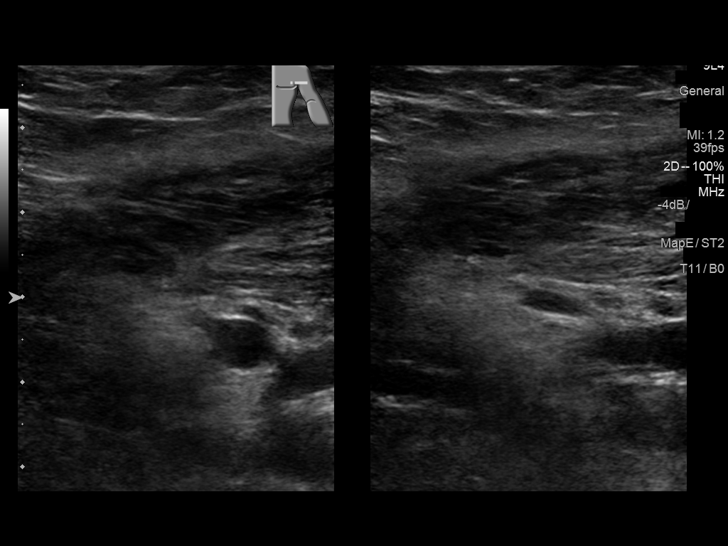
[im 14/39]
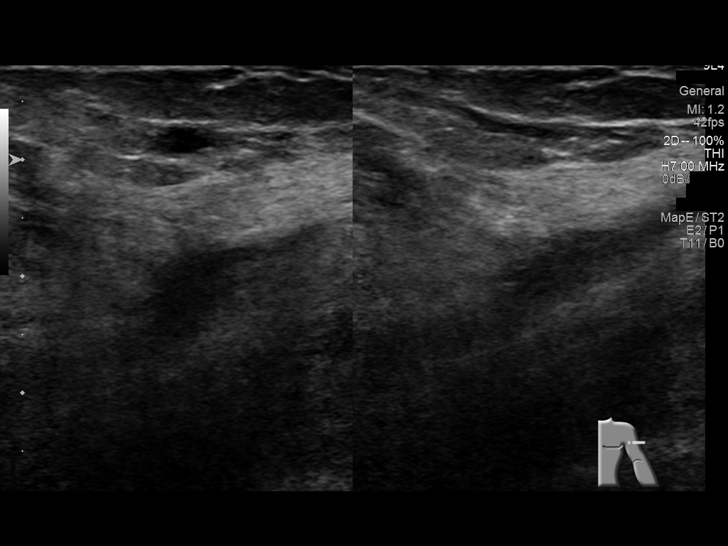
[im 17/39]
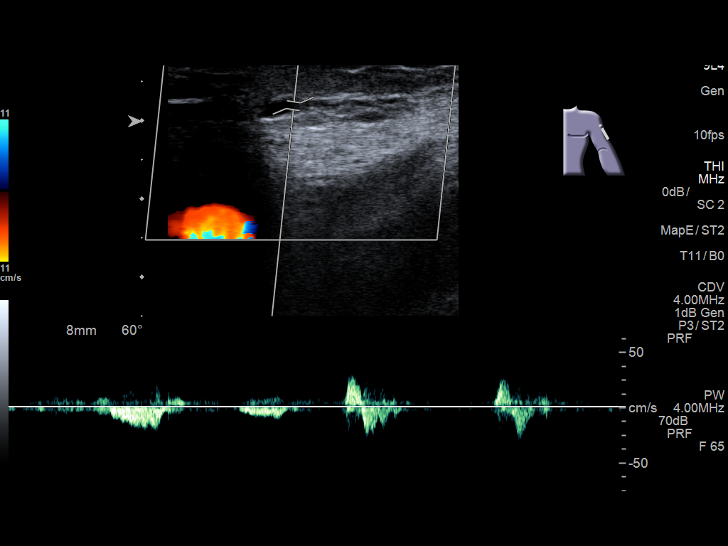
[im 20/39]
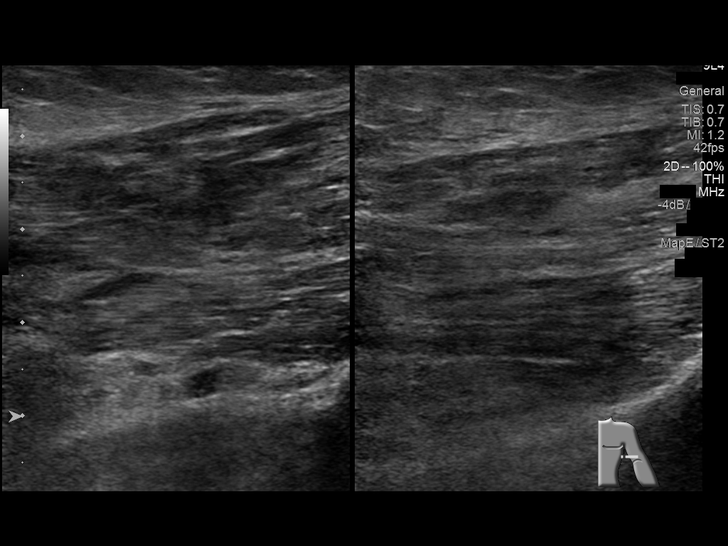
[im 22/39]
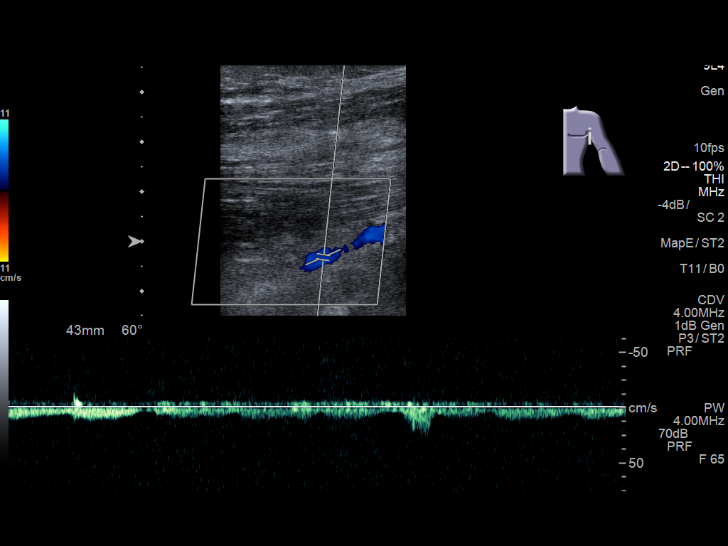
[im 25/39]
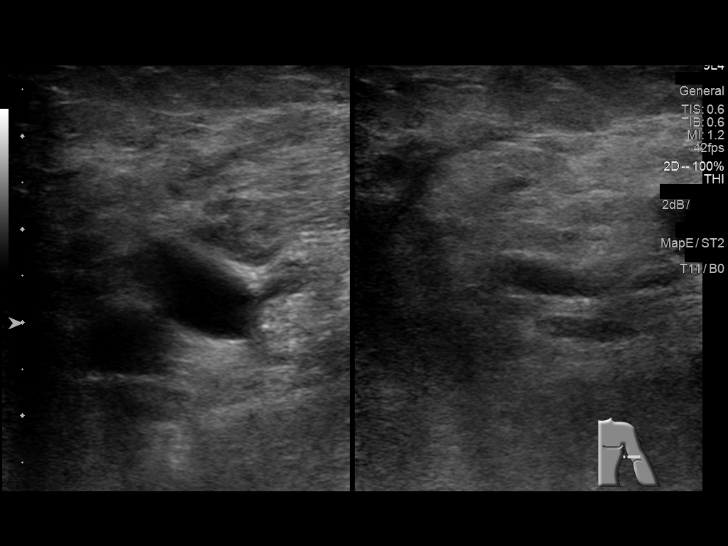
[im 29/39]
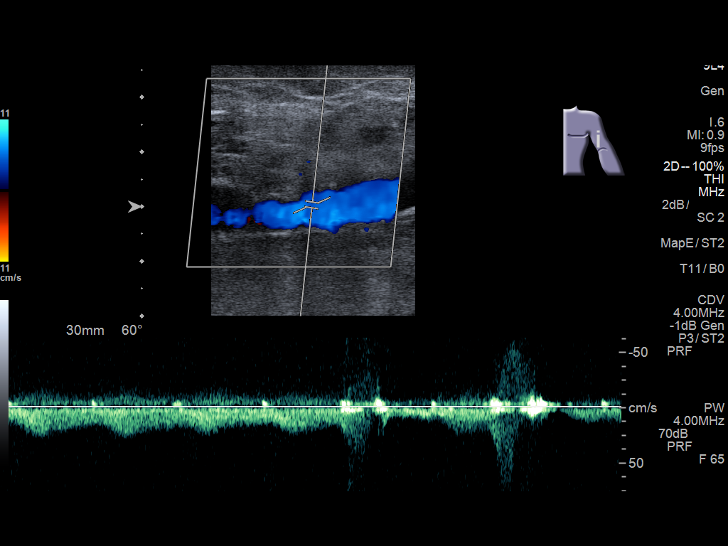
[im 32/39]
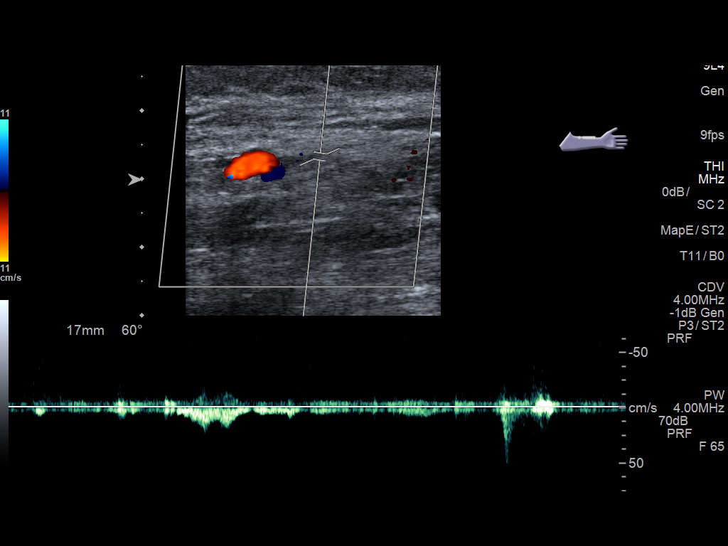
[im 35/39]
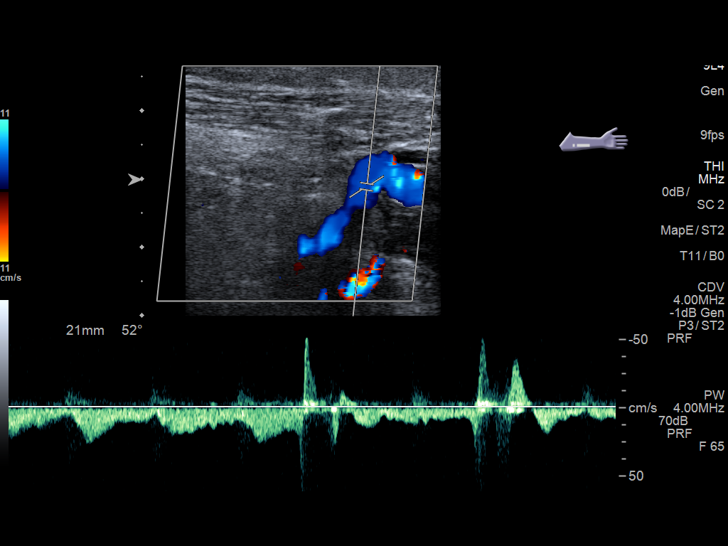
[im 39/39]
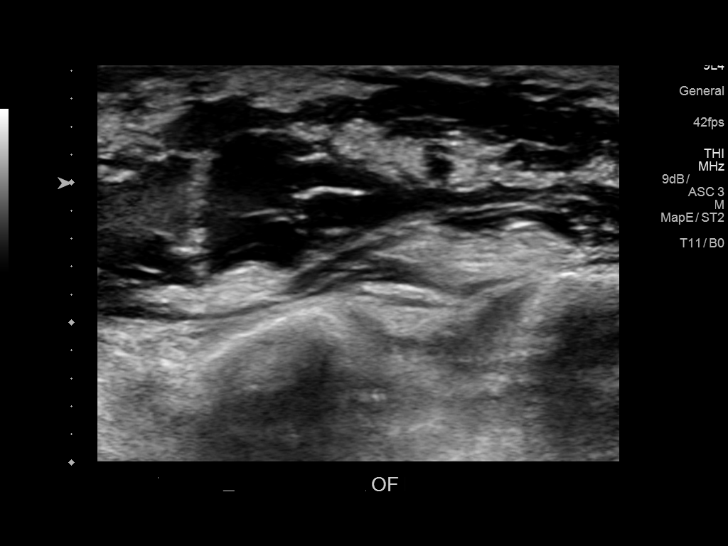

[13 of 24 positions shown; findings below may reference images not displayed]

FINDINGS: Contralateral Subclavian Vein: Respiratory phasicity is normal and
symmetric with the symptomatic side. No evidence of thrombus. Normal
compressibility.

Internal Jugular Vein: No evidence of thrombus. Normal
compressibility, respiratory phasicity and response to augmentation.

Subclavian Vein: No evidence of thrombus. Normal compressibility,
respiratory phasicity and response to augmentation.

Axillary Vein: No evidence of thrombus. Normal compressibility,
respiratory phasicity and response to augmentation.

Cephalic Vein: No evidence of thrombus. Normal compressibility,
respiratory phasicity and response to augmentation.

Basilic Vein: No evidence of thrombus. Normal compressibility,
respiratory phasicity and response to augmentation.

Brachial Veins: No evidence of thrombus. Normal compressibility,
respiratory phasicity and response to augmentation.

Radial Veins: No evidence of thrombus. Normal compressibility,
respiratory phasicity and response to augmentation.

Ulnar Veins: No evidence of thrombus. Normal compressibility,
respiratory phasicity and response to augmentation.

Venous Reflux:  None visualized.

Other Findings: Diffuse soft tissue edema noted about the left
forearm and left hand, at the region of bruising.
IMPRESSION: 1. No evidence of DVT within the left upper extremity.
2. Diffuse soft tissue edema about the left forearm and left hand,
at the region of bruising.

## 2019-08-06 ENCOUNTER — Encounter: Payer: Self-pay | Admitting: Urology

## 2019-08-06 ENCOUNTER — Ambulatory Visit: Payer: Medicare HMO | Admitting: Urology

## 2019-08-06 ENCOUNTER — Other Ambulatory Visit: Payer: Self-pay

## 2019-08-06 VITALS — BP 154/84 | HR 79 | Ht 69.0 in | Wt 167.0 lb

## 2019-08-06 DIAGNOSIS — N401 Enlarged prostate with lower urinary tract symptoms: Secondary | ICD-10-CM

## 2019-08-06 LAB — BLADDER SCAN AMB NON-IMAGING: Scan Result: 0

## 2019-08-06 NOTE — Progress Notes (Signed)
   08/06/2019 1:35 PM   Spencer Coleman 07/10/1930 [id number]  Reason for visit: Follow up BPH, history of bladder stones  HPI: I saw Spencer Coleman back in urology clinic for follow-up of the above issues.  He is a 86 year old male who underwent a cystolitholapaxy for a 2 cm bladder stone, as well as a TURP for a 50 g prostate with urinary symptoms with me in February 2020.  He has been doing extremely well since that time.  He is urinating with a strong stream and denies any gross hematuria, incontinence, urgency, or weak stream.  Bladder scan is 0 mL in clinic today.  We discussed at length that he can follow-up on an as-needed basis at this point, and we discussed return precautions at length including hematuria, UTIs, or weakening urinary stream.  Follow-up as needed  I spent 20 total minutes on the day of the encounter including pre-visit review of the medical record, face-to-face time with the patient, and post visit ordering of labs/imaging/tests.  Sondra Come, MD  [facility] [address], [address] [place], [place] [address] [phone]

## 2021-01-04 ENCOUNTER — Emergency Department
Admission: EM | Admit: 2021-01-04 | Discharge: 2021-01-04 | Disposition: A | Discharge status: Home / Self Care | Payer: Medicare HMO | Attending: Emergency Medicine | Admitting: Emergency Medicine

## 2021-01-04 ENCOUNTER — Other Ambulatory Visit: Payer: Self-pay

## 2021-01-04 ENCOUNTER — Emergency Department: Payer: Medicare HMO

## 2021-01-04 DIAGNOSIS — U071 COVID-19: Secondary | ICD-10-CM | POA: Diagnosis not present

## 2021-01-04 DIAGNOSIS — Z95810 Presence of automatic (implantable) cardiac defibrillator: Secondary | ICD-10-CM | POA: Insufficient documentation

## 2021-01-04 DIAGNOSIS — Z79899 Other long term (current) drug therapy: Secondary | ICD-10-CM | POA: Insufficient documentation

## 2021-01-04 DIAGNOSIS — N189 Chronic kidney disease, unspecified: Secondary | ICD-10-CM | POA: Insufficient documentation

## 2021-01-04 DIAGNOSIS — I2510 Atherosclerotic heart disease of native coronary artery without angina pectoris: Secondary | ICD-10-CM | POA: Diagnosis not present

## 2021-01-04 DIAGNOSIS — R7989 Other specified abnormal findings of blood chemistry: Secondary | ICD-10-CM | POA: Insufficient documentation

## 2021-01-04 DIAGNOSIS — I509 Heart failure, unspecified: Secondary | ICD-10-CM | POA: Diagnosis not present

## 2021-01-04 DIAGNOSIS — I130 Hypertensive heart and chronic kidney disease with heart failure and stage 1 through stage 4 chronic kidney disease, or unspecified chronic kidney disease: Secondary | ICD-10-CM | POA: Diagnosis not present

## 2021-01-04 DIAGNOSIS — R5383 Other fatigue: Secondary | ICD-10-CM | POA: Diagnosis present

## 2021-01-04 LAB — RESP PANEL BY RT-PCR (FLU A&B, COVID) ARPGX2
Influenza A by PCR: NEGATIVE
Influenza B by PCR: NEGATIVE
SARS Coronavirus 2 by RT PCR: POSITIVE — AB

## 2021-01-04 LAB — COMPREHENSIVE METABOLIC PANEL
ALT: 13 U/L (ref 0–44)
AST: 21 U/L (ref 15–41)
Albumin: 3.3 g/dL — ABNORMAL LOW (ref 3.5–5.0)
Alkaline Phosphatase: 72 U/L (ref 38–126)
Anion gap: 8 (ref 5–15)
BUN: 23 mg/dL (ref 8–23)
CO2: 25 mmol/L (ref 22–32)
Calcium: 9 mg/dL (ref 8.9–10.3)
Chloride: 104 mmol/L (ref 98–111)
Creatinine, Ser: 1.67 mg/dL — ABNORMAL HIGH (ref 0.61–1.24)
GFR, Estimated: 39 mL/min — ABNORMAL LOW (ref >60)
Glucose, Bld: 127 mg/dL — ABNORMAL HIGH (ref 70–99)
Potassium: 3.5 mmol/L (ref 3.5–5.1)
Sodium: 137 mmol/L (ref 135–145)
Total Bilirubin: 2.2 mg/dL — ABNORMAL HIGH (ref 0.3–1.2)
Total Protein: 6.6 g/dL (ref 6.5–8.1)

## 2021-01-04 LAB — CBC WITH DIFFERENTIAL/PLATELET
Abs Immature Granulocytes: 0.08 10*3/uL — ABNORMAL HIGH (ref 0.00–0.07)
Basophils Absolute: 0.1 10*3/uL (ref 0.0–0.1)
Basophils Relative: 1 %
Eosinophils Absolute: 0 10*3/uL (ref 0.0–0.5)
Eosinophils Relative: 0 %
HCT: 46.2 % (ref 39.0–52.0)
Hemoglobin: 15.2 g/dL (ref 13.0–17.0)
Immature Granulocytes: 1 %
Lymphocytes Relative: 20 %
Lymphs Abs: 1.9 10*3/uL (ref 0.7–4.0)
MCH: 29.4 pg (ref 26.0–34.0)
MCHC: 32.9 g/dL (ref 30.0–36.0)
MCV: 89.4 fL (ref 80.0–100.0)
Monocytes Absolute: 0.9 10*3/uL (ref 0.1–1.0)
Monocytes Relative: 9 %
Neutro Abs: 6.8 10*3/uL (ref 1.7–7.7)
Neutrophils Relative %: 69 %
Platelets: 181 10*3/uL (ref 150–400)
RBC: 5.17 MIL/uL (ref 4.22–5.81)
RDW: 12.4 % (ref 11.5–15.5)
WBC: 9.8 10*3/uL (ref 4.0–10.5)
nRBC: 0 % (ref 0.0–0.2)

## 2021-01-04 LAB — TROPONIN I (HIGH SENSITIVITY)
Troponin I (High Sensitivity): 95 ng/L — ABNORMAL HIGH (ref <18)
Troponin I (High Sensitivity): 91 ng/L — ABNORMAL HIGH (ref <18)

## 2021-01-04 LAB — LIPASE, BLOOD: Lipase: 23 U/L (ref 11–51)

## 2021-01-04 LAB — MAGNESIUM: Magnesium: 2 mg/dL (ref 1.7–2.4)

## 2021-01-04 MED ORDER — ONDANSETRON 4 MG PO TBDP: 4.0000 mg | ORAL_TABLET | Freq: Three times a day (TID) | ORAL | 0 refills | Status: DC | PRN

## 2021-01-04 MED ORDER — MOLNUPIRAVIR EUA 200MG CAPSULE: 4.0000 | ORAL_CAPSULE | Freq: Two times a day (BID) | ORAL | 0 refills | Status: DC

## 2021-01-04 NOTE — ED Notes (Signed)
Patient's son, Chip called and informed that patient has discharge orders. Son refuses to pick up patient, and states no family members will provide transportation because they do not want to be exposed to COVID.

## 2021-01-04 NOTE — ED Provider Notes (Signed)
Emergency Medicine Provider Triage Evaluation Note  Spencer Coleman , a 87 y.o. male  was evaluated in triage.  Pt complains of nausea, for 5 days. He was exposed to COVID several days ago. His defribrillator fired this morning. He denies chest pain.   Review of Systems  Positive: nausea Negative: Chest pain.  Physical Exam  Ht 5\' 9"  (1.753 m)    Wt 75.8 kg    BMI 24.68 kg/m  Gen:   Awake, no distress   Resp:  Normal effort  MSK:   Moves extremities without difficulty  Other:    Medical Decision Making  Medically screening exam initiated at 11:53 AM.  Appropriate orders placed.  Spencer Coleman was informed that the remainder of the evaluation will be completed by another provider, this initial triage assessment does not replace that evaluation, and the importance of remaining in the ED until their evaluation is complete.    [name], FNP 01/04/21 1200    01/06/21, MD 01/04/21 1901

## 2021-01-04 NOTE — ED Triage Notes (Signed)
Presents via EMS from home  states his defrillator fired this am  Denies any pain

## 2021-01-04 NOTE — ED Provider Notes (Signed)
[facility] Emergency Department Provider Note  ____________________________________________  Time seen: Approximately 5:44 PM  I have reviewed the triage vital signs and the nursing notes.   HISTORY  Chief Complaint Nausea and Pacemaker Problem    HPI Spencer Coleman is a 87 y.o. male with a history of CAD CHF CKD hypertension, AICD who complains of fatigue, nausea, decreased appetite for the last 2 days.  In triage he reported that he had been feeling ill for 5 days but he clarifies to me that its just to.  Also today he felt that his AICD fired.  It was last checked at cardiology clinic 4 months ago according the patient. I reviewed electronic medical record, cardiology note from a visit October 22, 2020 showed satisfactory health maintenance, and suspected paroxysmal ventricular tachycardia due to noncompliance with amiodarone therapy.  Patient notes that over the holidays he has been exposed to lots of people that have been visibly ill and he suspects he may have COVID.  Past Medical History:  Diagnosis Date   CAD (coronary artery disease)    Nonobstructive cardiac disease by cath   CHF (congestive heart failure) (HCC)    nonischemic cardiomyopathy   CKD (chronic kidney disease)    History of kidney stones    Hyperlipidemia    Hypertension    Inguinal hernia    Ventricular tachycardia ([place])    s/p ICD     Patient Active Problem List   Diagnosis Date Noted   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   Thrombocytopenia ([place]) 01/04/2018   Closed displaced fracture of coronoid process of left ulna 06/28/2016   Left elbow pain 06/28/2016   Malnutrition of moderate degree 02/08/2015   Preop cardiovascular exam 02/03/2015   Patellar fracture 01/09/2015   Calculus of kidney 01/09/2015   Congestive heart failure ([place]) 01/09/2015   Patella fracture 01/09/2015   Cardiomyopathy ([place]) 06/06/2013   History of  cardiac catheterization 06/06/2013   BP (high blood pressure) 06/06/2013   HLD (hyperlipidemia) 06/06/2013   ACE inhibitor intolerance 06/06/2013   Ventricular tachycardia 04/05/2013   SVT (supraventricular tachycardia) ([place]) 04/05/2013   Automatic implantable cardioverter-defibrillator in situ 03/25/2013   Calculus in bladder 08/07/2012   Gross hematuria 08/07/2012   Incomplete emptying of bladder 08/07/2012   Acute cystitis 10/07/2011   Benign localized prostatic hyperplasia with lower urinary tract symptoms (LUTS) 123XX123   Renal colic 123XX123   Symptom or sign involving genitourinary system 10/07/2011   Symptoms involving urinary system 10/07/2011     Past Surgical History:  Procedure Laterality Date   CARDIAC CATHETERIZATION     CARDIAC DEFIBRILLATOR PLACEMENT  2009   CYSTOSCOPY WITH LITHOLAPAXY N/A 02/21/2018   Procedure: CYSTOSCOPY WITH LITHOLAPAXY;  Surgeon: Billey Co, MD;  Location: [facility] ORS;  Service: Urology;  Laterality: N/A;   ELBOW SURGERY Left    HOLMIUM LASER APPLICATION N/A [id number]   Procedure: HOLMIUM LASER APPLICATION;  Surgeon: Billey Co, MD;  Location: [facility] ORS;  Service: Urology;  Laterality: N/A;   INGUINAL HERNIA REPAIR     LITHOTRIPSY     ORIF left wrist fracture Left    ORIF PATELLA Left 01/10/2015   Procedure: OPEN REDUCTION INTERNAL (ORIF) FIXATION PATELLA;  Surgeon: Dereck Leep, MD;  Location: [facility] ORS;  Service: Orthopedics;  Laterality: Left;   ORIF PATELLA Left 02/06/2015   Procedure: OPEN REDUCTION INTERNAL (ORIF) FIXATION PATELLA;  Surgeon: Dereck Leep, MD;  Location: [facility] ORS;  Service: Orthopedics;  Laterality:  Left;   TRANSURETHRAL RESECTION OF PROSTATE N/A 02/21/2018   Procedure: TRANSURETHRAL RESECTION OF THE PROSTATE (TURP);  Surgeon: Sondra Come, MD;  Location: [facility] ORS;  Service: Urology;  Laterality: N/A;     Prior to Admission medications   Medication Sig Start Date End Date  Taking? Authorizing Provider  molnupiravir EUA (LAGEVRIO) 200 mg CAPS capsule Take 4 capsules (800 mg total) by mouth 2 (two) times daily for 5 days. 01/04/21 01/09/21 Yes Sharman Cheek, MD  ondansetron (ZOFRAN-ODT) 4 MG disintegrating tablet Take 1 tablet (4 mg total) by mouth every 8 (eight) hours as needed for nausea or vomiting. 01/04/21  Yes Sharman Cheek, MD  acetaminophen (TYLENOL) 500 MG tablet Take 2 tablets (1,000 mg total) by mouth every 6 (six) hours. 02/22/18   Sondra Come, MD  amiodarone (PACERONE) 400 MG tablet Take 400 mg by mouth daily.    [provider]  ibuprofen (ADVIL,MOTRIN) 200 MG tablet Take 400 mg by mouth daily.     [provider]  metoprolol succinate (TOPROL-XL) 50 MG 24 hr tablet Take 50 mg by mouth daily.  12/21/17   [provider]     Allergies Patient has no known allergies.   Family History  Problem Relation Age of Onset   CAD Mother    Kidney cancer Father    Prostate cancer Neg Hx    Bladder Cancer Neg Hx     Social History Social History   Tobacco Use   Smoking status: Never   Smokeless tobacco: Never  Vaping Use   Vaping Use: Never used  Substance Use Topics   Alcohol use: No    Alcohol/week: 0.0 standard drinks   Drug use: No    Review of Systems  Constitutional:   No fever or chills.  Positive malaise and fatigue ENT:   No sore throat. No rhinorrhea. Cardiovascular:   No chest pain or syncope. Respiratory:   No dyspnea or cough. Gastrointestinal:   Negative for abdominal pain, vomiting and diarrhea.  Musculoskeletal:   Negative for focal pain or swelling All other systems reviewed and are negative except as documented above in ROS and HPI.  ____________________________________________   PHYSICAL EXAM:  VITAL SIGNS: ED Triage Vitals  Enc Vitals Group     BP 01/04/21 1155 119/66     Pulse Rate 01/04/21 1155 (!) 44     Resp 01/04/21 1155 18     Temp 01/04/21 1155 (!) 97.5 F  (36.4 C)     Temp Source 01/04/21 1155 Oral     SpO2 01/04/21 1155 96 %     Weight 01/04/21 1146 167 lb 1.7 oz (75.8 kg)     Height 01/04/21 1146 5\' 9"  (1.753 m)     Head Circumference --      Peak Flow --      Pain Score 01/04/21 1146 0     Pain Loc --      Pain Edu? --      Excl. in GC? --     Vital signs reviewed, nursing assessments reviewed.   Constitutional:   Alert and oriented. Non-toxic appearance. Eyes:   Conjunctivae are normal. EOMI. PERRL. ENT      Head:   Normocephalic and atraumatic.      Nose:   Normal      Mouth/Throat:   Normal, moist mucosa      Neck:   No meningismus. Full ROM. Hematological/Lymphatic/Immunilogical:   No cervical lymphadenopathy. Cardiovascular:   RRR. Symmetric  bilateral radial and DP pulses.  No murmurs. Cap refill less than 2 seconds. Respiratory:   Normal respiratory effort without tachypnea/retractions. Breath sounds are clear and equal bilaterally. No wheezes/rales/rhonchi. Gastrointestinal:   Soft and nontender. Non distended. There is no CVA tenderness.  No rebound, rigidity, or guarding. Genitourinary:   deferred Musculoskeletal:   Normal range of motion in all extremities. No joint effusions.  No lower extremity tenderness.  No edema. Neurologic:   Normal speech and language.  Motor grossly intact. No acute focal neurologic deficits are appreciated.  Skin:    Skin is warm, dry and intact. No rash noted.  No petechiae, purpura, or bullae.  ____________________________________________    LABS (pertinent positives/negatives) (all labs ordered are listed, but only abnormal results are displayed) Labs Reviewed  RESP PANEL BY RT-PCR (FLU A&B, COVID) ARPGX2 - Abnormal; Notable for the following components:      Result Value   SARS Coronavirus 2 by RT PCR POSITIVE (*)    All other components within normal limits  COMPREHENSIVE METABOLIC PANEL - Abnormal; Notable for the following components:   Glucose, Bld 127 (*)    Creatinine,  Ser 1.67 (*)    Albumin 3.3 (*)    Total Bilirubin 2.2 (*)    GFR, Estimated 39 (*)    All other components within normal limits  CBC WITH DIFFERENTIAL/PLATELET - Abnormal; Notable for the following components:   Abs Immature Granulocytes 0.08 (*)    All other components within normal limits  TROPONIN I (HIGH SENSITIVITY) - Abnormal; Notable for the following components:   Troponin I (High Sensitivity) 95 (*)    All other components within normal limits  LIPASE, BLOOD  MAGNESIUM  TROPONIN I (HIGH SENSITIVITY)   ____________________________________________   EKG  Interpreted by me Dual paced rhythm, rate of 82.  Left axis, no acute ischemic changes.  ____________________________________________    RADIOLOGY  DG Chest Portable 1 View  Result Date: 01/04/2021 CLINICAL DATA:  Nausea for 5 days, chest pain EXAM: PORTABLE CHEST 1 VIEW COMPARISON:  08/18/2018 FINDINGS: Single frontal view of the chest demonstrates stable AICD. Cardiac silhouette is unremarkable. No airspace disease, effusion, or pneumothorax. Chronic elevation right hemidiaphragm. No acute bony abnormalities. IMPRESSION: 1. Stable chest, no acute process. Electronically Signed   By: Randa Ngo M.D.   On: 01/04/2021 15:41    ____________________________________________   PROCEDURES Procedures  ____________________________________________  DIFFERENTIAL DIAGNOSIS   Pneumonia, pleural effusion, pulmonary edema, non-STEMI, COVID, dehydration  CLINICAL IMPRESSION / ASSESSMENT AND PLAN / ED COURSE  Medications ordered in the ED: Medications - No data to display  Pertinent labs & imaging results that were available during my care of the patient were reviewed by me and considered in my medical decision making (see chart for details).  RICARDO HOLLIE was evaluated in Emergency Department on 01/04/2021 for the symptoms described in the history of present illness. He was evaluated in the context of the global  COVID-19 pandemic, which necessitated consideration that the patient might be at risk for infection with the SARS-CoV-2 virus that causes COVID-19. Institutional protocols and algorithms that pertain to the evaluation of patients at risk for COVID-19 are in a state of rapid change based on information released by regulatory bodies including the CDC and federal and state organizations. These policies and algorithms were followed during the patient's care in the ED.   Patient presents with malaise, constitutional symptoms.  Chest x-ray EKG and initial labs are all unremarkable.  Troponin is mildly  elevated at 95 which may be chronic baseline.  We will repeat to trend.  His COVID test is positive and with 2 days of symptoms we will start him on medication.  He reports that he has resumed taking amiodarone so we will use molnupiravir  Clinical Course as of 01/04/21 1913  Mon Jan 04, 2021  1912 Trop stable. Doubt ACS, PE, dissection, carditis. Stable for DC home. [PS]    Clinical Course User Index [PS] Carrie Mew, MD     ____________________________________________   FINAL CLINICAL IMPRESSION(S) / ED DIAGNOSES    Final diagnoses:  COVID-19 virus infection     ED Discharge Orders          Ordered    ondansetron (ZOFRAN-ODT) 4 MG disintegrating tablet  Every 8 hours PRN        01/04/21 1743    molnupiravir EUA (LAGEVRIO) 200 mg CAPS capsule  2 times daily        01/04/21 1743            Portions of this note were generated with dragon dictation software. Dictation errors may occur despite best attempts at proofreading.    Carrie Mew, MD 01/04/21 [phone]

## 2021-01-04 NOTE — ED Notes (Signed)
Patient's son Chip called and informed that cab services are not running and as patient is alert, oriented and lives independently he does not qualify for ambulance transport home. Son informed that he will need to arrange transportation and patient will be waiting in the ER lobby. Chip verbalized understanding.

## 2021-01-04 NOTE — ED Triage Notes (Signed)
Pt via EMS from home c/o nausea x 5 days with no vomiting or fever. He is concerned that he might have COVID due to possible exposure over the holiday. He says that he has a defibrillator that was implanted 13 years ago and today it fired while he was at home. He says "a flame came out" in front of his chest when it fired today. He denies CP and palpitations, no SOB or diaphoresis. He is in NAD in triage and a/o x 4.

## 2021-01-06 ENCOUNTER — Emergency Department: Payer: Medicare HMO

## 2021-01-06 ENCOUNTER — Other Ambulatory Visit: Payer: Self-pay

## 2021-01-06 ENCOUNTER — Observation Stay
Admission: EM | Admit: 2021-01-06 | Discharge: 2021-01-07 | Disposition: A | Discharge status: Home / Self Care | Payer: Medicare HMO | Attending: Internal Medicine | Admitting: Internal Medicine

## 2021-01-06 DIAGNOSIS — U071 COVID-19: Secondary | ICD-10-CM | POA: Diagnosis not present

## 2021-01-06 DIAGNOSIS — Z79899 Other long term (current) drug therapy: Secondary | ICD-10-CM | POA: Diagnosis not present

## 2021-01-06 DIAGNOSIS — I10 Essential (primary) hypertension: Secondary | ICD-10-CM

## 2021-01-06 DIAGNOSIS — E876 Hypokalemia: Secondary | ICD-10-CM | POA: Diagnosis not present

## 2021-01-06 DIAGNOSIS — N138 Other obstructive and reflux uropathy: Secondary | ICD-10-CM | POA: Diagnosis present

## 2021-01-06 DIAGNOSIS — I4891 Unspecified atrial fibrillation: Principal | ICD-10-CM

## 2021-01-06 DIAGNOSIS — R000 Tachycardia, unspecified: Secondary | ICD-10-CM | POA: Diagnosis present

## 2021-01-06 DIAGNOSIS — E785 Hyperlipidemia, unspecified: Secondary | ICD-10-CM | POA: Diagnosis present

## 2021-01-06 DIAGNOSIS — I130 Hypertensive heart and chronic kidney disease with heart failure and stage 1 through stage 4 chronic kidney disease, or unspecified chronic kidney disease: Secondary | ICD-10-CM | POA: Diagnosis not present

## 2021-01-06 DIAGNOSIS — I5022 Chronic systolic (congestive) heart failure: Secondary | ICD-10-CM | POA: Diagnosis not present

## 2021-01-06 DIAGNOSIS — I509 Heart failure, unspecified: Secondary | ICD-10-CM

## 2021-01-06 DIAGNOSIS — I472 Ventricular tachycardia, unspecified: Secondary | ICD-10-CM | POA: Diagnosis present

## 2021-01-06 DIAGNOSIS — I480 Paroxysmal atrial fibrillation: Secondary | ICD-10-CM | POA: Diagnosis not present

## 2021-01-06 DIAGNOSIS — Z95810 Presence of automatic (implantable) cardiac defibrillator: Secondary | ICD-10-CM | POA: Diagnosis not present

## 2021-01-06 DIAGNOSIS — N189 Chronic kidney disease, unspecified: Secondary | ICD-10-CM | POA: Diagnosis not present

## 2021-01-06 DIAGNOSIS — I429 Cardiomyopathy, unspecified: Secondary | ICD-10-CM

## 2021-01-06 DIAGNOSIS — N401 Enlarged prostate with lower urinary tract symptoms: Secondary | ICD-10-CM | POA: Diagnosis present

## 2021-01-06 DIAGNOSIS — N183 Chronic kidney disease, stage 3 unspecified: Secondary | ICD-10-CM

## 2021-01-06 DIAGNOSIS — E7849 Other hyperlipidemia: Secondary | ICD-10-CM | POA: Diagnosis not present

## 2021-01-06 DIAGNOSIS — I482 Chronic atrial fibrillation, unspecified: Secondary | ICD-10-CM | POA: Diagnosis present

## 2021-01-06 LAB — CBC WITH DIFFERENTIAL/PLATELET
Abs Immature Granulocytes: 0.08 10*3/uL — ABNORMAL HIGH (ref 0.00–0.07)
Basophils Absolute: 0 10*3/uL (ref 0.0–0.1)
Basophils Relative: 0 %
Eosinophils Absolute: 0.1 10*3/uL (ref 0.0–0.5)
Eosinophils Relative: 1 %
HCT: 44.9 % (ref 39.0–52.0)
Hemoglobin: 14.6 g/dL (ref 13.0–17.0)
Immature Granulocytes: 1 %
Lymphocytes Relative: 20 %
Lymphs Abs: 1.8 10*3/uL (ref 0.7–4.0)
MCH: 29.4 pg (ref 26.0–34.0)
MCHC: 32.5 g/dL (ref 30.0–36.0)
MCV: 90.5 fL (ref 80.0–100.0)
Monocytes Absolute: 0.7 10*3/uL (ref 0.1–1.0)
Monocytes Relative: 8 %
Neutro Abs: 6.5 10*3/uL (ref 1.7–7.7)
Neutrophils Relative %: 70 %
Platelets: 163 10*3/uL (ref 150–400)
RBC: 4.96 MIL/uL (ref 4.22–5.81)
RDW: 12.6 % (ref 11.5–15.5)
WBC: 9.1 10*3/uL (ref 4.0–10.5)
nRBC: 0 % (ref 0.0–0.2)

## 2021-01-06 LAB — COMPREHENSIVE METABOLIC PANEL
ALT: 13 U/L (ref 0–44)
AST: 21 U/L (ref 15–41)
Albumin: 3 g/dL — ABNORMAL LOW (ref 3.5–5.0)
Alkaline Phosphatase: 68 U/L (ref 38–126)
Anion gap: 7 (ref 5–15)
BUN: 19 mg/dL (ref 8–23)
CO2: 27 mmol/L (ref 22–32)
Calcium: 8.6 mg/dL — ABNORMAL LOW (ref 8.9–10.3)
Chloride: 102 mmol/L (ref 98–111)
Creatinine, Ser: 1.4 mg/dL — ABNORMAL HIGH (ref 0.61–1.24)
GFR, Estimated: 48 mL/min — ABNORMAL LOW (ref >60)
Glucose, Bld: 129 mg/dL — ABNORMAL HIGH (ref 70–99)
Potassium: 3.2 mmol/L — ABNORMAL LOW (ref 3.5–5.1)
Sodium: 136 mmol/L (ref 135–145)
Total Bilirubin: 1.8 mg/dL — ABNORMAL HIGH (ref 0.3–1.2)
Total Protein: 6.4 g/dL — ABNORMAL LOW (ref 6.5–8.1)

## 2021-01-06 LAB — MAGNESIUM: Magnesium: 1.8 mg/dL (ref 1.7–2.4)

## 2021-01-06 LAB — TROPONIN I (HIGH SENSITIVITY)
Troponin I (High Sensitivity): 34 ng/L — ABNORMAL HIGH (ref <18)
Troponin I (High Sensitivity): 33 ng/L — ABNORMAL HIGH (ref <18)

## 2021-01-06 LAB — PHOSPHORUS: Phosphorus: 2.6 mg/dL (ref 2.5–4.6)

## 2021-01-06 LAB — BRAIN NATRIURETIC PEPTIDE: B Natriuretic Peptide: 821.2 pg/mL — ABNORMAL HIGH (ref 0.0–100.0)

## 2021-01-06 MED ORDER — AMIODARONE HCL IN DEXTROSE 360-4.14 MG/200ML-% IV SOLN: 30.0000 mg/h | INTRAVENOUS | Status: DC

## 2021-01-06 MED ORDER — AMIODARONE HCL IN DEXTROSE 360-4.14 MG/200ML-% IV SOLN: 60.0000 mg/h | INTRAVENOUS | Status: DC

## 2021-01-06 MED ORDER — ACETAMINOPHEN 325 MG PO TABS: 650.0000 mg | ORAL_TABLET | ORAL | Status: DC | PRN

## 2021-01-06 MED ORDER — POTASSIUM CHLORIDE 10 MEQ/100ML IV SOLN
10.0000 meq | INTRAVENOUS | Status: AC
  Administered 2021-01-06 (×2): 10 meq via INTRAVENOUS
  Filled 2021-01-06: qty 100

## 2021-01-06 MED ORDER — SIMVASTATIN 10 MG PO TABS
10.0000 mg | ORAL_TABLET | Freq: Every day | ORAL | Status: DC
  Administered 2021-01-06: 22:00:00 10 mg via ORAL
  Filled 2021-01-06: qty 1

## 2021-01-06 MED ORDER — MAGNESIUM SULFATE 2 GM/50ML IV SOLN
2.0000 g | Freq: Once | INTRAVENOUS | Status: AC
  Administered 2021-01-06: 19:00:00 2 g via INTRAVENOUS
  Filled 2021-01-06: qty 50

## 2021-01-06 MED ORDER — METOPROLOL TARTRATE 25 MG PO TABS
25.0000 mg | ORAL_TABLET | Freq: Two times a day (BID) | ORAL | Status: DC
  Administered 2021-01-06: 22:00:00 25 mg via ORAL
  Filled 2021-01-06 (×2): qty 1

## 2021-01-06 MED ORDER — AMIODARONE LOAD VIA INFUSION
150.0000 mg | Freq: Once | INTRAVENOUS | Status: DC
  Filled 2021-01-06: qty 83.34

## 2021-01-06 MED ORDER — POTASSIUM CHLORIDE CRYS ER 20 MEQ PO TBCR
40.0000 meq | EXTENDED_RELEASE_TABLET | Freq: Once | ORAL | Status: AC
  Administered 2021-01-06: 18:00:00 40 meq via ORAL
  Filled 2021-01-06: qty 2

## 2021-01-06 MED ORDER — POTASSIUM CHLORIDE 10 MEQ/100ML IV SOLN
10.0000 meq | Freq: Once | INTRAVENOUS | Status: AC
  Administered 2021-01-06: 23:00:00 10 meq via INTRAVENOUS

## 2021-01-06 MED ORDER — METOPROLOL TARTRATE 25 MG PO TABS
25.0000 mg | ORAL_TABLET | Freq: Once | ORAL | Status: AC
  Administered 2021-01-06: 18:00:00 25 mg via ORAL
  Filled 2021-01-06: qty 1

## 2021-01-06 MED ORDER — ONDANSETRON HCL 4 MG/2ML IJ SOLN: 4.0000 mg | Freq: Four times a day (QID) | INTRAMUSCULAR | Status: DC | PRN

## 2021-01-06 MED ORDER — DILTIAZEM HCL-DEXTROSE 125-5 MG/125ML-% IV SOLN (PREMIX): 5.0000 mg/h | INTRAVENOUS | Status: DC

## 2021-01-06 MED ORDER — METOPROLOL SUCCINATE ER 50 MG PO TB24: 25.0000 mg | ORAL_TABLET | Freq: Once | ORAL | Status: DC

## 2021-01-06 MED ORDER — AMIODARONE HCL 200 MG PO TABS
400.0000 mg | ORAL_TABLET | Freq: Every day | ORAL | Status: DC
  Administered 2021-01-06 – 2021-01-07 (×2): 400 mg via ORAL
  Filled 2021-01-06 (×2): qty 2

## 2021-01-06 MED ORDER — FUROSEMIDE 40 MG PO TABS
20.0000 mg | ORAL_TABLET | Freq: Every day | ORAL | Status: DC
  Administered 2021-01-06 – 2021-01-07 (×2): 20 mg via ORAL
  Filled 2021-01-06 (×2): qty 1

## 2021-01-06 MED ORDER — APIXABAN 2.5 MG PO TABS
2.5000 mg | ORAL_TABLET | Freq: Two times a day (BID) | ORAL | Status: DC
  Administered 2021-01-06 – 2021-01-07 (×2): 2.5 mg via ORAL
  Filled 2021-01-06 (×3): qty 1

## 2021-01-06 NOTE — ED Notes (Signed)
Lab recalled for pt labs

## 2021-01-06 NOTE — Assessment & Plan Note (Addendum)
Wide-complex tachycardia treated with amiodarone by EMS, tachycardic in ER, hypokalemic and was repleted, elevated BNP and troponin likely demand ischemia. Dr. Erma Heritage, ER, spoke with Dr. Boyd Kerbs, cardiology, who recommended p.o. metoprolol and plan beta-blocker as blood pressure tolerates.  Patient was admitted for observation.  Last echocardiogram reported in chart listed in previous cardiology note, done 2018.  We will repeat echo.  CHA2DS2-VASc 4, has bled score 3, initiated Eliquis for anticoagulation/PE prophylaxis while in hospital, consider continuation upon discharge per cardiology, , cardiology has not had him on anticoagulation up until now

## 2021-01-06 NOTE — ED Provider Notes (Signed)
[facility] Emergency Department Provider Note  ____________________________________________   Event Date/Time   First MD Initiated Contact with Patient 01/06/21 1541     (approximate)  I have reviewed the triage vital signs and the nursing notes.   HISTORY  Chief Complaint Tachycardia    HPI Spencer Coleman is a 87 y.o. male with history of hypertension, hyperlipidemia, CKD, here with generalized weakness.  The patient states that he has been generally weak for the last 2 weeks or so.  He is currently recovering from COVID-19.  He was actually seen on 12/26 for weakness and COVID as well as possible AICD firing.  He was treated supportively at that time and sent home.  He states that since then, he has had some generalized weakness but feels like otherwise he is recovering fairly well.  He states that he was shocked again yesterday.  He states that today, he felt somewhat lightheaded when EMS picked him up.  With EMS, the patient was noted to be in what appeared to be wide-complex tachycardia for which she was given amiodarone with improvement.  He now states he feels somewhat better.  He does not believe he is having ongoing fevers.  Denies sputum production.  No nausea or vomiting.  No abdominal pain.    Past Medical History:  Diagnosis Date   CAD (coronary artery disease)    Nonobstructive cardiac disease by cath   CHF (congestive heart failure) (HCC)    nonischemic cardiomyopathy   CKD (chronic kidney disease)    History of kidney stones    Hyperlipidemia    Hypertension    Inguinal hernia    Ventricular tachycardia    s/p ICD    Patient Active Problem List   Diagnosis Date Noted   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   Thrombocytopenia ([place]) 01/04/2018   Closed displaced fracture of coronoid process of left ulna 06/28/2016   Left elbow pain 06/28/2016   Malnutrition of moderate degree 02/08/2015   Preop cardiovascular  exam 02/03/2015   Patellar fracture 01/09/2015   Calculus of kidney 01/09/2015   Congestive heart failure ([place]) 01/09/2015   Patella fracture 01/09/2015   Cardiomyopathy ([place]) 06/06/2013   History of cardiac catheterization 06/06/2013   BP (high blood pressure) 06/06/2013   HLD (hyperlipidemia) 06/06/2013   ACE inhibitor intolerance 06/06/2013   Ventricular tachycardia 04/05/2013   SVT (supraventricular tachycardia) ([place]) 04/05/2013   Automatic implantable cardioverter-defibrillator in situ 03/25/2013   Calculus in bladder 08/07/2012   Gross hematuria 08/07/2012   Incomplete emptying of bladder 08/07/2012   Acute cystitis 10/07/2011   Benign localized prostatic hyperplasia with lower urinary tract symptoms (LUTS) 123XX123   Renal colic 123XX123   Symptom or sign involving genitourinary system 10/07/2011   Symptoms involving urinary system 10/07/2011    Past Surgical History:  Procedure Laterality Date   CARDIAC CATHETERIZATION     CARDIAC DEFIBRILLATOR PLACEMENT  2009   CYSTOSCOPY WITH LITHOLAPAXY N/A 02/21/2018   Procedure: CYSTOSCOPY WITH LITHOLAPAXY;  Surgeon: Billey Co, MD;  Location: [facility] ORS;  Service: Urology;  Laterality: N/A;   ELBOW SURGERY Left    HOLMIUM LASER APPLICATION N/A [id number]   Procedure: HOLMIUM LASER APPLICATION;  Surgeon: Billey Co, MD;  Location: [facility] ORS;  Service: Urology;  Laterality: N/A;   INGUINAL HERNIA REPAIR     LITHOTRIPSY     ORIF left wrist fracture Left    ORIF PATELLA Left 01/10/2015   Procedure: OPEN REDUCTION INTERNAL (  ORIF) FIXATION PATELLA;  Surgeon: Dereck Leep, MD;  Location: [facility] ORS;  Service: Orthopedics;  Laterality: Left;   ORIF PATELLA Left 02/06/2015   Procedure: OPEN REDUCTION INTERNAL (ORIF) FIXATION PATELLA;  Surgeon: Dereck Leep, MD;  Location: [facility] ORS;  Service: Orthopedics;  Laterality: Left;   TRANSURETHRAL RESECTION OF PROSTATE N/A 02/21/2018   Procedure: TRANSURETHRAL RESECTION OF THE PROSTATE  (TURP);  Surgeon: Billey Co, MD;  Location: [facility] ORS;  Service: Urology;  Laterality: N/A;    Prior to Admission medications   Medication Sig Start Date End Date Taking? Authorizing Provider  amiodarone (PACERONE) 400 MG tablet Take 400 mg by mouth daily.   Yes [provider]  furosemide (LASIX) 20 MG tablet Take 20 mg by mouth daily. 11/09/20  Yes [provider]  metoprolol succinate (TOPROL-XL) 25 MG 24 hr tablet Take 25 mg by mouth daily. 11/09/20  Yes [provider]  ondansetron (ZOFRAN-ODT) 4 MG disintegrating tablet Take 1 tablet (4 mg total) by mouth every 8 (eight) hours as needed for nausea or vomiting. 01/04/21  Yes Carrie Mew, MD  simvastatin (ZOCOR) 40 MG tablet Take 40 mg by mouth at bedtime. 11/09/20  Yes [provider]  acetaminophen (TYLENOL) 500 MG tablet Take 2 tablets (1,000 mg total) by mouth every 6 (six) hours. 02/22/18   Billey Co, MD  ibuprofen (ADVIL,MOTRIN) 200 MG tablet Take 400 mg by mouth daily.     [provider]  metoprolol succinate (TOPROL-XL) 50 MG 24 hr tablet Take 50 mg by mouth daily.  Patient not taking: Reported on 01/06/2021 12/21/17   [provider]  molnupiravir EUA (LAGEVRIO) 200 mg CAPS capsule Take 4 capsules (800 mg total) by mouth 2 (two) times daily for 5 days. 01/04/21 01/09/21  Carrie Mew, MD    Allergies Patient has no known allergies.  Family History  Problem Relation Age of Onset   CAD Mother    Kidney cancer Father    Prostate cancer Neg Hx    Bladder Cancer Neg Hx     Social History Social History   Tobacco Use   Smoking status: Never   Smokeless tobacco: Never  Vaping Use   Vaping Use: Never used  Substance Use Topics   Alcohol use: No    Alcohol/week: 0.0 standard drinks   Drug use: No    Review of Systems  Review of Systems  Constitutional:  Positive for fatigue. Negative for chills and fever.  HENT:  Negative for sore throat.    Respiratory:  Positive for cough and shortness of breath.   Cardiovascular:  Positive for palpitations. Negative for chest pain.  Gastrointestinal:  Positive for nausea. Negative for abdominal pain.  Genitourinary:  Negative for flank pain.  Musculoskeletal:  Negative for neck pain.  Skin:  Negative for rash and wound.  Allergic/Immunologic: Negative for immunocompromised state.  Neurological:  Positive for weakness. Negative for numbness.  Hematological:  Does not bruise/bleed easily.  All other systems reviewed and are negative.   ____________________________________________  PHYSICAL EXAM:      VITAL SIGNS: ED Triage Vitals  Enc Vitals Group     BP      Pulse      Resp      Temp      Temp src      SpO2      Weight      Height      Head Circumference      Peak Flow  Pain Score      Pain Loc      Pain Edu?      Excl. in [place]?      Physical Exam Vitals and nursing note reviewed.  Constitutional:      General: He is not in acute distress.    Appearance: He is well-developed.  HENT:     Head: Normocephalic and atraumatic.  Eyes:     Conjunctiva/sclera: Conjunctivae normal.  Cardiovascular:     Rate and Rhythm: Regular rhythm. Tachycardia present.     Heart sounds: Normal heart sounds. No murmur heard.   No friction rub.  Pulmonary:     Effort: Pulmonary effort is normal. No respiratory distress.     Breath sounds: Rales (Bibasilar) present. No wheezing.  Abdominal:     General: There is no distension.     Palpations: Abdomen is soft.     Tenderness: There is no abdominal tenderness.  Musculoskeletal:     Cervical back: Neck supple.  Skin:    General: Skin is warm.     Capillary Refill: Capillary refill takes less than 2 seconds.  Neurological:     Mental Status: He is alert and oriented to person, place, and time.     Motor: No abnormal muscle tone.      ____________________________________________   LABS (all labs ordered are listed, but only  abnormal results are displayed)  Labs Reviewed  CBC WITH DIFFERENTIAL/PLATELET - Abnormal; Notable for the following components:      Result Value   Abs Immature Granulocytes 0.08 (*)    All other components within normal limits  COMPREHENSIVE METABOLIC PANEL - Abnormal; Notable for the following components:   Potassium 3.2 (*)    Glucose, Bld 129 (*)    Creatinine, Ser 1.40 (*)    Calcium 8.6 (*)    Total Protein 6.4 (*)    Albumin 3.0 (*)    Total Bilirubin 1.8 (*)    GFR, Estimated 48 (*)    All other components within normal limits  BRAIN NATRIURETIC PEPTIDE - Abnormal; Notable for the following components:   B Natriuretic Peptide 821.2 (*)    All other components within normal limits  TROPONIN I (HIGH SENSITIVITY) - Abnormal; Notable for the following components:   Troponin I (High Sensitivity) 34 (*)    All other components within normal limits  MAGNESIUM  PHOSPHORUS  TROPONIN I (HIGH SENSITIVITY)    ____________________________________________  EKG: Ventricular paced rhythm with PVCs.  Ventricular rate 114.  QTc 532.  Appears largely unchanged from previous. ________________________________________  RADIOLOGY All imaging, including plain films, CT scans, and ultrasounds, independently reviewed by me, and interpretations confirmed via formal radiology reads.  ED MD interpretation:   Chest x-ray: Clear  Official radiology report(s): DG Chest 2 View  Result Date: 01/06/2021 CLINICAL DATA:  Shortness of breath and tachycardia. Recent COVID diagnosis. EXAM: CHEST - 2 VIEW COMPARISON:  01/04/2021 FINDINGS: Elevated right hemidiaphragm with stable bandlike density likely related to atelectasis/volume loss. Heart size within normal limits. AICD noted. Atherosclerotic calcification of the aortic arch and descending thoracic aorta. No new airspace opacity is identified. IMPRESSION: 1. Stable right hemidiaphragmatic elevation with bandlike scarring or atelectasis at the right  lung base. 2. AICD noted. 3. No new or progressive findings. Electronically Signed   By: Van Clines M.D.   On: 01/06/2021 16:26    ____________________________________________  PROCEDURES   Procedure(s) performed (including Critical Care):  .1-3 Lead EKG Interpretation Performed by: Duffy Bruce, MD  Authorized by: Shaune Pollack, MD     Interpretation: non-specific     ECG rate:  90-120s   ECG rate assessment: tachycardic     Rhythm: atrial fibrillation     Ectopy: none     Conduction: normal   Comments:     Indication: SOB, weakness  ____________________________________________  INITIAL IMPRESSION / MDM / ASSESSMENT AND PLAN / ED COURSE  As part of my medical decision making, I reviewed the following data within the electronic MEDICAL RECORD NUMBER Nursing notes reviewed and incorporated, Old chart reviewed, Notes from prior ED visits, and [place] Controlled Substance Database       *RHILEY SOLEM was evaluated in Emergency Department on 01/06/2021 for the symptoms described in the history of present illness. He was evaluated in the context of the global COVID-19 pandemic, which necessitated consideration that the patient might be at risk for infection with the SARS-CoV-2 virus that causes COVID-19. Institutional protocols and algorithms that pertain to the evaluation of patients at risk for COVID-19 are in a state of rapid change based on information released by regulatory bodies including the CDC and federal and state organizations. These policies and algorithms were followed during the patient's care in the ED.  Some ED evaluations and interventions may be delayed as a result of limited staffing during the pandemic.*     Medical Decision Making:  87 yo M here with dizziness, weakness in setting of COVID-19. Arrives in what appears to be AFib RVR. Interrogated [employer] PM which showed episodes of VTach/Fib 2 days ago, and ATach episode this afternoon. He improved w/ amio with  EMS, but now appears to be tachycardic again. D/w Dr. Gwen Pounds of Cardiology, will give metop PO and plan to beta block as BP tolerates. Pt is on amio 400 qd at baseline. States he ahs been adherent. Otherwise, labs show hypokalemia which will be repleted.  Troponin slightly elevated which I suspect is demand related to his tachycardia.  No significant leukocytosis.  BNP moderately elevated though he does not appear overtly hypervolemic on exam and chest x-ray shows no evidence of edema on my review.  Will admit to medicine.  ____________________________________________  FINAL CLINICAL IMPRESSION(S) / ED DIAGNOSES  Final diagnoses:  Atrial fibrillation with RVR (HCC)  Hypokalemia  COVID-19     MEDICATIONS GIVEN DURING THIS VISIT:  Medications  potassium chloride 10 mEq in 100 mL IVPB (has no administration in time range)  magnesium sulfate IVPB 2 g 50 mL (2 g Intravenous New Bag/Given 01/06/21 1833)  potassium chloride SA (KLOR-CON M) CR tablet 40 mEq (40 mEq Oral Given 01/06/21 1828)  metoprolol tartrate (LOPRESSOR) tablet 25 mg (25 mg Oral Given 01/06/21 1828)     ED Discharge Orders     None        Note:  This document was prepared using Dragon voice recognition software and may include unintentional dictation errors.   Shaune Pollack, MD 01/06/21 [phone]

## 2021-01-06 NOTE — H&P (Signed)
RAVIS HERNE is an 87 y.o. male.   Chief Complaint:  Chief Complaint  Patient presents with   Tachycardia   HPI:  EVA VALLEE is a 87 y.o. male with history of hypertension, hyperlipidemia, CKD, here with generalized weakness.  The patient states that he has been generally weak for the last 2 weeks or so.  He is currently recovering from COVID-19.  He was actually seen on 12/26 for weakness and COVID as well as possible AICD firing.  He was treated supportively at that time and sent home.   He states that since then, he has had some generalized weakness but feels like otherwise he is recovering fairly well.  He states that he was shocked again yesterday.  He states that today, he felt somewhat lightheaded when EMS picked him up.  With EMS, the patient was noted to be in what appeared to be wide-complex tachycardia for which she was given amiodarone with improvement.  He now states he feels somewhat better.  He does not believe he is having ongoing fevers.  Denies sputum production.  No nausea or vomiting.  No abdominal pain.  Hospital course: 01/06/21: With EMS: wide-complex tachycardia for which she was given amiodarone with improvement. In ED: EKG showed ventricular paced rhythm with PVCs, rate 114, QTc 532 appearing unchanged from previous EKG.  Slight hypokalemia with potassium 3.2, creatinine 1.4, BN P8 121, troponin 34 thought to be due to demand ischemia.  CXR showed stable right hemidiaphragmatic elevation with scarring/atelectasis, AICD, no new findings.  Received potassium, magnesium, metoprolol tartrate 25 mg p.o.  Dr. Erma Heritage, ER, spoke with Dr. Gwen Pounds, cardiology, who recommended p.o. metoprolol and plan beta-blocker as blood pressure tolerates.  Patient was admitted for observation    ASSESSMENT/PLAN:   Hx subclinical atrial fibrillation on ICD interrogation, concern for Afib/RVR Congestive heart failure Automatic implantable cardioverter-defibrillator in situ Cardiomyopathy   Essential hypertension HLD (hyperlipidemia) Ventricular tachycardia Wide-complex tachycardia treated with amiodarone by EMS, tachycardic in ER, hypokalemic and was repleted, elevated BNP and troponin likely demand ischemia. Dr. Erma Heritage, ER, spoke with Dr. Boyd Kerbs, cardiology, who recommended p.o. metoprolol and plan beta-blocker as blood pressure tolerates.  Patient was admitted for observation.  Last echocardiogram reported in chart listed in previous cardiology note, done 2018.  We will repeat echo.  CHA2DS2-VASc 4, has bled score 3, initiated Eliquis for anticoagulation/PE prophylaxis while in hospital, consider continuation upon discharge per cardiology, cardiology has not had him on anticoagulation previously      Past Medical History:  Diagnosis Date   CAD (coronary artery disease)    Nonobstructive cardiac disease by cath   CHF (congestive heart failure) (HCC)    nonischemic cardiomyopathy   CKD (chronic kidney disease)    History of kidney stones    Hyperlipidemia    Hypertension    Inguinal hernia    Patella fracture 01/09/2015   Renal colic 10/07/2011   Ventricular tachycardia    s/p ICD    Past Surgical History:  Procedure Laterality Date   CARDIAC CATHETERIZATION     CARDIAC DEFIBRILLATOR PLACEMENT  2009   CYSTOSCOPY WITH LITHOLAPAXY N/A 02/21/2018   Procedure: CYSTOSCOPY WITH LITHOLAPAXY;  Surgeon: Sondra Come, MD;  Location: [facility] ORS;  Service: Urology;  Laterality: N/A;   ELBOW SURGERY Left    HOLMIUM LASER APPLICATION N/A 02/21/2018   Procedure: HOLMIUM LASER APPLICATION;  Surgeon: Sondra Come, MD;  Location: [facility] OR[facility];  Service: Urology;  Laterality: N/A;   INGUINAL HERNIA REPAIR  LITHOTRIPSY     ORIF left wrist fracture Left    ORIF PATELLA Left 01/10/2015   Procedure: OPEN REDUCTION INTERNAL (ORIF) FIXATION PATELLA;  Surgeon: Donato Heinz, MD;  Location: [facility] OR[facility];  Service: Orthopedics;  Laterality: Left;   ORIF PATELLA Left 02/06/2015    Procedure: OPEN REDUCTION INTERNAL (ORIF) FIXATION PATELLA;  Surgeon: Donato Heinz, MD;  Location: [facility] OR[facility];  Service: Orthopedics;  Laterality: Left;   TRANSURETHRAL RESECTION OF PROSTATE N/A 02/21/2018   Procedure: TRANSURETHRAL RESECTION OF THE PROSTATE (TURP);  Surgeon: Sondra Come, MD;  Location: [facility] OR[facility];  Service: Urology;  Laterality: N/A;    Family History  Problem Relation Age of Onset   CAD Mother    Kidney cancer Father    Prostate cancer Neg Hx    Bladder Cancer Neg Hx    Social History:  reports that he has never smoked. He has never used smokeless tobacco. He reports that he does not drink alcohol and does not use drugs.  Allergies: No Known Allergies  (Not in a hospital admission)   Results for orders placed or performed during the hospital encounter of 01/06/21 (from the past 48 hour(s))  CBC with Differential     Status: Abnormal   Collection Time: 01/06/21  3:42 PM  Result Value Ref Range   WBC 9.1 4.0 - 10.5 K/uL   RBC 4.96 4.22 - 5.81 MIL/uL   Hemoglobin 14.6 13.0 - 17.0 g/dL   HCT 88.9 16.9 - 45.0 %   MCV 90.5 80.0 - 100.0 fL   MCH 29.4 26.0 - 34.0 pg   MCHC 32.5 30.0 - 36.0 g/dL   RDW 38.8 82.8 - 00.3 %   Platelets 163 150 - 400 K/uL   nRBC 0.0 0.0 - 0.2 %   Neutrophils Relative % 70 %   Neutro Abs 6.5 1.7 - 7.7 K/uL   Lymphocytes Relative 20 %   Lymphs Abs 1.8 0.7 - 4.0 K/uL   Monocytes Relative 8 %   Monocytes Absolute 0.7 0.1 - 1.0 K/uL   Eosinophils Relative 1 %   Eosinophils Absolute 0.1 0.0 - 0.5 K/uL   Basophils Relative 0 %   Basophils Absolute 0.0 0.0 - 0.1 K/uL   Immature Granulocytes 1 %   Abs Immature Granulocytes 0.08 (H) 0.00 - 0.07 K/uL    Comment: Performed at [facility], [address] [place], [place] [address]  Comprehensive metabolic panel     Status: Abnormal   Collection Time: 01/06/21  3:42 PM  Result Value Ref Range   Sodium 136 135 - 145 mmol/L   Potassium 3.2 (L) 3.5 - 5.1 mmol/L   Chloride 102 98 -  111 mmol/L   CO2 27 22 - 32 mmol/L   Glucose, Bld 129 (H) 70 - 99 mg/dL    Comment: Glucose reference range applies only to samples taken after fasting for at least 8 hours.   BUN 19 8 - 23 mg/dL   Creatinine, Ser 1.50 (H) 0.61 - 1.24 mg/dL   Calcium 8.6 (L) 8.9 - 10.3 mg/dL   Total Protein 6.4 (L) 6.5 - 8.1 g/dL   Albumin 3.0 (L) 3.5 - 5.0 g/dL   AST 21 15 - 41 U/L   ALT 13 0 - 44 U/L   Alkaline Phosphatase 68 38 - 126 U/L   Total Bilirubin 1.8 (H) 0.3 - 1.2 mg/dL   GFR, Estimated 48 (L) >60 mL/min    Comment: (NOTE) Calculated using the CKD-EPI Creatinine  Equation (2021)    Anion gap 7 5 - 15    Comment: Performed at [facility], [address] [place], [place] [address]  Magnesium     Status: None   Collection Time: 01/06/21  3:42 PM  Result Value Ref Range   Magnesium 1.8 1.7 - 2.4 mg/dL    Comment: Performed at [facility], [address] [place], [place] [address]  Phosphorus     Status: None   Collection Time: 01/06/21  3:42 PM  Result Value Ref Range   Phosphorus 2.6 2.5 - 4.6 mg/dL    Comment: Performed at [facility], [address] [place], [place] [address]  Troponin I (High Sensitivity)     Status: Abnormal   Collection Time: 01/06/21  3:42 PM  Result Value Ref Range   Troponin I (High Sensitivity) 34 (H) <18 ng/L    Comment: (NOTE) Elevated high sensitivity troponin I (hsTnI) values and significant  changes across serial measurements may suggest ACS but many other  chronic and acute conditions are known to elevate hsTnI results.  Refer to the "Links" section for chest pain algorithms and additional  guidance. Performed at [facility], [address] [place], [place] [address]   Brain natriuretic peptide     Status: Abnormal   Collection Time: 01/06/21  3:43 PM  Result Value Ref Range   B Natriuretic Peptide 821.2 (H) 0.0 - 100.0 pg/mL    Comment: Performed at [facility], [address]  [place], [place] [address]   DG Chest 2 View  Result Date: 01/06/2021 CLINICAL DATA:  Shortness of breath and tachycardia. Recent COVID diagnosis. EXAM: CHEST - 2 VIEW COMPARISON:  01/04/2021 FINDINGS: Elevated right hemidiaphragm with stable bandlike density likely related to atelectasis/volume loss. Heart size within normal limits. AICD noted. Atherosclerotic calcification of the aortic arch and descending thoracic aorta. No new airspace opacity is identified. IMPRESSION: 1. Stable right hemidiaphragmatic elevation with bandlike scarring or atelectasis at the right lung base. 2. AICD noted. 3. No new or progressive findings. Electronically Signed   By: Gaylyn Rong M.D.   On: 01/06/2021 16:26    Review of Systems  Constitutional:  Positive for fatigue. Negative for chills, diaphoresis and fever.  HENT:  Negative for postnasal drip, sore throat and trouble swallowing.   Respiratory:  Positive for cough. Negative for apnea and chest tightness.   Cardiovascular:  Positive for palpitations. Negative for chest pain.  Gastrointestinal:  Negative for abdominal pain.  Musculoskeletal:  Negative for arthralgias.  Neurological:  Positive for light-headedness. Negative for syncope.  Psychiatric/Behavioral:  Negative for sleep disturbance. The patient is not nervous/anxious.    Blood pressure 117/63, pulse (!) 106, temperature 98.3 F (36.8 C), temperature source Oral, resp. rate (!) 22, SpO2 97 %. Physical Exam Constitutional:      Appearance: Normal appearance.  HENT:     Head: Normocephalic and atraumatic.  Cardiovascular:     Rate and Rhythm: Normal rate and regular rhythm.  Pulmonary:     Effort: Pulmonary effort is normal. No respiratory distress.     Comments: Coarse breath sounds at bases  Abdominal:     General: Abdomen is flat.     Palpations: Abdomen is soft.  Musculoskeletal:        General: No swelling or deformity. Normal range of motion.     Cervical back: Normal range of  motion.     Left lower leg: No edema.  Skin:  General: Skin is warm and dry.  Neurological:     General: No focal deficit present.     Mental Status: He is alert and oriented to person, place, and time.  Psychiatric:        Mood and Affect: Mood normal.        Behavior: Behavior normal.        Thought Content: Thought content normal.        Judgment: Judgment normal.     Sunnie Nielsen, DO 01/06/2021, 8:07 PM

## 2021-01-06 NOTE — ED Notes (Signed)
Lab called to draw pt labs

## 2021-01-06 NOTE — ED Notes (Signed)
Pt moved to 5h for monitoring.  Report off to [employer]

## 2021-01-06 NOTE — ED Triage Notes (Signed)
Pt comes via EMs with c/o tachycardia and covid symptoms. Pt was dx with covid few days back. EMS reports upon their arrival pt was in 160s for HR. Pt does have pacemaker and recently evaluated at ED for pacemaker shocking him.  Pt given 150 amino and converted back.  Pt given 500 LR, 18 g in RAC  Pt is A*OX4

## 2021-01-06 NOTE — ED Notes (Signed)
[employer] interrogated without diff.  Pt alert  pt in hallway bed.  Pt is covid positive.  Iv in place.

## 2021-01-07 DIAGNOSIS — I480 Paroxysmal atrial fibrillation: Secondary | ICD-10-CM | POA: Diagnosis not present

## 2021-01-07 DIAGNOSIS — I5022 Chronic systolic (congestive) heart failure: Secondary | ICD-10-CM

## 2021-01-07 DIAGNOSIS — E7849 Other hyperlipidemia: Secondary | ICD-10-CM | POA: Diagnosis not present

## 2021-01-07 LAB — MAGNESIUM: Magnesium: 2.4 mg/dL (ref 1.7–2.4)

## 2021-01-07 LAB — CBC
HCT: 41.8 % (ref 39.0–52.0)
Hemoglobin: 13.9 g/dL (ref 13.0–17.0)
MCH: 30.3 pg (ref 26.0–34.0)
MCHC: 33.3 g/dL (ref 30.0–36.0)
MCV: 91.3 fL (ref 80.0–100.0)
Platelets: 180 10*3/uL (ref 150–400)
RBC: 4.58 MIL/uL (ref 4.22–5.81)
RDW: 12.7 % (ref 11.5–15.5)
WBC: 9 10*3/uL (ref 4.0–10.5)
nRBC: 0 % (ref 0.0–0.2)

## 2021-01-07 LAB — BASIC METABOLIC PANEL
Anion gap: 10 (ref 5–15)
BUN: 18 mg/dL (ref 8–23)
CO2: 22 mmol/L (ref 22–32)
Calcium: 8.5 mg/dL — ABNORMAL LOW (ref 8.9–10.3)
Chloride: 104 mmol/L (ref 98–111)
Creatinine, Ser: 1.21 mg/dL (ref 0.61–1.24)
GFR, Estimated: 58 mL/min — ABNORMAL LOW (ref >60)
Glucose, Bld: 111 mg/dL — ABNORMAL HIGH (ref 70–99)
Potassium: 4.5 mmol/L (ref 3.5–5.1)
Sodium: 136 mmol/L (ref 135–145)

## 2021-01-07 LAB — TSH: TSH: 0.702 u[IU]/mL (ref 0.350–4.500)

## 2021-01-07 MED ORDER — APIXABAN 2.5 MG PO TABS: 2.5000 mg | ORAL_TABLET | Freq: Two times a day (BID) | ORAL | 0 refills | Status: DC

## 2021-01-07 NOTE — ED Notes (Signed)
Pt's SIL called stating they are not able to come get pt at this time

## 2021-01-07 NOTE — ED Notes (Signed)
Pt wandering in hall, family refusing to come get pt at this time.

## 2021-01-07 NOTE — Progress Notes (Signed)
PT Cancellation Note  Patient Details Name: Spencer Coleman MRN: [id number] DOB: 02-02-1930   Cancelled Treatment:    Reason Eval/Treat Not Completed: PT screened, no needs identified, will sign off. PT witnessed pt up at nursing station, drinking coffee and speaking with RN. RN stated she has seen him up and moving and he is doing well, pt/RN have no mobility concerns at this time. Provided with RW since he uses one at base line. PT to sign off, please re-consult if acute needs arise.   Olga Coaster PT, DPT 10:41 AM,01/07/21

## 2021-01-07 NOTE — TOC Progression Note (Signed)
Transition of Care [facility]) - Progression Note    Patient Details  Name: Spencer Coleman MRN: [id number] Date of Birth: Sep 14, 1930  [facility] CM/SW Contact  Allayne Butcher, RN Phone Number: 01/07/2021, 3:22 PM  Clinical Narrative:    [facility] has acquired a ride for patient.  They should arrive at the ER around 4 to 4:14 pm.  Nursing updated and [facility] given nurses number to contact once they arrive.         Expected Discharge Plan and Services           Expected Discharge Date: 01/07/21                                     Social Determinants of Health (SDOH) Interventions    Readmission Risk Interventions No flowsheet data found.

## 2021-01-07 NOTE — ED Notes (Signed)
Pt given sandwich tray and drink. 

## 2021-01-07 NOTE — Consult Note (Signed)
[facility] Consultation Note  Patient ID: Spencer Coleman, MRN: [id number], DOB/AGE: 15-Dec-1930 87 y.o. Admit date: 01/06/2021   Date of Consult: 01/07/2021 Primary Physician: Tracie Harrier, MD Primary Cardiologist: Paraschos  Chief Complaint:  Chief Complaint  Patient presents with   Tachycardia   Reason for Consult:  Weakness atrial fibrillation  HPI: 87 y.o. male with known coronary artery disease without evidence of significant previous bypass surgery and/or stenting and nonischemic cardiomyopathy with defibrillator pacer placement in the past.  The patient has had known hypertension hyperlipidemia and appropriately current medical regimen for further risk reduction issues.  Patient has had known chronic kidney disease with glomerular filtration rate of 48 and recently has felt weak fatigued.  When seen by the EMS patient apparently had atrial fibrillation with rapid ventricular rate for which the weakness was likely part of.  He was given intravenous amiodarone and has converted into normal sinus rhythm and felt better.  He was also placed on oral amiodarone for maintenance of normal sinus rhythm with an increased dose of metoprolol with better heart rate control at this time.  Currently it appears she is in normal sinus rhythm with ventricular pacing.  Troponin has been 34/33 with no evidence of myocardial infarction or acute coronary syndrome.  There is no current evidence of congestive heart failure.  The patient now is feeling better  Past Medical History:  Diagnosis Date   CAD (coronary artery disease)    Nonobstructive cardiac disease by cath   CHF (congestive heart failure) (HCC)    nonischemic cardiomyopathy   CKD (chronic kidney disease)    History of kidney stones    Hyperlipidemia    Hypertension    Inguinal hernia    Patella fracture [id number]   Renal colic Q000111Q   Ventricular tachycardia    s/p ICD      Surgical History:  Past Surgical  History:  Procedure Laterality Date   CARDIAC CATHETERIZATION     CARDIAC DEFIBRILLATOR PLACEMENT  2009   CYSTOSCOPY WITH LITHOLAPAXY N/A 02/21/2018   Procedure: CYSTOSCOPY WITH LITHOLAPAXY;  Surgeon: Billey Co, MD;  Location: [facility] ORS;  Service: Urology;  Laterality: N/A;   ELBOW SURGERY Left    HOLMIUM LASER APPLICATION N/A [id number]   Procedure: HOLMIUM LASER APPLICATION;  Surgeon: Billey Co, MD;  Location: [facility] ORS;  Service: Urology;  Laterality: N/A;   INGUINAL HERNIA REPAIR     LITHOTRIPSY     ORIF left wrist fracture Left    ORIF PATELLA Left 01/10/2015   Procedure: OPEN REDUCTION INTERNAL (ORIF) FIXATION PATELLA;  Surgeon: Dereck Leep, MD;  Location: [facility] ORS;  Service: Orthopedics;  Laterality: Left;   ORIF PATELLA Left 02/06/2015   Procedure: OPEN REDUCTION INTERNAL (ORIF) FIXATION PATELLA;  Surgeon: Dereck Leep, MD;  Location: [facility] ORS;  Service: Orthopedics;  Laterality: Left;   TRANSURETHRAL RESECTION OF PROSTATE N/A 02/21/2018   Procedure: TRANSURETHRAL RESECTION OF THE PROSTATE (TURP);  Surgeon: Billey Co, MD;  Location: [facility] ORS;  Service: Urology;  Laterality: N/A;     Home Meds: Prior to Admission medications   Medication Sig Start Date End Date Taking? Authorizing Provider  amiodarone (PACERONE) 400 MG tablet Take 400 mg by mouth daily.   Yes [provider]  furosemide (LASIX) 20 MG tablet Take 20 mg by mouth daily. 11/09/20  Yes [provider]  metoprolol succinate (TOPROL-XL) 25 MG 24 hr tablet Take 25 mg by mouth daily. 11/09/20  Yes [provider]  ondansetron (ZOFRAN-ODT) 4 MG disintegrating tablet Take 1 tablet (4 mg total) by mouth every 8 (eight) hours as needed for nausea or vomiting. 01/04/21  Yes Sharman Cheek, MD  simvastatin (ZOCOR) 40 MG tablet Take 40 mg by mouth at bedtime. 11/09/20  Yes [provider]  acetaminophen (TYLENOL) 500 MG tablet Take 2 tablets (1,000 mg total) by mouth every  6 (six) hours. 02/22/18   Sondra Come, MD  ibuprofen (ADVIL,MOTRIN) 200 MG tablet Take 400 mg by mouth daily.     [provider]  metoprolol succinate (TOPROL-XL) 50 MG 24 hr tablet Take 50 mg by mouth daily.  Patient not taking: Reported on 01/06/2021 12/21/17   [provider]    Inpatient Medications:   amiodarone  400 mg Oral Daily   apixaban  2.5 mg Oral BID   furosemide  20 mg Oral Daily   metoprolol tartrate  25 mg Oral BID   simvastatin  10 mg Oral QHS     Allergies: No Known Allergies  Social History   Socioeconomic History   Marital status: Single    Spouse name: Not on file   Number of children: Not on file   Years of education: Not on file   Highest education level: Not on file  Occupational History   Occupation: retired  Tobacco Use   Smoking status: Never   Smokeless tobacco: Never  Vaping Use   Vaping Use: Never used  Substance and Sexual Activity   Alcohol use: No    Alcohol/week: 0.0 standard drinks   Drug use: No   Sexual activity: Not on file  Other Topics Concern   Not on file  Social History Narrative   Lives at home. Independent, no assisted devices at baseline   Social Determinants of Health   Financial Resource Strain: Not on file  Food Insecurity: Not on file  Transportation Needs: Not on file  Physical Activity: Not on file  Stress: Not on file  Social Connections: Not on file  Intimate Partner Violence: Not on file     Family History  Problem Relation Age of Onset   CAD Mother    Kidney cancer Father    Prostate cancer Neg Hx    Bladder Cancer Neg Hx      Review of Systems Positive for shortness of breath weakness Negative for: General:  chills, fever, night sweats or weight changes.  Cardiovascular: PND orthopnea syncope dizziness  Dermatological skin lesions rashes Respiratory: Cough congestion Urologic: Frequent urination urination at night and hematuria Abdominal: negative for nausea, vomiting,  diarrhea, bright red blood per rectum, melena, or hematemesis Neurologic: negative for visual changes, and/or hearing changes  All other systems reviewed and are otherwise negative except as noted above.  Labs: No results for input(s): CKTOTAL, CKMB, TROPONINI in the last 72 hours. Lab Results  Component Value Date   WBC 9.1 01/06/2021   HGB 14.6 01/06/2021   HCT 44.9 01/06/2021   MCV 90.5 01/06/2021   PLT 163 01/06/2021    Recent Labs  Lab 01/06/21 1542  NA 136  K 3.2*  CL 102  CO2 27  BUN 19  CREATININE 1.40*  CALCIUM 8.6*  PROT 6.4*  BILITOT 1.8*  ALKPHOS 68  ALT 13  AST 21  GLUCOSE 129*   No results found for: CHOL, HDL, LDLCALC, TRIG No results found for: DDIMER  Radiology/Studies:  DG Chest 2 View  Result Date: 01/06/2021 CLINICAL DATA:  Shortness of breath and tachycardia.  Recent COVID diagnosis. EXAM: CHEST - 2 VIEW COMPARISON:  01/04/2021 FINDINGS: Elevated right hemidiaphragm with stable bandlike density likely related to atelectasis/volume loss. Heart size within normal limits. AICD noted. Atherosclerotic calcification of the aortic arch and descending thoracic aorta. No new airspace opacity is identified. IMPRESSION: 1. Stable right hemidiaphragmatic elevation with bandlike scarring or atelectasis at the right lung base. 2. AICD noted. 3. No new or progressive findings. Electronically Signed   By: Gaylyn Rong M.D.   On: 01/06/2021 16:26   DG Chest Portable 1 View  Result Date: 01/04/2021 CLINICAL DATA:  Nausea for 5 days, chest pain EXAM: PORTABLE CHEST 1 VIEW COMPARISON:  08/18/2018 FINDINGS: Single frontal view of the chest demonstrates stable AICD. Cardiac silhouette is unremarkable. No airspace disease, effusion, or pneumothorax. Chronic elevation right hemidiaphragm. No acute bony abnormalities. IMPRESSION: 1. Stable chest, no acute process. Electronically Signed   By: Sharlet Salina M.D.   On: 01/04/2021 15:41    EKG: Ventricular  pacing  Weights: There were no vitals filed for this visit.   Physical Exam: Blood pressure (!) 115/51, pulse 74, temperature 98.3 F (36.8 C), temperature source Oral, resp. rate 16, SpO2 94 %. There is no height or weight on file to calculate BMI. General: Well developed, well nourished, in no acute distress. Head eyes ears nose throat: Normocephalic, atraumatic, sclera non-icteric, no xanthomas, nares are without discharge. No apparent thyromegaly and/or mass  Lungs: Normal respiratory effort.  Few wheezes, no rales, some rhonchi.  Heart: RRR with normal S1 S2. no murmur gallop, no rub, PMI is normal size and placement, carotid upstroke normal without bruit, jugular venous pressure is normal Abdomen: Soft, non-tender, non-distended with normoactive bowel sounds. No hepatomegaly. No rebound/guarding. No obvious abdominal masses. Abdominal aorta is normal size without bruit Extremities: No edema. no cyanosis, no clubbing, no ulcers  Peripheral : 2+ bilateral upper extremity pulses, 2+ bilateral femoral pulses, 2+ bilateral dorsal pedal pulse Neuro: Alert and oriented. No facial asymmetry. No focal deficit. Moves all extremities spontaneously. Musculoskeletal: Normal muscle tone without kyphosis Psych:  Responds to questions appropriately with a normal affect.    Assessment: 87 year old male with known nonischemic dilated cardiomyopathy with chronic systolic dysfunction congestive heart failure chronic kidney disease stage III hypertension hyperlipidemia with acute atrial fibrillation with rapid ventricular rate now improved into normal sinus rhythm with amiodarone and feeling much better without evidence of acute coronary syndrome or myocardial infarction  Plan: 1.  Continuation of amiodarone for maintenance of normal sinus rhythm and risk reduction in episodes of ventricular tachycardia which she has a history 2.  No further cardiac diagnostics necessary at this time due to no evidence of  congestive heart failure or myocardial infarction 3.  Continue supportive care for weakness and other noncardiac symptoms 4.  Continuation of medication management including beta-blocker ACE inhibitor for chronic systolic dysfunction congestive heart failure 5.  Begin ambulation and follow-up for improvements of symptoms and okay for discharge to home on current medical regimen including amiodarone, furosemide, Eliquis, metoprolol with adjustments as an outpatient.   Signed, Lamar Blinks M.D. [facility] Cardiology 01/07/2021, 7:15 AM

## 2021-01-07 NOTE — ED Notes (Signed)
Pt refusing vitals at this time.

## 2021-01-07 NOTE — ED Notes (Signed)
Pt OOB to restroom with steady gait, pt escorted, pt states he usually uses walker at home, pt standby assist. NAD noted.

## 2021-01-07 NOTE — Progress Notes (Signed)
OT Cancellation Note  Patient Details Name: Spencer Coleman MRN: [id number] DOB: 16-Jan-1930   Cancelled Treatment:    Reason Eval/Treat Not Completed: OT screened, no needs identified, will sign off. Order received, chart reviewed. Per PT/RN, pt independent, ambulating around ED, fully dressed, no deficits appreciated and no skilled OT needs identified. Will sign off. Please re-consult if additional needs arise.   Arman Filter., MPH, MS, OTR/L ascom [phone] 01/07/21, 10:39 AM

## 2021-01-07 NOTE — Discharge Summary (Signed)
Physician Discharge Summary  Spencer Coleman JEH:[id number] DOB: February 12, 1930 DOA: 01/06/2021  PCP: Barbette Reichmann, MD  Admit date: 01/06/2021 Discharge date: 01/07/2021  Admitted From: home  Disposition:  home   Recommendations for Outpatient Follow-up:  Follow up with PCP in 1-2 weeks F/u w/ cardio, Dr. Darrold Junker, w/in 1 week   Home Health: no  Equipment/Devices:  Discharge Condition: stable  CODE STATUS: full Diet recommendation: Heart Healthy   Brief/Interim Summary: HPI was taken from Dr. Lyn Hollingshead: Spencer Coleman is a 87 y.o. male with history of hypertension, hyperlipidemia, CKD, here with generalized weakness.  The patient states that he has been generally weak for the last 2 weeks or so.  He is currently recovering from COVID-19.  He was actually seen on 12/26 for weakness and COVID as well as possible AICD firing.  He was treated supportively at that time and sent home.   He states that since then, he has had some generalized weakness but feels like otherwise he is recovering fairly well.  He states that he was shocked again yesterday.  He states that today, he felt somewhat lightheaded when EMS picked him up.  With EMS, the patient was noted to be in what appeared to be wide-complex tachycardia for which she was given amiodarone with improvement.  He now states he feels somewhat better.  He does not believe he is having ongoing fevers.  Denies sputum production.  No nausea or vomiting.  No abdominal pain.   Hospital course: 01/06/21: With EMS: wide-complex tachycardia for which she was given amiodarone with improvement. In ED: EKG showed ventricular paced rhythm with PVCs, rate 114, QTc 532 appearing unchanged from previous EKG.  Slight hypokalemia with potassium 3.2, creatinine 1.4, BN P8 121, troponin 34 thought to be due to demand ischemia.  CXR showed stable right hemidiaphragmatic elevation with scarring/atelectasis, AICD, no new findings.  Received potassium, magnesium,  metoprolol tartrate 25 mg p.o.  Dr. Erma Heritage, ER, spoke with Dr. Gwen Pounds, cardiology, who recommended p.o. metoprolol and plan beta-blocker as blood pressure tolerates.  Patient was admitted for observation  As per Dr. Mayford Knife 01/07/21: Pt had converted back to sinus rhythm prior to d/c. Pt will continue on amiodarone, metoprolol, lasix, & eliquis as per cardio. No further cardiac work-up was recommended inpatient as per cardio. Pt will w/ cardio, Dr. Darrold Junker, within 1 week. Pt verbalized his understanding.   Discharge Diagnoses:  Principal Problem:   Hx subclinical atrial fibrillation on ICD interrogation, concern for Afib/RVR  Active Problems:   Automatic implantable cardioverter-defibrillator in situ   Cardiomyopathy (HCC)   Congestive heart failure (HCC)   Essential hypertension   HLD (hyperlipidemia)   Ventricular tachycardia   BPH with obstruction/lower urinary tract symptoms   CKD (chronic kidney disease) stage 3, GFR 30-59 ml/min (HCC)  Wide complex tachycardia: likely secondary to ventricular tachycardia. Hx of defibrillator placement. Continue on amiodarone, metoprolol as per cardio.  A. fib: w/ RVR. New onset. Continue on amiodarone, metoprolol, & eliquis   Chronic systolic CHF: appears euvolemic. Continue on metoprolol, lasix  Hx of nonischemic dilated cardiomyopathy: continue on metoprolol. Hx of defibrillator placement   Elevated troponins: likely secondary to demand ischemia. No further cardiac work up inpatient as per cardio   HLD: continue on statin   Discharge Instructions  Discharge Instructions     Diet - low sodium heart healthy   Complete by: As directed    Discharge instructions   Complete by: As directed    F/u w/ cardio,  Dr. Saralyn Pilar, within 1 week. Do not take any NSAIDs (examples: ibuprofen, naproxen, naprosyn, aleve, advil) while taking eliquis. F/u w/ PCP in 1-2 weeks   Increase activity slowly   Complete by: As directed       Allergies as of  01/07/2021   No Known Allergies      Medication List     STOP taking these medications    ibuprofen 200 MG tablet Commonly known as: ADVIL       TAKE these medications    acetaminophen 500 MG tablet Commonly known as: TYLENOL Take 2 tablets (1,000 mg total) by mouth every 6 (six) hours.   amiodarone 400 MG tablet Commonly known as: PACERONE Take 400 mg by mouth daily.   apixaban 2.5 MG Tabs tablet Commonly known as: ELIQUIS Take 1 tablet (2.5 mg total) by mouth 2 (two) times daily.   furosemide 20 MG tablet Commonly known as: LASIX Take 20 mg by mouth daily.   metoprolol succinate 50 MG 24 hr tablet Commonly known as: TOPROL-XL Take 50 mg by mouth daily. What changed: Another medication with the same name was removed. Continue taking this medication, and follow the directions you see here.   ondansetron 4 MG disintegrating tablet Commonly known as: ZOFRAN-ODT Take 1 tablet (4 mg total) by mouth every 8 (eight) hours as needed for nausea or vomiting.   simvastatin 40 MG tablet Commonly known as: ZOCOR Take 40 mg by mouth at bedtime.        No Known Allergies  Consultations: Cardio    Procedures/Studies: DG Chest 2 View  Result Date: 01/06/2021 CLINICAL DATA:  Shortness of breath and tachycardia. Recent COVID diagnosis. EXAM: CHEST - 2 VIEW COMPARISON:  01/04/2021 FINDINGS: Elevated right hemidiaphragm with stable bandlike density likely related to atelectasis/volume loss. Heart size within normal limits. AICD noted. Atherosclerotic calcification of the aortic arch and descending thoracic aorta. No new airspace opacity is identified. IMPRESSION: 1. Stable right hemidiaphragmatic elevation with bandlike scarring or atelectasis at the right lung base. 2. AICD noted. 3. No new or progressive findings. Electronically Signed   By: Van Clines M.D.   On: 01/06/2021 16:26   DG Chest Portable 1 View  Result Date: 01/04/2021 CLINICAL DATA:  Nausea for 5  days, chest pain EXAM: PORTABLE CHEST 1 VIEW COMPARISON:  08/18/2018 FINDINGS: Single frontal view of the chest demonstrates stable AICD. Cardiac silhouette is unremarkable. No airspace disease, effusion, or pneumothorax. Chronic elevation right hemidiaphragm. No acute bony abnormalities. IMPRESSION: 1. Stable chest, no acute process. Electronically Signed   By: Randa Ngo M.D.   On: 01/04/2021 15:41   (Echo, Carotid, EGD, Colonoscopy, ERCP)    Subjective: Pt denies any complaints    Discharge Exam: Vitals:   01/07/21 0642 01/07/21 1029  BP: (!) 115/51 106/63  Pulse: 74 65  Resp: 16 18  Temp:    SpO2: 94% 95%   Vitals:   01/07/21 0039 01/07/21 0249 01/07/21 0642 01/07/21 1029  BP: 124/78 118/63 (!) 115/51 106/63  Pulse: 84 67 74 65  Resp: 19 17 16 18   Temp:      TempSrc:      SpO2: 98% 97% 94% 95%    General: Pt is alert, awake, not in acute distress Cardiovascular: S1/S2 +, no rubs, no gallops Respiratory: CTA bilaterally, no wheezing, no rhonchi Abdominal: Soft, NT, ND, bowel sounds + Extremities: no cyanosis    The results of significant diagnostics from this hospitalization (including imaging, microbiology, ancillary and laboratory) are  listed below for reference.     Microbiology: Recent Results (from the past 240 hour(s))  Resp Panel by RT-PCR (Flu A&B, Covid) Nasopharyngeal Swab     Status: Abnormal   Collection Time: 01/04/21  3:40 PM   Specimen: Nasopharyngeal Swab; Nasopharyngeal(NP) swabs in vial transport medium  Result Value Ref Range Status   SARS Coronavirus 2 by RT PCR POSITIVE (A) NEGATIVE Final    Comment: (NOTE) SARS-CoV-2 target nucleic acids are DETECTED.  The SARS-CoV-2 RNA is generally detectable in upper respiratory specimens during the acute phase of infection. Positive results are indicative of the presence of the identified virus, but do not rule out bacterial infection or co-infection with other pathogens not detected by the test.  Clinical correlation with patient history and other diagnostic information is necessary to determine patient infection status. The expected result is Negative.  Fact Sheet for Patients: EntrepreneurPulse.com.au  Fact Sheet for Healthcare Providers: IncredibleEmployment.be  This test is not yet approved or cleared by the [place] FDA and  has been authorized for detection and/or diagnosis of SARS-CoV-2 by FDA under an Emergency Use Authorization (EUA).  This EUA will remain in effect (meaning this test can be used) for the duration of  the COVID-19 declaration under Section 564(b)(1) of the A ct, 21 U.S.C. section 360bbb-3(b)(1), unless the authorization is terminated or revoked sooner.     Influenza A by PCR NEGATIVE NEGATIVE Final   Influenza B by PCR NEGATIVE NEGATIVE Final    Comment: (NOTE) The Xpert Xpress SARS-CoV-2/FLU/RSV plus assay is intended as an aid in the diagnosis of influenza from Nasopharyngeal swab specimens and should not be used as a sole basis for treatment. Nasal washings and aspirates are unacceptable for Xpert Xpress SARS-CoV-2/FLU/RSV testing.  Fact Sheet for Patients: EntrepreneurPulse.com.au  Fact Sheet for Healthcare Providers: IncredibleEmployment.be  This test is not yet approved or cleared by the [place] FDA and has been authorized for detection and/or diagnosis of SARS-CoV-2 by FDA under an Emergency Use Authorization (EUA). This EUA will remain in effect (meaning this test can be used) for the duration of the COVID-19 declaration under Section 564(b)(1) of the Act, 21 U.S.C. section 360bbb-3(b)(1), unless the authorization is terminated or revoked.  Performed at [facility], [address] [place], [place] [address]      Labs: BNP (last 3 results) Recent Labs    01/06/21 1543  BNP [id number]*   Basic Metabolic Panel: Recent Labs  Lab  01/04/21 1540 01/06/21 1542 01/07/21 0010  NA 137 136  --   K 3.5 3.2*  --   CL 104 102  --   CO2 25 27  --   GLUCOSE 127* 129*  --   BUN 23 19  --   CREATININE 1.67* 1.40*  --   CALCIUM 9.0 8.6*  --   MG 2.0 1.8 2.4  PHOS  --  2.6  --    Liver Function Tests: Recent Labs  Lab 01/04/21 1540 01/06/21 1542  AST 21 21  ALT 13 13  ALKPHOS 72 68  BILITOT 2.2* 1.8*  PROT 6.6 6.4*  ALBUMIN 3.3* 3.0*   Recent Labs  Lab 01/04/21 1540  LIPASE 23   No results for input(s): AMMONIA in the last 168 hours. CBC: Recent Labs  Lab 01/04/21 1540 01/06/21 1542  WBC 9.8 9.1  NEUTROABS 6.8 6.5  HGB 15.2 14.6  HCT 46.2 44.9  MCV 89.4 90.5  PLT 181 163   Cardiac Enzymes: No results for  input(s): CKTOTAL, CKMB, CKMBINDEX, TROPONINI in the last 168 hours. BNP: Invalid input(s): POCBNP CBG: No results for input(s): GLUCAP in the last 168 hours. D-Dimer No results for input(s): DDIMER in the last 72 hours. Hgb A1c No results for input(s): HGBA1C in the last 72 hours. Lipid Profile No results for input(s): CHOL, HDL, LDLCALC, TRIG, CHOLHDL, LDLDIRECT in the last 72 hours. Thyroid function studies Recent Labs    01/07/21 0010  TSH 0.702   Anemia work up No results for input(s): VITAMINB12, FOLATE, FERRITIN, TIBC, IRON, RETICCTPCT in the last 72 hours. Urinalysis    Component Value Date/Time   COLORURINE YELLOW (A) 09/01/2016 0735   APPEARANCEUR Clear 02/01/2018 1111   LABSPEC 1.025 09/01/2016 0735   LABSPEC 1.009 04/09/2013 1315   PHURINE 5.0 09/01/2016 0735   GLUCOSEU Negative 02/01/2018 1111   GLUCOSEU Negative 04/09/2013 1315   HGBUR LARGE (A) 09/01/2016 0735   BILIRUBINUR Negative 02/01/2018 1111   BILIRUBINUR Negative 04/09/2013 1315   KETONESUR NEGATIVE 09/01/2016 0735   PROTEINUR Negative 02/01/2018 1111   PROTEINUR 30 (A) 09/01/2016 0735   NITRITE Negative 02/01/2018 1111   NITRITE NEGATIVE 09/01/2016 0735   LEUKOCYTESUR Trace (A) 02/01/2018 1111    LEUKOCYTESUR Negative 04/09/2013 1315   Sepsis Labs Invalid input(s): PROCALCITONIN,  WBC,  LACTICIDVEN Microbiology Recent Results (from the past 240 hour(s))  Resp Panel by RT-PCR (Flu A&B, Covid) Nasopharyngeal Swab     Status: Abnormal   Collection Time: 01/04/21  3:40 PM   Specimen: Nasopharyngeal Swab; Nasopharyngeal(NP) swabs in vial transport medium  Result Value Ref Range Status   SARS Coronavirus 2 by RT PCR POSITIVE (A) NEGATIVE Final    Comment: (NOTE) SARS-CoV-2 target nucleic acids are DETECTED.  The SARS-CoV-2 RNA is generally detectable in upper respiratory specimens during the acute phase of infection. Positive results are indicative of the presence of the identified virus, but do not rule out bacterial infection or co-infection with other pathogens not detected by the test. Clinical correlation with patient history and other diagnostic information is necessary to determine patient infection status. The expected result is Negative.  Fact Sheet for Patients: EntrepreneurPulse.com.au  Fact Sheet for Healthcare Providers: IncredibleEmployment.be  This test is not yet approved or cleared by the [place] FDA and  has been authorized for detection and/or diagnosis of SARS-CoV-2 by FDA under an Emergency Use Authorization (EUA).  This EUA will remain in effect (meaning this test can be used) for the duration of  the COVID-19 declaration under Section 564(b)(1) of the A ct, 21 U.S.C. section 360bbb-3(b)(1), unless the authorization is terminated or revoked sooner.     Influenza A by PCR NEGATIVE NEGATIVE Final   Influenza B by PCR NEGATIVE NEGATIVE Final    Comment: (NOTE) The Xpert Xpress SARS-CoV-2/FLU/RSV plus assay is intended as an aid in the diagnosis of influenza from Nasopharyngeal swab specimens and should not be used as a sole basis for treatment. Nasal washings and aspirates are unacceptable for Xpert Xpress  SARS-CoV-2/FLU/RSV testing.  Fact Sheet for Patients: EntrepreneurPulse.com.au  Fact Sheet for Healthcare Providers: IncredibleEmployment.be  This test is not yet approved or cleared by the [place] FDA and has been authorized for detection and/or diagnosis of SARS-CoV-2 by FDA under an Emergency Use Authorization (EUA). This EUA will remain in effect (meaning this test can be used) for the duration of the COVID-19 declaration under Section 564(b)(1) of the Act, 21 U.S.C. section 360bbb-3(b)(1), unless the authorization is terminated or revoked.  Performed at [facility], [address] [place], [place] [address]      Time coordinating discharge: Over 30 minutes  SIGNED:   Wyvonnia Dusky, MD  [facility] 01/07/2021, 12:26 PM Pager   If 7PM-7AM, please contact night-coverage

## 2021-01-07 NOTE — TOC Progression Note (Signed)
Transition of Care [facility]) - Progression Note    Patient Details  Name: Spencer Coleman MRN: [id number] Date of Birth: 10-01-1930  [facility] CM/SW Contact  Allayne Butcher, RN Phone Number: 01/07/2021, 1:34 PM  Clinical Narrative:     Patient does not have transportation home, he lives alone and is independent.  RNCM reached out to family and no one is available to pick him up.  They are all scared to pick him up because they are old and immunocompromised and he has COVID.  RNCM has reached out to [employer] , waiting for return call when they find a vendor to transport.         Expected Discharge Plan and Services           Expected Discharge Date: 01/07/21                                     Social Determinants of Health (SDOH) Interventions    Readmission Risk Interventions No flowsheet data found.

## 2021-01-07 NOTE — ED Notes (Signed)
This RN and case manager trying to find transportation home for pt. Pt states his son doesn't have a license and cant drive at this time. Pt is COVID + so taxi or safe transport wont take pt and pt doesn't qualify for Ems transport since pt is ambulatory.

## 2021-01-28 ENCOUNTER — Encounter: Payer: Self-pay | Admitting: Cardiology

## 2021-01-28 ENCOUNTER — Ambulatory Visit
Admission: RE | Admit: 2021-01-28 | Discharge: 2021-01-28 | Disposition: A | Discharge status: Home / Self Care | Payer: Medicare HMO | Source: Ambulatory Visit | Attending: Cardiology | Admitting: Cardiology

## 2021-01-28 ENCOUNTER — Encounter
Admission: RE | Disposition: A | Discharge status: Home / Self Care | Payer: Self-pay | Source: Ambulatory Visit | Attending: Cardiology

## 2021-01-28 DIAGNOSIS — Z4502 Encounter for adjustment and management of automatic implantable cardiac defibrillator: Secondary | ICD-10-CM | POA: Insufficient documentation

## 2021-01-28 DIAGNOSIS — I420 Dilated cardiomyopathy: Secondary | ICD-10-CM | POA: Diagnosis not present

## 2021-01-28 DIAGNOSIS — I472 Ventricular tachycardia, unspecified: Secondary | ICD-10-CM

## 2021-01-28 HISTORY — PX: ICD GENERATOR CHANGEOUT: EP1231

## 2021-01-28 SURGERY — ICD GENERATOR CHANGEOUT
Anesthesia: Moderate Sedation

## 2021-01-28 MED ORDER — FENTANYL CITRATE (PF) 100 MCG/2ML IJ SOLN
INTRAMUSCULAR | Status: AC
  Filled 2021-01-28: qty 2

## 2021-01-28 MED ORDER — MIDAZOLAM HCL 2 MG/2ML IJ SOLN
INTRAMUSCULAR | Status: DC | PRN
  Administered 2021-01-28: 1 mg via INTRAVENOUS

## 2021-01-28 MED ORDER — CEFAZOLIN SODIUM-DEXTROSE 2-4 GM/100ML-% IV SOLN
INTRAVENOUS | Status: AC
  Administered 2021-01-28: 2 g via INTRAVENOUS
  Filled 2021-01-28: qty 100

## 2021-01-28 MED ORDER — ACETAMINOPHEN 325 MG PO TABS: 325.0000 mg | ORAL_TABLET | ORAL | Status: DC | PRN

## 2021-01-28 MED ORDER — SODIUM CHLORIDE 0.9 % IV SOLN
80.0000 mg | INTRAVENOUS | Status: AC
  Administered 2021-01-28: 80 mg
  Filled 2021-01-28: qty 80

## 2021-01-28 MED ORDER — HEPARIN (PORCINE) IN NACL 1000-0.9 UT/500ML-% IV SOLN
INTRAVENOUS | Status: AC
  Filled 2021-01-28: qty 1000

## 2021-01-28 MED ORDER — ONDANSETRON HCL 4 MG/2ML IJ SOLN: 4.0000 mg | Freq: Four times a day (QID) | INTRAMUSCULAR | Status: DC | PRN

## 2021-01-28 MED ORDER — CEPHALEXIN 250 MG PO CAPS: 500.0000 mg | ORAL_CAPSULE | Freq: Two times a day (BID) | ORAL | 0 refills | Status: DC

## 2021-01-28 MED ORDER — MIDAZOLAM HCL 2 MG/2ML IJ SOLN
INTRAMUSCULAR | Status: AC
  Filled 2021-01-28: qty 2

## 2021-01-28 MED ORDER — FENTANYL CITRATE (PF) 100 MCG/2ML IJ SOLN
INTRAMUSCULAR | Status: DC | PRN
  Administered 2021-01-28: 25 ug via INTRAVENOUS

## 2021-01-28 MED ORDER — LIDOCAINE HCL 1 % IJ SOLN
INTRAMUSCULAR | Status: AC
  Filled 2021-01-28: qty 20

## 2021-01-28 MED ORDER — SODIUM CHLORIDE 0.9 % IV SOLN: INTRAVENOUS | Status: DC

## 2021-01-28 MED ORDER — LIDOCAINE HCL (PF) 1 % IJ SOLN
INTRAMUSCULAR | Status: DC | PRN
  Administered 2021-01-28: 30 mL

## 2021-01-28 MED ORDER — CEFAZOLIN SODIUM-DEXTROSE 2-4 GM/100ML-% IV SOLN: 2.0000 g | INTRAVENOUS | Status: AC

## 2021-01-28 SURGICAL SUPPLY — 16 items
CABLE SURG 12 DISP A/V CHANNEL (MISCELLANEOUS) ×2 IMPLANT
COVER SURGICAL LIGHT HANDLE (MISCELLANEOUS) ×1 IMPLANT
DEVICE DSSCT PLSMBLD 3.0S LGHT (MISCELLANEOUS) IMPLANT
DRAPE INCISE 23X17 IOBAN STRL (DRAPES) ×1
DRAPE INCISE 23X17 STRL (DRAPES) IMPLANT
DRAPE INCISE IOBAN 23X17 STRL (DRAPES) ×1 IMPLANT
ICD CLARIA MRI DTMA1D1 (ICD Generator) ×1 IMPLANT
PAD ELECT DEFIB RADIOL ZOLL (MISCELLANEOUS) ×2 IMPLANT
PLASMABLADE 3.0S W/LIGHT (MISCELLANEOUS) ×2
SPONGE XRAY 4X4 16PLY STRL (MISCELLANEOUS) ×2 IMPLANT
SUT VIC AB 2-0 CT1 27 (SUTURE) ×2
SUT VIC AB 2-0 CT1 TAPERPNT 27 (SUTURE) IMPLANT
SUT VICRYL 4-0  27 PS-2 BARIAT (SUTURE) ×1
SUT VICRYL 4-0 27 PS-2 BARIAT (SUTURE) ×1
SUTURE VICRYL 4-0 27 PS-2 BART (SUTURE) IMPLANT
TRAY PACEMAKER INSERTION (PACKS) ×2 IMPLANT

## 2021-01-28 NOTE — Discharge Instructions (Addendum)
Implantable Cardiac Device Battery Change, Care After  This sheet gives you information about how to care for yourself after your procedure. Your health care provider may also give you more specific instructions. If you have problems or questions, contact your health care provider. What can I expect after the procedure? After your procedure, it is common to have:  Pain or soreness at the site where the cardiac device was inserted.  Swelling at the site where the cardiac device was inserted.  You should received an information card for your new device in 4-8 weeks. Follow these instructions at home: Incision care   Keep the incision clean and dry. ? Do not take baths, swim, or use a hot tub until after your wound check.  ? Do not shower for at least 7 days, or as directed by your health care provider. ? Pat the area dry with a clean towel. Do not rub the area. This may cause bleeding.  Follow instructions from your health care provider about how to take care of your incision. Make sure you: ? Leave stitches (sutures), skin glue, or adhesive strips in place. These skin closures may need to stay in place for 2 weeks or longer. If adhesive strip edges start to loosen and curl up, you may trim the loose edges. Do not remove adhesive strips completely unless your health care provider tells you to do that.  Check your incision area every day for signs of infection. Check for: ? More redness, swelling, or pain. ? More fluid or blood. ? Warmth. ? Pus or a bad smell. Activity  Do not lift anything that is heavier than 10 lb (4.5 kg) until your health care provider says it is okay to do so.  For the first week, or as long as told by your health care provider: ? Avoid lifting your affected arm higher than your shoulder. ? After 1 week, Be gentle when you move your arms over your head. It is okay to raise your arm to comb your hair. ? Avoid strenuous exercise.  Ask your health care provider  when it is okay to: ? Resume your normal activities. ? Return to work or school. ? Resume sexual activity. Eating and drinking  Eat a heart-healthy diet. This should include plenty of fresh fruits and vegetables, whole grains, low-fat dairy products, and lean protein like chicken and fish.  Limit alcohol intake to no more than 1 drink a day for non-pregnant women and 2 drinks a day for men. One drink equals 12 oz of beer, 5 oz of wine, or 1 oz of hard liquor.  Check ingredients and nutrition facts on packaged foods and beverages. Avoid the following types of food: ? Food that is high in salt (sodium). ? Food that is high in saturated fat, like full-fat dairy or red meat. ? Food that is high in trans fat, like fried food. ? Food and drinks that are high in sugar. Lifestyle  Do not use any products that contain nicotine or tobacco, such as cigarettes and e-cigarettes. If you need help quitting, ask your health care provider.  Take steps to manage and control your weight.  Once cleared, get regular exercise. Aim for 150 minutes of moderate-intensity exercise (such as walking or yoga) or 75 minutes of vigorous exercise (such as running or swimming) each week.  Manage other health problems, such as diabetes or high blood pressure. Ask your health care provider how you can manage these conditions. General instructions  Do   on the area where the cardiac device was placed. If you need an MRI after your cardiac device has been placed, be sure to tell the health care provider who orders the MRI that you have a cardiac device. Avoid close and prolonged exposure to electrical devices that have strong magnetic fields. These include: Cell phones. Avoid keeping them in a pocket near the cardiac device, and try using the ear opposite the cardiac  device. MP3 players. Household appliances, like microwaves. Metal detectors. Electric generators. High-tension wires. Keep all follow-up visits as directed by your health care provider. This is important. Contact a health care provider if: You have pain at the incision site that is not relieved by over-the-counter or prescription medicines. You have any of these around your incision site or coming from it: More redness, swelling, or pain. Fluid or blood. Warmth to the touch. Pus or a bad smell. You have a fever. You feel brief, occasional palpitations, light-headedness, or any symptoms that you think might be related to your heart. Get help right away if: You experience chest pain that is different from the pain at the cardiac device site. You develop a red streak that extends above or below the incision site. You experience shortness of breath. You have palpitations or an irregular heartbeat. You have light-headedness that does not go away quickly. You faint or have dizzy spells. Your pulse suddenly drops or increases rapidly and does not return to normal. You begin to gain weight and your legs and ankles swell. Summary After your procedure, it is common to have pain, soreness, and some swelling where the cardiac device was inserted. Make sure to keep your incision clean and dry. Follow instructions from your health care provider about how to take care of your incision. Check your incision every day for signs of infection, such as more pain or swelling, pus or a bad smell, warmth, or leaking fluid and blood. Avoid strenuous exercise and lifting your left arm higher than your shoulder for 2 weeks, or as long as told by your health care provider. This information is not intended to replace advice given to you by your health care provider. Make sure you discuss any questions you have with your health care provider. May remove outer bandage on 01/30/2021, leave Steri-Strips on.  May shower  on 01/30/2021.

## 2021-06-12 ENCOUNTER — Emergency Department: Payer: Medicare HMO

## 2021-06-12 ENCOUNTER — Inpatient Hospital Stay: Payer: Medicare HMO

## 2021-06-12 ENCOUNTER — Inpatient Hospital Stay
Admission: EM | Admit: 2021-06-12 | Discharge: 2021-06-14 | DRG: 291 | Disposition: A | Discharge status: Home Health | Payer: Medicare HMO | Attending: Obstetrics and Gynecology | Admitting: Obstetrics and Gynecology

## 2021-06-12 ENCOUNTER — Encounter: Payer: Self-pay | Admitting: Emergency Medicine

## 2021-06-12 ENCOUNTER — Other Ambulatory Visit: Payer: Self-pay

## 2021-06-12 DIAGNOSIS — Z79899 Other long term (current) drug therapy: Secondary | ICD-10-CM

## 2021-06-12 DIAGNOSIS — I10 Essential (primary) hypertension: Secondary | ICD-10-CM | POA: Diagnosis not present

## 2021-06-12 DIAGNOSIS — I130 Hypertensive heart and chronic kidney disease with heart failure and stage 1 through stage 4 chronic kidney disease, or unspecified chronic kidney disease: Secondary | ICD-10-CM | POA: Diagnosis present

## 2021-06-12 DIAGNOSIS — I472 Ventricular tachycardia, unspecified: Secondary | ICD-10-CM | POA: Diagnosis present

## 2021-06-12 DIAGNOSIS — I248 Other forms of acute ischemic heart disease: Secondary | ICD-10-CM | POA: Diagnosis present

## 2021-06-12 DIAGNOSIS — N179 Acute kidney failure, unspecified: Secondary | ICD-10-CM | POA: Diagnosis present

## 2021-06-12 DIAGNOSIS — N1831 Chronic kidney disease, stage 3a: Secondary | ICD-10-CM

## 2021-06-12 DIAGNOSIS — Z8249 Family history of ischemic heart disease and other diseases of the circulatory system: Secondary | ICD-10-CM

## 2021-06-12 DIAGNOSIS — Z9079 Acquired absence of other genital organ(s): Secondary | ICD-10-CM

## 2021-06-12 DIAGNOSIS — Z95810 Presence of automatic (implantable) cardiac defibrillator: Secondary | ICD-10-CM

## 2021-06-12 DIAGNOSIS — E785 Hyperlipidemia, unspecified: Secondary | ICD-10-CM

## 2021-06-12 DIAGNOSIS — I2510 Atherosclerotic heart disease of native coronary artery without angina pectoris: Secondary | ICD-10-CM | POA: Diagnosis present

## 2021-06-12 DIAGNOSIS — Z66 Do not resuscitate: Secondary | ICD-10-CM | POA: Diagnosis present

## 2021-06-12 DIAGNOSIS — R5381 Other malaise: Secondary | ICD-10-CM | POA: Diagnosis not present

## 2021-06-12 DIAGNOSIS — I5043 Acute on chronic combined systolic (congestive) and diastolic (congestive) heart failure: Secondary | ICD-10-CM | POA: Diagnosis present

## 2021-06-12 DIAGNOSIS — G9341 Metabolic encephalopathy: Secondary | ICD-10-CM | POA: Diagnosis present

## 2021-06-12 DIAGNOSIS — R410 Disorientation, unspecified: Secondary | ICD-10-CM | POA: Diagnosis present

## 2021-06-12 DIAGNOSIS — R778 Other specified abnormalities of plasma proteins: Secondary | ICD-10-CM | POA: Diagnosis not present

## 2021-06-12 DIAGNOSIS — I4510 Unspecified right bundle-branch block: Secondary | ICD-10-CM | POA: Diagnosis present

## 2021-06-12 DIAGNOSIS — I5023 Acute on chronic systolic (congestive) heart failure: Secondary | ICD-10-CM

## 2021-06-12 DIAGNOSIS — I420 Dilated cardiomyopathy: Secondary | ICD-10-CM | POA: Diagnosis present

## 2021-06-12 DIAGNOSIS — J9601 Acute respiratory failure with hypoxia: Principal | ICD-10-CM

## 2021-06-12 DIAGNOSIS — I25119 Atherosclerotic heart disease of native coronary artery with unspecified angina pectoris: Secondary | ICD-10-CM | POA: Diagnosis not present

## 2021-06-12 DIAGNOSIS — K8020 Calculus of gallbladder without cholecystitis without obstruction: Secondary | ICD-10-CM | POA: Diagnosis present

## 2021-06-12 DIAGNOSIS — N400 Enlarged prostate without lower urinary tract symptoms: Secondary | ICD-10-CM | POA: Diagnosis present

## 2021-06-12 DIAGNOSIS — K8010 Calculus of gallbladder with chronic cholecystitis without obstruction: Secondary | ICD-10-CM | POA: Diagnosis not present

## 2021-06-12 DIAGNOSIS — Z20822 Contact with and (suspected) exposure to covid-19: Secondary | ICD-10-CM | POA: Diagnosis present

## 2021-06-12 DIAGNOSIS — D696 Thrombocytopenia, unspecified: Secondary | ICD-10-CM | POA: Diagnosis present

## 2021-06-12 DIAGNOSIS — Z7901 Long term (current) use of anticoagulants: Secondary | ICD-10-CM

## 2021-06-12 DIAGNOSIS — Z8051 Family history of malignant neoplasm of kidney: Secondary | ICD-10-CM | POA: Diagnosis not present

## 2021-06-12 DIAGNOSIS — I480 Paroxysmal atrial fibrillation: Secondary | ICD-10-CM | POA: Diagnosis present

## 2021-06-12 LAB — CBC WITH DIFFERENTIAL/PLATELET
Abs Immature Granulocytes: 0.06 10*3/uL (ref 0.00–0.07)
Basophils Absolute: 0 10*3/uL (ref 0.0–0.1)
Basophils Relative: 1 %
Eosinophils Absolute: 0 10*3/uL (ref 0.0–0.5)
Eosinophils Relative: 0 %
HCT: 46.3 % (ref 39.0–52.0)
Hemoglobin: 14.6 g/dL (ref 13.0–17.0)
Immature Granulocytes: 1 %
Lymphocytes Relative: 23 %
Lymphs Abs: 1.4 10*3/uL (ref 0.7–4.0)
MCH: 29.8 pg (ref 26.0–34.0)
MCHC: 31.5 g/dL (ref 30.0–36.0)
MCV: 94.5 fL (ref 80.0–100.0)
Monocytes Absolute: 0.4 10*3/uL (ref 0.1–1.0)
Monocytes Relative: 6 %
Neutro Abs: 4.1 10*3/uL (ref 1.7–7.7)
Neutrophils Relative %: 69 %
Platelets: 139 10*3/uL — ABNORMAL LOW (ref 150–400)
RBC: 4.9 MIL/uL (ref 4.22–5.81)
RDW: 14.1 % (ref 11.5–15.5)
WBC: 6 10*3/uL (ref 4.0–10.5)
nRBC: 0 % (ref 0.0–0.2)

## 2021-06-12 LAB — LIPASE, BLOOD: Lipase: 27 U/L (ref 11–51)

## 2021-06-12 LAB — LIPID PANEL
Cholesterol: 149 mg/dL (ref 0–200)
HDL: 46 mg/dL (ref >40)
LDL Cholesterol: 98 mg/dL (ref 0–99)
Total CHOL/HDL Ratio: 3.2 RATIO
Triglycerides: 27 mg/dL (ref <150)
VLDL: 5 mg/dL (ref 0–40)

## 2021-06-12 LAB — HEPARIN LEVEL (UNFRACTIONATED)
Heparin Unfractionated: <0.1 IU/mL — ABNORMAL LOW (ref 0.30–0.70)
Heparin Unfractionated: 0.3 IU/mL (ref 0.30–0.70)

## 2021-06-12 LAB — BRAIN NATRIURETIC PEPTIDE: B Natriuretic Peptide: >4500 pg/mL — ABNORMAL HIGH (ref 0.0–100.0)

## 2021-06-12 LAB — TROPONIN I (HIGH SENSITIVITY)
Troponin I (High Sensitivity): 109 ng/L (ref <18)
Troponin I (High Sensitivity): 121 ng/L (ref <18)
Troponin I (High Sensitivity): 146 ng/L (ref <18)

## 2021-06-12 LAB — URINALYSIS, ROUTINE W REFLEX MICROSCOPIC
Bilirubin Urine: NEGATIVE
Glucose, UA: NEGATIVE mg/dL
Hgb urine dipstick: NEGATIVE
Ketones, ur: NEGATIVE mg/dL
Leukocytes,Ua: NEGATIVE
Nitrite: NEGATIVE
Protein, ur: NEGATIVE mg/dL
Specific Gravity, Urine: 1.01 (ref 1.005–1.030)
pH: 5 (ref 5.0–8.0)

## 2021-06-12 LAB — COMPREHENSIVE METABOLIC PANEL
ALT: 25 U/L (ref 0–44)
AST: 34 U/L (ref 15–41)
Albumin: 3.4 g/dL — ABNORMAL LOW (ref 3.5–5.0)
Alkaline Phosphatase: 70 U/L (ref 38–126)
Anion gap: 10 (ref 5–15)
BUN: 36 mg/dL — ABNORMAL HIGH (ref 8–23)
CO2: 22 mmol/L (ref 22–32)
Calcium: 9.3 mg/dL (ref 8.9–10.3)
Chloride: 108 mmol/L (ref 98–111)
Creatinine, Ser: 1.99 mg/dL — ABNORMAL HIGH (ref 0.61–1.24)
GFR, Estimated: 32 mL/min — ABNORMAL LOW (ref >60)
Glucose, Bld: 171 mg/dL — ABNORMAL HIGH (ref 70–99)
Potassium: 4.7 mmol/L (ref 3.5–5.1)
Sodium: 140 mmol/L (ref 135–145)
Total Bilirubin: 1.7 mg/dL — ABNORMAL HIGH (ref 0.3–1.2)
Total Protein: 6.2 g/dL — ABNORMAL LOW (ref 6.5–8.1)

## 2021-06-12 LAB — HEMOGLOBIN A1C
Hgb A1c MFr Bld: 5.6 % (ref 4.8–5.6)
Mean Plasma Glucose: 114.02 mg/dL

## 2021-06-12 LAB — PROTIME-INR
INR: 1.1 (ref 0.8–1.2)
Prothrombin Time: 14 seconds (ref 11.4–15.2)

## 2021-06-12 LAB — APTT: aPTT: 31 seconds (ref 24–36)

## 2021-06-12 LAB — SARS CORONAVIRUS 2 BY RT PCR: SARS Coronavirus 2 by RT PCR: NEGATIVE

## 2021-06-12 MED ORDER — METOPROLOL SUCCINATE ER 25 MG PO TB24
25.0000 mg | ORAL_TABLET | Freq: Every day | ORAL | Status: DC
  Administered 2021-06-12 – 2021-06-13 (×2): 25 mg via ORAL
  Filled 2021-06-12 (×2): qty 1

## 2021-06-12 MED ORDER — ASPIRIN 81 MG PO CHEW
324.0000 mg | CHEWABLE_TABLET | Freq: Once | ORAL | Status: AC
Start: 2021-06-12 — End: 2021-06-12
  Administered 2021-06-12: 324 mg via ORAL
  Filled 2021-06-12: qty 4

## 2021-06-12 MED ORDER — ALBUTEROL SULFATE (2.5 MG/3ML) 0.083% IN NEBU: 3.0000 mL | INHALATION_SOLUTION | RESPIRATORY_TRACT | Status: DC | PRN

## 2021-06-12 MED ORDER — DM-GUAIFENESIN ER 30-600 MG PO TB12: 1.0000 | ORAL_TABLET | Freq: Two times a day (BID) | ORAL | Status: DC | PRN

## 2021-06-12 MED ORDER — HYDRALAZINE HCL 20 MG/ML IJ SOLN
5.0000 mg | INTRAMUSCULAR | Status: DC | PRN
Start: 2021-06-12 — End: 2021-06-13

## 2021-06-12 MED ORDER — DIPHENHYDRAMINE HCL 50 MG/ML IJ SOLN: 12.5000 mg | Freq: Three times a day (TID) | INTRAMUSCULAR | Status: DC | PRN

## 2021-06-12 MED ORDER — ENOXAPARIN SODIUM 30 MG/0.3ML IJ SOSY: 30.0000 mg | PREFILLED_SYRINGE | Freq: Every day | INTRAMUSCULAR | Status: DC

## 2021-06-12 MED ORDER — APIXABAN 2.5 MG PO TABS
2.5000 mg | ORAL_TABLET | Freq: Two times a day (BID) | ORAL | Status: DC
  Administered 2021-06-12 – 2021-06-14 (×4): 2.5 mg via ORAL
  Filled 2021-06-12 (×5): qty 1

## 2021-06-12 MED ORDER — ACETAMINOPHEN 325 MG PO TABS: 650.0000 mg | ORAL_TABLET | Freq: Four times a day (QID) | ORAL | Status: DC | PRN

## 2021-06-12 MED ORDER — AMIODARONE HCL 200 MG PO TABS
200.0000 mg | ORAL_TABLET | Freq: Every day | ORAL | Status: DC
  Administered 2021-06-12 – 2021-06-14 (×3): 200 mg via ORAL
  Filled 2021-06-12 (×3): qty 1

## 2021-06-12 MED ORDER — HEPARIN BOLUS VIA INFUSION
5000.0000 [IU] | Freq: Once | INTRAVENOUS | Status: AC
  Administered 2021-06-12: 5000 [IU] via INTRAVENOUS
  Filled 2021-06-12: qty 5000

## 2021-06-12 MED ORDER — SIMVASTATIN 20 MG PO TABS
40.0000 mg | ORAL_TABLET | Freq: Every day | ORAL | Status: DC
  Administered 2021-06-12: 40 mg via ORAL
  Filled 2021-06-12: qty 2

## 2021-06-12 MED ORDER — FUROSEMIDE 10 MG/ML IJ SOLN
20.0000 mg | Freq: Once | INTRAMUSCULAR | Status: AC
  Administered 2021-06-12: 20 mg via INTRAVENOUS
  Filled 2021-06-12: qty 4

## 2021-06-12 MED ORDER — ASPIRIN 81 MG PO TBEC
81.0000 mg | DELAYED_RELEASE_TABLET | Freq: Every day | ORAL | Status: DC
  Administered 2021-06-13 – 2021-06-14 (×2): 81 mg via ORAL
  Filled 2021-06-12 (×2): qty 1

## 2021-06-12 MED ORDER — FUROSEMIDE 10 MG/ML IJ SOLN
20.0000 mg | Freq: Two times a day (BID) | INTRAMUSCULAR | Status: DC
  Administered 2021-06-12 – 2021-06-14 (×4): 20 mg via INTRAVENOUS
  Filled 2021-06-12 (×4): qty 2

## 2021-06-12 MED ORDER — HEPARIN (PORCINE) 25000 UT/250ML-% IV SOLN
1350.0000 [IU]/h | INTRAVENOUS | Status: DC
  Administered 2021-06-12: 1350 [IU]/h via INTRAVENOUS
  Filled 2021-06-12: qty 250

## 2021-06-12 NOTE — ED Notes (Signed)
RN to bedside to assist pt to toilet. Pt is very unsteady on his feet, not making sense with his words and is now having some vision issues. Pt placed back in bed and hooked to monitor. Pt speech slightly incomprehensible. Will msg MD.

## 2021-06-12 NOTE — H&P (Signed)
History and Physical    Spencer Coleman [id number] DOB: November 04, 1930 DOA: 06/12/2021  Referring MD/NP/PA:   PCP: Tracie Harrier, MD   Patient coming from:  The patient is coming from home.  At baseline, pt is independent for most of ADL.        Chief Complaint: SOB  HPI: Spencer Coleman is a 88 y.o. male with medical history significant of sCHF, hypertension, hyperlipidemia, V. tach, s/p of AICD, CKD-3A, CAD, BPH, thrombocytopenia, who presents with shortness of breath.   Patient states that he has shortness of breath in the past several days, which has been progressively worsening.  Patient also has palpitation, denies chest pain, fever or chills.  Patient has mild dry cough.  Patient was found to have oxygen desaturation to 89% on room air initially, nonrebreather was started, then changed to 2 L oxygen. His shortness breath has improved in ED.  Currently patient is 98% on room air.  Patient denies nausea vomiting, diarrhea or abdominal pain.  No fever or chills.  No symptoms of UTI.  Addendum: pt was initially alert oriented x3 when I saw patient in ED.  Later on patient becomes confused.  Etiology is not clear.  He moves all extremities normally.  No unilateral numbness or tinglings in extremities.  Stat CT scanning of head is negative.   Data Reviewed and ED Course: pt was found to have BNP>4500, Troponin level 109 --> 121.  WBC 6.0, INR 1.1, PTT 31, negative COVID PCR, worsening renal function, liver function (ALP 70, AST 34, ALT 25, total bilirubin 1.7), temperature 97.5, blood pressure 150/91, heart rate 71, RR 21, oxygen saturation 98% currently.  Chest x-ray showed cardiomegaly and elevated right diaphragm.  Patient is admitted to PCU as inpatient.  Dr. Saralyn Pilar of card and Dr. Christian Mate of surgery are consulted.  CT-chest without contrast: 1. Cholelithiasis with evidence of cholecystitis. Further clinical evaluation is recommended. This could be better evaluated with right upper  quadrant abdominal ultrasound if clinically appropriate. 2. Severe chronic elevation of the right hemidiaphragm redemonstrated. No acute findings in the thorax otherwise noted to account for the patient's symptoms. 3. Mild cardiomegaly. 4. Aortic atherosclerosis, in addition to left main and three-vessel coronary artery disease. 5. There are calcifications of the aortic valve. Echocardiographic correlation for evaluation of potential valvular dysfunction may be warranted if clinically indicated.   Aortic Atherosclerosis (ICD10-I70.0).   US-RUQ 1. Confirmed abnormal gallbladder wall thickening. But limited ultrasound visualization of the gallbladder due to elevated right hemidiaphragm. Gallstones in the gallbladder neck much better demonstrated by CT today. Despite absent sonographic Murphy sign the constellation is Highly Suspicious For Acute Cholecystitis. 2. No evidence of bile duct obstruction.  EKG: I have personally reviewed.  Paced rhythm, QTc 552.   Review of Systems:   General: no fevers, chills, no body weight gain, fatigue HEENT: no blurry vision, hearing changes or sore throat Respiratory: has dyspnea, coughing, no wheezing CV: no chest pain, has palpitations GI: no nausea, vomiting, abdominal pain, diarrhea, constipation GU: no dysuria, burning on urination, increased urinary frequency, hematuria  Ext: has trace leg edema Neuro: no unilateral weakness, numbness, or tingling, no vision change or hearing loss Skin: no rash, no skin tear. MSK: No muscle spasm, no deformity, no limitation of range of movement in spin Heme: No easy bruising.  Travel history: No recent long distant travel.   Allergy: No Known Allergies  Past Medical History:  Diagnosis Date   CAD (coronary artery disease)  Nonobstructive cardiac disease by cath   CHF (congestive heart failure) (HCC)    nonischemic cardiomyopathy   CKD (chronic kidney disease)    History of kidney stones     Hyperlipidemia    Hypertension    Inguinal hernia    Patella fracture [id number]   Renal colic [id number]   Ventricular tachycardia [facility])    s/p ICD    Past Surgical History:  Procedure Laterality Date   CARDIAC CATHETERIZATION     CARDIAC DEFIBRILLATOR PLACEMENT  2009   CYSTOSCOPY WITH LITHOLAPAXY N/A 02/21/2018   Procedure: CYSTOSCOPY WITH LITHOLAPAXY;  Surgeon: Billey Co, MD;  Location: [facility] ORS;  Service: Urology;  Laterality: N/A;   ELBOW SURGERY Left    HOLMIUM LASER APPLICATION N/A [id number]   Procedure: HOLMIUM LASER APPLICATION;  Surgeon: Billey Co, MD;  Location: [facility] ORS;  Service: Urology;  Laterality: N/A;   ICD GENERATOR CHANGEOUT N/A 01/28/2021   Procedure: ICD GENERATOR CHANGEOUT;  Surgeon: Isaias Cowman, MD;  Location: [place] [facility];  Service: Cardiovascular;  Laterality: N/A;   INGUINAL HERNIA REPAIR     LITHOTRIPSY     ORIF left wrist fracture Left    ORIF PATELLA Left 01/10/2015   Procedure: OPEN REDUCTION INTERNAL (ORIF) FIXATION PATELLA;  Surgeon: Dereck Leep, MD;  Location: [facility] ORS;  Service: Orthopedics;  Laterality: Left;   ORIF PATELLA Left 02/06/2015   Procedure: OPEN REDUCTION INTERNAL (ORIF) FIXATION PATELLA;  Surgeon: Dereck Leep, MD;  Location: [facility] ORS;  Service: Orthopedics;  Laterality: Left;   TRANSURETHRAL RESECTION OF PROSTATE N/A 02/21/2018   Procedure: TRANSURETHRAL RESECTION OF THE PROSTATE (TURP);  Surgeon: Billey Co, MD;  Location: [facility] OR[facility];  Service: Urology;  Laterality: N/A;    Social History:  reports that he has never smoked. He has never used smokeless tobacco. He reports that he does not drink alcohol and does not use drugs.  Family History:  Family History  Problem Relation Age of Onset   CAD Mother    Kidney cancer Father    Prostate cancer Neg Hx    Bladder Cancer Neg Hx      Prior to Admission medications   Medication Sig Start Date End Date Taking? Authorizing Provider  amiodarone  (PACERONE) 400 MG tablet Take 400 mg by mouth daily.   Yes [provider]  furosemide (LASIX) 20 MG tablet Take 20 mg by mouth daily. 11/09/20  Yes [provider]  metoprolol succinate (TOPROL-XL) 25 MG 24 hr tablet Take 25 mg by mouth daily. 03/17/21  Yes [provider]  simvastatin (ZOCOR) 40 MG tablet Take 40 mg by mouth at bedtime. 11/09/20  Yes [provider]  acetaminophen (TYLENOL) 500 MG tablet Take 2 tablets (1,000 mg total) by mouth every 6 (six) hours. 02/22/18   Billey Co, MD    Physical Exam: Vitals:   06/12/21 0745 06/12/21 1200 06/12/21 1500 06/12/21 1700  BP: (!) 150/91 (!) 145/82 129/64 135/90  Pulse: 71 71 72 72  Resp: 16 19 (!) 21 17  Temp:    97.8 F (36.6 C)  TempSrc:    Oral  SpO2: 98% 95% 100% 100%  Weight:      Height:       General: Not in acute distress HEENT:       Eyes: PERRL, EOMI, no scleral icterus.       ENT: No discharge from the ears and nose, no pharynx injection, no tonsillar enlargement.  Neck: positive JVD, no bruit, no mass felt. Heme: No neck lymph node enlargement. Cardiac: S1/S2, RRR, No murmurs, No gallops or rubs. Respiratory: Fine crackles bilaterally GI: Soft, nondistended, nontender, no rebound pain, no organomegaly, BS present. GU: No hematuria Ext: Trace leg edema bilaterally. 1+DP/PT pulse bilaterally. Musculoskeletal: No joint deformities, No joint redness or warmth, no limitation of ROM in spin. Skin: No rashes.  Neuro: Alert, oriented X3, cranial nerves II-XII grossly intact, moves all extremities normally.  Psych: Patient is not psychotic, no suicidal or hemocidal ideation.  Labs on Admission: I have personally reviewed following labs and imaging studies  CBC: Recent Labs  Lab 06/12/21 0433  WBC 6.0  NEUTROABS 4.1  HGB 14.6  HCT 46.3  MCV 94.5  PLT [id number]*   Basic Metabolic Panel: Recent Labs  Lab 06/12/21 0433  NA 140  K 4.7  CL 108  CO2 22  GLUCOSE 171*  BUN  36*  CREATININE 1.99*  CALCIUM 9.3   GFR: Estimated Creatinine Clearance: 26.5 mL/min (A) (by C-G formula based on SCr of 1.99 mg/dL (H)). Liver Function Tests: Recent Labs  Lab 06/12/21 0433  AST 34  ALT 25  ALKPHOS 70  BILITOT 1.7*  PROT 6.2*  ALBUMIN 3.4*   Recent Labs  Lab 06/12/21 0433  LIPASE 27   No results for input(s): AMMONIA in the last 168 hours. Coagulation Profile: Recent Labs  Lab 06/12/21 0433  INR 1.1   Cardiac Enzymes: No results for input(s): CKTOTAL, CKMB, CKMBINDEX, TROPONINI in the last 168 hours. BNP (last 3 results) No results for input(s): PROBNP in the last 8760 hours. HbA1C: No results for input(s): HGBA1C in the last 72 hours. CBG: No results for input(s): GLUCAP in the last 168 hours. Lipid Profile: Recent Labs    06/12/21 0617  CHOL 149  HDL 46  LDLCALC 98  TRIG 27  CHOLHDL 3.2   Thyroid Function Tests: No results for input(s): TSH, T4TOTAL, FREET4, T3FREE, THYROIDAB in the last 72 hours. Anemia Panel: No results for input(s): VITAMINB12, FOLATE, FERRITIN, TIBC, IRON, RETICCTPCT in the last 72 hours. Urine analysis:    Component Value Date/Time   COLORURINE YELLOW (A) 06/12/2021 0446   APPEARANCEUR CLEAR (A) 06/12/2021 0446   APPEARANCEUR Clear 02/01/2018 1111   LABSPEC 1.010 06/12/2021 0446   LABSPEC 1.009 04/09/2013 1315   PHURINE 5.0 06/12/2021 0446   GLUCOSEU NEGATIVE 06/12/2021 0446   GLUCOSEU Negative 04/09/2013 1315   HGBUR NEGATIVE 06/12/2021 0446   BILIRUBINUR NEGATIVE 06/12/2021 0446   BILIRUBINUR Negative 02/01/2018 1111   BILIRUBINUR Negative 04/09/2013 1315   KETONESUR NEGATIVE 06/12/2021 0446   PROTEINUR NEGATIVE 06/12/2021 0446   NITRITE NEGATIVE 06/12/2021 0446   LEUKOCYTESUR NEGATIVE 06/12/2021 0446   LEUKOCYTESUR Negative 04/09/2013 1315   Sepsis Labs: @LABRCNTIP (procalcitonin:4,lacticidven:4) ) Recent Results (from the past 240 hour(s))  SARS Coronavirus 2 by RT PCR (hospital order, performed  in [facility] hospital lab) *cepheid single result test* Anterior Nasal Swab     Status: None   Collection Time: 06/12/21  5:17 AM   Specimen: Anterior Nasal Swab  Result Value Ref Range Status   SARS Coronavirus 2 by RT PCR NEGATIVE NEGATIVE Final    Comment: (NOTE) SARS-CoV-2 target nucleic acids are NOT DETECTED.  The SARS-CoV-2 RNA is generally detectable in upper and lower respiratory specimens during the acute phase of infection. The lowest concentration of SARS-CoV-2 viral copies this assay can detect is 250 copies / mL. A negative result does not preclude SARS-CoV-2 infection  and should not be used as the sole basis for treatment or other patient management decisions.  A negative result may occur with improper specimen collection / handling, submission of specimen other than nasopharyngeal swab, presence of viral mutation(s) within the areas targeted by this assay, and inadequate number of viral copies (<250 copies / mL). A negative result must be combined with clinical observations, patient history, and epidemiological information.  Fact Sheet for Patients:   https://www.patel.info/  Fact Sheet for Healthcare Providers: https://hall.com/  This test is not yet approved or  cleared by the [place] FDA and has been authorized for detection and/or diagnosis of SARS-CoV-2 by FDA under an Emergency Use Authorization (EUA).  This EUA will remain in effect (meaning this test can be used) for the duration of the COVID-19 declaration under Section 564(b)(1) of the Act, 21 U.S.C. section 360bbb-3(b)(1), unless the authorization is terminated or revoked sooner.  Performed at [facility], [address] [place], [place] [address]      Radiological Exams on Admission: CT HEAD WO CONTRAST (5MM)  Result Date: 06/12/2021 CLINICAL DATA:  Mental status change. EXAM: CT HEAD WITHOUT CONTRAST TECHNIQUE: Contiguous axial images  were obtained from the base of the skull through the vertex without intravenous contrast. RADIATION DOSE REDUCTION: This exam was performed according to the departmental dose-optimization program which includes automated exposure control, adjustment of the mA and/or kV according to patient size and/or use of iterative reconstruction technique. COMPARISON:  Jun 04, 2016 FINDINGS: Brain: No subdural, epidural, or subarachnoid hemorrhage. Cerebellum, brainstem, and basal cisterns are unremarkable. Ventricles and sulci are stable. No acute cortical ischemia or infarct identified. No mass effect or midline shift. Vascular: Calcified atherosclerotic changes in the intracranial carotid arteries. Skull: Normal. Negative for fracture or focal lesion. Sinuses/Orbits: No acute finding. Other: None. IMPRESSION: No acute intracranial abnormalities are identified. Electronically Signed   By: Dorise Bullion III M.D.   On: 06/12/2021 13:14   CT Chest Wo Contrast  Result Date: 06/12/2021 CLINICAL DATA:  88 year old male with history of respiratory illness. Abnormal chest x-ray. EXAM: CT CHEST WITHOUT CONTRAST TECHNIQUE: Multidetector CT imaging of the chest was performed following the standard protocol without IV contrast. RADIATION DOSE REDUCTION: This exam was performed according to the departmental dose-optimization program which includes automated exposure control, adjustment of the mA and/or kV according to patient size and/or use of iterative reconstruction technique. COMPARISON:  Chest CT 06/07/2007. FINDINGS: Cardiovascular: Heart size is mildly enlarged. There is no significant pericardial fluid, thickening or pericardial calcification. There is aortic atherosclerosis, as well as atherosclerosis of the great vessels of the mediastinum and the coronary arteries, including calcified atherosclerotic plaque in the left main, left anterior descending, left circumflex and right coronary arteries. Left-sided biventricular  pacemaker/AICD with lead tips terminating in the right atrium, right ventricular apex and overlying the lateral wall the left ventricle via the coronary sinus and coronary veins. Calcifications of the aortic valve. Mediastinum/Nodes: No pathologically enlarged mediastinal or hilar lymph nodes. Esophagus is unremarkable in appearance. No axillary lymphadenopathy. Lungs/Pleura: Areas of scarring and/or atelectasis in the right lung base. No acute consolidative airspace disease. No pleural effusions. No suspicious appearing pulmonary nodules or masses are noted. Upper Abdomen: Severe elevation of the right hemidiaphragm again noted. Calcified gallstones in the neck of the gallbladder. Gallbladder appears nearly decompressed, however, gallbladder wall appears thickened and edematous with trace volume of pericholecystic fluid, concerning for probable acute cholecystitis. Incompletely imaged well-circumscribed exophytic lesion in the upper pole of  the left kidney is low-attenuation (statistically likely a cyst, no imaging follow-up recommended) measuring at least 5.3 cm in diameter. Extensive atherosclerosis of the abdominal aorta and visualized mesenteric vasculature. Musculoskeletal: There are no aggressive appearing lytic or blastic lesions noted in the visualized portions of the skeleton. IMPRESSION: 1. Cholelithiasis with evidence of cholecystitis. Further clinical evaluation is recommended. This could be better evaluated with right upper quadrant abdominal ultrasound if clinically appropriate. 2. Severe chronic elevation of the right hemidiaphragm redemonstrated. No acute findings in the thorax otherwise noted to account for the patient's symptoms. 3. Mild cardiomegaly. 4. Aortic atherosclerosis, in addition to left main and three-vessel coronary artery disease. 5. There are calcifications of the aortic valve. Echocardiographic correlation for evaluation of potential valvular dysfunction may be warranted if  clinically indicated. Aortic Atherosclerosis (ICD10-I70.0). Electronically Signed   By: Vinnie Langton M.D.   On: 06/12/2021 05:40   DG Chest Port 1 View  Result Date: 06/12/2021 CLINICAL DATA:  88 year old male with history of acute dyspnea and hypoxemia. Difficulty breathing for the past 3 days. EXAM: PORTABLE CHEST 1 VIEW COMPARISON:  Chest x-ray 01/06/2021. FINDINGS: Worsening severe chronic elevation of the right hemidiaphragm. Opacity at the right base which likely reflects passive atelectasis. Left lung appears clear. No pleural effusions. No pneumothorax. No evidence of pulmonary edema. Heart size appears mildly enlarged. Upper mediastinal contours are within normal limits allowing for patient positioning. Atherosclerotic calcifications in the thoracic aorta. Left-sided biventricular pacemaker/AICD with lead tips projecting over the expected location of the right atrium, right ventricle and left ventricle via the coronary sinus and coronary veins. IMPRESSION: 1. Severe worsening chronic elevation of the right hemidiaphragm with probable passive subsegmental atelectasis in the right lung base. 2. Mild cardiomegaly. 3. Aortic atherosclerosis. Electronically Signed   By: Vinnie Langton M.D.   On: 06/12/2021 05:02   US ABDOMEN LIMITED RUQ (LIVER/GB)  Result Date: 06/12/2021 CLINICAL DATA:  88 year old male with gallstones and evidence of gallbladder inflammation on noncontrast chest CT this morning. EXAM: ULTRASOUND ABDOMEN LIMITED RIGHT UPPER QUADRANT COMPARISON:  Chest CT 0525 hours today. FINDINGS: Suboptimal exam due to elevated right hemidiaphragm, acoustic shadowing of the liver by the lung. Gallbladder: Thickened gallbladder wall up to 5 mm. No pericholecystic fluid. Gallstones in the neck of the gallbladder remote better demonstrated by CT this morning. Reportedly no sonographic Murphy sign was elicited. Common bile duct: Diameter: 3 mm, normal. Liver: No focal lesion identified. Within normal  limits in parenchymal echogenicity. Portal vein is patent on color Doppler imaging with normal direction of blood flow towards the liver. Other: No free fluid identified. IMPRESSION: 1. Confirmed abnormal gallbladder wall thickening. But limited ultrasound visualization of the gallbladder due to elevated right hemidiaphragm. Gallstones in the gallbladder neck much better demonstrated by CT today. Despite absent sonographic Murphy sign the constellation is Highly Suspicious For Acute Cholecystitis. 2. No evidence of bile duct obstruction. Electronically Signed   By: Genevie Ann M.D.   On: 06/12/2021 07:09      Assessment/Plan Principal Problem:   Acute on chronic systolic CHF (congestive heart failure) (HCC) Active Problems:   CAD (coronary artery disease)   Elevated troponin   Essential hypertension   Ventricular tachycardia   HLD (hyperlipidemia)   Acute renal failure superimposed on stage 3a chronic kidney disease (HCC)   Gallstone   Thrombocytopenia (HCC)   Acute metabolic encephalopathy   PAF (paroxysmal atrial fibrillation) (HCC)     Assessment and Plan: * Acute on chronic systolic CHF (congestive  heart failure) ([place]) 2D echo on 04/21/2016 showed EF of 40%.  Patient has only trace leg edema, but he has elevated BNP > 4500, positive JVD and fine crackles on auscultation, clinically consistent with possible CHF exacerbation.  -Will admit to progressive unit as inpatient -Lasix 20 mg bid by IV -2d echo -Daily weights -strict I/O's -Low salt diet -Fluid restriction -Obtain REDs Vest reading     CAD (coronary artery disease) CAD and elevated troponin: Troponin level 109 --> 121. No CP.  Initially IV heparin was started in ED. Consulted Dr. Saralyn Pilar of cardiology, who did not think patient have NSTEMI, recommended DC heparin which was stopped.  - Trend Trop - aspirin, zocor - Risk factor stratification: will check FLP and A1C      Elevated troponin See above  Essential  hypertension - IV hydralazine as needed -Metoprolol -Patient is on IV Lasix  Ventricular tachycardia - Amiodarone 200 mg daily, metoprolol  HLD (hyperlipidemia) - Zocor  Acute renal failure superimposed on stage 3a chronic kidney disease ([place]) Recent baseline creatinine 1.21 on 01/07/2021, then 1.5 on 04/09/2021.  His creatinine is 1.99, BUN 36, possibly due to cardiorenal syndrome -Avoid using renal toxic medications -Follow-up renal function by BMP  Gallstone Both CT scan and ultrasound of right upper quadrant showed gallstone with possible cholecystitis, but the patient does not have fever or leukocytosis.  No abdominal pain, nausea or vomiting.  Clinically does not seem to have acute cholecystitis.  Consulted Dr. Christian Mate of general surgery -Follow-up surgeon's recommendation  Thrombocytopenia [facility]) This is chronic issue.  Platelet 123 on 08/18/18, and 180 01/07/2021.  Today platelet 139, etiology is not clear. -Follow-up with CBC  Acute metabolic encephalopathy Etiology is not clear, possibly due to delirium.  CT head negative for acute intracranial abnormalities.  Cannot do MRI due to presence of AICD. -Frequent neurochecks  PAF (paroxysmal atrial fibrillation) (HCC) - Started on Eliquis 2.5 mg twice daily per Dr. Saralyn Pilar recommendations -Patient is metoprolol and amiodarone             DVT ppx: IV Heparin     Code Status: DNR per pt and his brother  Family Communication:  Yes, patient's brother by phone  Disposition Plan:  Anticipate discharge back to previous environment  Consults called:  Dr. Saralyn Pilar  of Card and Dr. Christian Mate of surgery  Admission status and Level of care: Progressive:    as inpt      Severity of Illness:  The appropriate patient status for this patient is INPATIENT. Inpatient status is judged to be reasonable and necessary in order to provide the required intensity of service to ensure the patient's safety. The patient's  presenting symptoms, physical exam findings, and initial radiographic and laboratory data in the context of their chronic comorbidities is felt to place them at high risk for further clinical deterioration. Furthermore, it is not anticipated that the patient will be medically stable for discharge from the hospital within 2 midnights of admission.   * I certify that at the point of admission it is my clinical judgment that the patient will require inpatient hospital care spanning beyond 2 midnights from the point of admission due to high intensity of service, high risk for further deterioration and high frequency of surveillance required.*       Date of Service 06/12/2021    Ivor Costa [facility]   If 7PM-7AM, please contact night-coverage [email or web address] 06/12/2021, 6:13 PM

## 2021-06-12 NOTE — ED Provider Notes (Signed)
[facility] Provider Note    Event Date/Time   First MD Initiated Contact with Patient 06/12/21 [phone]     (approximate)   History   Shortness of Breath   HPI Level 5 caveat:  history/ROS limited by acute/critical illness  Spencer Coleman is a 88 y.o. male whose history includes dilated cardiomyopathy with a pacemaker/defibrillator, prior episodes of ventricular tachycardia, hyperlipidemia, hypertension, coronary artery disease, and chronic kidney disease.  His cardiologist is Dr. Darrold Junker.  He presents by EMS for shortness of breath.  Reportedly the patient has been having shortness of breath, particularly with exertion, for the last 3 to 4 days.  However the symptoms seem to only happen at night.  He does not use supplemental oxygen but states that when he got up to go the bathroom he got very short of breath.  First responders noted that his oxygen saturation was about 80% and they put him on a nonrebreather.  Paramedics had a difficult time getting a good oxygen saturation measurement but also had difficulty getting a good waveform.  They brought him in on a nonrebreather.  The patient says he feels better.  He has been feeling some palpitations but no chest pain.  He has had no abdominal pain or nausea/vomiting.  He confirmed the history as described above and reported by paramedics.  He says he feels much better.  He does believe that his defibrillator was shocking him earlier.     Physical Exam   Triage Vital Signs: ED Triage Vitals  Enc Vitals Group     BP 06/12/21 0440 (!) 149/105     Pulse Rate 06/12/21 0433 76     Resp 06/12/21 0433 12     Temp 06/12/21 0439 (!) 97.5 F (36.4 C)     Temp Source 06/12/21 0439 Oral     SpO2 06/12/21 0443 98 %     Weight 06/12/21 0435 83.5 kg (184 lb)     Height 06/12/21 0435 1.778 m (5\' 10" )     Head Circumference --      Peak Flow --      Pain Score 06/12/21 0434 0     Pain Loc --      Pain Edu? --      Excl.  in GC? --     Most recent vital signs: Vitals:   06/12/21 0707 06/12/21 0745  BP: 135/88 (!) 150/91  Pulse: 69 71  Resp: (!) 21 16  Temp:    SpO2: 95% 98%     General: Awake, no respiratory distress at this time. CV:  Good peripheral perfusion.  Pacer/defibrillator in place.  No significant abnormal heart sounds. Resp:  Normal effort.  Some minimal coarse breath sounds in the lung bases but otherwise clear.  No wheezing.  No accessory muscle usage. Abd:  No distention.  No tenderness to palpation including in the right upper quadrant with negative Murphy sign.  No rebound or guarding. Other:  No focal neurological deficits appreciated.   ED Results / Procedures / Treatments   Labs (all labs ordered are listed, but only abnormal results are displayed) Labs Reviewed  BRAIN NATRIURETIC PEPTIDE - Abnormal; Notable for the following components:      Result Value   B Natriuretic Peptide >4,500.0 (*)    All other components within normal limits  COMPREHENSIVE METABOLIC PANEL - Abnormal; Notable for the following components:   Glucose, Bld 171 (*)    BUN 36 (*)  Creatinine, Ser 1.99 (*)    Total Protein 6.2 (*)    Albumin 3.4 (*)    Total Bilirubin 1.7 (*)    GFR, Estimated 32 (*)    All other components within normal limits  CBC WITH DIFFERENTIAL/PLATELET - Abnormal; Notable for the following components:   Platelets 139 (*)    All other components within normal limits  URINALYSIS, ROUTINE W REFLEX MICROSCOPIC - Abnormal; Notable for the following components:   Color, Urine YELLOW (*)    APPearance CLEAR (*)    All other components within normal limits  BLOOD GAS, VENOUS - Abnormal; Notable for the following components:   Bicarbonate 29.0 (*)    All other components within normal limits  HEPARIN LEVEL (UNFRACTIONATED) - Abnormal; Notable for the following components:   Heparin Unfractionated <0.10 (*)    All other components within normal limits  TROPONIN I (HIGH  SENSITIVITY) - Abnormal; Notable for the following components:   Troponin I (High Sensitivity) 109 (*)    All other components within normal limits  TROPONIN I (HIGH SENSITIVITY) - Abnormal; Notable for the following components:   Troponin I (High Sensitivity) 121 (*)    All other components within normal limits  SARS CORONAVIRUS 2 BY RT PCR  LIPASE, BLOOD  PROTIME-INR  APTT  HEPARIN LEVEL (UNFRACTIONATED)     EKG  ED ECG REPORT  I, Loleta Roseory Adolfo Granieri, the attending physician, personally viewed and interpreted this ECG.  Date: 06/12/2021 EKG Time: 4:33 AM Rate: 77 Rhythm: Sinus rhythm with multiform ventricular premature complexes QRS Axis: normal Intervals: Right bundle branch block ST/T Wave abnormalities: Non-specific ST segment / T-wave changes, but no clear evidence of acute ischemia. Narrative Interpretation: no definitive evidence of acute ischemia; does not meet STEMI criteria.    RADIOLOGY I viewed and interpreted the patient's one-view portable chest x-ray which is notable for right hemidiaphragm elevation and pacemaker/defibrillator placement.  No obvious pneumonia.  The radiologist also commented on some atelectasis.  I viewed and interpreted the patient's CT chest without contrast.  No obvious pneumonia or pulmonary edema.  Radiologist expressed concern for cholecystitis based on the appearance of the gallbladder and biliary tract.  Ultrasound of the right upper quadrant pending at time of admission.    PROCEDURES:  Critical Care performed: Yes, see critical care procedure note(s)  .1-3 Lead EKG Interpretation Performed by: Loleta RoseForbach, Latroya Ng, MD Authorized by: Loleta RoseForbach, Trevelle Mcgurn, MD     Interpretation: normal     ECG rate:  75   ECG rate assessment: normal     Rhythm: sinus rhythm     Ectopy: PVCs     Conduction: normal   .Critical Care Performed by: Loleta RoseForbach, Zayed Griffie, MD Authorized by: Loleta RoseForbach, Aylin Rhoads, MD   Critical care provider statement:    Critical care time  (minutes):  45   Critical care time was exclusive of:  Separately billable procedures and treating other patients   Critical care was necessary to treat or prevent imminent or life-threatening deterioration of the following conditions:  Circulatory failure and respiratory failure   Critical care was time spent personally by me on the following activities:  Development of treatment plan with patient or surrogate, evaluation of patient's response to treatment, examination of patient, obtaining history from patient or surrogate, ordering and performing treatments and interventions, ordering and review of laboratory studies, ordering and review of radiographic studies, pulse oximetry, re-evaluation of patient's condition and review of old charts   MEDICATIONS ORDERED IN ED: Medications  heparin ADULT  infusion 100 units/mL (25000 units/231mL) (1,350 Units/hr Intravenous New Bag/Given 06/12/21 0639)  furosemide (LASIX) injection 20 mg (20 mg Intravenous Given 06/12/21 0649)  heparin bolus via infusion 5,000 Units (5,000 Units Intravenous Bolus from Bag 06/12/21 0639)     IMPRESSION / MDM / ASSESSMENT AND PLAN / ED COURSE  I reviewed the triage vital signs and the nursing notes.                              Differential diagnosis includes, but is not limited to, potentially fatal arrhythmia, electrolyte or metabolic abnormality, ACS, PE, pneumonia.  Patient's presentation is most consistent with acute presentation with potential threat to life or bodily function.  Vital signs are stable and within normal limits.  Initially he appeared to be hypoxemic but I wonder if this was mostly due to difficulty obtaining a good pulse ox reading.  However he is also reporting episodic dyspnea particularly at night and he could be having episodes of hypoxemia.  I verified in his medical record that he has a history of dilated cardiomyopathy and has been on Lasix.  Labs ordered: CBC with differential, CMP, BNP,  coagulation studies, lipase, urinalysis, high-sensitivity troponin, VBG, COVID swab.  As documented above I viewed his chest x-ray and it is not consistent with pulmonary edema.  He has no peripheral edema either and does not appear volume overloaded.  The patient is on the cardiac monitor to evaluate for evidence of arrhythmia and/or significant heart rate changes.  Labs notable for high-sensitivity troponin of 109, concerning for possible NSTEMI or at least demand ischemia.  Other notable labs include a BNP greater than 4500.  Comprehensive metabolic panel shows some acute on chronic kidney disease/kidney injury.  CBC is essentially normal with no leukocytosis.  As documented above, chest x-ray was essentially stable from prior but I was concerned about the possibility of interstitial pneumonia not seen on chest x-ray.  I ordered a CT chest without contrast and it also did not reveal any specific pulmonary issues.  However the radiologist was concerned about possible acute cholecystitis.  I went back and reassessed the patient who is now feeling better on 2 L of oxygen by nasal cannula.  I palpated his abdomen thoroughly, and he has no tenderness to palpation, even laughed when I was pressing up under his right rib cage and said he has no tenderness at all and has not had any nausea or vomiting.  Given the concern seen on the CT scan, I am ordering a right upper quadrant ultrasound but doubt cholecystitis at this time.  He has no leukocytosis and his labs are generally reassuring except for the BNP, high-sensitivity troponin, and the acute kidney injury.  Given his age and comorbidities and concerned that he is having an acute issue that could be life-threatening, I ordered heparin bolus plus infusion, the ultrasound as previously described, and I will consult the hospitalist service for admission.  Clinical Course as of 06/12/21 4098  Sat Jun 12, 2021  0602 SARS Coronavirus 2 by RT PCR: NEGATIVE  [CF]  0621 Consulting hospitalist for admission [CF]  0626 Discussed with Dr. Arville Care of the hospitalist service who will admit. [CF]    Clinical Course User Index [CF] Loleta Rose, MD     FINAL CLINICAL IMPRESSION(S) / ED DIAGNOSES   Final diagnoses:  Acute respiratory failure with hypoxemia (HCC)  Demand ischemia (HCC)  Dilated cardiomyopathy (HCC)  Acute kidney  injury [facility])     Rx / DC Orders   ED Discharge Orders     None        Note:  This document was prepared using Dragon voice recognition software and may include unintentional dictation errors.   Loleta Rose, MD 06/12/21 [phone]

## 2021-06-12 NOTE — ED Notes (Signed)
Patient transported to CT 

## 2021-06-12 NOTE — ED Notes (Signed)
Dr. York Cerise notified of critical troponin

## 2021-06-12 NOTE — ED Notes (Signed)
RN to bedside to respond to bed alarm. Pt was already up and halfway out of the room. Pt redirected back to bed. Pt states "I have been here an hour and ready to go home".

## 2021-06-12 NOTE — Assessment & Plan Note (Addendum)
He has BiV ICD -Continue home amiodarone and Toprol-XL.

## 2021-06-12 NOTE — Consult Note (Signed)
[facility] Cardiology  CARDIOLOGY CONSULT NOTE  Patient ID: Spencer Coleman MRN: [id number] DOB/AGE: [birth year] 88 y.o.  Admit date: 06/12/2021 Referring Physician Blaine Hamper Primary Physician [facility] Primary Cardiologist Alixandrea Milleson Reason for Consultation congestive heart failure  HPI: 88 year old gentleman with history of nonischemic cardiomyopathy, acute on chronic congestive heart failure, BiV ICD, history of ventricular tachycardia (episodes occurred when noncompliant on amiodarone), and paroxysmal atrial fibrillation, referred for evaluation of worsening CHF.  On questioning, patient reports he presented to [facility] ED primarily for vision changes.  He reports that he intermittently experiences lack of vision without lack of consciousness.  The patient appears confused, ports that he has been here for several days versus several hours.  Its not clear that he knows that he is in the emergency room.  He did recognize me since I've been his cardiologist for several years.  He currently denies chest pain or shortness of breath.  ECG reveals atrial sensing with ventricular pacing at 77 bpm with intermittent premature ventricular contractions which is his baseline.  Admission labs were notable for mildly elevated troponin (109, 121, 146) in the absence of chest pain.  BNP was greater than 4500.  BUN and creatinine were 36 and 1.99, respectively.  Review of systems complete and found to be negative unless listed above     Past Medical History:  Diagnosis Date   CAD (coronary artery disease)    Nonobstructive cardiac disease by cath   CHF (congestive heart failure) (HCC)    nonischemic cardiomyopathy   CKD (chronic kidney disease)    History of kidney stones    Hyperlipidemia    Hypertension    Inguinal hernia    Patella fracture [id number]   Renal colic [id number]   Ventricular tachycardia [facility])    s/p ICD    Past Surgical History:  Procedure Laterality Date   CARDIAC CATHETERIZATION     CARDIAC DEFIBRILLATOR  PLACEMENT  2009   CYSTOSCOPY WITH LITHOLAPAXY N/A 02/21/2018   Procedure: CYSTOSCOPY WITH LITHOLAPAXY;  Surgeon: Billey Co, MD;  Location: [facility] ORS;  Service: Urology;  Laterality: N/A;   ELBOW SURGERY Left    HOLMIUM LASER APPLICATION N/A [id number]   Procedure: HOLMIUM LASER APPLICATION;  Surgeon: Billey Co, MD;  Location: [facility] ORS;  Service: Urology;  Laterality: N/A;   ICD GENERATOR CHANGEOUT N/A 01/28/2021   Procedure: ICD GENERATOR CHANGEOUT;  Surgeon: Isaias Cowman, MD;  Location: [place] CV LAB;  Service: Cardiovascular;  Laterality: N/A;   INGUINAL HERNIA REPAIR     LITHOTRIPSY     ORIF left wrist fracture Left    ORIF PATELLA Left 01/10/2015   Procedure: OPEN REDUCTION INTERNAL (ORIF) FIXATION PATELLA;  Surgeon: Dereck Leep, MD;  Location: [facility] ORS;  Service: Orthopedics;  Laterality: Left;   ORIF PATELLA Left 02/06/2015   Procedure: OPEN REDUCTION INTERNAL (ORIF) FIXATION PATELLA;  Surgeon: Dereck Leep, MD;  Location: [facility] ORS;  Service: Orthopedics;  Laterality: Left;   TRANSURETHRAL RESECTION OF PROSTATE N/A 02/21/2018   Procedure: TRANSURETHRAL RESECTION OF THE PROSTATE (TURP);  Surgeon: Billey Co, MD;  Location: [facility] ORS;  Service: Urology;  Laterality: N/A;    (Not in a hospital admission)  Social History   Socioeconomic History   Marital status: Single    Spouse name: Not on file   Number of children: Not on file   Years of education: Not on file   Highest education level: Not on file  Occupational History   Occupation: retired  Tobacco Use  Smoking status: Never   Smokeless tobacco: Never  Vaping Use   Vaping Use: Never used  Substance and Sexual Activity   Alcohol use: No    Alcohol/week: 0.0 standard drinks   Drug use: No   Sexual activity: Not on file  Other Topics Concern   Not on file  Social History Narrative   Lives at home. Independent, no assisted devices at baseline   Social Determinants of Health    Financial Resource Strain: Not on file  Food Insecurity: Not on file  Transportation Needs: Not on file  Physical Activity: Not on file  Stress: Not on file  Social Connections: Not on file  Intimate Partner Violence: Not on file    Family History  Problem Relation Age of Onset   CAD Mother    Kidney cancer Father    Prostate cancer Neg Hx    Bladder Cancer Neg Hx       Review of systems complete and found to be negative unless listed above      PHYSICAL EXAM  General: Well developed, well nourished, in no acute distress HEENT:  Normocephalic and atramatic Neck:  No JVD.  Lungs: Clear bilaterally to auscultation and percussion. Heart: HRRR . Normal S1 and S2 without gallops or murmurs.  Abdomen: Bowel sounds are positive, abdomen soft and non-tender  Msk:  Back normal, normal gait. Normal strength and tone for age. Extremities: No clubbing, cyanosis or edema.   Neuro: Alert and oriented X 3. Psych:  Good affect, responds appropriately  Labs:   Lab Results  Component Value Date   WBC 6.0 06/12/2021   HGB 14.6 06/12/2021   HCT 46.3 06/12/2021   MCV 94.5 06/12/2021   PLT 139 (L) 06/12/2021    Recent Labs  Lab 06/12/21 0433  NA 140  K 4.7  CL 108  CO2 22  BUN 36*  CREATININE 1.99*  CALCIUM 9.3  PROT 6.2*  BILITOT 1.7*  ALKPHOS 70  ALT 25  AST 34  GLUCOSE 171*   Lab Results  Component Value Date   TROPONINI <0.03 03/02/2018    Lab Results  Component Value Date   CHOL 149 06/12/2021   Lab Results  Component Value Date   HDL 46 06/12/2021   Lab Results  Component Value Date   LDLCALC 98 06/12/2021   Lab Results  Component Value Date   TRIG 27 06/12/2021   Lab Results  Component Value Date   CHOLHDL 3.2 06/12/2021   No results found for: LDLDIRECT    Radiology: CT Chest Wo Contrast  Result Date: 06/12/2021 CLINICAL DATA:  88 year old male with history of respiratory illness. Abnormal chest x-ray. EXAM: CT CHEST WITHOUT CONTRAST  TECHNIQUE: Multidetector CT imaging of the chest was performed following the standard protocol without IV contrast. RADIATION DOSE REDUCTION: This exam was performed according to the departmental dose-optimization program which includes automated exposure control, adjustment of the mA and/or kV according to patient size and/or use of iterative reconstruction technique. COMPARISON:  Chest CT 06/07/2007. FINDINGS: Cardiovascular: Heart size is mildly enlarged. There is no significant pericardial fluid, thickening or pericardial calcification. There is aortic atherosclerosis, as well as atherosclerosis of the great vessels of the mediastinum and the coronary arteries, including calcified atherosclerotic plaque in the left main, left anterior descending, left circumflex and right coronary arteries. Left-sided biventricular pacemaker/AICD with lead tips terminating in the right atrium, right ventricular apex and overlying the lateral wall the left ventricle via the coronary sinus and coronary veins.  Calcifications of the aortic valve. Mediastinum/Nodes: No pathologically enlarged mediastinal or hilar lymph nodes. Esophagus is unremarkable in appearance. No axillary lymphadenopathy. Lungs/Pleura: Areas of scarring and/or atelectasis in the right lung base. No acute consolidative airspace disease. No pleural effusions. No suspicious appearing pulmonary nodules or masses are noted. Upper Abdomen: Severe elevation of the right hemidiaphragm again noted. Calcified gallstones in the neck of the gallbladder. Gallbladder appears nearly decompressed, however, gallbladder wall appears thickened and edematous with trace volume of pericholecystic fluid, concerning for probable acute cholecystitis. Incompletely imaged well-circumscribed exophytic lesion in the upper pole of the left kidney is low-attenuation (statistically likely a cyst, no imaging follow-up recommended) measuring at least 5.3 cm in diameter. Extensive  atherosclerosis of the abdominal aorta and visualized mesenteric vasculature. Musculoskeletal: There are no aggressive appearing lytic or blastic lesions noted in the visualized portions of the skeleton. IMPRESSION: 1. Cholelithiasis with evidence of cholecystitis. Further clinical evaluation is recommended. This could be better evaluated with right upper quadrant abdominal ultrasound if clinically appropriate. 2. Severe chronic elevation of the right hemidiaphragm redemonstrated. No acute findings in the thorax otherwise noted to account for the patient's symptoms. 3. Mild cardiomegaly. 4. Aortic atherosclerosis, in addition to left main and three-vessel coronary artery disease. 5. There are calcifications of the aortic valve. Echocardiographic correlation for evaluation of potential valvular dysfunction may be warranted if clinically indicated. Aortic Atherosclerosis (ICD10-I70.0). Electronically Signed   By: Vinnie Langton M.D.   On: 06/12/2021 05:40   DG Chest Port 1 View  Result Date: 06/12/2021 CLINICAL DATA:  88 year old male with history of acute dyspnea and hypoxemia. Difficulty breathing for the past 3 days. EXAM: PORTABLE CHEST 1 VIEW COMPARISON:  Chest x-ray 01/06/2021. FINDINGS: Worsening severe chronic elevation of the right hemidiaphragm. Opacity at the right base which likely reflects passive atelectasis. Left lung appears clear. No pleural effusions. No pneumothorax. No evidence of pulmonary edema. Heart size appears mildly enlarged. Upper mediastinal contours are within normal limits allowing for patient positioning. Atherosclerotic calcifications in the thoracic aorta. Left-sided biventricular pacemaker/AICD with lead tips projecting over the expected location of the right atrium, right ventricle and left ventricle via the coronary sinus and coronary veins. IMPRESSION: 1. Severe worsening chronic elevation of the right hemidiaphragm with probable passive subsegmental atelectasis in the right  lung base. 2. Mild cardiomegaly. 3. Aortic atherosclerosis. Electronically Signed   By: Vinnie Langton M.D.   On: 06/12/2021 05:02   US ABDOMEN LIMITED RUQ (LIVER/GB)  Result Date: 06/12/2021 CLINICAL DATA:  88 year old male with gallstones and evidence of gallbladder inflammation on noncontrast chest CT this morning. EXAM: ULTRASOUND ABDOMEN LIMITED RIGHT UPPER QUADRANT COMPARISON:  Chest CT 0525 hours today. FINDINGS: Suboptimal exam due to elevated right hemidiaphragm, acoustic shadowing of the liver by the lung. Gallbladder: Thickened gallbladder wall up to 5 mm. No pericholecystic fluid. Gallstones in the neck of the gallbladder remote better demonstrated by CT this morning. Reportedly no sonographic Murphy sign was elicited. Common bile duct: Diameter: 3 mm, normal. Liver: No focal lesion identified. Within normal limits in parenchymal echogenicity. Portal vein is patent on color Doppler imaging with normal direction of blood flow towards the liver. Other: No free fluid identified. IMPRESSION: 1. Confirmed abnormal gallbladder wall thickening. But limited ultrasound visualization of the gallbladder due to elevated right hemidiaphragm. Gallstones in the gallbladder neck much better demonstrated by CT today. Despite absent sonographic Murphy sign the constellation is Highly Suspicious For Acute Cholecystitis. 2. No evidence of bile duct obstruction. Electronically Signed  By: Genevie Ann M.D.   On: 06/12/2021 07:09    EKG: Atrial sensing with ventricular pacing at 77 bpm  ASSESSMENT AND PLAN:   1.  Acute on chronic systolic congestive heart failure, HFrEF LVEF 40%, does not appear to be fluid overloaded, chest x-ray does not reveal pulmonary edema, BNP greater than 4500 2.  Mildly elevated troponin (109, 121, 146), in the absence of chest pain, with previous cardiac catheterization revealing insignificant coronary artery disease 3.  BiV ICD 08/09/2007, status post generator change out 01/28/2021,  interrogation 04/29/2021 revealed normal function, with 1 nonsustained episode of VT without device therapy 4.  History of ventricular tachycardia, previous shocks 03/09/2016, 12/29/2020, with history of noncompliance with amiodarone 5.  Paroxysmal atrial fibrillation, on low-dose Eliquis for stroke prevention, and amiodarone for rate and rhythm control  Recommendations  1.  DC heparin 2.  Resume Eliquis 2.5 mg twice daily 3.  Resume metoprolol succinate 25 mg daily 4.  Resume amiodarone 200 mg daily 5.  Consider neurological evaluation including head CT 6.  Reviewed 2D echocardiogram 7.  BiV ICD interrogation 8.  Further recommendations pending patient's initial clinical course, neurological evaluation, and 2D echocardiogram  Signed: Isaias Cowman MD,PhD, [facility] 06/12/2021, 11:49 AM

## 2021-06-12 NOTE — ED Notes (Signed)
Cardiology at bedside.

## 2021-06-12 NOTE — ED Triage Notes (Signed)
Pt c/o of difficulty breathing x 3 days. EMS called due to shortness of breathe tonight. SPO2 reading 89% room air. Worse with exerction. Denies any chest pain. Pacemaker in place.

## 2021-06-12 NOTE — Assessment & Plan Note (Addendum)
Continue home amiodarone, Toprol-XL and Eliquis Optimize electrolytes

## 2021-06-12 NOTE — Assessment & Plan Note (Addendum)
Normotensive for most part. -Discontinue as needed hydralazine given history of CAD

## 2021-06-12 NOTE — Assessment & Plan Note (Addendum)
Likely demand ischemia.  TTE as above. -Cardiology on board

## 2021-06-12 NOTE — Assessment & Plan Note (Addendum)
TTE as above.  BNP > 4500 (from 820 in 12/2020).  CXR with mild cardiomegaly.  He denies respiratory distress this morning but limited insight into why he came to the hospital.  He has trace BLE edema, right> left.  Also bibasilar crackles on exam.  No apparent JVD but sitting upright.  On p.o. Lasix 20 mg daily at home.  About 600 cc UOP overnight.  Renal function improved. -Continue IV Lasix 20 mg twice daily -GDMT-continue home Toprol-XL.  Defer other GDMT to cardiology -Monitor intake and output, renal functions and electrolytes -Sodium and fluid restriction -PT/OT evaluation

## 2021-06-12 NOTE — Assessment & Plan Note (Addendum)
Acute metabolic encephalopathy ruled out.  CT head without acute finding.  No focal neuro symptoms.  He is oriented x4 but limited insight.  No focal neurodeficits.  Low suspicion for infection. -Reorientation and delirium precautions -Avoid or minimize sedating medications

## 2021-06-12 NOTE — Progress Notes (Signed)
ANTICOAGULATION CONSULT NOTE  Pharmacy Consult for heparin infusion Indication: nSTEMI vs PE  No Known Allergies  Patient Measurements: Height: 5\' 10"  (177.8 cm) Weight: 83.5 kg (184 lb) IBW/kg (Calculated) : 73 Heparin Dosing Weight: 83.5 kg  Vital Signs: Temp: 97.5 F (36.4 C) (06/03 0439) Temp Source: Oral (06/03 0439) BP: 115/100 (06/03 0500) Pulse Rate: 69 (06/03 0500)  Labs: Recent Labs    06/12/21 0433  HGB 14.6  HCT 46.3  PLT 139*  LABPROT 14.0  INR 1.1  CREATININE 1.99*  TROPONINIHS 109*    Estimated Creatinine Clearance: 26.5 mL/min (A) (by C-G formula based on SCr of 1.99 mg/dL (H)).   Medical History: Past Medical History:  Diagnosis Date   CAD (coronary artery disease)    Nonobstructive cardiac disease by cath   CHF (congestive heart failure) (HCC)    nonischemic cardiomyopathy   CKD (chronic kidney disease)    History of kidney stones    Hyperlipidemia    Hypertension    Inguinal hernia    Patella fracture 01/09/2015   Renal colic 10/07/2011   Ventricular tachycardia (HCC)    s/p ICD    Medications:  PTA med hx includes Eliquis 2.5 BID  Assessment: Pt is 88 yo male with h/o CVD including pacemake, presenting to ED c/o breathing difficulty x 3 days, worsening with exertion.    Goal of Therapy:  Heparin level 0.3-0.7 units/ml aPTT 66-102 seconds Monitor platelets by anticoagulation protocol: Yes   Plan:  Bolus 5000 units x 1 Start heparin infusion at 1350 units/hr Will check BL labs to determine to follow aPTT or HL Will recheck aPTT or HL in 8 hrs after start of infusion CBC daily while on heparin.  98, PharmD, [facility] 06/12/2021 6:38 AM

## 2021-06-12 NOTE — Assessment & Plan Note (Addendum)
Seems chronic.  Stable. -Monitor

## 2021-06-12 NOTE — Assessment & Plan Note (Addendum)
Recent Labs    01/04/21 1540 01/06/21 1542 01/07/21 0010 06/12/21 0433 06/13/21 0556  BUN 23 19 18  36* 32*  CREATININE 1.67* 1.40* 1.21 1.99* 1.52*  Renal function improving with diuretics.  Could have cardiorenal renal syndrome -Continue monitoring

## 2021-06-12 NOTE — Consult Note (Signed)
[facility] SURGICAL CONSULTATION NOTE (initial) - cpt: 99254  HISTORY OF PRESENT ILLNESS (HPI):  88 y.o. male presented to [facility] ED today for evaluation of shortness of breath. Patient reports multi day history of progressively worsening shortness of breath.  He denies fevers and chills, he denies nausea, vomiting, abdominal pain or bowel habit changes.  He reports a mild dry cough.  He has significant concerns about his heart condition.  Surgery is consulted by hospitalist physician Dr. Blaine Hamper in this context for evaluation and management of abnormal imaging, suggesting acute calculus cholecystitis.  PAST MEDICAL HISTORY (PMH):  Past Medical History:  Diagnosis Date   CAD (coronary artery disease)    Nonobstructive cardiac disease by cath   CHF (congestive heart failure) (HCC)    nonischemic cardiomyopathy   CKD (chronic kidney disease)    History of kidney stones    Hyperlipidemia    Hypertension    Inguinal hernia    Patella fracture [id number]   Renal colic [id number]   Ventricular tachycardia ([place])    s/p ICD     PAST SURGICAL HISTORY ([place]):  Past Surgical History:  Procedure Laterality Date   CARDIAC CATHETERIZATION     CARDIAC DEFIBRILLATOR PLACEMENT  2009   CYSTOSCOPY WITH LITHOLAPAXY N/A 02/21/2018   Procedure: CYSTOSCOPY WITH LITHOLAPAXY;  Surgeon: Billey Co, MD;  Location: [facility] ORS;  Service: Urology;  Laterality: N/A;   ELBOW SURGERY Left    HOLMIUM LASER APPLICATION N/A [id number]   Procedure: HOLMIUM LASER APPLICATION;  Surgeon: Billey Co, MD;  Location: [facility] OR[facility];  Service: Urology;  Laterality: N/A;   ICD GENERATOR CHANGEOUT N/A 01/28/2021   Procedure: ICD GENERATOR CHANGEOUT;  Surgeon: Isaias Cowman, MD;  Location: [facility];  Service: Cardiovascular;  Laterality: N/A;   INGUINAL HERNIA REPAIR     LITHOTRIPSY     ORIF left wrist fracture Left    ORIF PATELLA Left 01/10/2015   Procedure: OPEN REDUCTION INTERNAL (ORIF)  FIXATION PATELLA;  Surgeon: Dereck Leep, MD;  Location: [facility] ORS;  Service: Orthopedics;  Laterality: Left;   ORIF PATELLA Left 02/06/2015   Procedure: OPEN REDUCTION INTERNAL (ORIF) FIXATION PATELLA;  Surgeon: Dereck Leep, MD;  Location: [facility] ORS;  Service: Orthopedics;  Laterality: Left;   TRANSURETHRAL RESECTION OF PROSTATE N/A 02/21/2018   Procedure: TRANSURETHRAL RESECTION OF THE PROSTATE (TURP);  Surgeon: Billey Co, MD;  Location: [facility] OR[facility];  Service: Urology;  Laterality: N/A;     MEDICATIONS:  Prior to Admission medications   Medication Sig Start Date End Date Taking? Authorizing Provider  amiodarone (PACERONE) 400 MG tablet Take 400 mg by mouth daily.   Yes [provider]  furosemide (LASIX) 20 MG tablet Take 20 mg by mouth daily. 11/09/20  Yes [provider]  metoprolol succinate (TOPROL-XL) 25 MG 24 hr tablet Take 25 mg by mouth daily. 03/17/21  Yes [provider]  simvastatin (ZOCOR) 40 MG tablet Take 40 mg by mouth at bedtime. 11/09/20  Yes [provider]  acetaminophen (TYLENOL) 500 MG tablet Take 2 tablets (1,000 mg total) by mouth every 6 (six) hours. 02/22/18   Billey Co, MD     ALLERGIES:  No Known Allergies   SOCIAL HISTORY:  Social History   Socioeconomic History   Marital status: Single    Spouse name: Not on file   Number of children: Not on file   Years of education: Not on file   Highest education level: Not on file  Occupational History   Occupation: retired  Tobacco Use   Smoking status: Never   Smokeless tobacco: Never  Vaping Use   Vaping Use: Never used  Substance and Sexual Activity   Alcohol use: No    Alcohol/week: 0.0 standard drinks   Drug use: No   Sexual activity: Not on file  Other Topics Concern   Not on file  Social History Narrative   Lives at home. Independent, no assisted devices at baseline   Social Determinants of Health   Financial Resource Strain: Not on file  Food  Insecurity: Not on file  Transportation Needs: Not on file  Physical Activity: Not on file  Stress: Not on file  Social Connections: Not on file  Intimate Partner Violence: Not on file     FAMILY HISTORY:  Family History  Problem Relation Age of Onset   CAD Mother    Kidney cancer Father    Prostate cancer Neg Hx    Bladder Cancer Neg Hx       REVIEW OF SYSTEMS:  Review of Systems  Constitutional:  Negative for chills, fever and malaise/fatigue.  HENT:  Negative for hearing loss and sore throat.   Eyes:  Negative for blurred vision.  Respiratory:  Positive for cough and shortness of breath. Negative for wheezing.   Cardiovascular:  Positive for palpitations and leg swelling. Negative for chest pain.  Gastrointestinal:  Negative for abdominal pain, constipation, diarrhea, nausea and vomiting.  Genitourinary:  Negative for dysuria, frequency and hematuria.  Neurological:  Negative for tingling and focal weakness.  Endo/Heme/Allergies:  Does not bruise/bleed easily.     VITAL SIGNS:  Temp:  [97.5 F (36.4 C)] 97.5 F (36.4 C) (06/03 0439) Pulse Rate:  [68-76] 71 (06/03 1200) Resp:  [12-21] 19 (06/03 1200) BP: (115-150)/(82-105) 145/82 (06/03 1200) SpO2:  [95 %-99 %] 95 % (06/03 1200) Weight:  [83.5 kg] 83.5 kg (06/03 0435)     Height: 5\' 10"  (177.8 cm) Weight: 83.5 kg BMI (Calculated): 26.4   INTAKE/OUTPUT:  No intake/output data recorded.  PHYSICAL EXAM:  Physical Exam Blood pressure (!) 145/82, pulse 71, temperature (!) 97.5 F (36.4 C), temperature source Oral, resp. rate 19, height 5\' 10"  (1.778 m), weight 83.5 kg, SpO2 95 %. Last Weight  Most recent update: 06/12/2021  4:36 AM    Weight  83.5 kg (184 lb)             CONSTITUTIONAL: Well developed, and nourished, appropriately responsive and aware without distress.   EYES: Sclera non-icteric.   EARS, NOSE, MOUTH AND THROAT:  The oropharynx is clear. Oral mucosa is pink and moist.    Hearing is intact to  voice.  NECK: Trachea is midline, and there is jugular venous distension.  LYMPH NODES:  Lymph nodes in the neck are not enlarged. RESPIRATORY:  Normal respiratory effort without pathologic use of accessory muscles. CARDIOVASCULAR: Heart is regular in rate and rhythm. GI: The abdomen is  soft, nontender, and nondistended. There were no palpable masses. I did not appreciate hepatosplenomegaly. There were normal bowel sounds. MUSCULOSKELETAL:  Symmetrical muscle tone appreciated in all four extremities.    SKIN: Skin turgor is normal. No pathologic skin lesions appreciated.  NEUROLOGIC:  Motor and sensation appear grossly normal.  Cranial nerves are grossly without defect. PSYCH:  Alert and oriented to person, place and time. Affect is appropriate for situation.  Data Reviewed I have personally reviewed what is currently available of the patient's imaging, recent labs and medical  records.    Labs:     Latest Ref Rng & Units 06/12/2021    4:33 AM 01/07/2021   12:10 AM 01/06/2021    3:42 PM  CBC  WBC 4.0 - 10.5 K/uL 6.0   9.0   9.1    Hemoglobin 13.0 - 17.0 g/dL 14.6   13.9   14.6    Hematocrit 39.0 - 52.0 % 46.3   41.8   44.9    Platelets 150 - 400 K/uL 139   180   163        Latest Ref Rng & Units 06/12/2021    4:33 AM 01/07/2021   12:10 AM 01/06/2021    3:42 PM  CMP  Glucose 70 - 99 mg/dL 171   111   129    BUN 8 - 23 mg/dL 36   18   19    Creatinine 0.61 - 1.24 mg/dL 1.99   1.21   1.40    Sodium 135 - 145 mmol/L 140   136   136    Potassium 3.5 - 5.1 mmol/L 4.7   4.5   3.2    Chloride 98 - 111 mmol/L 108   104   102    CO2 22 - 32 mmol/L 22   22   27     Calcium 8.9 - 10.3 mg/dL 9.3   8.5   8.6    Total Protein 6.5 - 8.1 g/dL 6.2    6.4    Total Bilirubin 0.3 - 1.2 mg/dL 1.7    1.8    Alkaline Phos 38 - 126 U/L 70    68    AST 15 - 41 U/L 34    21    ALT 0 - 44 U/L 25    13       Imaging studies:   Last 24 hrs: CT Chest Wo Contrast  Result Date: 06/12/2021 CLINICAL DATA:   88 year old male with history of respiratory illness. Abnormal chest x-ray. EXAM: CT CHEST WITHOUT CONTRAST TECHNIQUE: Multidetector CT imaging of the chest was performed following the standard protocol without IV contrast. RADIATION DOSE REDUCTION: This exam was performed according to the departmental dose-optimization program which includes automated exposure control, adjustment of the mA and/or kV according to patient size and/or use of iterative reconstruction technique. COMPARISON:  Chest CT 06/07/2007. FINDINGS: Cardiovascular: Heart size is mildly enlarged. There is no significant pericardial fluid, thickening or pericardial calcification. There is aortic atherosclerosis, as well as atherosclerosis of the great vessels of the mediastinum and the coronary arteries, including calcified atherosclerotic plaque in the left main, left anterior descending, left circumflex and right coronary arteries. Left-sided biventricular pacemaker/AICD with lead tips terminating in the right atrium, right ventricular apex and overlying the lateral wall the left ventricle via the coronary sinus and coronary veins. Calcifications of the aortic valve. Mediastinum/Nodes: No pathologically enlarged mediastinal or hilar lymph nodes. Esophagus is unremarkable in appearance. No axillary lymphadenopathy. Lungs/Pleura: Areas of scarring and/or atelectasis in the right lung base. No acute consolidative airspace disease. No pleural effusions. No suspicious appearing pulmonary nodules or masses are noted. Upper Abdomen: Severe elevation of the right hemidiaphragm again noted. Calcified gallstones in the neck of the gallbladder. Gallbladder appears nearly decompressed, however, gallbladder wall appears thickened and edematous with trace volume of pericholecystic fluid, concerning for probable acute cholecystitis. Incompletely imaged well-circumscribed exophytic lesion in the upper pole of the left kidney is low-attenuation (statistically  likely a cyst, no imaging follow-up recommended) measuring  at least 5.3 cm in diameter. Extensive atherosclerosis of the abdominal aorta and visualized mesenteric vasculature. Musculoskeletal: There are no aggressive appearing lytic or blastic lesions noted in the visualized portions of the skeleton. IMPRESSION: 1. Cholelithiasis with evidence of cholecystitis. Further clinical evaluation is recommended. This could be better evaluated with right upper quadrant abdominal ultrasound if clinically appropriate. 2. Severe chronic elevation of the right hemidiaphragm redemonstrated. No acute findings in the thorax otherwise noted to account for the patient's symptoms. 3. Mild cardiomegaly. 4. Aortic atherosclerosis, in addition to left main and three-vessel coronary artery disease. 5. There are calcifications of the aortic valve. Echocardiographic correlation for evaluation of potential valvular dysfunction may be warranted if clinically indicated. Aortic Atherosclerosis (ICD10-I70.0). Electronically Signed   By: Vinnie Langton M.D.   On: 06/12/2021 05:40   DG Chest Port 1 View  Result Date: 06/12/2021 CLINICAL DATA:  88 year old male with history of acute dyspnea and hypoxemia. Difficulty breathing for the past 3 days. EXAM: PORTABLE CHEST 1 VIEW COMPARISON:  Chest x-ray 01/06/2021. FINDINGS: Worsening severe chronic elevation of the right hemidiaphragm. Opacity at the right base which likely reflects passive atelectasis. Left lung appears clear. No pleural effusions. No pneumothorax. No evidence of pulmonary edema. Heart size appears mildly enlarged. Upper mediastinal contours are within normal limits allowing for patient positioning. Atherosclerotic calcifications in the thoracic aorta. Left-sided biventricular pacemaker/AICD with lead tips projecting over the expected location of the right atrium, right ventricle and left ventricle via the coronary sinus and coronary veins. IMPRESSION: 1. Severe worsening chronic  elevation of the right hemidiaphragm with probable passive subsegmental atelectasis in the right lung base. 2. Mild cardiomegaly. 3. Aortic atherosclerosis. Electronically Signed   By: Vinnie Langton M.D.   On: 06/12/2021 05:02   US ABDOMEN LIMITED RUQ (LIVER/GB)  Result Date: 06/12/2021 CLINICAL DATA:  88 year old male with gallstones and evidence of gallbladder inflammation on noncontrast chest CT this morning. EXAM: ULTRASOUND ABDOMEN LIMITED RIGHT UPPER QUADRANT COMPARISON:  Chest CT 0525 hours today. FINDINGS: Suboptimal exam due to elevated right hemidiaphragm, acoustic shadowing of the liver by the lung. Gallbladder: Thickened gallbladder wall up to 5 mm. No pericholecystic fluid. Gallstones in the neck of the gallbladder remote better demonstrated by CT this morning. Reportedly no sonographic Murphy sign was elicited. Common bile duct: Diameter: 3 mm, normal. Liver: No focal lesion identified. Within normal limits in parenchymal echogenicity. Portal vein is patent on color Doppler imaging with normal direction of blood flow towards the liver. Other: No free fluid identified. IMPRESSION: 1. Confirmed abnormal gallbladder wall thickening. But limited ultrasound visualization of the gallbladder due to elevated right hemidiaphragm. Gallstones in the gallbladder neck much better demonstrated by CT today. Despite absent sonographic Murphy sign the constellation is Highly Suspicious For Acute Cholecystitis. 2. No evidence of bile duct obstruction. Electronically Signed   By: Genevie Ann M.D.   On: 06/12/2021 07:09     Assessment/Plan:  88 y.o. male with CT scan and ultrasound imaging consistent with acute calculus cholecystitis, however no Murphy's scan on direct sonographic exam, no appreciable clinical evidence on my exam, and complicated by pertinent comorbidities including:  Patient Active Problem List   Diagnosis Date Noted   Acute on chronic systolic CHF (congestive heart failure) ([place]) 06/12/2021    Acute renal failure superimposed on stage 3a chronic kidney disease ([place]) 06/12/2021   Gallstone 06/12/2021   Elevated troponin 06/12/2021   Thrombocytopenia ([place]) 06/12/2021   CAD (coronary artery disease) [id number]   Acute metabolic encephalopathy  06/12/2021   Hx subclinical atrial fibrillation on ICD interrogation, concern for Afib/RVR  01/06/2021   CKD (chronic kidney disease) stage 3, GFR 30-59 ml/min (HCC) 01/06/2021   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   Closed displaced fracture of coronoid process of left ulna 06/28/2016   Malnutrition of moderate degree 02/08/2015   Calculus of kidney 01/09/2015   Congestive heart failure ([place]) 01/09/2015   Cardiomyopathy ([place]) 06/06/2013   History of cardiac catheterization 06/06/2013   Essential hypertension 06/06/2013   HLD (hyperlipidemia) 06/06/2013   ACE inhibitor intolerance 06/06/2013   Ventricular tachycardia 04/05/2013   SVT (supraventricular tachycardia) ([place]) 04/05/2013   Automatic implantable cardioverter-defibrillator in situ 03/25/2013   Calculus in bladder 08/07/2012   Gross hematuria 08/07/2012   Incomplete emptying of bladder 08/07/2012   Acute cystitis 10/07/2011   Benign localized prostatic hyperplasia with lower urinary tract symptoms (LUTS) 10/07/2011    -Options for ruling out, or ruling in acute cholecystitis include proceeding with HIDA scan.  My clinical suspicion certainly does not warrant proceeding with this.  -Treatment options would include antibiotics with or without further work-up.  But at present he does not appear to be septic in any way.  -Percutaneous cholecystostomy may be preferred over surgery should acute calculus cholecystitis be confirmed, considering his operative risk.  - DVT prophylaxis  All of the above findings and recommendations were discussed with the patient, and all of patient's questions were answered to their expressed satisfaction.  Thank you for the  opportunity to participate in this patient's care.  We will follow with you.  -- Ronny Bacon, M.D., FACS 06/12/2021, 12:47 PM

## 2021-06-12 NOTE — Assessment & Plan Note (Addendum)
CT and RUQ [place] raises concern for calculus cholecystitis. Cholecystitis ruled out.  No clinical signs.  General surgery signed off

## 2021-06-12 NOTE — Assessment & Plan Note (Signed)
-   Zocor 

## 2021-06-12 NOTE — Assessment & Plan Note (Addendum)
Denies chest pain.  Mild elevated troponin likely demand ischemia.  No acute ischemic finding on EKG.  No RWMA on echocardiogram.  -Continue Toprol-XL -Changed Zocor to Lipitor due to risk of rhabdo with amiodarone -Continue home Eliquis and aspirin.

## 2021-06-13 ENCOUNTER — Inpatient Hospital Stay
Admit: 2021-06-13 | Discharge: 2021-06-13 | Disposition: A | Discharge status: Home / Self Care | Payer: Medicare HMO | Attending: Internal Medicine | Admitting: Internal Medicine

## 2021-06-13 DIAGNOSIS — I25119 Atherosclerotic heart disease of native coronary artery with unspecified angina pectoris: Secondary | ICD-10-CM

## 2021-06-13 DIAGNOSIS — R5381 Other malaise: Secondary | ICD-10-CM

## 2021-06-13 DIAGNOSIS — R410 Disorientation, unspecified: Secondary | ICD-10-CM

## 2021-06-13 DIAGNOSIS — K8020 Calculus of gallbladder without cholecystitis without obstruction: Secondary | ICD-10-CM

## 2021-06-13 DIAGNOSIS — I480 Paroxysmal atrial fibrillation: Secondary | ICD-10-CM

## 2021-06-13 LAB — ECHOCARDIOGRAM COMPLETE
AR max vel: 0.69 cm2
AV Area VTI: 0.86 cm2
AV Area mean vel: 0.65 cm2
AV Mean grad: 6 mmHg
AV Peak grad: 9.9 mmHg
Ao pk vel: 1.57 m/s
Area-P 1/2: 4.54 cm2
Height: 70 in
MV VTI: 0.98 cm2
P 1/2 time: 528 msec
S' Lateral: 5.21 cm
Weight: 2732.8 oz

## 2021-06-13 LAB — BASIC METABOLIC PANEL
Anion gap: 5 (ref 5–15)
BUN: 32 mg/dL — ABNORMAL HIGH (ref 8–23)
CO2: 26 mmol/L (ref 22–32)
Calcium: 8.8 mg/dL — ABNORMAL LOW (ref 8.9–10.3)
Chloride: 106 mmol/L (ref 98–111)
Creatinine, Ser: 1.52 mg/dL — ABNORMAL HIGH (ref 0.61–1.24)
GFR, Estimated: 44 mL/min — ABNORMAL LOW (ref >60)
Glucose, Bld: 84 mg/dL (ref 70–99)
Potassium: 3.5 mmol/L (ref 3.5–5.1)
Sodium: 137 mmol/L (ref 135–145)

## 2021-06-13 LAB — MAGNESIUM: Magnesium: 2.5 mg/dL — ABNORMAL HIGH (ref 1.7–2.4)

## 2021-06-13 MED ORDER — METOPROLOL SUCCINATE ER 50 MG PO TB24
50.0000 mg | ORAL_TABLET | Freq: Every day | ORAL | Status: DC
  Administered 2021-06-14: 50 mg via ORAL
  Filled 2021-06-13: qty 1

## 2021-06-13 MED ORDER — ATORVASTATIN CALCIUM 20 MG PO TABS
20.0000 mg | ORAL_TABLET | Freq: Every day | ORAL | Status: DC
Start: 2021-06-13 — End: 2021-06-14
  Administered 2021-06-13: 20 mg via ORAL
  Filled 2021-06-13: qty 1

## 2021-06-13 NOTE — Progress Notes (Signed)
Pt up and walking around the nurses station with pants and shoes on stating that he was ready to go home. Safety sitter at side. This Rn and Recruitment consultant trying to redirect patient. Pt states, " Now look, you all are just trying to keep me here now, I'm ready to go home."

## 2021-06-13 NOTE — Assessment & Plan Note (Signed)
PT/OT eval ?

## 2021-06-13 NOTE — Progress Notes (Signed)
PROGRESS NOTE  Spencer Coleman [id number] DOB: 1930/04/13   PCP: Tracie Harrier, MD  Patient is from: Home.  Lives alone.  Uses walker at times.  DOA: 06/12/2021 LOS: 1  Chief complaints Chief Complaint  Patient presents with   Shortness of Breath     Brief Narrative / Interim history: 88 year old M with PMH of combined CHF, VT s/p BiV ICD, CKD-3A, CAD, BPH, HTN, HLD and thrombocytopenia presenting with increased shortness of breath and palpitation, and admitted for acute on chronic combined CHF and AKI.  BNP > 4500 (821 in 12/2020).  Slightly elevated troponin without significant delta.  EKG sinus rhythm with RBBB but no acute ischemic finding.  CT abdomen and pelvis on RUQ [place] raises concern for calculus cholecystitis although patient had no GI symptoms, fever, leukocytosis or RUQ tenderness to suggest cholecystitis.  Patient was started on IV Lasix.  Cardiology and general surgery consulted.  Acute cholecystitis ruled out.  On IV Lasix for acute CHF.  TTE with LVEF of 20 to 25%, G1 DD, moderate to severe MVR, mild to moderate TVR but no RWMA, G1 DD.  Remains on IV Lasix.  Cardiology following.  Could be discharged if cleared by cardiology and therapy.    Subjective: Seen and examined earlier this morning.  Patient was agitated pulling his telemetry wires and pacing at the nursing station trying to go home overnight.  He appears calm at this morning.  Safety sitter at bedside.  He is oriented x4 but limited insight into why he is in the hospital.  He denies shortness of breath and chest pain.  He says he was in the hospital because of pain in LUQ area that has resolved.  Objective: Vitals:   06/12/21 2337 06/13/21 0621 06/13/21 0649 06/13/21 1119  BP: (!) 149/85 (!) 145/97  109/65  Pulse: 72 80  64  Resp: 16 18  16   Temp: (!) 97.1 F (36.2 C) 97.9 F (36.6 C)  97.7 F (36.5 C)  TempSrc: Oral     SpO2: 97% 98%  95%  Weight:   77.5 kg   Height:         Examination:  GENERAL: No apparent distress.  Nontoxic. HEENT: MMM.  Vision and hearing grossly intact.  NECK: Supple.  No apparent JVD.  RESP:  No IWOB.  Fair aeration bilaterally.  Bibasilar crackles. CVS:  RRR. Heart sounds normal.  ABD/GI/GU: BS+. Abd soft, NTND.  MSK/EXT:  Moves extremities. No apparent deformity.  RLE> LLE (chronic per patient). SKIN: no apparent skin lesion or wound NEURO: Awake, alert and oriented x4 except date.  Limited insight.  No apparent focal neuro deficit. PSYCH: Calm. Normal affect.   Procedures:  None  Microbiology summarized: COVID-19 PCR negative.  Assessment and Plan: * Acute on chronic systolic CHF (congestive heart failure) (HCC) TTE as above.  BNP > 4500 (from 820 in 12/2020).  CXR with mild cardiomegaly.  He denies respiratory distress this morning but limited insight into why he came to the hospital.  He has trace BLE edema, right> left.  Also bibasilar crackles on exam.  No apparent JVD but sitting upright.  On p.o. Lasix 20 mg daily at home.  About 600 cc UOP overnight.  Renal function improved. -Continue IV Lasix 20 mg twice daily -GDMT-continue home Toprol-XL.  Defer other GDMT to cardiology -Monitor intake and output, renal functions and electrolytes -Sodium and fluid restriction -PT/OT evaluation  Delirium Acute metabolic encephalopathy ruled out.  CT head without acute finding.  No focal neuro symptoms.  He is oriented x4 but limited insight.  No focal neurodeficits.  Low suspicion for infection. -Reorientation and delirium precautions -Avoid or minimize sedating medications  Acute renal failure superimposed on stage 3a chronic kidney disease ([place]) Recent Labs    01/04/21 1540 01/06/21 1542 01/07/21 0010 06/12/21 0433 06/13/21 0556  BUN 23 19 18  36* 32*  CREATININE 1.67* 1.40* 1.21 1.99* 1.52*  Renal function improving with diuretics.  Could have cardiorenal renal syndrome -Continue monitoring  CAD (coronary artery  disease) Denies chest pain.  Mild elevated troponin likely demand ischemia.  No acute ischemic finding on EKG.  No RWMA on echocardiogram.  -Continue Toprol-XL -Changed Zocor to Lipitor due to risk of rhabdo with amiodarone -Continue home Eliquis and aspirin.    Elevated troponin Likely demand ischemia.  TTE as above. -Cardiology on board  Essential hypertension Normotensive for most part. -Discontinue as needed hydralazine given history of CAD  Ventricular tachycardia He has BiV ICD -Continue home amiodarone and Toprol-XL.  HLD (hyperlipidemia) - Zocor  [place] CT and RUQ [place] raises concern for calculus cholecystitis. Cholecystitis ruled out.  No clinical signs.  General surgery signed off  Thrombocytopenia (HCC) Seems chronic.  Stable. -Monitor  PAF (paroxysmal atrial fibrillation) (HCC) Continue home amiodarone, Toprol-XL and Eliquis Optimize electrolytes  Physical deconditioning PT/OT eval.    DVT prophylaxis:  apixaban (ELIQUIS) tablet 2.5 mg Start: 06/12/21 1600 apixaban (ELIQUIS) tablet 2.5 mg  Code Status: DNR/DNI Family Communication: Patient and/or RN. Available if any question.  Level of care: Telemetry Cardiac Status is: Inpatient Remains inpatient appropriate because: Acute on chronic combined CHF, AKI and delirium   Final disposition: Likely home in the next 24 to 48 hours Consultants:  Cardiology  Sch Meds:  Scheduled Meds:  amiodarone  200 mg Oral Daily   apixaban  2.5 mg Oral BID   aspirin EC  81 mg Oral Daily   atorvastatin  20 mg Oral QHS   furosemide  20 mg Intravenous BID   [START ON 06/14/2021] metoprolol succinate  50 mg Oral Daily   Continuous Infusions: PRN Meds:.acetaminophen, albuterol, dextromethorphan-guaiFENesin, diphenhydrAMINE  Antimicrobials: Anti-infectives (From admission, onward)    None        I have personally reviewed the following labs and images: CBC: Recent Labs  Lab 06/12/21 0433  WBC 6.0   NEUTROABS 4.1  HGB 14.6  HCT 46.3  MCV 94.5  PLT 139*   BMP &GFR Recent Labs  Lab 06/12/21 0433 06/13/21 0556  NA 140 137  K 4.7 3.5  CL 108 106  CO2 22 26  GLUCOSE 171* 84  BUN 36* 32*  CREATININE 1.99* 1.52*  CALCIUM 9.3 8.8*  MG  --  2.5*   Estimated Creatinine Clearance: 34.7 mL/min (A) (by C-G formula based on SCr of 1.52 mg/dL (H)). Liver & Pancreas: Recent Labs  Lab 06/12/21 0433  AST 34  ALT 25  ALKPHOS 70  BILITOT 1.7*  PROT 6.2*  ALBUMIN 3.4*   Recent Labs  Lab 06/12/21 0433  LIPASE 27   No results for input(s): AMMONIA in the last 168 hours. Diabetic: Recent Labs    06/12/21 1605  HGBA1C 5.6   No results for input(s): GLUCAP in the last 168 hours. Cardiac Enzymes: No results for input(s): CKTOTAL, CKMB, CKMBINDEX, TROPONINI in the last 168 hours. No results for input(s): PROBNP in the last 8760 hours. Coagulation Profile: Recent Labs  Lab 06/12/21 0433  INR 1.1   Thyroid Function Tests: No  results for input(s): TSH, T4TOTAL, FREET4, T3FREE, THYROIDAB in the last 72 hours. Lipid Profile: Recent Labs    06/12/21 0617  CHOL 149  HDL 46  LDLCALC 98  TRIG 27  CHOLHDL 3.2   Anemia Panel: No results for input(s): VITAMINB12, FOLATE, FERRITIN, TIBC, IRON, RETICCTPCT in the last 72 hours. Urine analysis:    Component Value Date/Time   COLORURINE YELLOW (A) 06/12/2021 0446   APPEARANCEUR CLEAR (A) 06/12/2021 0446   APPEARANCEUR Clear 02/01/2018 1111   LABSPEC 1.010 06/12/2021 0446   LABSPEC 1.009 04/09/2013 1315   PHURINE 5.0 06/12/2021 0446   GLUCOSEU NEGATIVE 06/12/2021 0446   GLUCOSEU Negative 04/09/2013 1315   HGBUR NEGATIVE 06/12/2021 0446   BILIRUBINUR NEGATIVE 06/12/2021 0446   BILIRUBINUR Negative 02/01/2018 1111   BILIRUBINUR Negative 04/09/2013 [place] 06/12/2021 0446   PROTEINUR NEGATIVE 06/12/2021 0446   NITRITE NEGATIVE 06/12/2021 0446   LEUKOCYTESUR NEGATIVE 06/12/2021 0446   LEUKOCYTESUR  Negative 04/09/2013 1315   Sepsis Labs: Invalid input(s): PROCALCITONIN, [place]  Microbiology: Recent Results (from the past 240 hour(s))  SARS Coronavirus 2 by RT PCR (hospital order, performed in [facility] hospital lab) *cepheid single result test* Anterior Nasal Swab     Status: None   Collection Time: 06/12/21  5:17 AM   Specimen: Anterior Nasal Swab  Result Value Ref Range Status   SARS Coronavirus 2 by RT PCR NEGATIVE NEGATIVE Final    Comment: (NOTE) SARS-CoV-2 target nucleic acids are NOT DETECTED.  The SARS-CoV-2 RNA is generally detectable in upper and lower respiratory specimens during the acute phase of infection. The lowest concentration of SARS-CoV-2 viral copies this assay can detect is 250 copies / mL. A negative result does not preclude SARS-CoV-2 infection and should not be used as the sole basis for treatment or other patient management decisions.  A negative result may occur with improper specimen collection / handling, submission of specimen other than nasopharyngeal swab, presence of viral mutation(s) within the areas targeted by this assay, and inadequate number of viral copies (<250 copies / mL). A negative result must be combined with clinical observations, patient history, and epidemiological information.  Fact Sheet for Patients:   https://www.patel.info/  Fact Sheet for Healthcare Providers: https://hall.com/  This test is not yet approved or  cleared by the [place] FDA and has been authorized for detection and/or diagnosis of SARS-CoV-2 by FDA under an Emergency Use Authorization (EUA).  This EUA will remain in effect (meaning this test can be used) for the duration of the COVID-19 declaration under Section 564(b)(1) of the Act, 21 U.S.C. section 360bbb-3(b)(1), unless the authorization is terminated or revoked sooner.  Performed at [facility], [address], [place], [place]  [address]     Radiology Studies: ECHOCARDIOGRAM COMPLETE  Result Date: 06/13/2021    ECHOCARDIOGRAM REPORT   Patient Name:   KIPPER WERTHEIMER Date of Exam: 06/13/2021 Medical Rec #:  [id number]    Height:       70.0 in Accession #:    [id number]   Weight:       170.8 lb Date of Birth:  1930-12-20     BSA:          1.952 m Patient Age:    72 years     BP:           145/97 mmHg Patient Gender: M            HR:  74 bpm. Exam Location:  [facility] Procedure: 2D Echo, Cardiac Doppler and Color Doppler Indications:     [id number] CHF acute systolic  History:         Patient has no prior history of Echocardiogram examinations.                  Cardiomegaly and CHF, CAD, Defibrillator, Arrythmias:Atrial                  Fibrillation; Risk Factors:Hypertension and Dyslipidemia.  Sonographer:     Rosalia Hammers Referring Phys:  [id number] Soledad Gerlach NIU Diagnosing Phys: Isaias Cowman MD IMPRESSIONS  1. Left ventricular ejection fraction, by estimation, is 20 to 25%. The left ventricle has severely decreased function. The left ventricle has no regional wall motion abnormalities. The left ventricular internal cavity size was moderately to severely dilated. Left ventricular diastolic parameters are consistent with Grade I diastolic dysfunction (impaired relaxation).  2. Right ventricular systolic function is normal. The right ventricular size is normal.  3. The mitral valve is normal in structure. Moderate to severe mitral valve regurgitation. No evidence of mitral stenosis.  4. Tricuspid valve regurgitation is mild to moderate.  5. The aortic valve is normal in structure. Aortic valve regurgitation is mild to moderate. Mild aortic valve stenosis.  6. The inferior vena cava is normal in size with greater than 50% respiratory variability, suggesting right atrial pressure of 3 mmHg. FINDINGS  Left Ventricle: Left ventricular ejection fraction, by estimation, is 20 to 25%. The left ventricle has severely decreased function. The left ventricle  has no regional wall motion abnormalities. The left ventricular internal cavity size was moderately to severely dilated. There is no left ventricular hypertrophy. Left ventricular diastolic parameters are consistent with Grade I diastolic dysfunction (impaired relaxation). Right Ventricle: The right ventricular size is normal. No increase in right ventricular wall thickness. Right ventricular systolic function is normal. Left Atrium: Left atrial size was normal in size. Right Atrium: Right atrial size was normal in size. Pericardium: There is no evidence of pericardial effusion. Mitral Valve: The mitral valve is normal in structure. Moderate to severe mitral valve regurgitation. No evidence of mitral valve stenosis. MV peak gradient, 4.6 mmHg. The mean mitral valve gradient is 2.0 mmHg. Tricuspid Valve: The tricuspid valve is normal in structure. Tricuspid valve regurgitation is mild to moderate. No evidence of tricuspid stenosis. Aortic Valve: The aortic valve is normal in structure. Aortic valve regurgitation is mild to moderate. Aortic regurgitation PHT measures 528 msec. Mild aortic stenosis is present. Aortic valve mean gradient measures 6.0 mmHg. Aortic valve peak gradient measures 9.9 mmHg. Aortic valve area, by VTI measures 0.86 cm. Pulmonic Valve: The pulmonic valve was normal in structure. Pulmonic valve regurgitation is not visualized. No evidence of pulmonic stenosis. Aorta: The aortic root is normal in size and structure. Venous: The inferior vena cava is normal in size with greater than 50% respiratory variability, suggesting right atrial pressure of 3 mmHg. IAS/Shunts: No atrial level shunt detected by color flow Doppler.  LEFT VENTRICLE PLAX 2D LVIDd:         5.69 cm LVIDs:         5.21 cm LV PW:         1.27 cm LV IVS:        1.16 cm LVOT diam:     1.70 cm LV SV:         29 LV SV Index:   15 LVOT Area:     2.27  cm  RIGHT VENTRICLE RV Basal diam:  3.81 cm LEFT ATRIUM            Index        RIGHT  ATRIUM           Index LA diam:      4.80 cm  2.46 cm/m   RA Area:     11.10 cm LA Vol (A2C): 104.0 ml 53.28 ml/m  RA Volume:   27.80 ml  14.24 ml/m LA Vol (A4C): 49.5 ml  25.36 ml/m  AORTIC VALVE                     PULMONIC VALVE AV Area (Vmax):    0.69 cm      PV Vmax:          0.86 m/s AV Area (Vmean):   0.65 cm      PV Vmean:         61.300 cm/s AV Area (VTI):     0.86 cm      PV VTI:           0.183 m AV Vmax:           157.00 cm/s   PV Peak grad:     3.0 mmHg AV Vmean:          112.000 cm/s  PV Mean grad:     2.0 mmHg AV VTI:            0.336 m       PR End Diast Vel: 9.61 msec AV Peak Grad:      9.9 mmHg AV Mean Grad:      6.0 mmHg LVOT Vmax:         47.50 cm/s LVOT Vmean:        32.300 cm/s LVOT VTI:          0.127 m LVOT/AV VTI ratio: 0.38 AI PHT:            528 msec  AORTA Ao Root diam: 3.50 cm MITRAL VALVE MV Area (PHT): 4.54 cm     SHUNTS MV Area VTI:   0.98 cm     Systemic VTI:  0.13 m MV Peak grad:  4.6 mmHg     Systemic Diam: 1.70 cm MV Mean grad:  2.0 mmHg MV Vmax:       1.07 m/s MV Vmean:      74.7 cm/s MV Decel Time: 167 msec MV E velocity: 60.40 cm/s MV A velocity: 107.00 cm/s MV E/A ratio:  0.56 Isaias Cowman MD Electronically signed by Isaias Cowman MD Signature Date/Time: 06/13/2021/1:59:56 PM    Final       Analie Katzman T. [place]  If 7PM-7AM, please contact night-coverage [email or web address] 06/13/2021, 4:04 PM

## 2021-06-13 NOTE — Hospital Course (Addendum)
88 year old M with PMH of combined CHF, VT s/p BiV ICD, CKD-3A, CAD, BPH, HTN, HLD and thrombocytopenia presenting with increased shortness of breath and palpitation, and admitted for acute on chronic combined CHF and AKI.  BNP > 4500 (821 in 12/2020).  Slightly elevated troponin without significant delta.  EKG sinus rhythm with RBBB but no acute ischemic finding.  CT abdomen and pelvis on RUQ [place] raises concern for calculus cholecystitis although patient had no GI symptoms, fever, leukocytosis or RUQ tenderness to suggest cholecystitis.  Patient was started on IV Lasix.  Cardiology and general surgery consulted.  Acute cholecystitis ruled out.  On IV Lasix for acute CHF.  TTE with LVEF of 20 to 25%, G1 DD, moderate to severe MVR, mild to moderate TVR but no RWMA, G1 DD.  Remains on IV Lasix.  Cardiology following.  Could be discharged if cleared by cardiology and therapy.

## 2021-06-13 NOTE — Evaluation (Signed)
Physical Therapy Evaluation Patient Details Name: Spencer Coleman R Schriner MRN: [id number] DOB: 09/06/1930 Today's Date: 06/13/2021  History of Present Illness  Pt is a 88 year old male with PMH including combined CHF, VT s/p BiV ICD, CKD-3A, CAD, BPH, HTN, HLD and thrombocytopenia presenting with increased shortness of breath and palpitation, and admitted for acute on chronic combined CHF and AKI.  Clinical Impression  Mr. Loreta AveWagner is pleasant and motivated pt who presents with pain in the plantar aspect of R foot, generalized weakness ankles> Hips>Knees, balance deficits, gait deviations and decreased aerobic capacity. Pt ambulated 100 ft with Mod assist and multiple LOB corrected by PT. Pt has a sitter in his room to promote safety. Pt has history of multiple falls and tends to be severely impulsive. Pt's Tinetti score 9/28. Pt is at high risk of fall. Pt lives alone and has no assistance at home as per Nurse and his sister. Pt will benefit SNF which pt is also agreeing to promote safety and reduce fall risk with functional mobility in order to return home alone safely.      Recommendations for follow up therapy are one component of a multi-disciplinary discharge planning process, led by the attending physician.  Recommendations may be updated based on patient status, additional functional criteria and insurance authorization.  Follow Up Recommendations Skilled nursing-short term rehab (<3 hours/day)    Assistance Recommended at Discharge Frequent or constant Supervision/Assistance  Patient can return home with the following  A lot of help with walking and/or transfers;A lot of help with bathing/dressing/bathroom;Assistance with cooking/housework;Direct supervision/assist for medications management;Direct supervision/assist for financial management;Assist for transportation    Equipment Recommendations Rolling walker (2 wheels);BSC/3in1 (shower seat)  Recommendations for Other Services       Functional  Status Assessment Patient has had a recent decline in their functional status and demonstrates the ability to make significant improvements in function in a reasonable and predictable amount of time.     Precautions / Restrictions Precautions Precautions: Fall Restrictions Weight Bearing Restrictions: No      Mobility  Bed Mobility Overal bed mobility: Modified Independent                  Transfers Overall transfer level: Needs assistance Equipment used: Rolling walker (2 wheels) Transfers: Sit to/from Stand Sit to Stand: Min assist (to maintain balance)                Ambulation/Gait Ambulation/Gait assistance: Mod assist (for balance) Gait Distance (Feet): 100 Feet Assistive device: Rolling walker (2 wheels) Gait Pattern/deviations: Decreased step length - right, Decreased dorsiflexion - right, Knee flexed in stance - right, Antalgic (uneven step lenght) Gait velocity: impulsive        Stairs            Wheelchair Mobility    Modified Rankin (Stroke Patients Only)       Balance Overall balance assessment: Needs assistance, History of Falls Sitting-balance support: No upper extremity supported Sitting balance-Leahy Scale: Normal     Standing balance support: Bilateral upper extremity supported Standing balance-Leahy Scale: Poor   Single Leg Stance - Right Leg: 1 Single Leg Stance - Left Leg: 3 Tandem Stance - Right Leg: 0 Tandem Stance - Left Leg: 0 Rhomberg - Eyes Opened: 10 [place] - Eyes Closed: 6     Standardized Balance Assessment Standardized Balance Assessment :  (Tinetti: 9/28)           Pertinent Vitals/Pain Pain Assessment Pain Assessment: 0-10 Pain Score: 5  Pain Location: R plantar foot    Home Living Family/patient expects to be discharged to:: Private residence Living Arrangements: Alone Available Help at Discharge:  (as per nurse and pt no help available) Type of Home: House Home Access: Stairs to  enter Entrance Stairs-Rails: Can reach both Entrance Stairs-Number of Steps: 1 (5 inside home)   Home Layout: One level Home Equipment: Grab bars - toilet;Rollator (4 wheels)      Prior Function Prior Level of Function : Independent/Modified Independent             Mobility Comments: Uses 4WW ADLs Comments: Independent in ADLs and IADLs, though pt's family reports concerns about his ability to complete these     Hand Dominance   Dominant Hand: Right    Extremity/Trunk Assessment   Upper Extremity Assessment Upper Extremity Assessment: Defer to OT evaluation    Lower Extremity Assessment Lower Extremity Assessment: Generalized weakness       Communication   Communication: No difficulties  Cognition Arousal/Alertness: Awake/alert Behavior During Therapy: WFL for tasks assessed/performed   Area of Impairment: Orientation, Memory, Following commands, Safety/judgement, Problem solving                 Orientation Level: Disoriented to, Situation   Memory: Decreased recall of precautions, Decreased short-term memory Following Commands: Follows one step commands consistently, Follows multi-step commands inconsistently Safety/Judgement: Decreased awareness of safety, Decreased awareness of deficits   Problem Solving: Difficulty sequencing, Requires verbal cues General Comments: Pleasant but disoriented, short term memory deficits noted        General Comments General comments (skin integrity, edema, etc.): bruises and bleeds    Exercises Other Exercises Other Exercises: provided education to pt and sitter about safety and fall risk.   Assessment/Plan    PT Assessment Patient needs continued PT services  PT Problem List Decreased strength;Decreased range of motion;Decreased activity tolerance;Decreased balance;Decreased mobility;Decreased cognition;Decreased safety awareness;Decreased knowledge of precautions;Cardiopulmonary status limiting  activity;Pain;Decreased skin integrity       PT Treatment Interventions Gait training;Stair training;Functional mobility training;Therapeutic activities;Therapeutic exercise;Balance training;Neuromuscular re-education;Cognitive remediation;Patient/family education    PT Goals (Current goals can be found in the Care Plan section)  Acute Rehab PT Goals Patient Stated Goal: I want to get better. I want to go to SNF to become safe and better. PT Goal Formulation: With patient Time For Goal Achievement: 06/27/21 Potential to Achieve Goals: Good    Frequency Min 2X/week     Co-evaluation               AM-PAC PT "6 Clicks" Mobility  Outcome Measure Help needed turning from your back to your side while in a flat bed without using bedrails?: None Help needed moving from lying on your back to sitting on the side of a flat bed without using bedrails?: None Help needed moving to and from a bed to a chair (including a wheelchair)?: A Lot Help needed standing up from a chair using your arms (e.g., wheelchair or bedside chair)?: A Lot Help needed to walk in hospital room?: Total Help needed climbing 3-5 steps with a railing? : Total 6 Click Score: 14    End of Session Equipment Utilized During Treatment: Gait belt Activity Tolerance: Patient tolerated treatment well;Patient limited by pain (pt is impulsive) Patient left: in bed;with nursing/sitter in room Nurse Communication: Mobility status (safety and pain status.) PT Visit Diagnosis: Unsteadiness on feet (R26.81);Other abnormalities of gait and mobility (R26.89);Repeated falls (R29.6);Muscle weakness (generalized) (M62.81);History of falling (Z91.81);Pain Pain -  Right/Left: Right Pain - part of body: Ankle and joints of foot    Time: 5009-3818 PT Time Calculation (min) (ACUTE ONLY): 45 min   Charges:   PT Evaluation $PT Eval Low Complexity: 1 Low PT Treatments $Gait Training: 8-22 mins $Therapeutic Exercise: 8-22  mins $Neuromuscular Re-education: 8-22 mins       Sidi Dzikowski PT DPT 5:31 PM,06/13/21   Anes Rigel H Alyss Granato 06/13/2021, 5:25 PM

## 2021-06-13 NOTE — Progress Notes (Signed)
[facility] Cardiology  SUBJECTIVE: Patient denies chest pain, shortness of breath, palpitations, presyncope, and wishes to go home   Vitals:   06/12/21 1859 06/12/21 2337 06/13/21 0621 06/13/21 0649  BP: (!) 156/94 (!) 149/85 (!) 145/97   Pulse: 73 72 80   Resp: 14 16 18    Temp: 97.6 F (36.4 C) (!) 97.1 F (36.2 C) 97.9 F (36.6 C)   TempSrc:  Oral    SpO2: 100% 97% 98%   Weight:    77.5 kg  Height:         Intake/Output Summary (Last 24 hours) at 06/13/2021 1102 Last data filed at 06/13/2021 1000 Gross per 24 hour  Intake 360 ml  Output 600 ml  Net -240 ml      PHYSICAL EXAM  General: Well developed, well nourished, in no acute distress HEENT:  Normocephalic and atramatic Neck:  No JVD.  Lungs: Clear bilaterally to auscultation and percussion. Heart: HRRR . Normal S1 and S2 without gallops or murmurs.  Abdomen: Bowel sounds are positive, abdomen soft and non-tender  Msk:  Back normal, normal gait. Normal strength and tone for age. Extremities: No clubbing, cyanosis or edema.   Neuro: Alert and oriented X 3. Psych:  Good affect, responds appropriately   LABS: Basic Metabolic Panel: Recent Labs    06/12/21 0433 06/13/21 0556  NA 140 137  K 4.7 3.5  CL 108 106  CO2 22 26  GLUCOSE 171* 84  BUN 36* 32*  CREATININE 1.99* 1.52*  CALCIUM 9.3 8.8*  MG  --  2.5*   Liver Function Tests: Recent Labs    06/12/21 0433  AST 34  ALT 25  ALKPHOS 70  BILITOT 1.7*  PROT 6.2*  ALBUMIN 3.4*   Recent Labs    06/12/21 0433  LIPASE 27   CBC: Recent Labs    06/12/21 0433  WBC 6.0  NEUTROABS 4.1  HGB 14.6  HCT 46.3  MCV 94.5  PLT 139*   Cardiac Enzymes: No results for input(s): CKTOTAL, CKMB, CKMBINDEX, TROPONINI in the last 72 hours. BNP: Invalid input(s): POCBNP D-Dimer: No results for input(s): DDIMER in the last 72 hours. Hemoglobin A1C: Recent Labs    06/12/21 1605  HGBA1C 5.6   Fasting Lipid Panel: Recent Labs    06/12/21 0617  CHOL 149  HDL  46  LDLCALC 98  TRIG 27  CHOLHDL 3.2   Thyroid Function Tests: No results for input(s): TSH, T4TOTAL, T3FREE, THYROIDAB in the last 72 hours.  Invalid input(s): FREET3 Anemia Panel: No results for input(s): VITAMINB12, FOLATE, FERRITIN, TIBC, IRON, RETICCTPCT in the last 72 hours.  CT HEAD WO CONTRAST (5MM)  Result Date: 06/12/2021 CLINICAL DATA:  Mental status change. EXAM: CT HEAD WITHOUT CONTRAST TECHNIQUE: Contiguous axial images were obtained from the base of the skull through the vertex without intravenous contrast. RADIATION DOSE REDUCTION: This exam was performed according to the departmental dose-optimization program which includes automated exposure control, adjustment of the mA and/or kV according to patient size and/or use of iterative reconstruction technique. COMPARISON:  Jun 04, 2016 FINDINGS: Brain: No subdural, epidural, or subarachnoid hemorrhage. Cerebellum, brainstem, and basal cisterns are unremarkable. Ventricles and sulci are stable. No acute cortical ischemia or infarct identified. No mass effect or midline shift. Vascular: Calcified atherosclerotic changes in the intracranial carotid arteries. Skull: Normal. Negative for fracture or focal lesion. Sinuses/Orbits: No acute finding. Other: None. IMPRESSION: No acute intracranial abnormalities are identified. Electronically Signed   By: Dorise Bullion III M.D.  On: 06/12/2021 13:14   CT Chest Wo Contrast  Result Date: 06/12/2021 CLINICAL DATA:  88 year old male with history of respiratory illness. Abnormal chest x-ray. EXAM: CT CHEST WITHOUT CONTRAST TECHNIQUE: Multidetector CT imaging of the chest was performed following the standard protocol without IV contrast. RADIATION DOSE REDUCTION: This exam was performed according to the departmental dose-optimization program which includes automated exposure control, adjustment of the mA and/or kV according to patient size and/or use of iterative reconstruction technique. COMPARISON:   Chest CT 06/07/2007. FINDINGS: Cardiovascular: Heart size is mildly enlarged. There is no significant pericardial fluid, thickening or pericardial calcification. There is aortic atherosclerosis, as well as atherosclerosis of the great vessels of the mediastinum and the coronary arteries, including calcified atherosclerotic plaque in the left main, left anterior descending, left circumflex and right coronary arteries. Left-sided biventricular pacemaker/AICD with lead tips terminating in the right atrium, right ventricular apex and overlying the lateral wall the left ventricle via the coronary sinus and coronary veins. Calcifications of the aortic valve. Mediastinum/Nodes: No pathologically enlarged mediastinal or hilar lymph nodes. Esophagus is unremarkable in appearance. No axillary lymphadenopathy. Lungs/Pleura: Areas of scarring and/or atelectasis in the right lung base. No acute consolidative airspace disease. No pleural effusions. No suspicious appearing pulmonary nodules or masses are noted. Upper Abdomen: Severe elevation of the right hemidiaphragm again noted. Calcified gallstones in the neck of the gallbladder. Gallbladder appears nearly decompressed, however, gallbladder wall appears thickened and edematous with trace volume of pericholecystic fluid, concerning for probable acute cholecystitis. Incompletely imaged well-circumscribed exophytic lesion in the upper pole of the left kidney is low-attenuation (statistically likely a cyst, no imaging follow-up recommended) measuring at least 5.3 cm in diameter. Extensive atherosclerosis of the abdominal aorta and visualized mesenteric vasculature. Musculoskeletal: There are no aggressive appearing lytic or blastic lesions noted in the visualized portions of the skeleton. IMPRESSION: 1. Cholelithiasis with evidence of cholecystitis. Further clinical evaluation is recommended. This could be better evaluated with right upper quadrant abdominal ultrasound if  clinically appropriate. 2. Severe chronic elevation of the right hemidiaphragm redemonstrated. No acute findings in the thorax otherwise noted to account for the patient's symptoms. 3. Mild cardiomegaly. 4. Aortic atherosclerosis, in addition to left main and three-vessel coronary artery disease. 5. There are calcifications of the aortic valve. Echocardiographic correlation for evaluation of potential valvular dysfunction may be warranted if clinically indicated. Aortic Atherosclerosis (ICD10-I70.0). Electronically Signed   By: Vinnie Langton M.D.   On: 06/12/2021 05:40   DG Chest Port 1 View  Result Date: 06/12/2021 CLINICAL DATA:  88 year old male with history of acute dyspnea and hypoxemia. Difficulty breathing for the past 3 days. EXAM: PORTABLE CHEST 1 VIEW COMPARISON:  Chest x-ray 01/06/2021. FINDINGS: Worsening severe chronic elevation of the right hemidiaphragm. Opacity at the right base which likely reflects passive atelectasis. Left lung appears clear. No pleural effusions. No pneumothorax. No evidence of pulmonary edema. Heart size appears mildly enlarged. Upper mediastinal contours are within normal limits allowing for patient positioning. Atherosclerotic calcifications in the thoracic aorta. Left-sided biventricular pacemaker/AICD with lead tips projecting over the expected location of the right atrium, right ventricle and left ventricle via the coronary sinus and coronary veins. IMPRESSION: 1. Severe worsening chronic elevation of the right hemidiaphragm with probable passive subsegmental atelectasis in the right lung base. 2. Mild cardiomegaly. 3. Aortic atherosclerosis. Electronically Signed   By: Vinnie Langton M.D.   On: 06/12/2021 05:02   US ABDOMEN LIMITED RUQ (LIVER/GB)  Result Date: 06/12/2021 CLINICAL DATA:  88 year old male  with gallstones and evidence of gallbladder inflammation on noncontrast chest CT this morning. EXAM: ULTRASOUND ABDOMEN LIMITED RIGHT UPPER QUADRANT COMPARISON:   Chest CT 0525 hours today. FINDINGS: Suboptimal exam due to elevated right hemidiaphragm, acoustic shadowing of the liver by the lung. Gallbladder: Thickened gallbladder wall up to 5 mm. No pericholecystic fluid. Gallstones in the neck of the gallbladder remote better demonstrated by CT this morning. Reportedly no sonographic Murphy sign was elicited. Common bile duct: Diameter: 3 mm, normal. Liver: No focal lesion identified. Within normal limits in parenchymal echogenicity. Portal vein is patent on color Doppler imaging with normal direction of blood flow towards the liver. Other: No free fluid identified. IMPRESSION: 1. Confirmed abnormal gallbladder wall thickening. But limited ultrasound visualization of the gallbladder due to elevated right hemidiaphragm. Gallstones in the gallbladder neck much better demonstrated by CT today. Despite absent sonographic Murphy sign the constellation is Highly Suspicious For Acute Cholecystitis. 2. No evidence of bile duct obstruction. Electronically Signed   By: Genevie Ann M.D.   On: 06/12/2021 07:09     Echo pending  TELEMETRY: Atrial and ventricular pacing:  ASSESSMENT AND PLAN:  Principal Problem:   Acute on chronic systolic CHF (congestive heart failure) (HCC) Active Problems:   Essential hypertension   HLD (hyperlipidemia)   Ventricular tachycardia   Acute renal failure superimposed on stage 3a chronic kidney disease (HCC)   Gallstone   Elevated troponin   Thrombocytopenia (HCC)   CAD (coronary artery disease)   Acute metabolic encephalopathy   PAF (paroxysmal atrial fibrillation) ([place])    1. Acute on chronic systolic congestive heart failure, HFrEF LVEF 40%, does not appear to be fluid overloaded, chest x-ray does not reveal pulmonary edema, BNP greater than 4500 2.  Mildly elevated troponin (109, 121, 146), in the absence of chest pain, with previous cardiac catheterization revealing insignificant coronary artery disease 3.  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  No episodes of VT observed on telemetry. 4.  History of ventricular tachycardia, previous shocks 03/09/2016, 12/29/2020, with history of noncompliance with amiodarone 5.  Paroxysmal atrial fibrillation, on low-dose Eliquis for stroke prevention, and amiodarone for rate and rhythm control 6.  Essential hypertension, blood pressure mildly elevated   Recommendations   1.  Continue current medication 2.  Continue Eliquis 2.5 mg twice daily 3.  Uptitrate metoprolol succinate to 50 mg daily 4.  Continue amiodarone 200 mg daily 5.  Review 2D echocardiogram 6.  Stressed the importance with patient about compliance with amiodarone 7.  Follow-up with me 1 to 2 weeks following discharge     Isaias Cowman, MD, PhD, [facility] 06/13/2021 11:02 AM

## 2021-06-13 NOTE — Evaluation (Signed)
Occupational Therapy Evaluation Patient Details Name: Spencer Coleman MRN: [id number] DOB: [birth year] Today's Date: 06/13/2021   History of Present Illness Pt is a 88 year old male with PMH including combined CHF, VT s/p BiV ICD, CKD-3A, CAD, BPH, HTN, HLD and thrombocytopenia presenting with increased shortness of breath and palpitation, and admitted for acute on chronic combined CHF and AKI.   Clinical Impression   Mr. Kloosterman presents with impaired cognition, including poor safety awareness, disorientation, and poor short term memory, that impacts his ability to safely and independently complete functional tasks.  Pt presents as poor historian, but his sister was present to provide collateral information.  He currently lives alone and is able to complete basic ADLs independently at baseline.  Currently, he requires supervision assist and moderate verbal cues for safety awareness and sequencing in basic ADLs.  He is disoriented to all but himself and has poor awareness of his deficits.  He will continue to benefit from skilled OT services in acute setting to support functional cognition, safety, and independence in ADLs.  Recommend SNF upon discharge if pt does not have 24-hour supervision available from friends/family.        Recommendations for follow up therapy are one component of a multi-disciplinary discharge planning process, led by the attending physician.  Recommendations may be updated based on patient status, additional functional criteria and insurance authorization.   Follow Up Recommendations  Skilled nursing-short term rehab (<3 hours/day)    Assistance Recommended at Discharge Frequent or constant Supervision/Assistance  Patient can return home with the following A little help with bathing/dressing/bathroom;Direct supervision/assist for medications management;Assistance with cooking/housework;Direct supervision/assist for financial management;Assist for transportation;Help with stairs  or ramp for entrance    Functional Status Assessment  Patient has had a recent decline in their functional status and demonstrates the ability to make significant improvements in function in a reasonable and predictable amount of time.  Equipment Recommendations  Tub/shower bench    Recommendations for Other Services       Precautions / Restrictions Precautions Precautions: Fall Restrictions Weight Bearing Restrictions: No      Mobility Bed Mobility Overal bed mobility: Modified Independent               Patient Response: Cooperative  Transfers Overall transfer level: Needs assistance Equipment used: Rolling walker (2 wheels) Transfers: Sit to/from Stand Sit to Stand: Supervision                  Balance Overall balance assessment: Needs assistance                                         ADL either performed or assessed with clinical judgement   ADL Overall ADL's : Needs assistance/impaired                                       General ADL Comments: Pt is grossly able to perform ADLs with standby assist, though requires min verbal cues for safety awareness and sequencing.     Vision Patient Visual Report: No change from baseline       Perception     Praxis      Pertinent Vitals/Pain Pain Assessment Pain Assessment: No/denies pain     Hand Dominance Right   Extremity/Trunk Assessment Upper Extremity Assessment Upper  Extremity Assessment: Overall WFL for tasks assessed   Lower Extremity Assessment Lower Extremity Assessment: Defer to PT evaluation       Communication Communication Communication: No difficulties   Cognition Arousal/Alertness: Awake/alert Behavior During Therapy: WFL for tasks assessed/performed Overall Cognitive Status: Impaired/Different from baseline Area of Impairment: Orientation, Memory, Following commands, Safety/judgement, Problem solving                 Orientation  Level: Disoriented to, Time, Situation, Place   Memory: Decreased recall of precautions, Decreased short-term memory Following Commands: Follows one step commands consistently Safety/Judgement: Decreased awareness of safety, Decreased awareness of deficits   Problem Solving: Difficulty sequencing, Requires verbal cues General Comments: Pleasant but disoriented, short term memory deficits noted     General Comments       Exercises Other Exercises Other Exercises: provided education to pt and family re: OT role and plan of care, fall and safety precautions, discharge recommendations   Shoulder Instructions      Home Living Family/patient expects to be discharged to:: Private residence Living Arrangements: Alone Available Help at Discharge: Family;Available PRN/intermittently (pt has a sister who is available PRN but pt has other siblings who are also currently requiring care) Type of Home: House Home Access: Stairs to enter [employer] of Steps: 5   Home Layout: One level     Bathroom Shower/Tub: Producer, television/film/video: Handicapped height     Home Equipment: Grab bars - toilet;Rollator (4 wheels)          Prior Functioning/Environment Prior Level of Function : Independent/Modified Independent             Mobility Comments: Uses 4WW ADLs Comments: Independent in ADLs and IADLs, though pt's family reports concerns about his ability to complete these        OT Problem List: Decreased strength;Impaired balance (sitting and/or standing);Decreased cognition;Decreased safety awareness;Decreased knowledge of use of DME or AE      OT Treatment/Interventions: Self-care/ADL training;Therapeutic exercise;DME and/or AE instruction;Energy conservation;Therapeutic activities;Cognitive remediation/compensation;Patient/family education;Balance training    OT Goals(Current goals can be found in the care plan section) Acute Rehab OT Goals Patient Stated  Goal: to return home OT Goal Formulation: With patient/family Time For Goal Achievement: 06/27/21 Potential to Achieve Goals: Good  OT Frequency: Min 2X/week    Co-evaluation              AM-PAC OT "6 Clicks" Daily Activity     Outcome Measure Help from another person eating meals?: None Help from another person taking care of personal grooming?: A Little Help from another person toileting, which includes using toliet, bedpan, or urinal?: A Little Help from another person bathing (including washing, rinsing, drying)?: A Little Help from another person to put on and taking off regular upper body clothing?: A Little Help from another person to put on and taking off regular lower body clothing?: A Little 6 Click Score: 19   End of Session    Activity Tolerance: Patient tolerated treatment well Patient left: in bed;with call bell/phone within reach;with family/visitor present;with nursing/sitter in room  OT Visit Diagnosis: Other symptoms and signs involving cognitive function;Repeated falls (R29.6)                Time: 7782-4235 OT Time Calculation (min): 23 min Charges:  OT General Charges $OT Visit: 1 Visit OT Evaluation $OT Eval Moderate Complexity: 1 Mod  Latasha Puskas, OTR/L 06/13/21, 4:26 PM

## 2021-06-13 NOTE — Progress Notes (Signed)
At this moment, pt refusing to wear cardiac monitor. Pt still wanting to go home.

## 2021-06-14 LAB — RENAL FUNCTION PANEL
Albumin: 3.4 g/dL — ABNORMAL LOW (ref 3.5–5.0)
Anion gap: 6 (ref 5–15)
BUN: 39 mg/dL — ABNORMAL HIGH (ref 8–23)
CO2: 28 mmol/L (ref 22–32)
Calcium: 8.7 mg/dL — ABNORMAL LOW (ref 8.9–10.3)
Chloride: 104 mmol/L (ref 98–111)
Creatinine, Ser: 1.75 mg/dL — ABNORMAL HIGH (ref 0.61–1.24)
GFR, Estimated: 37 mL/min — ABNORMAL LOW (ref >60)
Glucose, Bld: 97 mg/dL (ref 70–99)
Phosphorus: 4 mg/dL (ref 2.5–4.6)
Potassium: 3.9 mmol/L (ref 3.5–5.1)
Sodium: 138 mmol/L (ref 135–145)

## 2021-06-14 LAB — CBC
HCT: 42 % (ref 39.0–52.0)
Hemoglobin: 14.1 g/dL (ref 13.0–17.0)
MCH: 30.7 pg (ref 26.0–34.0)
MCHC: 33.6 g/dL (ref 30.0–36.0)
MCV: 91.3 fL (ref 80.0–100.0)
Platelets: 113 10*3/uL — ABNORMAL LOW (ref 150–400)
RBC: 4.6 MIL/uL (ref 4.22–5.81)
RDW: 13.6 % (ref 11.5–15.5)
WBC: 7.7 10*3/uL (ref 4.0–10.5)
nRBC: 0 % (ref 0.0–0.2)

## 2021-06-14 LAB — BRAIN NATRIURETIC PEPTIDE: B Natriuretic Peptide: 1331.9 pg/mL — ABNORMAL HIGH (ref 0.0–100.0)

## 2021-06-14 LAB — MAGNESIUM: Magnesium: 2.5 mg/dL — ABNORMAL HIGH (ref 1.7–2.4)

## 2021-06-14 LAB — TSH: TSH: 0.928 u[IU]/mL (ref 0.350–4.500)

## 2021-06-14 MED ORDER — APIXABAN 2.5 MG PO TABS: 2.5000 mg | ORAL_TABLET | Freq: Two times a day (BID) | ORAL | 1 refills | Status: DC

## 2021-06-14 NOTE — Discharge Summary (Signed)
Spencer Coleman [id number] DOB: 02/06/1930 DOA: 06/12/2021  PCP: Tracie Harrier, MD  Admit date: 06/12/2021 Discharge date: 06/14/2021  Time spent: 35 minutes  Recommendations for Outpatient Follow-up:  Cardiology f/u 1 week  Check BMP at f/u to assess kidney function    Discharge Diagnoses:  Principal Problem:   Acute on chronic systolic CHF (congestive heart failure) ([place]) Active Problems:   Acute renal failure superimposed on stage 3a chronic kidney disease (HCC)   Delirium   CAD (coronary artery disease)   Elevated troponin   Essential hypertension   Ventricular tachycardia   HLD (hyperlipidemia)   Gallstone   Thrombocytopenia (HCC)   PAF (paroxysmal atrial fibrillation) ([place])   Physical deconditioning   Discharge Condition: stable  Diet recommendation: heart healthy  Filed Weights   06/12/21 0435 06/13/21 0649 06/14/21 0627  Weight: 83.5 kg 77.5 kg 75.3 kg    History of present illness:  From admission h and p Spencer Coleman is a 88 y.o. male with medical history significant of sCHF, hypertension, hyperlipidemia, V. tach, s/p of AICD, CKD-3A, CAD, BPH, thrombocytopenia, who presents with shortness of breath.    Patient states that he has shortness of breath in the past several days, which has been progressively worsening.  Patient also has palpitation, denies chest pain, fever or chills.  Patient has mild dry cough.  Patient was found to have oxygen desaturation to 89% on room air initially, nonrebreather was started, then changed to 2 L oxygen. His shortness breath has improved in ED.  Currently patient is 98% on room air.  Patient denies nausea vomiting, diarrhea or abdominal pain.  No fever or chills.  No symptoms of UTI.  Hospital Course:  Patient was admitted for CHF exaerbation. He presented complaining of shortness of breath. He is able to lie flat and has not oxygen requirement. He was treated with IV diuresis for one day. He was evaluated by cardiology who  advised resumption of home meds and no further inpatient evaluation. His pacemaker was interrogated and revealed runs of non-sustained VT, no device therapy initiated. Cardiology advised resumption of home meds. Not clear if patient taking home apixaban so advised patient's sister to confirm that and if not a prescription was sent to his pharmacy. Creatinine here 1.5-1.7 from baseline of around 1.3, will need check of kidney function at f/u. PT/OT advised SNF but patient declined. HH PT/OT/RN ordered. Of note patient did suffer from intermittent delirium during his hospitalization. Was calm and oriented at time of discharge.   Procedures: none   Consultations: cardiology  Discharge Exam: Vitals:   06/14/21 0627 06/14/21 0718  BP: 120/82 127/78  Pulse: 67 70  Resp: 18 18  Temp: 97.9 F (36.6 C) 97.8 F (36.6 C)  SpO2: 95% 96%    General: NAD Cardiovascular: CTAB Respiratory: mild systolic murmur Ext: no edema  Discharge Instructions   Discharge Instructions     Diet - low sodium heart healthy   Complete by: As directed    Increase activity slowly   Complete by: As directed       Allergies as of 06/14/2021   No Known Allergies      Medication List     TAKE these medications    acetaminophen 500 MG tablet Commonly known as: TYLENOL Take 2 tablets (1,000 mg total) by mouth every 6 (six) hours.   amiodarone 400 MG tablet Commonly known as: PACERONE Take 400 mg by mouth daily.   apixaban 2.5 MG Tabs tablet Commonly  known as: ELIQUIS Take 1 tablet (2.5 mg total) by mouth 2 (two) times daily.   furosemide 20 MG tablet Commonly known as: LASIX Take 20 mg by mouth daily.   metoprolol succinate 25 MG 24 hr tablet Commonly known as: TOPROL-XL Take 25 mg by mouth daily.   simvastatin 40 MG tablet Commonly known as: ZOCOR Take 40 mg by mouth at bedtime.       No Known Allergies  Follow-up Information     Paraschos, Alexander, MD. Go in 1 week(s).    Specialty: Cardiology Contact information: [facility] [place] [address] [phone]         Tracie Harrier, MD Follow up.   Specialty: Internal Medicine Contact information: [address] [place] [address] [phone]                  The results of significant diagnostics from this hospitalization (including imaging, microbiology, ancillary and laboratory) are listed below for reference.    Significant Diagnostic Studies: CT HEAD WO CONTRAST (5MM)  Result Date: 06/12/2021 CLINICAL DATA:  Mental status change. EXAM: CT HEAD WITHOUT CONTRAST TECHNIQUE: Contiguous axial images were obtained from the base of the skull through the vertex without intravenous contrast. RADIATION DOSE REDUCTION: This exam was performed according to the departmental dose-optimization program which includes automated exposure control, adjustment of the mA and/or kV according to patient size and/or use of iterative reconstruction technique. COMPARISON:  Jun 04, 2016 FINDINGS: Brain: No subdural, epidural, or subarachnoid hemorrhage. Cerebellum, brainstem, and basal cisterns are unremarkable. Ventricles and sulci are stable. No acute cortical ischemia or infarct identified. No mass effect or midline shift. Vascular: Calcified atherosclerotic changes in the intracranial carotid arteries. Skull: Normal. Negative for fracture or focal lesion. Sinuses/Orbits: No acute finding. Other: None. IMPRESSION: No acute intracranial abnormalities are identified. Electronically Signed   By: Dorise Bullion III M.D.   On: 06/12/2021 13:14   CT Chest Wo Contrast  Result Date: 06/12/2021 CLINICAL DATA:  88 year old male with history of respiratory illness. Abnormal chest x-ray. EXAM: CT CHEST WITHOUT CONTRAST TECHNIQUE: Multidetector CT imaging of the chest was performed following the standard protocol without IV contrast. RADIATION DOSE  REDUCTION: This exam was performed according to the departmental dose-optimization program which includes automated exposure control, adjustment of the mA and/or kV according to patient size and/or use of iterative reconstruction technique. COMPARISON:  Chest CT 06/07/2007. FINDINGS: Cardiovascular: Heart size is mildly enlarged. There is no significant pericardial fluid, thickening or pericardial calcification. There is aortic atherosclerosis, as well as atherosclerosis of the great vessels of the mediastinum and the coronary arteries, including calcified atherosclerotic plaque in the left main, left anterior descending, left circumflex and right coronary arteries. Left-sided biventricular pacemaker/AICD with lead tips terminating in the right atrium, right ventricular apex and overlying the lateral wall the left ventricle via the coronary sinus and coronary veins. Calcifications of the aortic valve. Mediastinum/Nodes: No pathologically enlarged mediastinal or hilar lymph nodes. Esophagus is unremarkable in appearance. No axillary lymphadenopathy. Lungs/Pleura: Areas of scarring and/or atelectasis in the right lung base. No acute consolidative airspace disease. No pleural effusions. No suspicious appearing pulmonary nodules or masses are noted. Upper Abdomen: Severe elevation of the right hemidiaphragm again noted. Calcified gallstones in the neck of the gallbladder. Gallbladder appears nearly decompressed, however, gallbladder wall appears thickened and edematous with trace volume of pericholecystic fluid, concerning for probable acute cholecystitis. Incompletely imaged well-circumscribed exophytic lesion in the upper  pole of the left kidney is low-attenuation (statistically likely a cyst, no imaging follow-up recommended) measuring at least 5.3 cm in diameter. Extensive atherosclerosis of the abdominal aorta and visualized mesenteric vasculature. Musculoskeletal: There are no aggressive appearing lytic or blastic  lesions noted in the visualized portions of the skeleton. IMPRESSION: 1. Cholelithiasis with evidence of cholecystitis. Further clinical evaluation is recommended. This could be better evaluated with right upper quadrant abdominal ultrasound if clinically appropriate. 2. Severe chronic elevation of the right hemidiaphragm redemonstrated. No acute findings in the thorax otherwise noted to account for the patient's symptoms. 3. Mild cardiomegaly. 4. Aortic atherosclerosis, in addition to left main and three-vessel coronary artery disease. 5. There are calcifications of the aortic valve. Echocardiographic correlation for evaluation of potential valvular dysfunction may be warranted if clinically indicated. Aortic Atherosclerosis (ICD10-I70.0). Electronically Signed   By: Vinnie Langton M.D.   On: 06/12/2021 05:40   DG Chest Port 1 View  Result Date: 06/12/2021 CLINICAL DATA:  88 year old male with history of acute dyspnea and hypoxemia. Difficulty breathing for the past 3 days. EXAM: PORTABLE CHEST 1 VIEW COMPARISON:  Chest x-ray 01/06/2021. FINDINGS: Worsening severe chronic elevation of the right hemidiaphragm. Opacity at the right base which likely reflects passive atelectasis. Left lung appears clear. No pleural effusions. No pneumothorax. No evidence of pulmonary edema. Heart size appears mildly enlarged. Upper mediastinal contours are within normal limits allowing for patient positioning. Atherosclerotic calcifications in the thoracic aorta. Left-sided biventricular pacemaker/AICD with lead tips projecting over the expected location of the right atrium, right ventricle and left ventricle via the coronary sinus and coronary veins. IMPRESSION: 1. Severe worsening chronic elevation of the right hemidiaphragm with probable passive subsegmental atelectasis in the right lung base. 2. Mild cardiomegaly. 3. Aortic atherosclerosis. Electronically Signed   By: Vinnie Langton M.D.   On: 06/12/2021 05:02    ECHOCARDIOGRAM COMPLETE  Result Date: 06/13/2021    ECHOCARDIOGRAM REPORT   Patient Name:   Spencer Coleman Date of Exam: 06/13/2021 Medical Rec #:  [id number]    Height:       70.0 in Accession #:    [id number]   Weight:       170.8 lb Date of Birth:  January 28, 1930     BSA:          1.952 m Patient Age:    86 years     BP:           145/97 mmHg Patient Gender: M            HR:           74 bpm. Exam Location:  [facility] Procedure: 2D Echo, Cardiac Doppler and Color Doppler Indications:     [id number] CHF acute systolic  History:         Patient has no prior history of Echocardiogram examinations.                  Cardiomegaly and CHF, CAD, Defibrillator, Arrythmias:Atrial                  Fibrillation; Risk Factors:Hypertension and Dyslipidemia.  Sonographer:     Rosalia Hammers Referring Phys:  [id number] Soledad Gerlach NIU Diagnosing Phys: Isaias Cowman MD IMPRESSIONS  1. Left ventricular ejection fraction, by estimation, is 20 to 25%. The left ventricle has severely decreased function. The left ventricle has no regional wall motion abnormalities. The left ventricular internal cavity size was moderately to severely dilated. Left ventricular diastolic parameters are consistent with  Grade I diastolic dysfunction (impaired relaxation).  2. Right ventricular systolic function is normal. The right ventricular size is normal.  3. The mitral valve is normal in structure. Moderate to severe mitral valve regurgitation. No evidence of mitral stenosis.  4. Tricuspid valve regurgitation is mild to moderate.  5. The aortic valve is normal in structure. Aortic valve regurgitation is mild to moderate. Mild aortic valve stenosis.  6. The inferior vena cava is normal in size with greater than 50% respiratory variability, suggesting right atrial pressure of 3 mmHg. FINDINGS  Left Ventricle: Left ventricular ejection fraction, by estimation, is 20 to 25%. The left ventricle has severely decreased function. The left ventricle has no regional wall motion  abnormalities. The left ventricular internal cavity size was moderately to severely dilated. There is no left ventricular hypertrophy. Left ventricular diastolic parameters are consistent with Grade I diastolic dysfunction (impaired relaxation). Right Ventricle: The right ventricular size is normal. No increase in right ventricular wall thickness. Right ventricular systolic function is normal. Left Atrium: Left atrial size was normal in size. Right Atrium: Right atrial size was normal in size. Pericardium: There is no evidence of pericardial effusion. Mitral Valve: The mitral valve is normal in structure. Moderate to severe mitral valve regurgitation. No evidence of mitral valve stenosis. MV peak gradient, 4.6 mmHg. The mean mitral valve gradient is 2.0 mmHg. Tricuspid Valve: The tricuspid valve is normal in structure. Tricuspid valve regurgitation is mild to moderate. No evidence of tricuspid stenosis. Aortic Valve: The aortic valve is normal in structure. Aortic valve regurgitation is mild to moderate. Aortic regurgitation PHT measures 528 msec. Mild aortic stenosis is present. Aortic valve mean gradient measures 6.0 mmHg. Aortic valve peak gradient measures 9.9 mmHg. Aortic valve area, by VTI measures 0.86 cm. Pulmonic Valve: The pulmonic valve was normal in structure. Pulmonic valve regurgitation is not visualized. No evidence of pulmonic stenosis. Aorta: The aortic root is normal in size and structure. Venous: The inferior vena cava is normal in size with greater than 50% respiratory variability, suggesting right atrial pressure of 3 mmHg. IAS/Shunts: No atrial level shunt detected by color flow Doppler.  LEFT VENTRICLE PLAX 2D LVIDd:         5.69 cm LVIDs:         5.21 cm LV PW:         1.27 cm LV IVS:        1.16 cm LVOT diam:     1.70 cm LV SV:         29 LV SV Index:   15 LVOT Area:     2.27 cm  RIGHT VENTRICLE RV Basal diam:  3.81 cm LEFT ATRIUM            Index        RIGHT ATRIUM           Index LA  diam:      4.80 cm  2.46 cm/m   RA Area:     11.10 cm LA Vol (A2C): 104.0 ml 53.28 ml/m  RA Volume:   27.80 ml  14.24 ml/m LA Vol (A4C): 49.5 ml  25.36 ml/m  AORTIC VALVE                     PULMONIC VALVE AV Area (Vmax):    0.69 cm      PV Vmax:          0.86 m/s AV Area (Vmean):   0.65 cm  PV Vmean:         61.300 cm/s AV Area (VTI):     0.86 cm      PV VTI:           0.183 m AV Vmax:           157.00 cm/s   PV Peak grad:     3.0 mmHg AV Vmean:          112.000 cm/s  PV Mean grad:     2.0 mmHg AV VTI:            0.336 m       PR End Diast Vel: 9.61 msec AV Peak Grad:      9.9 mmHg AV Mean Grad:      6.0 mmHg LVOT Vmax:         47.50 cm/s LVOT Vmean:        32.300 cm/s LVOT VTI:          0.127 m LVOT/AV VTI ratio: 0.38 AI PHT:            528 msec  AORTA Ao Root diam: 3.50 cm MITRAL VALVE MV Area (PHT): 4.54 cm     SHUNTS MV Area VTI:   0.98 cm     Systemic VTI:  0.13 m MV Peak grad:  4.6 mmHg     Systemic Diam: 1.70 cm MV Mean grad:  2.0 mmHg MV Vmax:       1.07 m/s MV Vmean:      74.7 cm/s MV Decel Time: 167 msec MV E velocity: 60.40 cm/s MV A velocity: 107.00 cm/s MV E/A ratio:  0.56 Isaias Cowman MD Electronically signed by Isaias Cowman MD Signature Date/Time: 06/13/2021/1:59:56 PM    Final    US ABDOMEN LIMITED RUQ (LIVER/GB)  Result Date: 06/12/2021 CLINICAL DATA:  88 year old male with gallstones and evidence of gallbladder inflammation on noncontrast chest CT this morning. EXAM: ULTRASOUND ABDOMEN LIMITED RIGHT UPPER QUADRANT COMPARISON:  Chest CT 0525 hours today. FINDINGS: Suboptimal exam due to elevated right hemidiaphragm, acoustic shadowing of the liver by the lung. Gallbladder: Thickened gallbladder wall up to 5 mm. No pericholecystic fluid. Gallstones in the neck of the gallbladder remote better demonstrated by CT this morning. Reportedly no sonographic Murphy sign was elicited. Common bile duct: Diameter: 3 mm, normal. Liver: No focal lesion identified. Within normal  limits in parenchymal echogenicity. Portal vein is patent on color Doppler imaging with normal direction of blood flow towards the liver. Other: No free fluid identified. IMPRESSION: 1. Confirmed abnormal gallbladder wall thickening. But limited ultrasound visualization of the gallbladder due to elevated right hemidiaphragm. Gallstones in the gallbladder neck much better demonstrated by CT today. Despite absent sonographic Murphy sign the constellation is Highly Suspicious For Acute Cholecystitis. 2. No evidence of bile duct obstruction. Electronically Signed   By: Genevie Ann M.D.   On: 06/12/2021 07:09    Microbiology: Recent Results (from the past 240 hour(s))  SARS Coronavirus 2 by RT PCR (hospital order, performed in [facility] hospital lab) *cepheid single result test* Anterior Nasal Swab     Status: None   Collection Time: 06/12/21  5:17 AM   Specimen: Anterior Nasal Swab  Result Value Ref Range Status   SARS Coronavirus 2 by RT PCR NEGATIVE NEGATIVE Final    Comment: (NOTE) SARS-CoV-2 target nucleic acids are NOT DETECTED.  The SARS-CoV-2 RNA is generally detectable in upper and lower respiratory specimens during the acute phase of infection. The  lowest concentration of SARS-CoV-2 viral copies this assay can detect is 250 copies / mL. A negative result does not preclude SARS-CoV-2 infection and should not be used as the sole basis for treatment or other patient management decisions.  A negative result may occur with improper specimen collection / handling, submission of specimen other than nasopharyngeal swab, presence of viral mutation(s) within the areas targeted by this assay, and inadequate number of viral copies (<250 copies / mL). A negative result must be combined with clinical observations, patient history, and epidemiological information.  Fact Sheet for Patients:   https://www.patel.info/  Fact Sheet for Healthcare  Providers: https://hall.com/  This test is not yet approved or  cleared by the [place] FDA and has been authorized for detection and/or diagnosis of SARS-CoV-2 by FDA under an Emergency Use Authorization (EUA).  This EUA will remain in effect (meaning this test can be used) for the duration of the COVID-19 declaration under Section 564(b)(1) of the Act, 21 U.S.C. section 360bbb-3(b)(1), unless the authorization is terminated or revoked sooner.  Performed at [facility], [address] [place], [place] [address]      Labs: Basic Metabolic Panel: Recent Labs  Lab 06/12/21 0433 06/13/21 0556 06/14/21 0623  NA 140 137 138  K 4.7 3.5 3.9  CL 108 106 104  CO2 22 26 28   GLUCOSE 171* 84 97  BUN 36* 32* 39*  CREATININE 1.99* 1.52* 1.75*  CALCIUM 9.3 8.8* 8.7*  MG  --  2.5* 2.5*  PHOS  --   --  4.0   Liver Function Tests: Recent Labs  Lab 06/12/21 0433 06/14/21 0623  AST 34  --   ALT 25  --   ALKPHOS 70  --   BILITOT 1.7*  --   PROT 6.2*  --   ALBUMIN 3.4* 3.4*   Recent Labs  Lab 06/12/21 0433  LIPASE 27   No results for input(s): AMMONIA in the last 168 hours. CBC: Recent Labs  Lab 06/12/21 0433 06/14/21 0623  WBC 6.0 7.7  NEUTROABS 4.1  --   HGB 14.6 14.1  HCT 46.3 42.0  MCV 94.5 91.3  PLT 139* 113*   Cardiac Enzymes: No results for input(s): CKTOTAL, CKMB, CKMBINDEX, TROPONINI in the last 168 hours. BNP: BNP (last 3 results) Recent Labs    01/06/21 1543 06/12/21 0433 06/14/21 0623  BNP 821.2* >4,500.0* 1,331.9*    ProBNP (last 3 results) No results for input(s): PROBNP in the last 8760 hours.  CBG: No results for input(s): GLUCAP in the last 168 hours.     Signed:  Desma Maxim MD.  [facility] 06/14/2021, 10:18 AM

## 2021-06-14 NOTE — Consult Note (Signed)
   Heart Failure Nurse Navigator Note  HFrEF 20 to 25%.  Moderate to severe dilatation of the left ventricle.  Grade 1 diastolic dysfunction.  Moderate to severe mitral regurgitation.  Mild to moderate tricuspid regurgitation.  Moderate aortic insufficiency  Presented to the emergency room with complaints of worsening shortness of breath over several days.  BNP was noted to be greater than 4500.  Comorbidities:  Chronic kidney disease stage III Hypertension Hyperlipidemia Ventricular tachycardia BiV ICD Coronary artery disease  Medications:  Amiodarone 200 mg daily Apixaban 2.5 mg 2 times a day Aspirin 81 mg daily Atorvastatin 20 mg at bedtime Furosemide 20 mg IV 2 times a day Metoprolol succinate 50 mg daily  Labs:  Sodium 138, potassium 3.9, chloride 104, CO2 28, BUN 39, creatinine 1.75, albumin 3.4, GFR 37, magnesium 2.5, BNP 1331, hemoglobin 14.1, hematocrit 42, Weight is 75.3 kg Blood pressure 111/72 Intake 240 mL Output not documented  Initial meeting with patient, he was lying quietly in bed, sitter was present.  He was calm and oriented.  He states that he lives by himself and he goes out to eat quite frequently but he tries to make wise choices as far as picking low sodium.  He states that he does add salt to his food, but tries to use mostly pepper.  Discussed fluid restriction of no more than 64 ounces, he states that he mostly drinks water throughout the day and some days can drink three 8 ounce cups of coffee with breakfast.  He has a scale but does not weigh himself on a daily basis.  Explained the reasoning behind daily weights and what to report.  He listed the medications that he takes at home and states that he is compliant with those.  He was given the living with heart failure teaching booklet, zone magnet, information on low-sodium and heart failure and weight chart.  He has an appointment in the outpatient heart failure clinic on June 9 at 1230 PM,  he has a 12% no-show which is 3 out of 26 appointments.  Pricilla Riffle RN [facility]

## 2021-06-14 NOTE — Progress Notes (Signed)
[facility] Cardiology  SUBJECTIVE: Patient lying flat in bed, denies chest pain or shortness of breath, eager to go home   Vitals:   06/13/21 1919 06/14/21 0050 06/14/21 0627 06/14/21 0718  BP: 123/81 118/78 120/82 127/78  Pulse: 77 69 67 70  Resp: 18 17 18 18   Temp: 97.9 F (36.6 C) 97.8 F (36.6 C) 97.9 F (36.6 C) 97.8 F (36.6 C)  TempSrc:  Oral    SpO2: 97% 98% 95% 96%  Weight:   75.3 kg   Height:         Intake/Output Summary (Last 24 hours) at 06/14/2021 [id number] Last data filed at 06/13/2021 1000 Gross per 24 hour  Intake 240 ml  Output --  Net 240 ml      PHYSICAL EXAM  General: Well developed, well nourished, in no acute distress HEENT:  Normocephalic and atramatic Neck:  No JVD.  Lungs: Clear bilaterally to auscultation and percussion. Heart: HRRR . Normal S1 and S2 without gallops or murmurs.  Abdomen: Bowel sounds are positive, abdomen soft and non-tender  Msk:  Back normal, normal gait. Normal strength and tone for age. Extremities: No clubbing, cyanosis or edema.   Neuro: Alert and oriented X 3. Psych:  Good affect, responds appropriately   LABS: Basic Metabolic Panel: Recent Labs    06/13/21 0556 06/14/21 0623  NA 137 138  K 3.5 3.9  CL 106 104  CO2 26 28  GLUCOSE 84 97  BUN 32* 39*  CREATININE 1.52* 1.75*  CALCIUM 8.8* 8.7*  MG 2.5* 2.5*  PHOS  --  4.0   Liver Function Tests: Recent Labs    06/12/21 0433 06/14/21 0623  AST 34  --   ALT 25  --   ALKPHOS 70  --   BILITOT 1.7*  --   PROT 6.2*  --   ALBUMIN 3.4* 3.4*   Recent Labs    06/12/21 0433  LIPASE 27   CBC: Recent Labs    06/12/21 0433 06/14/21 0623  WBC 6.0 7.7  NEUTROABS 4.1  --   HGB 14.6 14.1  HCT 46.3 42.0  MCV 94.5 91.3  PLT 139* 113*   Cardiac Enzymes: No results for input(s): CKTOTAL, CKMB, CKMBINDEX, TROPONINI in the last 72 hours. BNP: Invalid input(s): POCBNP D-Dimer: No results for input(s): DDIMER in the last 72 hours. Hemoglobin A1C: Recent Labs     06/12/21 1605  HGBA1C 5.6   Fasting Lipid Panel: Recent Labs    06/12/21 0617  CHOL 149  HDL 46  LDLCALC 98  TRIG 27  CHOLHDL 3.2   Thyroid Function Tests: Recent Labs    06/14/21 0623  TSH 0.928   Anemia Panel: No results for input(s): VITAMINB12, FOLATE, FERRITIN, TIBC, IRON, RETICCTPCT in the last 72 hours.  CT HEAD WO CONTRAST (5MM)  Result Date: 06/12/2021 CLINICAL DATA:  Mental status change. EXAM: CT HEAD WITHOUT CONTRAST TECHNIQUE: Contiguous axial images were obtained from the base of the skull through the vertex without intravenous contrast. RADIATION DOSE REDUCTION: This exam was performed according to the departmental dose-optimization program which includes automated exposure control, adjustment of the mA and/or kV according to patient size and/or use of iterative reconstruction technique. COMPARISON:  Jun 04, 2016 FINDINGS: Brain: No subdural, epidural, or subarachnoid hemorrhage. Cerebellum, brainstem, and basal cisterns are unremarkable. Ventricles and sulci are stable. No acute cortical ischemia or infarct identified. No mass effect or midline shift. Vascular: Calcified atherosclerotic changes in the intracranial carotid arteries. Skull: Normal. Negative for  fracture or focal lesion. Sinuses/Orbits: No acute finding. Other: None. IMPRESSION: No acute intracranial abnormalities are identified. Electronically Signed   By: Dorise Bullion III M.D.   On: 06/12/2021 13:14   ECHOCARDIOGRAM COMPLETE  Result Date: 06/13/2021    ECHOCARDIOGRAM REPORT   Patient Name:   Spencer Coleman Date of Exam: 06/13/2021 Medical Rec #:  [id number]    Height:       70.0 in Accession #:    [id number]   Weight:       170.8 lb Date of Birth:  [birth year]     BSA:          1.952 m Patient Age:    88 years     BP:           145/97 mmHg Patient Gender: M            HR:           74 bpm. Exam Location:  [facility] Procedure: 2D Echo, Cardiac Doppler and Color Doppler Indications:     [id number] CHF acute systolic   History:         Patient has no prior history of Echocardiogram examinations.                  Cardiomegaly and CHF, CAD, Defibrillator, Arrythmias:Atrial                  Fibrillation; Risk Factors:Hypertension and Dyslipidemia.  Sonographer:     Rosalia Hammers Referring Phys:  [id number] Soledad Gerlach NIU Diagnosing Phys: Isaias Cowman MD IMPRESSIONS  1. Left ventricular ejection fraction, by estimation, is 20 to 25%. The left ventricle has severely decreased function. The left ventricle has no regional wall motion abnormalities. The left ventricular internal cavity size was moderately to severely dilated. Left ventricular diastolic parameters are consistent with Grade I diastolic dysfunction (impaired relaxation).  2. Right ventricular systolic function is normal. The right ventricular size is normal.  3. The mitral valve is normal in structure. Moderate to severe mitral valve regurgitation. No evidence of mitral stenosis.  4. Tricuspid valve regurgitation is mild to moderate.  5. The aortic valve is normal in structure. Aortic valve regurgitation is mild to moderate. Mild aortic valve stenosis.  6. The inferior vena cava is normal in size with greater than 50% respiratory variability, suggesting right atrial pressure of 3 mmHg. FINDINGS  Left Ventricle: Left ventricular ejection fraction, by estimation, is 20 to 25%. The left ventricle has severely decreased function. The left ventricle has no regional wall motion abnormalities. The left ventricular internal cavity size was moderately to severely dilated. There is no left ventricular hypertrophy. Left ventricular diastolic parameters are consistent with Grade I diastolic dysfunction (impaired relaxation). Right Ventricle: The right ventricular size is normal. No increase in right ventricular wall thickness. Right ventricular systolic function is normal. Left Atrium: Left atrial size was normal in size. Right Atrium: Right atrial size was normal in size. Pericardium:  There is no evidence of pericardial effusion. Mitral Valve: The mitral valve is normal in structure. Moderate to severe mitral valve regurgitation. No evidence of mitral valve stenosis. MV peak gradient, 4.6 mmHg. The mean mitral valve gradient is 2.0 mmHg. Tricuspid Valve: The tricuspid valve is normal in structure. Tricuspid valve regurgitation is mild to moderate. No evidence of tricuspid stenosis. Aortic Valve: The aortic valve is normal in structure. Aortic valve regurgitation is mild to moderate. Aortic regurgitation PHT measures 528 msec. Mild aortic stenosis is present. Aortic valve mean  gradient measures 6.0 mmHg. Aortic valve peak gradient measures 9.9 mmHg. Aortic valve area, by VTI measures 0.86 cm. Pulmonic Valve: The pulmonic valve was normal in structure. Pulmonic valve regurgitation is not visualized. No evidence of pulmonic stenosis. Aorta: The aortic root is normal in size and structure. Venous: The inferior vena cava is normal in size with greater than 50% respiratory variability, suggesting right atrial pressure of 3 mmHg. IAS/Shunts: No atrial level shunt detected by color flow Doppler.  LEFT VENTRICLE PLAX 2D LVIDd:         5.69 cm LVIDs:         5.21 cm LV PW:         1.27 cm LV IVS:        1.16 cm LVOT diam:     1.70 cm LV SV:         29 LV SV Index:   15 LVOT Area:     2.27 cm  RIGHT VENTRICLE RV Basal diam:  3.81 cm LEFT ATRIUM            Index        RIGHT ATRIUM           Index LA diam:      4.80 cm  2.46 cm/m   RA Area:     11.10 cm LA Vol (A2C): 104.0 ml 53.28 ml/m  RA Volume:   27.80 ml  14.24 ml/m LA Vol (A4C): 49.5 ml  25.36 ml/m  AORTIC VALVE                     PULMONIC VALVE AV Area (Vmax):    0.69 cm      PV Vmax:          0.86 m/s AV Area (Vmean):   0.65 cm      PV Vmean:         61.300 cm/s AV Area (VTI):     0.86 cm      PV VTI:           0.183 m AV Vmax:           157.00 cm/s   PV Peak grad:     3.0 mmHg AV Vmean:          112.000 cm/s  PV Mean grad:     2.0 mmHg AV  VTI:            0.336 m       PR End Diast Vel: 9.61 msec AV Peak Grad:      9.9 mmHg AV Mean Grad:      6.0 mmHg LVOT Vmax:         47.50 cm/s LVOT Vmean:        32.300 cm/s LVOT VTI:          0.127 m LVOT/AV VTI ratio: 0.38 AI PHT:            528 msec  AORTA Ao Root diam: 3.50 cm MITRAL VALVE MV Area (PHT): 4.54 cm     SHUNTS MV Area VTI:   0.98 cm     Systemic VTI:  0.13 m MV Peak grad:  4.6 mmHg     Systemic Diam: 1.70 cm MV Mean grad:  2.0 mmHg MV Vmax:       1.07 m/s MV Vmean:      74.7 cm/s MV Decel Time: 167 msec MV E velocity: 60.40 cm/s MV A velocity: 107.00 cm/s MV E/A ratio:  0.56 Isaias Cowman MD Electronically signed by Isaias Cowman MD Signature Date/Time: 06/13/2021/1:59:56 PM    Final      Echo LVEF 20-25%, moderate to severe mitral regurgitation  TELEMETRY: Atrial and ventricular pacing:  ASSESSMENT AND PLAN:  Principal Problem:   Acute on chronic systolic CHF (congestive heart failure) (HCC) Active Problems:   Essential hypertension   HLD (hyperlipidemia)   Ventricular tachycardia   Acute renal failure superimposed on stage 3a chronic kidney disease (HCC)   Gallstone   Elevated troponin   Thrombocytopenia (HCC)   CAD (coronary artery disease)   Delirium   PAF (paroxysmal atrial fibrillation) (HCC)   Physical deconditioning    1.  Acute on chronic systolic congestive heart failure, HFrEF LVEF 40%, does not appear to be fluid overloaded, chest x-ray does not reveal pulmonary edema, BNP greater than 4500 2.  Mildly elevated troponin (109, 121, 146), in the absence of chest pain, with previous cardiac catheterization revealing insignificant coronary artery disease 3.  BiV ICD 08/09/2007, status post generator change out 01/28/2021, interrogation 06/12/2021 revealed normal BiV ICD function, recent brief episodes of nonsustained VT, less than 150 bpm, without device therapy.  No episodes of VT observed on telemetry. 4.  History of ventricular tachycardia, previous  shocks 03/09/2016, 12/29/2020, with history of noncompliance with amiodarone 5.  Paroxysmal atrial fibrillation, on low-dose Eliquis for stroke prevention, and amiodarone for rate and rhythm control 6.  Essential hypertension, blood pressure mildly elevated   Recommendations   1.  Continue current medication 2.  Continue Eliquis 2.5 mg twice daily 3.  Uptitrate metoprolol succinate to 50 mg daily 4.  Continue amiodarone 200 mg daily 5.  Stressed the importance with patient about compliance with amiodarone 6.  Follow-up with me 1 to 2 weeks following discharge   Isaias Cowman, MD, PhD, [facility] 06/14/2021 8:37 AM

## 2021-06-14 NOTE — TOC Transition Note (Signed)
Transition of Care [facility]) - CM/SW Discharge Note   Patient Details  Name: Spencer Coleman MRN: [id number] Date of Birth: 1930/10/27  [facility] of Care [facility] CM/SW Contact:  Truddie Hidden, RN Phone Number: 06/14/2021, 1:06 PM   Clinical Narrative:    Spoke with patient's sister Drema Balzarine Patient's sister stated she would be able to pick the patient up for discharge today. Advised of order for [facility] PT/ OT/ RN. [name] did not have a agency preference. Referral sent and accepted by Becky Sax at [facility]. Provided [facility]  contact information to [facility]. They will provided information on initiation of service. TOC signing off.          Patient Goals and CMS Choice        Discharge Placement                       Discharge Plan and Services                                     Social Determinants of Health (SDOH) Interventions     Readmission Risk Interventions     View : No data to display.

## 2021-06-17 LAB — BLOOD GAS, VENOUS
Acid-Base Excess: 1.9 mmol/L (ref 0.0–2.0)
Bicarbonate: 29 mmol/L — ABNORMAL HIGH (ref 20.0–28.0)
Patient temperature: 37
pCO2, Ven: 55 mmHg (ref 44–60)
pH, Ven: 7.33 (ref 7.25–7.43)

## 2021-06-17 NOTE — Progress Notes (Signed)
Patient ID: Spencer Coleman, male    DOB: 09/16/1930, 88 y.o.   MRN: [id number]  HPI  [name] is a 88 y/o male with a  history of CAD, hyperlipidemia, HTN, CKD, PAF, renal colic, patella fracture, VT with AICD and chronic heart failure.   Echo report from 06/13/21 reviewed and showed an EF of 20-25% along with moderate/severe MR and mild/moderate TR.   Admitted 06/12/21 due to SOB. Needed oxygen at 2L but then weaned to room air. Initially given IV lasix with transition to oral diuretics. Cardiology and surgery consults obtained. Discharged after 2 days.   He presents today for his initial visit with a chief complaint of moderate shortness of breath with minimal exertion. Describes this as chronic but resolves quickly with rest. Sister noticed that he got very short of breath just walking from the door of his home to the car which she says is not a very long walk. He has associated fatigue along with this. He denies any difficulty sleeping, dizziness, abdominal distention, palpitations, pedal edema, chest pain or cough.   Just recently started weighing himself daily but will forget to write it down. Adding "very little" salt to his food.   Past Medical History:  Diagnosis Date   CAD (coronary artery disease)    Nonobstructive cardiac disease by cath   CHF (congestive heart failure) (HCC)    nonischemic cardiomyopathy   CKD (chronic kidney disease)    History of kidney stones    Hyperlipidemia    Hypertension    Inguinal hernia    PAF (paroxysmal atrial fibrillation) (HCC)    Patella fracture 01/09/2015   Renal colic 10/07/2011   Ventricular tachycardia [facility](HCC)    s/p ICD   Past Surgical History:  Procedure Laterality Date   CARDIAC CATHETERIZATION     CARDIAC DEFIBRILLATOR PLACEMENT  2009   CYSTOSCOPY WITH LITHOLAPAXY N/A 02/21/2018   Procedure: CYSTOSCOPY WITH LITHOLAPAXY;  Surgeon: Sondra ComeSninsky, Brian C, MD;  Location: [facility] ORS;  Service: Urology;  Laterality: N/A;   ELBOW SURGERY Left     HOLMIUM LASER APPLICATION N/A 02/21/2018   Procedure: HOLMIUM LASER APPLICATION;  Surgeon: Sondra ComeSninsky, Brian C, MD;  Location: [facility] ORS;  Service: Urology;  Laterality: N/A;   ICD GENERATOR CHANGEOUT N/A 01/28/2021   Procedure: ICD GENERATOR CHANGEOUT;  Surgeon: Marcina MillardParaschos, Alexander, MD;  Location: [facility] INVASIVE CV LAB;  Service: Cardiovascular;  Laterality: N/A;   INGUINAL HERNIA REPAIR     LITHOTRIPSY     ORIF left wrist fracture Left    ORIF PATELLA Left 01/10/2015   Procedure: OPEN REDUCTION INTERNAL (ORIF) FIXATION PATELLA;  Surgeon: Donato HeinzJames P Hooten, MD;  Location: [facility] ORS;  Service: Orthopedics;  Laterality: Left;   ORIF PATELLA Left 02/06/2015   Procedure: OPEN REDUCTION INTERNAL (ORIF) FIXATION PATELLA;  Surgeon: Donato HeinzJames P Hooten, MD;  Location: [facility] ORS;  Service: Orthopedics;  Laterality: Left;   TRANSURETHRAL RESECTION OF PROSTATE N/A 02/21/2018   Procedure: TRANSURETHRAL RESECTION OF THE PROSTATE (TURP);  Surgeon: Sondra ComeSninsky, Brian C, MD;  Location: [facility] ORS;  Service: Urology;  Laterality: N/A;   Family History  Problem Relation Age of Onset   CAD Mother    Kidney cancer Father    Prostate cancer Neg Hx    Bladder Cancer Neg Hx    Social History   Tobacco Use   Smoking status: Never   Smokeless tobacco: Never  Substance Use Topics   Alcohol use: No    Alcohol/week: 0.0 standard drinks of alcohol  No Known Allergies Prior to Admission medications   Medication Sig Start Date End Date Taking? Authorizing Provider  acetaminophen (TYLENOL) 500 MG tablet Take 2 tablets (1,000 mg total) by mouth every 6 (six) hours. 02/22/18  Yes Sondra Come, MD  amiodarone (PACERONE) 400 MG tablet Take 400 mg by mouth daily.   Yes [provider]  apixaban (ELIQUIS) 2.5 MG TABS tablet Take 1 tablet (2.5 mg total) by mouth 2 (two) times daily. 06/14/21  Yes Wouk, Wilfred Curtis, MD  furosemide (LASIX) 20 MG tablet Take 20 mg by mouth daily. 11/09/20  Yes [provider]  metoprolol  succinate (TOPROL-XL) 25 MG 24 hr tablet Take 25 mg by mouth daily. 03/17/21  Yes [provider]  simvastatin (ZOCOR) 40 MG tablet Take 40 mg by mouth at bedtime. 11/09/20  Yes [provider]    Review of Systems  Constitutional:  Positive for fatigue. Negative for appetite change.  HENT:  Negative for congestion, postnasal drip and sore throat.   Eyes: Negative.   Respiratory:  Positive for shortness of breath (easily). Negative for cough and chest tightness.   Cardiovascular:  Negative for chest pain, palpitations and leg swelling.  Gastrointestinal:  Negative for abdominal distention and abdominal pain.  Endocrine: Negative.   Genitourinary: Negative.   Musculoskeletal:  Negative for back pain and neck pain.  Skin: Negative.   Allergic/Immunologic: Negative.   Neurological:  Negative for dizziness and light-headedness.  Hematological:  Negative for adenopathy. Does not bruise/bleed easily.  Psychiatric/Behavioral:  Negative for dysphoric mood and sleep disturbance (sleeping on 1 pillow). The patient is not nervous/anxious.    Vitals:   06/18/21 1004  BP: 116/76  Pulse: 73  Resp: 16  SpO2: 95%  Weight: 166 lb 2 oz (75.4 kg)  Height: 5\' 10"  (1.778 m)   Wt Readings from Last 3 Encounters:  06/18/21 166 lb 2 oz (75.4 kg)  06/14/21 166 lb 1.6 oz (75.3 kg)  01/28/21 175 lb (79.4 kg)   Lab Results  Component Value Date   CREATININE 1.75 (H) 06/14/2021   CREATININE 1.52 (H) 06/13/2021   CREATININE 1.99 (H) 06/12/2021   Physical Exam Vitals and nursing note reviewed. Exam conducted with a chaperone present (sister).  Constitutional:      Appearance: Normal appearance.  HENT:     Head: Normocephalic and atraumatic.  Cardiovascular:     Rate and Rhythm: Normal rate and regular rhythm.  Pulmonary:     Effort: Pulmonary effort is normal. No respiratory distress.     Breath sounds: No wheezing or rales.  Abdominal:     General: There is no distension.      Palpations: Abdomen is soft.     Tenderness: There is no abdominal tenderness.  Musculoskeletal:        General: No tenderness.     Cervical back: Normal range of motion and neck supple.     Right lower leg: No edema.     Left lower leg: No edema.  Skin:    General: Skin is warm and dry.  Neurological:     General: No focal deficit present.     Mental Status: He is alert and oriented to person, place, and time.  Psychiatric:        Mood and Affect: Mood normal.        Behavior: Behavior normal.        Thought Content: Thought content normal.   Assessment & Plan:  1: Chronic heart failure with  reduced ejection fraction- - NYHA class III - euvolemic today - just started weighing daily a couple of days ago but forgets to write it down; reviewed the importance of writing it down and to call for an overnight weight gain of > 2 pounds or a weekly weight gain of > 5 pounds - adds "very little" salt to his food and he was encouraged to not add any - on GDMT of metoprolol - unsure if BP could tolerate other GDMT - BNP 06/14/21 was 1331.9 - PharmD reconciled medications with the patient  2: HTN with CKD- - BP looks good (116/76) - saw PCP (Hande) 04/09/21; returns 08/09/21 - BMP 06/14/21 reviewed and showed sodium 138, potassium 3.9, creatinine 1.75 and GFR 37  3: VT- - AICD placed   - saw cardiology (Paraschos) 05/03/21; returns next week - PharmD noted that patient was still taking 400mg  amiodarone and had been for several months; she reached out to Dr. who said that he wanted him on 200mg  daily - will call patient and his sister to explain this as patient had already left before we got the answer   Patient did not bring his medications nor a list. Each medication was verbally reviewed with the patient and he was encouraged to bring the bottles to every visit to confirm accuracy of list. Emphasized the importance of bringing medication bottles to every provider visit including his  PCP and cardiologist.   Return in 2 months, sooner if needed.

## 2021-06-18 ENCOUNTER — Telehealth: Payer: Self-pay

## 2021-06-18 ENCOUNTER — Ambulatory Visit: Payer: Medicare HMO | Attending: Family | Admitting: Family

## 2021-06-18 ENCOUNTER — Encounter: Payer: Self-pay | Admitting: Family

## 2021-06-18 VITALS — BP 116/76 | HR 73 | Resp 16 | Ht 70.0 in | Wt 166.1 lb

## 2021-06-18 DIAGNOSIS — I5022 Chronic systolic (congestive) heart failure: Secondary | ICD-10-CM | POA: Diagnosis present

## 2021-06-18 DIAGNOSIS — R0602 Shortness of breath: Secondary | ICD-10-CM | POA: Diagnosis not present

## 2021-06-18 DIAGNOSIS — E785 Hyperlipidemia, unspecified: Secondary | ICD-10-CM | POA: Insufficient documentation

## 2021-06-18 DIAGNOSIS — I420 Dilated cardiomyopathy: Secondary | ICD-10-CM

## 2021-06-18 DIAGNOSIS — I10 Essential (primary) hypertension: Secondary | ICD-10-CM

## 2021-06-18 DIAGNOSIS — I130 Hypertensive heart and chronic kidney disease with heart failure and stage 1 through stage 4 chronic kidney disease, or unspecified chronic kidney disease: Secondary | ICD-10-CM | POA: Diagnosis present

## 2021-06-18 DIAGNOSIS — Z95810 Presence of automatic (implantable) cardiac defibrillator: Secondary | ICD-10-CM | POA: Insufficient documentation

## 2021-06-18 DIAGNOSIS — N189 Chronic kidney disease, unspecified: Secondary | ICD-10-CM | POA: Diagnosis not present

## 2021-06-18 DIAGNOSIS — I2510 Atherosclerotic heart disease of native coronary artery without angina pectoris: Secondary | ICD-10-CM | POA: Insufficient documentation

## 2021-06-18 DIAGNOSIS — I480 Paroxysmal atrial fibrillation: Secondary | ICD-10-CM | POA: Diagnosis not present

## 2021-06-18 DIAGNOSIS — I472 Ventricular tachycardia, unspecified: Secondary | ICD-10-CM | POA: Diagnosis not present

## 2021-06-18 DIAGNOSIS — Z79899 Other long term (current) drug therapy: Secondary | ICD-10-CM | POA: Insufficient documentation

## 2021-06-18 NOTE — Telephone Encounter (Signed)
Called [name] (patient's sister). Advised patient's that amiodarone dose will be reduced per cardiology. Advised patient sister to split tablets and take 1/2 tablet once daily (amiodarone 200 mg daily)  Patient verbalized understanding   Jaynie Bream, PharmD Pharmacy Resident  06/18/2021 1:17 PM

## 2021-06-18 NOTE — Progress Notes (Signed)
[place] - PHARMACIST COUNSELING NOTE  Guideline-Directed Medical Therapy/Evidence Based Medicine  ACE/ARB/ARNI: None Beta Blocker: Metoprolol succinate 25 mg daily Aldosterone Antagonist: None Diuretic: Furosemide 20 mg daily SGLT2i: None  Adherence Assessment  Do you ever forget to take your medication? [] Yes [x] No  Do you ever skip doses due to side effects? [] Yes [x] No  Do you have trouble affording your medicines? [] Yes [x] No  Are you ever unable to pick up your medication due to transportation difficulties? [] Yes [x] No  Do you ever stop taking your medications because you don't believe they are helping? [] Yes [x] No  Do you check your weight daily? [] Yes [x] No   Adherence strategy: organizes on kitchen table. Takes medications at the same time each day (or same time for twice daily medications)  Barriers to obtaining medications: sister assists in care  Vital signs: HR 73, BP 116/76, weight (pounds) 166 lb ECHO: Date 06/13/2021, EF 20-25%, notes: grade I diastolic dysfunction     Latest Ref Rng & Units 06/14/2021    6:23 AM 06/13/2021    5:56 AM 06/12/2021    4:33 AM  BMP  Glucose 70 - 99 mg/dL 97  84  171   BUN 8 - 23 mg/dL 39  32  36   Creatinine 0.61 - 1.24 mg/dL 1.75  1.52  1.99   Sodium 135 - 145 mmol/L 138  137  140   Potassium 3.5 - 5.1 mmol/L 3.9  3.5  4.7   Chloride 98 - 111 mmol/L 104  106  108   CO2 22 - 32 mmol/L 28  26  22    Calcium 8.9 - 10.3 mg/dL 8.7  8.8  9.3     Past Medical History:  Diagnosis Date   CAD (coronary artery disease)    Nonobstructive cardiac disease by cath   CHF (congestive heart failure) (HCC)    nonischemic cardiomyopathy   CKD (chronic kidney disease)    History of kidney stones    Hyperlipidemia    Hypertension    Inguinal hernia    Patella fracture Q000111Q   Renal colic Q000111Q   Ventricular tachycardia [facility])    s/p ICD    ASSESSMENT 88 year old male who presents  to the HF clinic for follow up. PMH relevant to sCHF, HTN, HLD, V.tach s/p AICD, CKD-3, CAD, BPH, atrial fibrillation on Eliquis (CHADSVasc 6; age > 73, Scr > 1.5), thrombocytopenia. Patient reports taking amiodarone 400 mg daily. Pharmacy fill history indicates that patient has been on 400 mg dose since at least January of 2023. Does not check blood pressure at home due to misplacing blood pressure monitor. Started checking weight on Wednesday of this week (06/16/2021), but cannot recall values.    Recent ED Visit (past 6 months): Date - 06/12/2021, CC - CHF exacerbation.   PLAN Reconciled medications. Suggest reducing amiodarone to 200 mg daily. Messaged cardiology (via staff message) Educated on blood pressure and daily weight recordings. Advised to bring blood pressure and weight logs to future appointments.  Time spent: 15 minutes  Glean Salvo, PharmD Pharmacy Resident  06/18/2021 10:10 AM  Current Outpatient Medications:    acetaminophen (TYLENOL) 500 MG tablet, Take 2 tablets (1,000 mg total) by mouth every 6 (six) hours., Disp: 30 tablet, Rfl: 0   amiodarone (PACERONE) 400 MG tablet, Take 400 mg by mouth daily., Disp: , Rfl:    apixaban (ELIQUIS) 2.5 MG TABS tablet, Take 1 tablet (2.5 mg total) by mouth  2 (two) times daily., Disp: 60 tablet, Rfl: 1   furosemide (LASIX) 20 MG tablet, Take 20 mg by mouth daily., Disp: , Rfl:    metoprolol succinate (TOPROL-XL) 25 MG 24 hr tablet, Take 25 mg by mouth daily., Disp: , Rfl:    simvastatin (ZOCOR) 40 MG tablet, Take 40 mg by mouth at bedtime., Disp: , Rfl:    COUNSELING POINTS/CLINICAL PEARLS   DRUGS TO CAUTION IN HEART FAILURE  Drug or Class Mechanism  Analgesics NSAIDs COX-2 inhibitors Glucocorticoids  Sodium and water retention, increased systemic vascular resistance, decreased response to diuretics   Diabetes Medications Metformin Thiazolidinediones Rosiglitazone (Avandia) Pioglitazone (Actos) DPP4  Inhibitors Saxagliptin (Onglyza) Sitagliptin (Januvia)   Lactic acidosis Possible calcium channel blockade   Unknown  Antiarrhythmics Class I  Flecainide Disopyramide Class III Sotalol Other Dronedarone  Negative inotrope, proarrhythmic   Proarrhythmic, beta blockade  Negative inotrope  Antihypertensives Alpha Blockers Doxazosin Calcium Channel Blockers Diltiazem Verapamil Nifedipine Central Alpha Adrenergics Moxonidine Peripheral Vasodilators Minoxidil  Increases renin and aldosterone  Negative inotrope    Possible sympathetic withdrawal  Unknown  Anti-infective Itraconazole Amphotericin B  Negative inotrope Unknown  Hematologic Anagrelide Cilostazol   Possible inhibition of PD IV Inhibition of PD III causing arrhythmias  Neurologic/Psychiatric Stimulants Anti-Seizure Drugs Carbamazepine Pregabalin Antidepressants Tricyclics Citalopram Parkinsons Bromocriptine Pergolide Pramipexole Antipsychotics Clozapine Antimigraine Ergotamine Methysergide Appetite suppressants Bipolar Lithium  Peripheral alpha and beta agonist activity  Negative inotrope and chronotrope Calcium channel blockade  Negative inotrope, proarrhythmic Dose-dependent QT prolongation  Excessive serotonin activity/valvular damage Excessive serotonin activity/valvular damage Unknown  IgE mediated hypersensitivy, calcium channel blockade  Excessive serotonin activity/valvular damage Excessive serotonin activity/valvular damage Valvular damage  Direct myofibrillar degeneration, adrenergic stimulation  Antimalarials Chloroquine Hydroxychloroquine Intracellular inhibition of lysosomal enzymes  Urologic Agents Alpha Blockers Doxazosin Prazosin Tamsulosin Terazosin  Increased renin and aldosterone  Adapted from Page Carleene Overlie, et al. "Drugs That May Cause or Exacerbate Heart Failure: A Scientific Statement from the American Heart  Association." Circulation 2016;  134:e32-e69. DOI: 10.1161/CIR.0000000000000426   MEDICATION ADHERENCES TIPS AND STRATEGIES Taking medication as prescribed improves patient outcomes in heart failure (reduces hospitalizations, improves symptoms, increases survival) Side effects of medications can be managed by decreasing doses, switching agents, stopping drugs, or adding additional therapy. Please let someone in the [facility] know if you have having bothersome side effects so we can modify your regimen. Do not alter your medication regimen without talking to [place].  Medication reminders can help patients remember to take drugs on time. If you are missing or forgetting doses you can try linking behaviors, using pill boxes, or an electronic reminder like an alarm on your phone or an app. Some people can also get automated phone calls as medication reminders.

## 2021-06-18 NOTE — Telephone Encounter (Signed)
  Contacted cardiology regarding amiodarone dose (see note 06/18/21). Per cardiology, patient is to reduce amiodarone to 200 mg daily (1/2 tab of 400 mg tablets).   Attempted to call patient's sister. Purpose of call was to inform patient's caregiver (sister). No answer. Left confidential voicemail with call back number.    Elliot Gurney, PharmD Pharmacy Resident  06/18/2021 12:06 PM

## 2021-06-18 NOTE — Patient Instructions (Signed)
Continue weighing daily and call for an overnight weight gain of 3 pounds or more or a weekly weight gain of more than 5 pounds.   If you have voicemail, please make sure your mailbox is cleaned out so that we may leave a message and please make sure to listen to any voicemails.     

## 2021-09-07 ENCOUNTER — Ambulatory Visit: Payer: Medicare HMO | Attending: Family | Admitting: Family

## 2021-09-07 ENCOUNTER — Encounter: Payer: Self-pay | Admitting: Family

## 2021-09-07 VITALS — BP 110/82 | HR 76 | Resp 18 | Ht 70.0 in | Wt 169.2 lb

## 2021-09-07 DIAGNOSIS — N189 Chronic kidney disease, unspecified: Secondary | ICD-10-CM | POA: Diagnosis not present

## 2021-09-07 DIAGNOSIS — I5022 Chronic systolic (congestive) heart failure: Secondary | ICD-10-CM | POA: Diagnosis not present

## 2021-09-07 DIAGNOSIS — I480 Paroxysmal atrial fibrillation: Secondary | ICD-10-CM | POA: Insufficient documentation

## 2021-09-07 DIAGNOSIS — I2510 Atherosclerotic heart disease of native coronary artery without angina pectoris: Secondary | ICD-10-CM | POA: Diagnosis not present

## 2021-09-07 DIAGNOSIS — I130 Hypertensive heart and chronic kidney disease with heart failure and stage 1 through stage 4 chronic kidney disease, or unspecified chronic kidney disease: Secondary | ICD-10-CM | POA: Diagnosis not present

## 2021-09-07 DIAGNOSIS — I420 Dilated cardiomyopathy: Secondary | ICD-10-CM

## 2021-09-07 DIAGNOSIS — Z7901 Long term (current) use of anticoagulants: Secondary | ICD-10-CM | POA: Insufficient documentation

## 2021-09-07 DIAGNOSIS — E785 Hyperlipidemia, unspecified: Secondary | ICD-10-CM | POA: Diagnosis not present

## 2021-09-07 DIAGNOSIS — I10 Essential (primary) hypertension: Secondary | ICD-10-CM | POA: Diagnosis not present

## 2021-09-07 DIAGNOSIS — I472 Ventricular tachycardia, unspecified: Secondary | ICD-10-CM

## 2021-09-07 DIAGNOSIS — R0602 Shortness of breath: Secondary | ICD-10-CM | POA: Insufficient documentation

## 2021-09-07 NOTE — Patient Instructions (Addendum)
Continue weighing daily and call for an overnight weight gain of 3 pounds or more or a weekly weight gain of more than 5 pounds.   If you have voicemail, please make sure your mailbox is cleaned out so that we may leave a message and please make sure to listen to any voicemails.    Call us in the future if you need us for anything or to make another appointment    

## 2021-09-07 NOTE — Progress Notes (Signed)
Patient ID: Spencer Coleman, male    DOB: 11/25/1930, 88 y.o.   MRN: [id number]  HPI  Spencer Coleman is a 88 y/o male with a  history of CAD, hyperlipidemia, HTN, CKD, PAF, renal colic, patella fracture, VT with AICD and chronic heart failure.   Echo report from 06/13/21 reviewed and showed an EF of 20-25% along with moderate/severe Spencer and mild/moderate TR.   Admitted 06/12/21 due to SOB. Needed oxygen at 2L but then weaned to room air. Initially given IV lasix with transition to oral diuretics. Cardiology and surgery consults obtained. Discharged after 2 days.   He presents today for a follow-up visit with a chief complaint of minimal fatigue with moderate exertion. Describes this as chronic in nature. He has associated shortness of breath along with this. He denies any difficulty sleeping, abdominal distention, palpitations, pedal edema, chest pain, cough, dizziness or weight gain.   Continues to weigh daily and says that his weight has been stable. Says that he feels as good as he did when he was 88 years old.   Past Medical History:  Diagnosis Date   CAD (coronary artery disease)    Nonobstructive cardiac disease by cath   CHF (congestive heart failure) (HCC)    nonischemic cardiomyopathy   CKD (chronic kidney disease)    History of kidney stones    Hyperlipidemia    Hypertension    Inguinal hernia    PAF (paroxysmal atrial fibrillation) ([place])    Patella fracture Q000111Q   Renal colic [id number]   Ventricular tachycardia [facility])    s/p ICD   Past Surgical History:  Procedure Laterality Date   CARDIAC CATHETERIZATION     CARDIAC DEFIBRILLATOR PLACEMENT  2009   CYSTOSCOPY WITH LITHOLAPAXY N/A 02/21/2018   Procedure: CYSTOSCOPY WITH LITHOLAPAXY;  Surgeon: Billey Co, MD;  Location: [facility] ORS;  Service: Urology;  Laterality: N/A;   ELBOW SURGERY Left    HOLMIUM LASER APPLICATION N/A [id number]   Procedure: HOLMIUM LASER APPLICATION;  Surgeon: Billey Co, MD;  Location: [facility] ORS;   Service: Urology;  Laterality: N/A;   ICD GENERATOR CHANGEOUT N/A 01/28/2021   Procedure: ICD GENERATOR CHANGEOUT;  Surgeon: Isaias Cowman, MD;  Location: [place] [facility];  Service: Cardiovascular;  Laterality: N/A;   INGUINAL HERNIA REPAIR     LITHOTRIPSY     ORIF left wrist fracture Left    ORIF PATELLA Left 01/10/2015   Procedure: OPEN REDUCTION INTERNAL (ORIF) FIXATION PATELLA;  Surgeon: Dereck Leep, MD;  Location: [facility] ORS;  Service: Orthopedics;  Laterality: Left;   ORIF PATELLA Left 02/06/2015   Procedure: OPEN REDUCTION INTERNAL (ORIF) FIXATION PATELLA;  Surgeon: Dereck Leep, MD;  Location: [facility] OR[facility];  Service: Orthopedics;  Laterality: Left;   TRANSURETHRAL RESECTION OF PROSTATE N/A 02/21/2018   Procedure: TRANSURETHRAL RESECTION OF THE PROSTATE (TURP);  Surgeon: Billey Co, MD;  Location: [facility] OR[facility];  Service: Urology;  Laterality: N/A;   Family History  Problem Relation Age of Onset   CAD Mother    Kidney cancer Father    Prostate cancer Neg Hx    Bladder Cancer Neg Hx    Social History   Tobacco Use   Smoking status: Never   Smokeless tobacco: Never  Substance Use Topics   Alcohol use: No    Alcohol/week: 0.0 standard drinks of alcohol   No Known Allergies  Prior to Admission medications   Medication Sig Start Date End Date Taking? Authorizing Provider  acetaminophen (TYLENOL)  500 MG tablet Take 2 tablets (1,000 mg total) by mouth every 6 (six) hours. 02/22/18  Yes Sondra Come, MD  amiodarone (PACERONE) 400 MG tablet Take 400 mg by mouth daily.   Yes [provider]  apixaban (ELIQUIS) 2.5 MG TABS tablet Take 1 tablet (2.5 mg total) by mouth 2 (two) times daily. 06/14/21  Yes Wouk, Wilfred Curtis, MD  furosemide (LASIX) 20 MG tablet Take 20 mg by mouth daily. 11/09/20  Yes [provider]  metoprolol succinate (TOPROL-XL) 25 MG 24 hr tablet Take 25 mg by mouth daily. 03/17/21  Yes [provider]  simvastatin (ZOCOR) 40 MG  tablet Take 40 mg by mouth at bedtime. 11/09/20  Yes [provider]    Review of Systems  Constitutional:  Positive for fatigue. Negative for appetite change.  HENT:  Negative for congestion, postnasal drip and sore throat.   Eyes: Negative.   Respiratory:  Positive for shortness of breath (very little). Negative for cough and chest tightness.   Cardiovascular:  Negative for chest pain, palpitations and leg swelling.  Gastrointestinal:  Negative for abdominal distention and abdominal pain.  Endocrine: Negative.   Genitourinary: Negative.   Musculoskeletal:  Negative for back pain and neck pain.  Skin: Negative.   Allergic/Immunologic: Negative.   Neurological:  Negative for dizziness and light-headedness.  Hematological:  Negative for adenopathy. Does not bruise/bleed easily.  Psychiatric/Behavioral:  Negative for dysphoric mood and sleep disturbance (sleeping on 1 pillow). The patient is not nervous/anxious.    Vitals:   09/07/21 1451  BP: 110/82  Pulse: 76  Resp: 18  SpO2: 97%  Weight: 169 lb 4 oz (76.8 kg)  Height: 5\' 10"  (1.778 m)   Wt Readings from Last 3 Encounters:  09/07/21 169 lb 4 oz (76.8 kg)  06/18/21 166 lb 2 oz (75.4 kg)  06/14/21 166 lb 1.6 oz (75.3 kg)   Lab Results  Component Value Date   CREATININE 1.75 (H) 06/14/2021   CREATININE 1.52 (H) 06/13/2021   CREATININE 1.99 (H) 06/12/2021    Physical Exam Vitals and nursing note reviewed.  Constitutional:      Appearance: Normal appearance.  HENT:     Head: Normocephalic and atraumatic.  Cardiovascular:     Rate and Rhythm: Normal rate and regular rhythm.  Pulmonary:     Effort: Pulmonary effort is normal. No respiratory distress.     Breath sounds: No wheezing or rales.  Abdominal:     General: There is no distension.     Palpations: Abdomen is soft.     Tenderness: There is no abdominal tenderness.  Musculoskeletal:        General: No tenderness.     Cervical back: Normal range of  motion and neck supple.     Right lower leg: No edema.     Left lower leg: No edema.  Skin:    General: Skin is warm and dry.  Neurological:     General: No focal deficit present.     Mental Status: He is alert and oriented to person, place, and time.  Psychiatric:        Mood and Affect: Mood normal.        Behavior: Behavior normal.        Thought Content: Thought content normal.  Assessment & Plan:  1: Chronic heart failure with reduced ejection fraction- - NYHA class II - euvolemic today - weighing daily; reminded to call for an overnight weight gain of > 2 pounds  or a weekly weight gain of > 5 pounds - weight up 3 pounds from last visit here 2.5 months ago - adds "very little" salt to his food and he was encouraged to not add any - on GDMT of metoprolol - unsure if BP could tolerate other GDMT - BNP 06/14/21 was 1331.9  2: HTN with CKD- - BP looks good today (110/82) - saw PCP (Hande) 08/09/21 - BMP 06/14/21 reviewed and showed sodium 138, potassium 3.9, creatinine 1.75 and GFR 37  3: VT- - has AICD; denies any shocking - saw cardiology (Paraschos) 06/24/21   Patient did not bring his medications nor a list. Each medication was verbally reviewed with the patient and he was encouraged to bring the bottles to every visit to confirm accuracy of list.   Due to HF stability, will not make a return appointment at this time. Advised patient that he could call back at anytime for questions or to make another appointment and he was comfortable with this place.

## 2022-01-10 ENCOUNTER — Other Ambulatory Visit: Payer: Self-pay

## 2022-01-10 ENCOUNTER — Emergency Department: Payer: Medicare HMO

## 2022-01-10 ENCOUNTER — Emergency Department
Admission: EM | Admit: 2022-01-10 | Discharge: 2022-01-10 | Disposition: A | Discharge status: Home / Self Care | Payer: Medicare HMO | Attending: Emergency Medicine | Admitting: Emergency Medicine

## 2022-01-10 DIAGNOSIS — R079 Chest pain, unspecified: Secondary | ICD-10-CM

## 2022-01-10 DIAGNOSIS — R0602 Shortness of breath: Secondary | ICD-10-CM | POA: Insufficient documentation

## 2022-01-10 LAB — CBC
HCT: 41 % (ref 39.0–52.0)
Hemoglobin: 13.3 g/dL (ref 13.0–17.0)
MCH: 30.4 pg (ref 26.0–34.0)
MCHC: 32.4 g/dL (ref 30.0–36.0)
MCV: 93.6 fL (ref 80.0–100.0)
Platelets: 144 10*3/uL — ABNORMAL LOW (ref 150–400)
RBC: 4.38 MIL/uL (ref 4.22–5.81)
RDW: 13.7 % (ref 11.5–15.5)
WBC: 6.7 10*3/uL (ref 4.0–10.5)
nRBC: 0 % (ref 0.0–0.2)

## 2022-01-10 LAB — BASIC METABOLIC PANEL
Anion gap: 7 (ref 5–15)
BUN: 23 mg/dL (ref 8–23)
CO2: 27 mmol/L (ref 22–32)
Calcium: 9 mg/dL (ref 8.9–10.3)
Chloride: 106 mmol/L (ref 98–111)
Creatinine, Ser: 1.83 mg/dL — ABNORMAL HIGH (ref 0.61–1.24)
GFR, Estimated: 35 mL/min — ABNORMAL LOW (ref >60)
Glucose, Bld: 87 mg/dL (ref 70–99)
Potassium: 3.5 mmol/L (ref 3.5–5.1)
Sodium: 140 mmol/L (ref 135–145)

## 2022-01-10 LAB — TROPONIN I (HIGH SENSITIVITY)
Troponin I (High Sensitivity): 218 ng/L (ref <18)
Troponin I (High Sensitivity): 203 ng/L (ref <18)

## 2022-01-10 NOTE — ED Provider Notes (Signed)
[facility] Provider Note    Event Date/Time   First MD Initiated Contact with Patient 01/10/22 1211     (approximate)   History   Chest Pain   HPI  Spencer Coleman is a 89 y.o. male  who presents to the emergency department today because of concern for episode of chest pain and shortness of breath that occurred two nights ago. He was getting ready for bed when he developed chest pain. Located in the center of his chest. Was accompanied by shortness of breath. Lasted about thirty minutes. Could not find his phone to call 911 at that time. The next day told his sister but refused evaluation. Today his sister convinced him to be evaluated. The patient states that he has not had any further episodes of chest pain or shortness of breath.      Physical Exam   Triage Vital Signs: ED Triage Vitals  Enc Vitals Group     BP 01/10/22 1053 123/81     Pulse Rate 01/10/22 1053 73     Resp --      Temp 01/10/22 1053 97.9 F (36.6 C)     Temp Source 01/10/22 1053 Oral     SpO2 01/10/22 1053 99 %     Weight 01/10/22 1052 155 lb (70.3 kg)     Height 01/10/22 1052 5\' 10"  (1.778 m)     Head Circumference --      Peak Flow --      Pain Score 01/10/22 1052 0     Pain Loc --      Pain Edu? --      Excl. in [place]? --     Most recent vital signs: Vitals:   01/10/22 1053  BP: 123/81  Pulse: 73  Temp: 97.9 F (36.6 C)  SpO2: 99%   General: Awake, alert, oriented. CV:  Good peripheral perfusion. Regular rate and rhythm. Resp:  Normal effort. Lungs clear. Abd:  No distention.    ED Results / Procedures / Treatments   Labs (all labs ordered are listed, but only abnormal results are displayed) Labs Reviewed  BASIC METABOLIC PANEL - Abnormal; Notable for the following components:      Result Value   Creatinine, Ser 1.83 (*)    GFR, Estimated 35 (*)    All other components within normal limits  CBC - Abnormal; Notable for the following components:   Platelets  144 (*)    All other components within normal limits  TROPONIN I (HIGH SENSITIVITY) - Abnormal; Notable for the following components:   Troponin I (High Sensitivity) 218 (*)    All other components within normal limits     EKG  I, Nance Pear, attending physician, personally viewed and interpreted this EKG  EKG Time: 1055 Rate: 73 Rhythm: av dual paced rhythm Axis: rightward axis Intervals: qtc 555 QRS: wide ST changes: no st elevation Impression: abnormal ekg   RADIOLOGY I independently interpreted and visualized the CXR. My interpretation: No pneumonia. Radiology interpretation:  IMPRESSION:  1. No acute abnormality.  2. Chronic asymmetric elevation of the right hemidiaphragm with  overlying atelectasis.  3.  Aortic Atherosclerosis (ICD10-I70.0).      PROCEDURES:  Critical Care performed: No  Procedures   MEDICATIONS ORDERED IN ED: Medications - No data to display   IMPRESSION / MDM / [place] / ED COURSE  I reviewed the triage vital signs and the nursing notes.  Differential diagnosis includes, but is not limited to, acs, pneumonia, pneumothorax, pleural effusion, gerd.  Patient's presentation is most consistent with acute presentation with potential threat to life or bodily function.  Patient presented to the emergency department today because of an episode of chest pain that occurred two nights ago. Patient pain free at the time of my exam. EKG without ST elevation. Initial troponin was elevated, repeat troponin with some improvement. Discussed with patient's cardiologist Dr. Saralyn Pilar. At this time he felt it was reasonable for patient to be discharged home versus observation. Discussed with patient who stated he would like to go home and plan on following up. Did discuss strict return precautions with the patient.      FINAL CLINICAL IMPRESSION(S) / ED DIAGNOSES   Final diagnoses:  Chest pain, unspecified  type     Note:  This document was prepared using Dragon voice recognition software and may include unintentional dictation errors.    Nance Pear, MD 01/11/22 [phone]

## 2022-01-10 NOTE — ED Triage Notes (Signed)
Pt reports Saturday felt some pressure in his chest while he was walking. Pt reports pain was pressure like and mostly on the right side. Pt reports he laid down and it got worse and he had trouble breathing. Pt reports it felt very hard for him to be able to breathe. Pt has pacemaker in and hx of heart issues and last saw his cardiologist 2 weeks prior. Pt denies pain or SOB at this time.

## 2022-01-10 NOTE — Discharge Instructions (Signed)
Please seek medical attention for any high fevers, chest pain, shortness of breath, change in behavior, persistent vomiting, bloody stool or any other new or concerning symptoms.  

## 2022-04-12 ENCOUNTER — Other Ambulatory Visit: Payer: Self-pay | Admitting: Physician Assistant

## 2022-04-12 ENCOUNTER — Ambulatory Visit
Admission: RE | Admit: 2022-04-12 | Discharge: 2022-04-12 | Disposition: A | Discharge status: Home / Self Care | Payer: Medicare HMO | Source: Ambulatory Visit | Attending: Physician Assistant | Admitting: Physician Assistant

## 2022-04-12 DIAGNOSIS — M25551 Pain in right hip: Secondary | ICD-10-CM

## 2022-05-12 ENCOUNTER — Encounter: Payer: Self-pay | Admitting: Emergency Medicine

## 2022-05-12 ENCOUNTER — Other Ambulatory Visit: Payer: Self-pay

## 2022-05-12 ENCOUNTER — Inpatient Hospital Stay
Admission: EM | Admit: 2022-05-12 | Discharge: 2022-05-31 | DRG: 189 | Disposition: A | Discharge status: Home / Self Care | Payer: Medicare HMO | Attending: Internal Medicine | Admitting: Internal Medicine

## 2022-05-12 ENCOUNTER — Emergency Department: Payer: Medicare HMO

## 2022-05-12 DIAGNOSIS — N183 Chronic kidney disease, stage 3 unspecified: Secondary | ICD-10-CM | POA: Diagnosis present

## 2022-05-12 DIAGNOSIS — G9340 Encephalopathy, unspecified: Secondary | ICD-10-CM | POA: Diagnosis not present

## 2022-05-12 DIAGNOSIS — I959 Hypotension, unspecified: Secondary | ICD-10-CM | POA: Diagnosis not present

## 2022-05-12 DIAGNOSIS — I420 Dilated cardiomyopathy: Secondary | ICD-10-CM | POA: Diagnosis present

## 2022-05-12 DIAGNOSIS — I2510 Atherosclerotic heart disease of native coronary artery without angina pectoris: Secondary | ICD-10-CM | POA: Diagnosis present

## 2022-05-12 DIAGNOSIS — I5043 Acute on chronic combined systolic (congestive) and diastolic (congestive) heart failure: Secondary | ICD-10-CM | POA: Diagnosis present

## 2022-05-12 DIAGNOSIS — I4891 Unspecified atrial fibrillation: Secondary | ICD-10-CM | POA: Diagnosis not present

## 2022-05-12 DIAGNOSIS — Z1152 Encounter for screening for COVID-19: Secondary | ICD-10-CM | POA: Diagnosis not present

## 2022-05-12 DIAGNOSIS — I480 Paroxysmal atrial fibrillation: Secondary | ICD-10-CM | POA: Diagnosis present

## 2022-05-12 DIAGNOSIS — I5023 Acute on chronic systolic (congestive) heart failure: Secondary | ICD-10-CM | POA: Diagnosis present

## 2022-05-12 DIAGNOSIS — Z8249 Family history of ischemic heart disease and other diseases of the circulatory system: Secondary | ICD-10-CM

## 2022-05-12 DIAGNOSIS — N390 Urinary tract infection, site not specified: Secondary | ICD-10-CM | POA: Diagnosis present

## 2022-05-12 DIAGNOSIS — Z87442 Personal history of urinary calculi: Secondary | ICD-10-CM

## 2022-05-12 DIAGNOSIS — I9589 Other hypotension: Secondary | ICD-10-CM | POA: Diagnosis present

## 2022-05-12 DIAGNOSIS — F03911 Unspecified dementia, unspecified severity, with agitation: Secondary | ICD-10-CM | POA: Diagnosis present

## 2022-05-12 DIAGNOSIS — I25119 Atherosclerotic heart disease of native coronary artery with unspecified angina pectoris: Secondary | ICD-10-CM | POA: Diagnosis not present

## 2022-05-12 DIAGNOSIS — I4510 Unspecified right bundle-branch block: Secondary | ICD-10-CM | POA: Diagnosis present

## 2022-05-12 DIAGNOSIS — N1831 Chronic kidney disease, stage 3a: Secondary | ICD-10-CM | POA: Diagnosis present

## 2022-05-12 DIAGNOSIS — I509 Heart failure, unspecified: Principal | ICD-10-CM

## 2022-05-12 DIAGNOSIS — I493 Ventricular premature depolarization: Secondary | ICD-10-CM | POA: Diagnosis not present

## 2022-05-12 DIAGNOSIS — B9620 Unspecified Escherichia coli [E. coli] as the cause of diseases classified elsewhere: Secondary | ICD-10-CM | POA: Diagnosis present

## 2022-05-12 DIAGNOSIS — R55 Syncope and collapse: Secondary | ICD-10-CM | POA: Diagnosis not present

## 2022-05-12 DIAGNOSIS — Z7901 Long term (current) use of anticoagulants: Secondary | ICD-10-CM | POA: Diagnosis not present

## 2022-05-12 DIAGNOSIS — Z597 Insufficient social insurance and welfare support: Secondary | ICD-10-CM | POA: Diagnosis not present

## 2022-05-12 DIAGNOSIS — Z8679 Personal history of other diseases of the circulatory system: Secondary | ICD-10-CM | POA: Diagnosis not present

## 2022-05-12 DIAGNOSIS — R000 Tachycardia, unspecified: Secondary | ICD-10-CM | POA: Diagnosis present

## 2022-05-12 DIAGNOSIS — Z515 Encounter for palliative care: Secondary | ICD-10-CM

## 2022-05-12 DIAGNOSIS — D696 Thrombocytopenia, unspecified: Secondary | ICD-10-CM | POA: Diagnosis present

## 2022-05-12 DIAGNOSIS — I130 Hypertensive heart and chronic kidney disease with heart failure and stage 1 through stage 4 chronic kidney disease, or unspecified chronic kidney disease: Secondary | ICD-10-CM | POA: Diagnosis present

## 2022-05-12 DIAGNOSIS — Z9079 Acquired absence of other genital organ(s): Secondary | ICD-10-CM

## 2022-05-12 DIAGNOSIS — I472 Ventricular tachycardia, unspecified: Secondary | ICD-10-CM | POA: Diagnosis not present

## 2022-05-12 DIAGNOSIS — E785 Hyperlipidemia, unspecified: Secondary | ICD-10-CM | POA: Diagnosis present

## 2022-05-12 DIAGNOSIS — Z66 Do not resuscitate: Secondary | ICD-10-CM | POA: Diagnosis present

## 2022-05-12 DIAGNOSIS — J9601 Acute respiratory failure with hypoxia: Principal | ICD-10-CM | POA: Diagnosis present

## 2022-05-12 DIAGNOSIS — Z7189 Other specified counseling: Secondary | ICD-10-CM | POA: Diagnosis not present

## 2022-05-12 DIAGNOSIS — R531 Weakness: Secondary | ICD-10-CM | POA: Diagnosis present

## 2022-05-12 DIAGNOSIS — Z95810 Presence of automatic (implantable) cardiac defibrillator: Secondary | ICD-10-CM | POA: Diagnosis present

## 2022-05-12 DIAGNOSIS — Z9181 History of falling: Secondary | ICD-10-CM

## 2022-05-12 DIAGNOSIS — Z79899 Other long term (current) drug therapy: Secondary | ICD-10-CM | POA: Diagnosis not present

## 2022-05-12 DIAGNOSIS — R413 Other amnesia: Secondary | ICD-10-CM | POA: Diagnosis not present

## 2022-05-12 LAB — URINALYSIS, ROUTINE W REFLEX MICROSCOPIC
Bilirubin Urine: NEGATIVE
Glucose, UA: NEGATIVE mg/dL
Ketones, ur: NEGATIVE mg/dL
Leukocytes,Ua: NEGATIVE
Nitrite: NEGATIVE
Protein, ur: 30 mg/dL — AB
RBC / HPF: >50 RBC/hpf (ref 0–5)
Specific Gravity, Urine: 1.01 (ref 1.005–1.030)
Squamous Epithelial / HPF: NONE SEEN /HPF (ref 0–5)
pH: 5 (ref 5.0–8.0)

## 2022-05-12 LAB — COMPREHENSIVE METABOLIC PANEL
ALT: 16 U/L (ref 0–44)
AST: 28 U/L (ref 15–41)
Albumin: 3.4 g/dL — ABNORMAL LOW (ref 3.5–5.0)
Alkaline Phosphatase: 89 U/L (ref 38–126)
Anion gap: 12 (ref 5–15)
BUN: 26 mg/dL — ABNORMAL HIGH (ref 8–23)
CO2: 25 mmol/L (ref 22–32)
Calcium: 8.9 mg/dL (ref 8.9–10.3)
Chloride: 103 mmol/L (ref 98–111)
Creatinine, Ser: 1.73 mg/dL — ABNORMAL HIGH (ref 0.61–1.24)
GFR, Estimated: 37 mL/min — ABNORMAL LOW (ref >60)
Glucose, Bld: 135 mg/dL — ABNORMAL HIGH (ref 70–99)
Potassium: 3.8 mmol/L (ref 3.5–5.1)
Sodium: 140 mmol/L (ref 135–145)
Total Bilirubin: 2.2 mg/dL — ABNORMAL HIGH (ref 0.3–1.2)
Total Protein: 5.7 g/dL — ABNORMAL LOW (ref 6.5–8.1)

## 2022-05-12 LAB — CBC WITH DIFFERENTIAL/PLATELET
Abs Immature Granulocytes: 0.03 10*3/uL (ref 0.00–0.07)
Basophils Absolute: 0 10*3/uL (ref 0.0–0.1)
Basophils Relative: 1 %
Eosinophils Absolute: 0.1 10*3/uL (ref 0.0–0.5)
Eosinophils Relative: 1 %
HCT: 42.7 % (ref 39.0–52.0)
Hemoglobin: 13.2 g/dL (ref 13.0–17.0)
Immature Granulocytes: 1 %
Lymphocytes Relative: 27 %
Lymphs Abs: 1.6 10*3/uL (ref 0.7–4.0)
MCH: 29.7 pg (ref 26.0–34.0)
MCHC: 30.9 g/dL (ref 30.0–36.0)
MCV: 96 fL (ref 80.0–100.0)
Monocytes Absolute: 0.5 10*3/uL (ref 0.1–1.0)
Monocytes Relative: 9 %
Neutro Abs: 3.6 10*3/uL (ref 1.7–7.7)
Neutrophils Relative %: 61 %
Platelets: 165 10*3/uL (ref 150–400)
RBC: 4.45 MIL/uL (ref 4.22–5.81)
RDW: 16.2 % — ABNORMAL HIGH (ref 11.5–15.5)
WBC: 5.9 10*3/uL (ref 4.0–10.5)
nRBC: 0 % (ref 0.0–0.2)

## 2022-05-12 LAB — MRSA NEXT GEN BY PCR, NASAL: MRSA by PCR Next Gen: NOT DETECTED

## 2022-05-12 LAB — TROPONIN I (HIGH SENSITIVITY)
Troponin I (High Sensitivity): 48 ng/L — ABNORMAL HIGH (ref <18)
Troponin I (High Sensitivity): 73 ng/L — ABNORMAL HIGH (ref <18)

## 2022-05-12 LAB — SARS CORONAVIRUS 2 BY RT PCR: SARS Coronavirus 2 by RT PCR: NEGATIVE

## 2022-05-12 LAB — MAGNESIUM: Magnesium: 2.2 mg/dL (ref 1.7–2.4)

## 2022-05-12 LAB — PHOSPHORUS: Phosphorus: 4.2 mg/dL (ref 2.5–4.6)

## 2022-05-12 LAB — PROCALCITONIN: Procalcitonin: <0.1 ng/mL

## 2022-05-12 LAB — LACTIC ACID, PLASMA: Lactic Acid, Venous: 2.9 mmol/L (ref 0.5–1.9)

## 2022-05-12 LAB — GLUCOSE, CAPILLARY: Glucose-Capillary: 135 mg/dL — ABNORMAL HIGH (ref 70–99)

## 2022-05-12 LAB — BRAIN NATRIURETIC PEPTIDE: B Natriuretic Peptide: 3155.4 pg/mL — ABNORMAL HIGH (ref 0.0–100.0)

## 2022-05-12 MED ORDER — ACETAMINOPHEN 325 MG PO TABS
650.0000 mg | ORAL_TABLET | ORAL | Status: DC | PRN
  Administered 2022-05-19 – 2022-05-30 (×4): 650 mg via ORAL
  Filled 2022-05-12 (×4): qty 2

## 2022-05-12 MED ORDER — CHLORHEXIDINE GLUCONATE CLOTH 2 % EX PADS
6.0000 | MEDICATED_PAD | Freq: Every day | CUTANEOUS | Status: DC
  Administered 2022-05-12 – 2022-05-13 (×2): 6 via TOPICAL

## 2022-05-12 MED ORDER — NOREPINEPHRINE 4 MG/250ML-% IV SOLN
2.0000 ug/min | INTRAVENOUS | Status: DC
  Filled 2022-05-12: qty 250

## 2022-05-12 MED ORDER — SODIUM CHLORIDE 0.9 % IV SOLN: 250.0000 mL | INTRAVENOUS | Status: DC

## 2022-05-12 MED ORDER — APIXABAN 2.5 MG PO TABS
2.5000 mg | ORAL_TABLET | Freq: Two times a day (BID) | ORAL | Status: DC
  Administered 2022-05-12 – 2022-05-31 (×37): 2.5 mg via ORAL
  Filled 2022-05-12 (×38): qty 1

## 2022-05-12 MED ORDER — AMIODARONE LOAD VIA INFUSION
150.0000 mg | Freq: Once | INTRAVENOUS | Status: AC
  Administered 2022-05-12: 150 mg via INTRAVENOUS
  Filled 2022-05-12: qty 83.34

## 2022-05-12 MED ORDER — AMIODARONE HCL IN DEXTROSE 360-4.14 MG/200ML-% IV SOLN
60.0000 mg/h | INTRAVENOUS | Status: AC
  Administered 2022-05-12 (×2): 60 mg/h via INTRAVENOUS
  Filled 2022-05-12 (×3): qty 200

## 2022-05-12 MED ORDER — AMIODARONE IV BOLUS ONLY 150 MG/100ML
150.0000 mg | Freq: Once | INTRAVENOUS | Status: AC
  Administered 2022-05-12: 150 mg via INTRAVENOUS

## 2022-05-12 MED ORDER — FUROSEMIDE 10 MG/ML IJ SOLN
80.0000 mg | Freq: Once | INTRAMUSCULAR | Status: AC
  Administered 2022-05-12: 80 mg via INTRAVENOUS
  Filled 2022-05-12: qty 8

## 2022-05-12 MED ORDER — POLYETHYLENE GLYCOL 3350 17 G PO PACK: 17.0000 g | PACK | Freq: Every day | ORAL | Status: DC | PRN

## 2022-05-12 MED ORDER — ADENOSINE 12 MG/4ML IV SOLN
12.0000 mg | Freq: Once | INTRAVENOUS | Status: AC
  Administered 2022-05-12: 12 mg via INTRAVENOUS
  Filled 2022-05-12: qty 4

## 2022-05-12 MED ORDER — AMIODARONE HCL IN DEXTROSE 360-4.14 MG/200ML-% IV SOLN: 30.0000 mg/h | INTRAVENOUS | Status: DC

## 2022-05-12 MED ORDER — DOCUSATE SODIUM 100 MG PO CAPS: 100.0000 mg | ORAL_CAPSULE | Freq: Two times a day (BID) | ORAL | Status: DC | PRN

## 2022-05-12 MED ORDER — AMIODARONE HCL IN DEXTROSE 360-4.14 MG/200ML-% IV SOLN
30.0000 mg/h | INTRAVENOUS | Status: AC
  Administered 2022-05-12: 60 mg/h via INTRAVENOUS
  Filled 2022-05-12: qty 200

## 2022-05-12 NOTE — ED Provider Notes (Signed)
[facility] Provider Note    Event Date/Time   First MD Initiated Contact with Patient 05/12/22 1325     (approximate)   History   Shortness of Breath   HPI  Spencer Coleman is a 89 y.o. male with history of dilated cardiomyopathy who comes in with concerns for shortness of breath.  Patient reports that he has a pacemaker, ICD.  He reports at 2 AM that he started feeling short of breath.  He reports has had resolution of shortness of breath since then.  Is unclear why he waited so long to come in.  He states that he is not having any symptoms at this time.  Patient noted to be in a wide-complex tachycardia but he states that he has not been feeling his pacemaker defibrillator go off. Denies any falls, hitting his head.     Family is now at bedside he did report that patient recently diagnosed with UTI but has not been started on any medications just had blood work done yesterday.  Physical Exam   Triage Vital Signs: ED Triage Vitals  Enc Vitals Group     BP 05/12/22 1333 105/79     Pulse Rate 05/12/22 1332 (!) 49     Resp 05/12/22 1332 13     Temp 05/12/22 1333 97.6 F (36.4 C)     Temp src --      SpO2 --      Weight 05/12/22 1332 145 lb (65.8 kg)     Height 05/12/22 1332 5\' 10"  (1.778 m)     Head Circumference --      Peak Flow --      Pain Score 05/12/22 1332 0     Pain Loc --      Pain Edu? --      Excl. in GC? --     Most recent vital signs: Vitals:   05/12/22 1332 05/12/22 1333  BP:  105/79  Pulse: (!) 49   Resp: 13   Temp:  97.6 F (36.4 C)     General: Awake, no distress.  CV:  Good peripheral perfusion.  Wide-complex tachycardia Resp:  Normal effort.  Abd:  No distention.  Other:  Swelling in the right leg worse than the right but edema noted bilaterally..  Patient reports this is baseline from her prior surgery.   ED Results / Procedures / Treatments   Labs (all labs ordered are listed, but only abnormal results are  displayed) Labs Reviewed  CBC WITH DIFFERENTIAL/PLATELET - Abnormal; Notable for the following components:      Result Value   RDW 16.2 (*)    All other components within normal limits  BRAIN NATRIURETIC PEPTIDE - Abnormal; Notable for the following components:   B Natriuretic Peptide 3,155.4 (*)    All other components within normal limits  TROPONIN I (HIGH SENSITIVITY) - Abnormal; Notable for the following components:   Troponin I (High Sensitivity) 48 (*)    All other components within normal limits  URINE CULTURE  URINALYSIS, ROUTINE W REFLEX MICROSCOPIC  COMPREHENSIVE METABOLIC PANEL  MAGNESIUM  PHOSPHORUS  TROPONIN I (HIGH SENSITIVITY)     EKG  My interpretation of EKG:  Wide-complex tachycardia rate of 141 without any ST elevation, some T wave inversions in V3, [place] complex noted  RADIOLOGY I have reviewed the xray personally and interpreted and there is some developing vascular congestion with tiny effusion  PROCEDURES:  Critical Care performed: Yes, see critical care procedure  note(s)  .1-3 Lead EKG Interpretation  Performed by: Concha Se, MD Authorized by: Concha Se, MD     Interpretation: abnormal     ECG rate:  140   ECG rate assessment: tachycardic     Rhythm comment:  Wide complex tachycardia   Ectopy: none     Conduction: normal   .Critical Care  Performed by: Concha Se, MD Authorized by: Concha Se, MD   Critical care provider statement:    Critical care time (minutes):  30   Critical care was necessary to treat or prevent imminent or life-threatening deterioration of the following conditions:  Cardiac failure   Critical care was time spent personally by me on the following activities:  Development of treatment plan with patient or surrogate, discussions with consultants, evaluation of patient's response to treatment, examination of patient, ordering and review of laboratory studies, ordering and review of radiographic studies,  ordering and performing treatments and interventions, pulse oximetry, re-evaluation of patient's condition and review of old charts    MEDICATIONS ORDERED IN ED: Medications  amiodarone (NEXTERONE) 1.8 mg/mL load via infusion 150 mg (has no administration in time range)    Followed by  amiodarone (NEXTERONE PREMIX) 360-4.14 MG/200ML-% (1.8 mg/mL) IV infusion (has no administration in time range)    Followed by  amiodarone (NEXTERONE PREMIX) 360-4.14 MG/200ML-% (1.8 mg/mL) IV infusion (has no administration in time range)  furosemide (LASIX) injection 80 mg (has no administration in time range)  0.9 %  sodium chloride infusion (has no administration in time range)  norepinephrine (LEVOPHED) 4mg  in (0.016 mg/mL) premix infusion (has no administration in time range)  adenosine (ADENOCARD) 12 MG/4ML injection 12 mg (12 mg Intravenous Given 05/12/22 1457)     IMPRESSION / MDM / ASSESSMENT AND PLAN / ED COURSE  I reviewed the triage vital signs and the nursing notes.   Patient's presentation is most consistent with acute presentation with potential threat to life or bodily function.   She comes in asymptomatic with heart rates in the 140s with known ICD, pacemaker with neither that are firing or attempting overdrive pacing.  On review of the records from cardiology on 01/17/2022 it does appear that patient has a history of A-fib, is on Eliquis 2.5 twice daily, amiodarone.  CBC is reassuring.  Troponin is slightly elevated but down from previous.  BNP significantly elevated he does look fluid overloaded on examination.  However with his low blood pressures it be hard to diurese him.   2:43 PM discussed with [employer] who stated that the device only picks up if it is greater than 150 which is why that is not being detected.  Does appear fluid overloaded.   Discussed with Dr Callwood-recommend adenosine 12mg  to see if underlying aFlut.  3:20 PM unfortunately with the adenosine there is no  changes in patient's heart rate.  Patient remains asymptomatic with borderline blood pressures.  I discussed the case again with Dr. Juliann Pares who did recommend amiodarone bolus, infusion as well as trying to diurese patient.  Discussed with him the low blood pressure concern and he stated that we could use some peripheral Levophed and I will discuss to the hospital team for admission given his low blood pressures requiring diuresis.  Will discuss with the ICU for admission  The patient is on the cardiac monitor to evaluate for evidence of arrhythmia and/or significant heart rate changes.      FINAL CLINICAL IMPRESSION(S) / ED DIAGNOSES   Final  diagnoses:  Acute on chronic congestive heart failure, unspecified heart failure type (HCC)  Wide-complex tachycardia  Hypotension, unspecified hypotension type     Rx / DC Orders   ED Discharge Orders     None        Note:  This document was prepared using Dragon voice recognition software and may include unintentional dictation errors.   Concha Se, MD 05/12/22 1540

## 2022-05-12 NOTE — ED Triage Notes (Signed)
Pt to ED via [facility] from home for shortness of breath episode that occurred this morning around 0200. Pt states that he did not call EMS because he did not want to wake them. Pt denies any new swelling. Pt does not have any complaints at this time. Pt does have a pace maker/Defib but states that he has not felt it kick in. Pt is A & O on arrival, pt is tachycardic in the 140's. Pt denies pain at this time.

## 2022-05-12 NOTE — Progress Notes (Signed)
Dr. Belia Heman notified of patient lactic acid of 2.9.

## 2022-05-12 NOTE — Consult Note (Signed)
CARDIOLOGY CONSULT NOTE               Patient ID: Spencer Coleman MRN: [id number] DOB/AGE: Sep 01, 1930 89 y.o.  Admit date: 05/12/2022 Referring Physician Dr. Alfred Levins ER Primary Physician Dr. Marcello Fennel primary Primary Cardiologist Dr. Darrold Junker cardiologist Reason for Consultation wide-complex tachycardia hypotension acute congestive heart failure  HPI: 89 year old white male history of multiple medical problems acute on chronic systolic congestive heart failure dilated nonischemic cardiomyopathy grade 3 chronic renal insufficiency hypotension ventricular tachycardia with AICD in place hyperlipidemia paroxysmal atrial fibrillation presented with not feeling well complains of tachycardia weakness and fatigue was found to have heart rate of around 140 with wide-complex.  Patient did not have any discharges from his AICD denies any chest pain no worsening shortness of breath just complains of weakness fatigue  Review of systems complete and found to be negative unless listed above     Past Medical History:  Diagnosis Date   CAD (coronary artery disease)    Nonobstructive cardiac disease by cath   CHF (congestive heart failure) (HCC)    nonischemic cardiomyopathy   CKD (chronic kidney disease)    History of kidney stones    Hyperlipidemia    Hypertension    Inguinal hernia    PAF (paroxysmal atrial fibrillation) (HCC)    Patella fracture 01/09/2015   Renal colic 10/07/2011   Ventricular tachycardia [facility])    s/p ICD    Past Surgical History:  Procedure Laterality Date   CARDIAC CATHETERIZATION     CARDIAC DEFIBRILLATOR PLACEMENT  2009   CYSTOSCOPY WITH LITHOLAPAXY N/A 02/21/2018   Procedure: CYSTOSCOPY WITH LITHOLAPAXY;  Surgeon: Sondra Come, MD;  Location: [facility] ORS;  Service: Urology;  Laterality: N/A;   ELBOW SURGERY Left    HOLMIUM LASER APPLICATION N/A 02/21/2018   Procedure: HOLMIUM LASER APPLICATION;  Surgeon: Sondra Come, MD;  Location: [facility] ORS;  Service: Urology;   Laterality: N/A;   ICD GENERATOR CHANGEOUT N/A 01/28/2021   Procedure: ICD GENERATOR CHANGEOUT;  Surgeon: Marcina Millard, MD;  Location: [facility] INVASIVE CV LAB;  Service: Cardiovascular;  Laterality: N/A;   INGUINAL HERNIA REPAIR     LITHOTRIPSY     ORIF left wrist fracture Left    ORIF PATELLA Left 01/10/2015   Procedure: OPEN REDUCTION INTERNAL (ORIF) FIXATION PATELLA;  Surgeon: Donato Heinz, MD;  Location: [facility] ORS;  Service: Orthopedics;  Laterality: Left;   ORIF PATELLA Left 02/06/2015   Procedure: OPEN REDUCTION INTERNAL (ORIF) FIXATION PATELLA;  Surgeon: Donato Heinz, MD;  Location: [facility] ORS;  Service: Orthopedics;  Laterality: Left;   TRANSURETHRAL RESECTION OF PROSTATE N/A 02/21/2018   Procedure: TRANSURETHRAL RESECTION OF THE PROSTATE (TURP);  Surgeon: Sondra Come, MD;  Location: [facility] ORS;  Service: Urology;  Laterality: N/A;    (Not in a hospital admission)  Social History   Socioeconomic History   Marital status: Single    Spouse name: Not on file   Number of children: Not on file   Years of education: Not on file   Highest education level: Not on file  Occupational History   Occupation: retired  Tobacco Use   Smoking status: Never   Smokeless tobacco: Never  Vaping Use   Vaping Use: Never used  Substance and Sexual Activity   Alcohol use: No    Alcohol/week: 0.0 standard drinks of alcohol   Drug use: No   Sexual activity: Not on file  Other Topics Concern   Not on file  Social History  Narrative   Lives at home. Independent, no assisted devices at baseline   Social Determinants of Health   Financial Resource Strain: Not on file  Food Insecurity: Not on file  Transportation Needs: Not on file  Physical Activity: Not on file  Stress: Not on file  Social Connections: Not on file  Intimate Partner Violence: Not on file    Family History  Problem Relation Age of Onset   CAD Mother    Kidney cancer Father    Prostate cancer Neg Hx    Bladder  Cancer Neg Hx       Review of systems complete and found to be negative unless listed above      PHYSICAL EXAM  General: Well developed, well nourished, in no acute distress HEENT:  Normocephalic and atramatic Neck:  No JVD.  Lungs: Clear bilaterally to auscultation and percussion. Heart: Tachycardia. Normal S1 and S2 without gallops or murmurs.  Abdomen: Bowel sounds are positive, abdomen soft and non-tender  Msk:  Back normal, normal gait. Normal strength and tone for age. Extremities: No clubbing, cyanosis or edema.   Neuro: Alert and oriented X 3. Psych:  Good affect, responds appropriately  Labs:   Lab Results  Component Value Date   WBC 5.9 05/12/2022   HGB 13.2 05/12/2022   HCT 42.7 05/12/2022   MCV 96.0 05/12/2022   PLT 165 05/12/2022   No results for input(s): "NA", "K", "CL", "CO2", "BUN", "CREATININE", "CALCIUM", "PROT", "BILITOT", "ALKPHOS", "ALT", "AST", "GLUCOSE" in the last 168 hours.  Invalid input(s): "LABALBU" Lab Results  Component Value Date   TROPONINI <0.03 03/02/2018    Lab Results  Component Value Date   CHOL 149 06/12/2021   Lab Results  Component Value Date   HDL 46 06/12/2021   Lab Results  Component Value Date   LDLCALC 98 06/12/2021   Lab Results  Component Value Date   TRIG 27 06/12/2021   Lab Results  Component Value Date   CHOLHDL 3.2 06/12/2021   No results found for: "LDLDIRECT"    Radiology: DG Chest Portable 1 View  Result Date: 05/12/2022 CLINICAL DATA:  Shortness of breath EXAM: PORTABLE CHEST 1 VIEW COMPARISON:  X-ray 01/10/2022 FINDINGS: Left upper chest defibrillator. Heart enlarged. Calcified and tortuous aorta. Mild vascular congestion which is new from previous. Elevated right hemidiaphragm with a tiny right effusion and adjacent atelectasis. No pneumothorax. Overlapping cardiac leads. Osteopenia. IMPRESSION: Developing vascular congestion.  Tiny right effusion. Enlarged heart with calcified aorta and  defibrillator Electronically Signed   By: Karen Kays M.D.   On: 05/12/2022 13:55    EKG: Wide-complex tachycardia rate of 140 probably atrial flutter VT cannot be completely excluded  ASSESSMENT AND PLAN:  Wide-complex tachycardia Possibly atrial flutter Paced rhythm Chronic systolic congestive heart failure Nonischemic dilated cardiomyopathy History of ventricular tachycardia AICD in place Hypotension Chronic renal insufficiency Atrial fibrillation paroxysmal . Agree with admit ICU level care Pressor therapy to help support blood pressure IV amiodarone load and drip for arrhythmia Consider echocardiogram for evaluation of cardiomyopathy Diuresis probably with a Lasix drip Anticoagulation to be continued either with continued Eliquis or switch to heparin IV while in the hospital AICD in place-no recent discharges Consider nephrology input for renal insufficiency Do not recommend electrical cardioversion as he is going back and forth No invasive procedures recommended at this point  Signed: Alwyn Pea MD 05/12/2022, 4:33 PM

## 2022-05-12 NOTE — Progress Notes (Signed)
An USGPIV (ultrasound guided PIV) has been placed for short-term vasopressor infusion. A correctly placed ivWatch must be used when administering Vasopressors. Should this treatment be needed beyond 72 hours, central line access should be obtained.  It will be the responsibility of the bedside nurse to follow best practice to prevent extravasations.   

## 2022-05-12 NOTE — Progress Notes (Signed)
Called Dr. Belia Heman troponin up to 73. At this time no additional orders, continue to observe.

## 2022-05-12 NOTE — H&P (Signed)
NAME:  Spencer Coleman, MRN:  [id number], DOB:  09/29/1930, LOS: 0 ADMISSION DATE:  05/12/2022, CONSULTATION DATE: 05/12/2022 REFERRING MD: Dr. Fuller Plan, CHIEF COMPLAINT: Shortness of Breath    History of Present Illness:  This is an 89 yo male with a PMH of Paroxysmal Atrial Fibrillation, Ventricular Tachycardia s/p BiV ICD (08/09/2007), HLD, HTN, Dilated Cardiomyopathy, Chronic Systolic CHF, and Chronic Kidney Disease.  He presented to [facility] via EMS on 05/2 with c/o shortness of breath.  Pt reports symptoms started 02:00 am this morning.  He did not call EMS at that time because he did not want to wake them up.  However, pts sister stated the pt appeared weak yesterday therefore she called to check on the pt today and decided to call EMS.  Pts sister reported the pt has possible UTI and upon reviewing pts chart a urine culture was sent.  Pt was not started on abx awaiting culture results.    ED Course Upon arrival to the ER pt noted to be in a wide-complex tachycardia heart rate in the 140's with soft bp readings sbp 80-90's.  Pt has an ICD and per [employer] pts device only picks ups if the heart rate is greater than 150's.  ER provider contacted on call Cardiologist Dr. Juliann Pares who recommended 12 mg of adenosine, however pt remained tachycardic.  Therefore, per Dr. Glennis Brink recommendations pt received amiodarone bolus followed by amiodarone gtt.  Pt also noted to have an elevated bnp of 3,155.4 and CXR concerning for vascular congestion and tiny right effusion.  Per cardiology recommendations pt received 80 mg iv lasix x 1 dose per cardiology recommendations.  PCCM team contacted for ICU admission given possible need of vasopressors following administration of iv lasix.   Pertinent  Medical History  Paroxysmal Atrial Fibrillation  Ventricular Tachycardia s/p BiV ICD (08/09/2007) HLD HTN  Dilated Cardiomyopathy  Chronic Systolic CHF (Echo 06/13/21: EF 20 to 25%; grade I diastolic dysfunction; moderate to  severe mitral valve regurgitation; mild aortic valve stenosis; mild to moderate valve regurgitation) Stage 3a CKD  Kidney Stones   Significant Hospital Events: Including procedures, antibiotic start and stop dates in addition to other pertinent events   05/2: Pt admitted with acute respiratory failure secondary to acute on chronic systolic CHF and wide complex tachycardia requiring amiodarone gtt with possible vasopressor requirements   Interim History / Subjective:  Pt resting comfortably in bed denies shortness of breath and does not appear in distress on RA with O2 sats 96%.  Map 65 and currently not requiring vasopressors.  150 mg amiodarone bolus infusing pts cardiac rhythm wide-complex tachycardia hr 137  Objective   Blood pressure (!) 81/62, pulse (!) 137, temperature 97.6 F (36.4 C), resp. rate 18, height 5\' 10"  (1.778 m), weight 65.8 kg, SpO2 91 %.       No intake or output data in the 24 hours ending 05/12/22 1554 Filed Weights   05/12/22 1332  Weight: 65.8 kg    Examination: General: Acute on chronically-ill frail elderly male resting in bed, NAD on RA  HENT: Supple, no JVD  Lungs: Faint crackles throughout, even, non labored  Cardiovascular: Wide-complex tachycardia, hr 137, no m/r/g, 2+ radial/1+ distal pulses, 2+ RLE pitting edema, 1+ LLE edema  Abdomen: +BS x4, soft, non distended, non tender  Extremities: Moves all extremities  Neuro: Alert and able to follow commands, confused to situation at time, PERRLA  GU: Deferred   Resolved Hospital Problem list     Assessment &  Plan:  #Acute respiratory failure secondary to acute on chronic systolic CHF  - Supplemental O2 for dyspnea and/or hypoxic - Maintain O2 sats >92% - Follow CXR - COVID-19 and respiratory viral panel pending  - Trend WBC and monitor curve - Follow cultures - Will check pct if elevated will start empiric abx therapy   #Wide-complex tachycardia  #Acute on chronic systolic CHF  Hx: Dilated  cardiomyopathy, HLD, HTN, ventricular tachycardia s/p ICD, and paroxysmal atrial fibrillation  - Continuous telemetry monitoring  - Cardiology consulted appreciate input: continue amiodarone gtt per recommendations  - Diurese as bp and renal function permits  - Maintain map >65 - Hold outpatient metoprolol given soft bp's  - Echo pending  - Continue outpatient apixaban  - Trend troponin's until peaked  - Will r/o sepsis  - [place] RLE to r/o VTE   Stage 3a CKD  - Trend BMP - Replace electrolytes as indicated  - Monitor UOP - Avoid nephrotoxic medications   Best Practice (right click and "Reselect all SmartList Selections" daily)   Diet/type: Regular consistency (see orders) DVT prophylaxis: Apixaban  GI prophylaxis: N/A Lines: N/A Foley:  N/A Code Status:  full code Last date of multidisciplinary goals of care discussion [N/A]  Palliative Care consulted to address code status and goals of care.  Recommend changing code status to DNR/DNI given pts chronic illness's.  Attempted to contact pts daughter [name] via telephone, however unable to get in contact with her.   Labs   CBC: Recent Labs  Lab 05/12/22 1338  WBC 5.9  NEUTROABS 3.6  HGB 13.2  HCT 42.7  MCV 96.0  PLT 165    Basic Metabolic Panel: No results for input(s): "NA", "K", "CL", "CO2", "GLUCOSE", "BUN", "CREATININE", "CALCIUM", "MG", "PHOS" in the last 168 hours. GFR: CrCl cannot be calculated (Patient's most recent lab result is older than the maximum 21 days allowed.). Recent Labs  Lab 05/12/22 1338  WBC 5.9    Liver Function Tests: No results for input(s): "AST", "ALT", "ALKPHOS", "BILITOT", "PROT", "ALBUMIN" in the last 168 hours. No results for input(s): "LIPASE", "AMYLASE" in the last 168 hours. No results for input(s): "AMMONIA" in the last 168 hours.  ABG    Component Value Date/Time   HCO3 29.0 (H) 06/12/2021 0437     Coagulation Profile: No results for input(s): "INR", "PROTIME" in the last  168 hours.  Cardiac Enzymes: No results for input(s): "CKTOTAL", "CKMB", "CKMBINDEX", "TROPONINI" in the last 168 hours.  HbA1C: Hgb A1c MFr Bld  Date/Time Value Ref Range Status  06/12/2021 04:05 PM 5.6 4.8 - 5.6 % Final    Comment:    (NOTE) Pre diabetes:          5.7%-6.4%  Diabetes:              >6.4%  Glycemic control for   <7.0% adults with diabetes     CBG: No results for input(s): "GLUCAP" in the last 168 hours.  Review of Systems: Positives in BOLD   Gen: Denies fever, chills, weight change, fatigue, night sweats HEENT: Denies blurred vision, double vision, hearing loss, tinnitus, sinus congestion, rhinorrhea, sore throat, neck stiffness, dysphagia PULM: shortness of breath, cough, sputum production, hemoptysis, wheezing CV: Denies chest pain, edema, orthopnea, paroxysmal nocturnal dyspnea, palpitations GI: Denies abdominal pain, nausea, vomiting, diarrhea, hematochezia, melena, constipation, change in bowel habits GU: Denies dysuria, hematuria, polyuria, oliguria, urethral discharge Endocrine: Denies hot or cold intolerance, polyuria, polyphagia or appetite change Derm: Denies rash, dry skin,  scaling or peeling skin change Heme: Denies easy bruising, bleeding, bleeding gums Neuro: Denies headache, numbness, weakness, slurred speech, loss of memory or consciousness   Past Medical History:  He,  has a past medical history of CAD (coronary artery disease), CHF (congestive heart failure) (HCC), CKD (chronic kidney disease), History of kidney stones, Hyperlipidemia, Hypertension, Inguinal hernia, PAF (paroxysmal atrial fibrillation) (HCC), Patella fracture (01/09/2015), Renal colic (10/07/2011), and Ventricular tachycardia (HCC).   Surgical History:   Past Surgical History:  Procedure Laterality Date   CARDIAC CATHETERIZATION     CARDIAC DEFIBRILLATOR PLACEMENT  2009   CYSTOSCOPY WITH LITHOLAPAXY N/A 02/21/2018   Procedure: CYSTOSCOPY WITH LITHOLAPAXY;  Surgeon:  Sondra Come, MD;  Location: [facility] ORS;  Service: Urology;  Laterality: N/A;   ELBOW SURGERY Left    HOLMIUM LASER APPLICATION N/A 02/21/2018   Procedure: HOLMIUM LASER APPLICATION;  Surgeon: Sondra Come, MD;  Location: [facility] ORS;  Service: Urology;  Laterality: N/A;   ICD GENERATOR CHANGEOUT N/A 01/28/2021   Procedure: ICD GENERATOR CHANGEOUT;  Surgeon: Marcina Millard, MD;  Location: [facility] INVASIVE CV LAB;  Service: Cardiovascular;  Laterality: N/A;   INGUINAL HERNIA REPAIR     LITHOTRIPSY     ORIF left wrist fracture Left    ORIF PATELLA Left 01/10/2015   Procedure: OPEN REDUCTION INTERNAL (ORIF) FIXATION PATELLA;  Surgeon: Donato Heinz, MD;  Location: [facility] ORS;  Service: Orthopedics;  Laterality: Left;   ORIF PATELLA Left 02/06/2015   Procedure: OPEN REDUCTION INTERNAL (ORIF) FIXATION PATELLA;  Surgeon: Donato Heinz, MD;  Location: [facility] ORS;  Service: Orthopedics;  Laterality: Left;   TRANSURETHRAL RESECTION OF PROSTATE N/A 02/21/2018   Procedure: TRANSURETHRAL RESECTION OF THE PROSTATE (TURP);  Surgeon: Sondra Come, MD;  Location: [facility] ORS;  Service: Urology;  Laterality: N/A;     Social History:   reports that he has never smoked. He has never used smokeless tobacco. He reports that he does not drink alcohol and does not use drugs.   Family History:  His family history includes CAD in his mother; Kidney cancer in his father. There is no history of Prostate cancer or Bladder Cancer.   Allergies No Known Allergies   Home Medications  Prior to Admission medications   Medication Sig Start Date End Date Taking? Authorizing Provider  acetaminophen (TYLENOL) 500 MG tablet Take 2 tablets (1,000 mg total) by mouth every 6 (six) hours. 02/22/18   Sondra Come, MD  amiodarone (PACERONE) 400 MG tablet Take 400 mg by mouth daily.    [provider]  apixaban (ELIQUIS) 2.5 MG TABS tablet Take 1 tablet (2.5 mg total) by mouth 2 (two) times daily. 06/14/21   Wouk, Wilfred Curtis, MD  furosemide (LASIX) 20 MG tablet Take 20 mg by mouth daily. 11/09/20   [provider]  metoprolol succinate (TOPROL-XL) 25 MG 24 hr tablet Take 25 mg by mouth daily. 03/17/21   [provider]  simvastatin (ZOCOR) 40 MG tablet Take 40 mg by mouth at bedtime. 11/09/20   [provider]     Critical care time: 55 minutes      Zada Girt, AGNP  Pulmonary/Critical Care Pager [phone] (please enter 7 digits) PCCM Consult Pager [phone] (please enter 7 digits)

## 2022-05-13 ENCOUNTER — Inpatient Hospital Stay
Admit: 2022-05-13 | Discharge: 2022-05-13 | Disposition: A | Discharge status: Home / Self Care | Payer: Medicare HMO | Attending: Critical Care Medicine | Admitting: Critical Care Medicine

## 2022-05-13 DIAGNOSIS — J9601 Acute respiratory failure with hypoxia: Secondary | ICD-10-CM

## 2022-05-13 DIAGNOSIS — I5023 Acute on chronic systolic (congestive) heart failure: Secondary | ICD-10-CM

## 2022-05-13 DIAGNOSIS — I420 Dilated cardiomyopathy: Secondary | ICD-10-CM

## 2022-05-13 LAB — RESPIRATORY PANEL BY PCR

## 2022-05-13 LAB — BASIC METABOLIC PANEL
Anion gap: 9 (ref 5–15)
BUN: 26 mg/dL — ABNORMAL HIGH (ref 8–23)
CO2: 26 mmol/L (ref 22–32)
Calcium: 8.6 mg/dL — ABNORMAL LOW (ref 8.9–10.3)
Chloride: 105 mmol/L (ref 98–111)
Creatinine, Ser: 1.69 mg/dL — ABNORMAL HIGH (ref 0.61–1.24)
GFR, Estimated: 38 mL/min — ABNORMAL LOW (ref >60)
Glucose, Bld: 119 mg/dL — ABNORMAL HIGH (ref 70–99)
Potassium: 3.5 mmol/L (ref 3.5–5.1)
Sodium: 140 mmol/L (ref 135–145)

## 2022-05-13 LAB — ECHOCARDIOGRAM COMPLETE
AR max vel: 2.07 cm2
AV Area VTI: 1.91 cm2
AV Area mean vel: 1.92 cm2
AV Mean grad: 6.5 mmHg
AV Peak grad: 12.7 mmHg
Ao pk vel: 1.78 m/s
Area-P 1/2: 5.27 cm2
Calc EF: 23 %
Height: 70 in
MV M vel: 4.66 m/s
MV Peak grad: 86.9 mmHg
MV VTI: 3.05 cm2
P 1/2 time: 414 msec
Radius: 0.8 cm
S' Lateral: 6.5 cm
Single Plane A2C EF: 25.7 %
Single Plane A4C EF: 22.6 %
Weight: 2458.57 oz

## 2022-05-13 LAB — CBC
HCT: 39.3 % (ref 39.0–52.0)
Hemoglobin: 12.4 g/dL — ABNORMAL LOW (ref 13.0–17.0)
MCH: 29.5 pg (ref 26.0–34.0)
MCHC: 31.6 g/dL (ref 30.0–36.0)
MCV: 93.3 fL (ref 80.0–100.0)
Platelets: 141 10*3/uL — ABNORMAL LOW (ref 150–400)
RBC: 4.21 MIL/uL — ABNORMAL LOW (ref 4.22–5.81)
RDW: 15.9 % — ABNORMAL HIGH (ref 11.5–15.5)
WBC: 6.5 10*3/uL (ref 4.0–10.5)
nRBC: 0 % (ref 0.0–0.2)

## 2022-05-13 LAB — LACTIC ACID, PLASMA: Lactic Acid, Venous: 1.7 mmol/L (ref 0.5–1.9)

## 2022-05-13 LAB — CULTURE, BLOOD (ROUTINE X 2): Special Requests: ADEQUATE

## 2022-05-13 LAB — MAGNESIUM: Magnesium: 2.1 mg/dL (ref 1.7–2.4)

## 2022-05-13 LAB — PHOSPHORUS: Phosphorus: 3.7 mg/dL (ref 2.5–4.6)

## 2022-05-13 MED ORDER — METOPROLOL TARTRATE 5 MG/5ML IV SOLN
INTRAVENOUS | Status: AC
  Administered 2022-05-13: 5 mg via INTRAVENOUS
  Filled 2022-05-13: qty 5

## 2022-05-13 MED ORDER — FUROSEMIDE 10 MG/ML IJ SOLN
20.0000 mg | Freq: Every day | INTRAMUSCULAR | Status: DC
  Administered 2022-05-14 – 2022-05-16 (×3): 20 mg via INTRAVENOUS
  Filled 2022-05-13 (×3): qty 2

## 2022-05-13 MED ORDER — METOPROLOL TARTRATE 5 MG/5ML IV SOLN
5.0000 mg | Freq: Once | INTRAVENOUS | Status: AC
  Administered 2022-05-13: 2.5 mg via INTRAVENOUS

## 2022-05-13 MED ORDER — POTASSIUM CHLORIDE CRYS ER 20 MEQ PO TBCR
20.0000 meq | EXTENDED_RELEASE_TABLET | Freq: Once | ORAL | Status: AC
  Administered 2022-05-13: 20 meq via ORAL
  Filled 2022-05-13: qty 1

## 2022-05-13 MED ORDER — DIAZEPAM 5 MG/ML IJ SOLN
2.5000 mg | Freq: Once | INTRAMUSCULAR | Status: AC
  Administered 2022-05-13: 2.5 mg via INTRAVENOUS
  Filled 2022-05-13: qty 2

## 2022-05-13 MED ORDER — SIMVASTATIN 20 MG PO TABS
40.0000 mg | ORAL_TABLET | Freq: Every day | ORAL | Status: DC
  Administered 2022-05-13 – 2022-05-30 (×18): 40 mg via ORAL
  Filled 2022-05-13 (×18): qty 2

## 2022-05-13 MED ORDER — AMIODARONE HCL 200 MG PO TABS
200.0000 mg | ORAL_TABLET | Freq: Two times a day (BID) | ORAL | Status: DC
  Administered 2022-05-13 – 2022-05-17 (×8): 200 mg via ORAL
  Filled 2022-05-13 (×9): qty 1

## 2022-05-13 MED ORDER — HALOPERIDOL LACTATE 5 MG/ML IJ SOLN
5.0000 mg | Freq: Four times a day (QID) | INTRAMUSCULAR | Status: DC | PRN
  Administered 2022-05-13: 5 mg via INTRAMUSCULAR
  Filled 2022-05-13: qty 1

## 2022-05-13 MED ORDER — ZIPRASIDONE MESYLATE 20 MG IM SOLR
10.0000 mg | Freq: Once | INTRAMUSCULAR | Status: AC
  Administered 2022-05-13: 10 mg via INTRAMUSCULAR
  Filled 2022-05-13: qty 20

## 2022-05-13 NOTE — Progress Notes (Signed)
PHARMACY CONSULT NOTE  Pharmacy Consult for Electrolyte Monitoring and Replacement   Recent Labs: Potassium (mmol/L)  Date Value  05/13/2022 3.5  04/09/2013 4.2   Magnesium (mg/dL)  Date Value  40/98/1191 2.1   Calcium (mg/dL)  Date Value  47/82/9562 8.6 (L)   Calcium, Total (mg/dL)  Date Value  13/08/6576 9.3   Albumin (g/dL)  Date Value  46/96/2952 3.4 (L)   Phosphorus (mg/dL)  Date Value  84/13/2440 3.7   Sodium (mmol/L)  Date Value  05/13/2022 140  04/09/2013 140     Assessment: 89 yo male with a PMH of atrial fibrillation,ventricular tachycardia s/p BiV ICD (08/09/2007), HLD, HTN, Dilated Cardiomyopathy, CHF, and CKD presented to [facility] via EMS on 05/2 with c/o shortness of breath. Pharmacy is asked to follow and replace electrolytes while in CCU  Goal of Therapy:  Potassium 4.0 - 5.1 mmol/L Magnesium 2.0 - 2.4 mg/dL All Other Electrolytes WNL  Plan:  -- 20 mEq oral KCl x 1 -- recheck electrolytes in am  Lowella Bandy ,PharmD Clinical Pharmacist 05/13/2022 7:20 AM

## 2022-05-13 NOTE — Progress Notes (Signed)
1610: pt converted from A/V paced rhythm to SVT with wide QRS complex. NP notified.   9604: NP bedside, verbal order for 5mg  Metoprolol IV. RN was instructed to give 2.5mg  Metoprolol and monitor pt response.   5409: 2.5mg  metoprolol given IV. EKG completed.   0630: Pt response to metoprolol: decreased HR to 110s from 120s, decreased SBP to 80s from 110s. NP notifed of pt response to medication. RN instructed to hold remaining 2.5mg  of Metoprolol by NP.

## 2022-05-13 NOTE — Discharge Instructions (Signed)

## 2022-05-13 NOTE — Progress Notes (Signed)
[facility]    SUBJECTIVE: Patient feels much improved less shortness of breath improved hypotension improved tachycardia sitting up in chair   Vitals:   05/13/22 0730 05/13/22 0800 05/13/22 0900 05/13/22 1000  BP: (!) 89/71 118/69 98/64 113/76  Pulse: (!) 118 71  70  Resp: (!) 21 19 (!) 27 (!) 22  Temp:  97.7 F (36.5 C)    TempSrc:  Oral    SpO2: 95% 93%  (!) 88%  Weight:      Height:         Intake/Output Summary (Last 24 hours) at 05/13/2022 1427 Last data filed at 05/13/2022 1416 Gross per 24 hour  Intake 732.85 ml  Output 500 ml  Net 232.85 ml      PHYSICAL EXAM  General: Well developed, well nourished, in no acute distress HEENT:  Normocephalic and atramatic Neck:  No JVD.  Lungs: Clear bilaterally to auscultation and percussion. Heart: HRRR . Normal S1 and S2 without gallops or murmurs.  Abdomen: Bowel sounds are positive, abdomen soft and non-tender  Msk:  Back normal, normal gait. Normal strength and tone for age. Extremities: No clubbing, cyanosis or edema.   Neuro: Alert and oriented X 3. Psych:  Good affect, responds appropriately   LABS: Basic Metabolic Panel: Recent Labs    05/12/22 1715 05/13/22 0407  NA 140 140  K 3.8 3.5  CL 103 105  CO2 25 26  GLUCOSE 135* 119*  BUN 26* 26*  CREATININE 1.73* 1.69*  CALCIUM 8.9 8.6*  MG 2.2 2.1  PHOS 4.2 3.7   Liver Function Tests: Recent Labs    05/12/22 1715  AST 28  ALT 16  ALKPHOS 89  BILITOT 2.2*  PROT 5.7*  ALBUMIN 3.4*   No results for input(s): "LIPASE", "AMYLASE" in the last 72 hours. CBC: Recent Labs    05/12/22 1338 05/13/22 0407  WBC 5.9 6.5  NEUTROABS 3.6  --   HGB 13.2 12.4*  HCT 42.7 39.3  MCV 96.0 93.3  PLT 165 141*   Cardiac Enzymes: No results for input(s): "CKTOTAL", "CKMB", "CKMBINDEX", "TROPONINI" in the last 72 hours. BNP: Invalid input(s): "POCBNP" D-Dimer: No results for input(s): "DDIMER" in the last 72 hours. Hemoglobin A1C: No results for input(s):  "HGBA1C" in the last 72 hours. Fasting Lipid Panel: No results for input(s): "CHOL", "HDL", "LDLCALC", "TRIG", "CHOLHDL", "LDLDIRECT" in the last 72 hours. Thyroid Function Tests: No results for input(s): "TSH", "T4TOTAL", "T3FREE", "THYROIDAB" in the last 72 hours.  Invalid input(s): "FREET3" Anemia Panel: No results for input(s): "VITAMINB12", "FOLATE", "FERRITIN", "TIBC", "IRON", "RETICCTPCT" in the last 72 hours.  ECHOCARDIOGRAM COMPLETE  Result Date: 05/13/2022    ECHOCARDIOGRAM REPORT   Patient Name:   Spencer Coleman Date of Exam: 05/13/2022 Medical Rec #:  [id number]    Height:       70.0 in Accession #:    [id number]   Weight:       153.7 lb Date of Birth:  1930-09-10     BSA:          1.866 m Patient Age:    89 years     BP:           89/71 mmHg Patient Gender: M            HR:           75 bpm. Exam Location:  [facility] Procedure: 2D Echo, Cardiac Doppler, Color Doppler and Strain Analysis Indications:     Abnormal ECG  History:         Patient has prior history of Echocardiogram examinations, most                  recent 06/13/2021. CHF, CAD, Abnormal ECG and Defibrillator,                  Arrythmias:Atrial Fibrillation and Tachycardia; Risk                  Factors:Dyslipidemia and Hypertension.  Sonographer:     Mikki Harbor Referring Phys:  [id number] DANA G NELSON Diagnosing Phys: Adrian Blackwater IMPRESSIONS  1. Left ventricular ejection fraction, by estimation, is 20 to 25%. The left ventricle has severely decreased function. The left ventricle demonstrates global hypokinesis. The left ventricular internal cavity size was severely dilated. There is moderate  left ventricular hypertrophy. Left ventricular diastolic parameters are consistent with Grade II diastolic dysfunction (pseudonormalization).  2. Right ventricular systolic function is moderately reduced. The right ventricular size is moderately enlarged. Mildly increased right ventricular wall thickness. There is normal pulmonary artery systolic  pressure.  3. Left atrial size was severely dilated.  4. Right atrial size was severely dilated.  5. The mitral valve is myxomatous. Severe mitral valve regurgitation.  6. Tricuspid valve regurgitation is mild to moderate.  7. The aortic valve is calcified. Aortic valve regurgitation is moderate.  8. Pulmonic valve regurgitation is moderate.  9. Aortic dilatation noted. Conclusion(s)/Recommendation(s): Findings consistent with dilated cardiomyopathy. FINDINGS  Left Ventricle: Left ventricular ejection fraction, by estimation, is 20 to 25%. The left ventricle has severely decreased function. The left ventricle demonstrates global hypokinesis. The left ventricular internal cavity size was severely dilated. There is moderate left ventricular hypertrophy. Left ventricular diastolic parameters are consistent with Grade II diastolic dysfunction (pseudonormalization). Right Ventricle: The right ventricular size is moderately enlarged. Mildly increased right ventricular wall thickness. Right ventricular systolic function is moderately reduced. There is normal pulmonary artery systolic pressure. The tricuspid regurgitant velocity is 2.61 m/s, and with an assumed right atrial pressure of 8 mmHg, the estimated right ventricular systolic pressure is 35.2 mmHg. Left Atrium: Left atrial size was severely dilated. Right Atrium: Right atrial size was severely dilated. Pericardium: There is no evidence of pericardial effusion. Mitral Valve: The mitral valve is myxomatous. Severe mitral valve regurgitation. MV peak gradient, 2.5 mmHg. The mean mitral valve gradient is 1.0 mmHg. Tricuspid Valve: The tricuspid valve is grossly normal. Tricuspid valve regurgitation is mild to moderate. Aortic Valve: The aortic valve is calcified. Aortic valve regurgitation is moderate. Aortic regurgitation PHT measures 414 msec. Aortic valve mean gradient measures 6.5 mmHg. Aortic valve peak gradient measures 12.7 mmHg. Aortic valve area, by VTI  measures 1.91 cm. Pulmonic Valve: The pulmonic valve was grossly normal. Pulmonic valve regurgitation is moderate. Aorta: Aortic dilatation noted. IAS/Shunts: No atrial level shunt detected by color flow Doppler.  LEFT VENTRICLE PLAX 2D LVIDd:         7.00 cm      Diastology LVIDs:         6.50 cm      LV e' medial:    3.16 cm/s LV PW:         0.80 cm      LV E/e' medial:  24.9 LV IVS:        0.90 cm      LV e' lateral:   4.70 cm/s LVOT diam:     2.00 cm      LV E/e' lateral:  16.7 LV SV:         68 LV SV Index:   37 LVOT Area:     3.14 cm  LV Volumes (MOD) LV vol d, MOD A2C: 222.0 ml LV vol d, MOD A4C: 177.0 ml LV vol s, MOD A2C: 165.0 ml LV vol s, MOD A4C: 137.0 ml LV SV MOD A2C:     57.0 ml LV SV MOD A4C:     177.0 ml LV SV MOD BP:      46.7 ml RIGHT VENTRICLE RV Basal diam:  3.65 cm RV Mid diam:    3.30 cm RV S prime:     10.20 cm/s TAPSE (M-mode): 1.3 cm LEFT ATRIUM              Index        RIGHT ATRIUM           Index LA diam:        5.40 cm  2.89 cm/m   RA Area:     15.60 cm LA Vol (A2C):   93.3 ml  50.00 ml/m  RA Volume:   42.60 ml  22.83 ml/m LA Vol (A4C):   100.0 ml 53.59 ml/m LA Biplane Vol: 103.0 ml 55.19 ml/m  AORTIC VALVE                     PULMONIC VALVE AV Area (Vmax):    2.07 cm      PV Vmax:       0.70 m/s AV Area (Vmean):   1.92 cm      PV Peak grad:  1.9 mmHg AV Area (VTI):     1.91 cm AV Vmax:           178.00 cm/s AV Vmean:          121.500 cm/s AV VTI:            0.358 m AV Peak Grad:      12.7 mmHg AV Mean Grad:      6.5 mmHg LVOT Vmax:         117.50 cm/s LVOT Vmean:        74.300 cm/s LVOT VTI:          0.218 m LVOT/AV VTI ratio: 0.61 AI PHT:            414 msec  AORTA Ao Root diam: 3.40 cm Ao Asc diam:  3.40 cm MITRAL VALVE                  TRICUSPID VALVE MV Area (PHT): 5.27 cm       TR Peak grad:   27.2 mmHg MV Area VTI:   3.05 cm       TR Vmax:        261.00 cm/s MV Peak grad:  2.5 mmHg MV Mean grad:  1.0 mmHg       SHUNTS MV Vmax:       0.79 m/s       Systemic VTI:  0.22  m MV Vmean:      48.4 cm/s      Systemic Diam: 2.00 cm MV Decel Time: 144 msec MR Peak grad:    86.9 mmHg MR Mean grad:    55.0 mmHg MR Vmax:         466.00 cm/s MR Vmean:        349.0 cm/s MR PISA:         4.02 cm MR PISA Eff  ROA: 80 mm MR PISA Radius:  0.80 cm MV E velocity: 78.60 cm/s MV A velocity: 56.10 cm/s MV E/A ratio:  1.40 Shaukat Khan Electronically signed by Adrian Blackwater Signature Date/Time: 05/13/2022/12:18:19 PM    Final    US Venous Img Lower Unilateral Right  Result Date: 05/12/2022 CLINICAL DATA:  Leg swelling EXAM: RIGHT LOWER EXTREMITY VENOUS DOPPLER ULTRASOUND TECHNIQUE: Gray-scale sonography with compression, as well as color and duplex ultrasound, were performed to evaluate the deep venous system(s) from the level of the common femoral vein through the popliteal and proximal calf veins. COMPARISON:  None Available. FINDINGS: VENOUS Normal compressibility of the common femoral, superficial femoral, and popliteal veins, as well as the visualized calf veins. Visualized portions of profunda femoral vein and great saphenous vein unremarkable. No filling defects to suggest DVT on grayscale or color Doppler imaging. Doppler waveforms show normal direction of venous flow, normal respiratory plasticity and response to augmentation. Limited views of the contralateral common femoral vein are unremarkable. OTHER Soft tissue edema of the lower right leg. Limitations: none IMPRESSION: No evidence of right lower extremity DVT. Electronically Signed   By: Allegra Lai M.D.   On: 05/12/2022 16:58   DG Chest Portable 1 View  Result Date: 05/12/2022 CLINICAL DATA:  Shortness of breath EXAM: PORTABLE CHEST 1 VIEW COMPARISON:  X-ray 01/10/2022 FINDINGS: Left upper chest defibrillator. Heart enlarged. Calcified and tortuous aorta. Mild vascular congestion which is new from previous. Elevated right hemidiaphragm with a tiny right effusion and adjacent atelectasis. No pneumothorax. Overlapping cardiac leads.  Osteopenia. IMPRESSION: Developing vascular congestion.  Tiny right effusion. Enlarged heart with calcified aorta and defibrillator Electronically Signed   By: Karen Kays M.D.   On: 05/12/2022 13:55     Echo globally depressed left ventricular function EF less than 25%  TELEMETRY: Paced rhythm:  ASSESSMENT AND PLAN:  Principal Problem:   Acute hypoxic respiratory failure (HCC) Hypotension Atrial fibrillation Generalized weakness Dilated cardiomyopathy Acute on chronic systolic congestive heart failure Hypoxemia Pacemaker in place Wide-complex tachycardia Hyperlipidemia Chronic renal insufficiency History of ventricular tachycardia status post AICD . Conclusion Recommend transfer to telemetry floor Increase activity ambulate in the hall Condition from IV amnio to p.o. Anticoagulation with Eliquis in place of heparin Consider midodrine for hypotension Adequate hydration for renal insufficiency dehydration Consider physical therapy Consider rehab as a transition prior to home  Alwyn Pea, MD 05/13/2022 2:27 PM

## 2022-05-13 NOTE — Progress Notes (Signed)
       CROSS COVER NOTE  NAME: Spencer Coleman MRN: [id number] DOB : 1930/04/08    HPI/Events of Note   Report:after arrival to 2A patient became very agitated and combative trying to throw legs out of bed wanting to leave   On review of chart:patient  admitted with acute respiratory failure and acute on chronic CHF. Complicated  EF 25%, dilated cardiomyopathy, chronic hypotension, a fib. Had complication from IV amio per card note. Currently AV paced 70 Was contacted by nurse prior to transfer with request to discontinue 1:1 sitter as patient was calm and cooperative  From 3p to 7p had needed  iv haldol and IM geodon    Assessment and  Interventions   Assessment: Calm and sleeping after receiving 2.5 valium IV RU extremely edematous and red forearm extending up to mid upper arm. Warmth and pulses equal to left  Plan: Elevate right arm, nv checks Continuous pulse ox Likely delirium with periods of agitation - need delirium treatment measure - restore day night routine - progressive mobility - frequent reorientation       Donnie Mesa NP [facility]

## 2022-05-13 NOTE — Progress Notes (Signed)
PROGRESS NOTE    Spencer Coleman  ZOX:[id number] DOB: 1930/02/01 DOA: 05/12/2022 PCP: Barbette Reichmann, MD    Assessment & Plan:   Principal Problem:   Acute hypoxic respiratory failure (HCC)  Assessment and Plan: Acute hypoxic respiratory failure: likely secondary to CHF exacerbation. Continue on supplemental oxygen and wean as tolerated. Viral respiratory panel is neg   Acute on chronic systolic CHF exacerbation: will continue on IV lasix. Monitor I/Os. BNP is elevated at 3,155. Cardio following and recs apprec   Dilated cardiomyopathy: w/ hx of ventricular tachycardia s/p AICD placement. D/c IV amio and start po amio as per cardio. Keep MAP >65. Continue on tele. Cardio following and recs apprec  PAF: d/c IV amio & started on po amio as per cardio. Continue on tele. Cardio following and recs apprec   HTN: holding home dose of metoprolol as BP is on low end of normal  HLD: continue on statin  CKDIIIa: Cr is trending down from day prior. Avoid nephrotoxic meds  Thrombocytopenia: etiology unclear. Will continue to monitor      DVT prophylaxis: eliquis  Code Status: full  Family Communication: called pt's sister, [name], no answer  Disposition Plan:  depends on PT/OT recs   Level of care: Stepdown  Status is: Inpatient Remains inpatient appropriate because: severity of illness    Consultants:  Cardio   Procedures:   Antimicrobials:   Subjective: Pt c/o fatigue   Objective: Vitals:   05/13/22 0635 05/13/22 0645 05/13/22 0700 05/13/22 0730  BP: (!) 80/67 (!) 85/72 97/71 (!) 89/71  Pulse:    (!) 118  Resp: (!) 25 17 (!) 33 (!) 21  Temp:      TempSrc:      SpO2:    95%  Weight:      Height:        Intake/Output Summary (Last 24 hours) at 05/13/2022 0837 Last data filed at 05/13/2022 0700 Gross per 24 hour  Intake 442.88 ml  Output 500 ml  Net -57.12 ml   Filed Weights   05/12/22 1332 05/12/22 1710 05/13/22 0500  Weight: 65.8 kg 70.9 kg 69.7 kg     Examination:  General exam: Appears calm and comfortable  Respiratory system: diminished breath sounds b/l  Cardiovascular system: irregularly irregular. No rubs, gallops or clicks.  Gastrointestinal system: Abdomen is nondistended, soft and nontender.  Normal bowel sounds heard. Central nervous system: Alert and awake. Moves all extremities  Psychiatry: Judgement and insight appears poor. Mood & affect appropriate.     Data Reviewed: I have personally reviewed following labs and imaging studies  CBC: Recent Labs  Lab 05/12/22 1338 05/13/22 0407  WBC 5.9 6.5  NEUTROABS 3.6  --   HGB 13.2 12.4*  HCT 42.7 39.3  MCV 96.0 93.3  PLT 165 141*   Basic Metabolic Panel: Recent Labs  Lab 05/12/22 1715 05/13/22 0407  NA 140 140  K 3.8 3.5  CL 103 105  CO2 25 26  GLUCOSE 135* 119*  BUN 26* 26*  CREATININE 1.73* 1.69*  CALCIUM 8.9 8.6*  MG 2.2 2.1  PHOS 4.2 3.7   GFR: Estimated Creatinine Clearance: 29.2 mL/min (A) (by C-G formula based on SCr of 1.69 mg/dL (H)). Liver Function Tests: Recent Labs  Lab 05/12/22 1715  AST 28  ALT 16  ALKPHOS 89  BILITOT 2.2*  PROT 5.7*  ALBUMIN 3.4*   No results for input(s): "LIPASE", "AMYLASE" in the last 168 hours. No results for input(s): "AMMONIA" in the  last 168 hours. Coagulation Profile: No results for input(s): "INR", "PROTIME" in the last 168 hours. Cardiac Enzymes: No results for input(s): "CKTOTAL", "CKMB", "CKMBINDEX", "TROPONINI" in the last 168 hours. BNP (last 3 results) No results for input(s): "PROBNP" in the last 8760 hours. HbA1C: No results for input(s): "HGBA1C" in the last 72 hours. CBG: Recent Labs  Lab 05/12/22 1714  GLUCAP 135*   Lipid Profile: No results for input(s): "CHOL", "HDL", "LDLCALC", "TRIG", "CHOLHDL", "LDLDIRECT" in the last 72 hours. Thyroid Function Tests: No results for input(s): "TSH", "T4TOTAL", "FREET4", "T3FREE", "THYROIDAB" in the last 72 hours. Anemia Panel: No results  for input(s): "VITAMINB12", "FOLATE", "FERRITIN", "TIBC", "IRON", "RETICCTPCT" in the last 72 hours. Sepsis Labs: Recent Labs  Lab 05/12/22 1715 05/13/22 0407  PROCALCITON <0.10  --   LATICACIDVEN 2.9* 1.7    Recent Results (from the past 240 hour(s))  SARS Coronavirus 2 by RT PCR (hospital order, performed in [facility] hospital lab) *cepheid single result test* Anterior Nasal Swab     Status: None   Collection Time: 05/12/22  3:54 PM   Specimen: Anterior Nasal Swab  Result Value Ref Range Status   SARS Coronavirus 2 by RT PCR NEGATIVE NEGATIVE Final    Comment: (NOTE) SARS-CoV-2 target nucleic acids are NOT DETECTED.  The SARS-CoV-2 RNA is generally detectable in upper and lower respiratory specimens during the acute phase of infection. The lowest concentration of SARS-CoV-2 viral copies this assay can detect is 250 copies / mL. A negative result does not preclude SARS-CoV-2 infection and should not be used as the sole basis for treatment or other patient management decisions.  A negative result may occur with improper specimen collection / handling, submission of specimen other than nasopharyngeal swab, presence of viral mutation(s) within the areas targeted by this assay, and inadequate number of viral copies (<250 copies / mL). A negative result must be combined with clinical observations, patient history, and epidemiological information.  Fact Sheet for Patients:   RoadLapTop.co.za  Fact Sheet for Healthcare Providers: http://kim-miller.com/  This test is not yet approved or  cleared by the [place] FDA and has been authorized for detection and/or diagnosis of SARS-CoV-2 by FDA under an Emergency Use Authorization (EUA).  This EUA will remain in effect (meaning this test can be used) for the duration of the COVID-19 declaration under Section 564(b)(1) of the Act, 21 U.S.C. section 360bbb-3(b)(1), unless the  authorization is terminated or revoked sooner.  Performed at [facility], [address] [place], [place] [address]   MRSA Next Gen by PCR, Nasal     Status: None   Collection Time: 05/12/22  5:12 PM   Specimen: Nasal Mucosa; Nasal Swab  Result Value Ref Range Status   MRSA by PCR Next Gen NOT DETECTED NOT DETECTED Final    Comment: (NOTE) The GeneXpert MRSA Assay (FDA approved for NASAL specimens only), is one component of a comprehensive MRSA colonization surveillance program. It is not intended to diagnose MRSA infection nor to guide or monitor treatment for MRSA infections. Test performance is not FDA approved in patients less than 59 years old. Performed at [facility], [address] [place], [place] [address]   Culture, blood (Routine X 2) w Reflex to ID Panel     Status: None (Preliminary result)   Collection Time: 05/12/22  5:33 PM   Specimen: BLOOD  Result Value Ref Range Status   Specimen Description BLOOD BLOOD LEFT ARM  Final   Special Requests  Final    BOTTLES DRAWN AEROBIC AND ANAEROBIC Blood Culture adequate volume   Culture   Final    NO GROWTH < 12 HOURS Performed at [facility], [address] [place], [place] [address]    Report Status PENDING  Incomplete  Culture, blood (Routine X 2) w Reflex to ID Panel     Status: None (Preliminary result)   Collection Time: 05/12/22  5:33 PM   Specimen: BLOOD  Result Value Ref Range Status   Specimen Description BLOOD BLOOD RIGHT HAND [facility]  Final   Special Requests   Final    BOTTLES DRAWN AEROBIC AND ANAEROBIC Blood Culture results may not be optimal due to an inadequate volume of blood received in culture bottles   Culture   Final    NO GROWTH < 12 HOURS Performed at [facility], [address] [place], [place] [address]    Report Status PENDING  Incomplete  Respiratory (~20 pathogens) panel by PCR     Status: None   Collection Time: 05/12/22  9:38 PM   Specimen:  Nasopharyngeal Swab; Respiratory  Result Value Ref Range Status   Adenovirus NOT DETECTED NOT DETECTED Final   Coronavirus 229E NOT DETECTED NOT DETECTED Final    Comment: (NOTE) The Coronavirus on the Respiratory Panel, DOES NOT test for the novel  Coronavirus (2019 nCoV)    Coronavirus HKU1 NOT DETECTED NOT DETECTED Final   Coronavirus NL63 NOT DETECTED NOT DETECTED Final   Coronavirus OC43 NOT DETECTED NOT DETECTED Final   Metapneumovirus NOT DETECTED NOT DETECTED Final   Rhinovirus / Enterovirus NOT DETECTED NOT DETECTED Final   Influenza A NOT DETECTED NOT DETECTED Final   Influenza B NOT DETECTED NOT DETECTED Final   Parainfluenza Virus 1 NOT DETECTED NOT DETECTED Final   Parainfluenza Virus 2 NOT DETECTED NOT DETECTED Final   Parainfluenza Virus 3 NOT DETECTED NOT DETECTED Final   Parainfluenza Virus 4 NOT DETECTED NOT DETECTED Final   Respiratory Syncytial Virus NOT DETECTED NOT DETECTED Final   Bordetella pertussis NOT DETECTED NOT DETECTED Final   Bordetella Parapertussis NOT DETECTED NOT DETECTED Final   Chlamydophila pneumoniae NOT DETECTED NOT DETECTED Final   Mycoplasma pneumoniae NOT DETECTED NOT DETECTED Final    Comment: Performed at [facility], [address] [place], [place] [address]         Radiology Studies: US Venous Img Lower Unilateral Right  Result Date: 05/12/2022 CLINICAL DATA:  Leg swelling EXAM: RIGHT LOWER EXTREMITY VENOUS DOPPLER ULTRASOUND TECHNIQUE: Gray-scale sonography with compression, as well as color and duplex ultrasound, were performed to evaluate the deep venous system(s) from the level of the common femoral vein through the popliteal and proximal calf veins. COMPARISON:  None Available. FINDINGS: VENOUS Normal compressibility of the common femoral, superficial femoral, and popliteal veins, as well as the visualized calf veins. Visualized portions of profunda femoral vein and great saphenous vein unremarkable. No filling defects to  suggest DVT on grayscale or color Doppler imaging. Doppler waveforms show normal direction of venous flow, normal respiratory plasticity and response to augmentation. Limited views of the contralateral common femoral vein are unremarkable. OTHER Soft tissue edema of the lower right leg. Limitations: none IMPRESSION: No evidence of right lower extremity DVT. Electronically Signed   By: Allegra Lai M.D.   On: 05/12/2022 16:58   DG Chest Portable 1 View  Result Date: 05/12/2022 CLINICAL DATA:  Shortness of breath EXAM: PORTABLE CHEST 1 VIEW COMPARISON:  X-ray 01/10/2022  FINDINGS: Left upper chest defibrillator. Heart enlarged. Calcified and tortuous aorta. Mild vascular congestion which is new from previous. Elevated right hemidiaphragm with a tiny right effusion and adjacent atelectasis. No pneumothorax. Overlapping cardiac leads. Osteopenia. IMPRESSION: Developing vascular congestion.  Tiny right effusion. Enlarged heart with calcified aorta and defibrillator Electronically Signed   By: Karen Kays M.D.   On: 05/12/2022 13:55        Scheduled Meds:  apixaban  2.5 mg Oral BID   Chlorhexidine Gluconate Cloth  6 each Topical Daily   metoprolol tartrate       Continuous Infusions:  sodium chloride     norepinephrine (LEVOPHED) Adult infusion Stopped (05/12/22 1631)     LOS: 1 day    Time spent: 35 mins     Charise Killian, MD [facility] Pager [phone]  If 7PM-7AM, please contact night-coverage [email or web address] 05/13/2022, 8:37 AM

## 2022-05-13 NOTE — Progress Notes (Signed)
*  PRELIMINARY RESULTS* Echocardiogram 2D Echocardiogram has been performed.  Carolyne Fiscal 05/13/2022, 10:30 AM

## 2022-05-13 NOTE — Progress Notes (Signed)
Pt transferring from ICU 19 to 2A 23 on RA, unmonitored, accompanied by ICU RN and CNA. Pt belongings sent with pt. Skin verification completed with 2A RN. 1:1 safety observation maintained.

## 2022-05-13 NOTE — Progress Notes (Signed)
Pt with increasing confusion and now agitation. Pt pulling off equipment and becoming combative when attempting to replace. Md notified and IM Haldol ordered and given. Family left. Pt not redirectable at this time. Will continue to monitor, assess and re-orient as needed.

## 2022-05-14 DIAGNOSIS — I509 Heart failure, unspecified: Secondary | ICD-10-CM | POA: Diagnosis not present

## 2022-05-14 DIAGNOSIS — R55 Syncope and collapse: Secondary | ICD-10-CM

## 2022-05-14 DIAGNOSIS — J9601 Acute respiratory failure with hypoxia: Secondary | ICD-10-CM | POA: Diagnosis not present

## 2022-05-14 DIAGNOSIS — Z7189 Other specified counseling: Secondary | ICD-10-CM

## 2022-05-14 DIAGNOSIS — R000 Tachycardia, unspecified: Secondary | ICD-10-CM

## 2022-05-14 DIAGNOSIS — I420 Dilated cardiomyopathy: Secondary | ICD-10-CM | POA: Diagnosis not present

## 2022-05-14 DIAGNOSIS — Z515 Encounter for palliative care: Secondary | ICD-10-CM

## 2022-05-14 DIAGNOSIS — I5023 Acute on chronic systolic (congestive) heart failure: Secondary | ICD-10-CM | POA: Diagnosis not present

## 2022-05-14 DIAGNOSIS — I4891 Unspecified atrial fibrillation: Secondary | ICD-10-CM

## 2022-05-14 LAB — BASIC METABOLIC PANEL
Anion gap: 10 (ref 5–15)
BUN: 25 mg/dL — ABNORMAL HIGH (ref 8–23)
CO2: 25 mmol/L (ref 22–32)
Calcium: 9 mg/dL (ref 8.9–10.3)
Chloride: 103 mmol/L (ref 98–111)
Creatinine, Ser: 1.46 mg/dL — ABNORMAL HIGH (ref 0.61–1.24)
GFR, Estimated: 46 mL/min — ABNORMAL LOW (ref >60)
Glucose, Bld: 99 mg/dL (ref 70–99)
Potassium: 4 mmol/L (ref 3.5–5.1)
Sodium: 138 mmol/L (ref 135–145)

## 2022-05-14 LAB — CBC
HCT: 42.4 % (ref 39.0–52.0)
Hemoglobin: 13.5 g/dL (ref 13.0–17.0)
MCH: 29.7 pg (ref 26.0–34.0)
MCHC: 31.8 g/dL (ref 30.0–36.0)
MCV: 93.2 fL (ref 80.0–100.0)
Platelets: 130 10*3/uL — ABNORMAL LOW (ref 150–400)
RBC: 4.55 MIL/uL (ref 4.22–5.81)
RDW: 15.9 % — ABNORMAL HIGH (ref 11.5–15.5)
WBC: 5.9 10*3/uL (ref 4.0–10.5)
nRBC: 0 % (ref 0.0–0.2)

## 2022-05-14 LAB — URINE CULTURE: Culture: <10000 — AB

## 2022-05-14 LAB — GLUCOSE, CAPILLARY: Glucose-Capillary: 111 mg/dL — ABNORMAL HIGH (ref 70–99)

## 2022-05-14 LAB — MAGNESIUM: Magnesium: 2.1 mg/dL (ref 1.7–2.4)

## 2022-05-14 NOTE — Progress Notes (Addendum)
PHARMACY CONSULT NOTE  Pharmacy Consult for Electrolyte Monitoring and Replacement   Recent Labs: Potassium (mmol/L)  Date Value  05/14/2022 4.0  04/09/2013 4.2   Magnesium (mg/dL)  Date Value  82/95/6213 2.1   Calcium (mg/dL)  Date Value  08/65/7846 9.0   Calcium, Total (mg/dL)  Date Value  96/29/5284 9.3   Albumin (g/dL)  Date Value  13/24/4010 3.4 (L)   Phosphorus (mg/dL)  Date Value  27/25/3664 3.7   Sodium (mmol/L)  Date Value  05/14/2022 138  04/09/2013 140     Assessment: 89 yo male with a PMH of atrial fibrillation,ventricular tachycardia s/p BiV ICD (08/09/2007), HLD, HTN, Dilated Cardiomyopathy, CHF, and CKD presented to [facility] via EMS on 05/2 with c/o shortness of breath. Pharmacy is asked to follow and replace electrolytes while in CCU  Goal of Therapy:  Potassium 4.0 - 5.1 mmol/L Magnesium 2.0 - 2.4 mg/dL All Other Electrolytes WNL  Plan:  -- No replacement needed today. Pt transferred out of the ICU. Pharmacy will sign off. Please re-consult if needed.    Ronnald Ramp ,PharmD Clinical Pharmacist 05/14/2022 10:39 AM

## 2022-05-14 NOTE — Evaluation (Signed)
Physical Therapy Evaluation Patient Details Name: Spencer Coleman MRN: [id number] DOB: [birth year] Today's Date: 05/14/2022  History of Present Illness  Pt is an 89 y.o. male with history of hypertension, hyperlipidemia, CKD, CHF, AICD, work up for acute on chronic systolic CHF exacerbation.   Clinical Impression  Patient alert, sitting EOB eating breakfast. He reported at baseline he lives alone, able to perform his ADLs/IADLs independently, drives, tends to eat out. He was able to perform sit <> stand with CGA and RW, ambulated ~17ft with RW and CGA. Did exhibit increased gait velocity, no LOB noted, pt just a bit impulsive.  Overall the patient demonstrated deficits (see "PT Problem List") that impede the patient's functional abilities, safety, and mobility and would benefit from skilled PT intervention. Recommendation is to continue skilled PT to maximize safety and function.     Recommendations for follow up therapy are one component of a multi-disciplinary discharge planning process, led by the attending physician.  Recommendations may be updated based on patient status, additional functional criteria and insurance authorization.  Follow Up Recommendations       Assistance Recommended at Discharge Intermittent Supervision/Assistance  Patient can return home with the following  Assist for transportation;Direct supervision/assist for medications management    Equipment Recommendations None recommended by PT  Recommendations for Other Services       Functional Status Assessment Patient has had a recent decline in their functional status and demonstrates the ability to make significant improvements in function in a reasonable and predictable amount of time.     Precautions / Restrictions Precautions Precautions: Fall Restrictions Weight Bearing Restrictions: No      Mobility  Bed Mobility Overal bed mobility: Modified Independent             General bed mobility comments: Pt  received eating seated at EOB.    Transfers Overall transfer level: Needs assistance Equipment used: Rolling walker (2 wheels) Transfers: Sit to/from Stand Sit to Stand: Min guard                Ambulation/Gait Ambulation/Gait assistance: Min guard Gait Distance (Feet): 100 Feet Assistive device: Rolling walker (2 wheels)         General Gait Details: some flexed trunk, pt with BUE, some increased cadence noted, education on activity pacing  Stairs            Wheelchair Mobility    Modified Rankin (Stroke Patients Only)       Balance Overall balance assessment: Needs assistance Sitting-balance support: Feet supported Sitting balance-Leahy Scale: Good       Standing balance-Leahy Scale: Fair                               Pertinent Vitals/Pain Pain Assessment Pain Assessment: No/denies pain    Home Living Family/patient expects to be discharged to:: Private residence Living Arrangements: Alone Available Help at Discharge: Family;Available PRN/intermittently (sister; pt states he has two local sons) Type of Home: House Home Access: Stairs to enter Entrance Stairs-Rails: Can reach both Entrance Stairs-Number of Steps: 1   Home Layout: One level Home Equipment: Grab bars - toilet;Rollator (4 wheels)      Prior Function Prior Level of Function : Independent/Modified Independent             Mobility Comments: uses rollator. Pt denies falls. ADLs Comments: Pt states he is (I) with ADLs/IADLs, drives, grocery shops. Past medical record shows family with  concerns about this. 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     Hand Dominance   Dominant Hand: Right    Extremity/Trunk Assessment   Upper Extremity Assessment Upper Extremity Assessment: Overall WFL for tasks assessed    Lower Extremity Assessment Lower Extremity Assessment: Overall WFL for tasks assessed    Cervical / Trunk Assessment Cervical / Trunk Assessment: Normal   Communication   Communication: No difficulties  Cognition Arousal/Alertness: Awake/alert Behavior During Therapy: WFL for tasks assessed/performed Overall Cognitive Status: Within Functional Limits for tasks assessed                                 General Comments: Pt is alert and oriented grossly to place ("hospital") and time ("May 2024" didn't know the exact date).        General Comments General comments (skin integrity, edema, etc.): Pt on 1.5L, but taken off O2 and pt saturation 98% on room air. Intermittent telemetry alerts for vetricular fibrillation. pt walked in hallway aapprox 3 minutes at RW CGA.    Exercises     Assessment/Plan    PT Assessment Patient needs continued PT services  PT Problem List Decreased mobility;Decreased activity tolerance;Decreased balance       PT Treatment Interventions DME instruction;Therapeutic activities;Gait training;Therapeutic exercise;Patient/family education;Stair training;Balance training;Functional mobility training;Neuromuscular re-education    PT Goals (Current goals can be found in the Care Plan section)  Acute Rehab PT Goals Patient Stated Goal: to go home PT Goal Formulation: With patient Time For Goal Achievement: 05/28/22 Potential to Achieve Goals: Good    Frequency Min 2X/week     Co-evaluation PT/OT/SLP Co-Evaluation/Treatment: Yes Reason for Co-Treatment: For patient/therapist safety PT goals addressed during session: Mobility/safety with mobility;Proper use of DME OT goals addressed during session: ADL's and self-care       AM-PAC PT "6 Clicks" Mobility  Outcome Measure Help needed turning from your back to your side while in a flat bed without using bedrails?: None Help needed moving from lying on your back to sitting on the side of a flat bed without using bedrails?: None Help needed moving to and from a bed to a chair (including a wheelchair)?: None Help needed standing up from a chair  using your arms (e.g., wheelchair or bedside chair)?: None Help needed to walk in hospital room?: A Little Help needed climbing 3-5 steps with a railing? : A Little 6 Click Score: 22    End of Session Equipment Utilized During Treatment: Gait belt Activity Tolerance: Patient tolerated treatment well Patient left: in chair;with call bell/phone within reach;with chair alarm set Nurse Communication: Mobility status PT Visit Diagnosis: Other abnormalities of gait and mobility (R26.89);Muscle weakness (generalized) (M62.81)    Time: 1610-9604 PT Time Calculation (min) (ACUTE ONLY): 18 min   Charges:   PT Evaluation $PT Eval Low Complexity: 1 Low          Olga Coaster PT, DPT 12:59 PM,05/14/22

## 2022-05-14 NOTE — Progress Notes (Signed)
SUBJECTIVE: Patient is's sitting up in bed without any chest pain shortness of breath but says he occasionally feels dizzy.  Vitals:   05/13/22 2200 05/13/22 2313 05/14/22 0317 05/14/22 0727  BP: 115/83   108/67  Pulse: 70   69  Resp: 18   12  Temp:  (!) 96.3 F (35.7 C) (!) 96.5 F (35.8 C) (!) 97.4 F (36.3 C)  TempSrc:  Axillary Axillary Axillary  SpO2: 100%   (!) 70%  Weight:      Height:        Intake/Output Summary (Last 24 hours) at 05/14/2022 1036 Last data filed at 05/14/2022 0643 Gross per 24 hour  Intake 191.05 ml  Output 450 ml  Net -258.95 ml    LABS: Basic Metabolic Panel: Recent Labs    05/12/22 1715 05/13/22 0407 05/14/22 0411  NA 140 140 138  K 3.8 3.5 4.0  CL 103 105 103  CO2 25 26 25   GLUCOSE 135* 119* 99  BUN 26* 26* 25*  CREATININE 1.73* 1.69* 1.46*  CALCIUM 8.9 8.6* 9.0  MG 2.2 2.1 2.1  PHOS 4.2 3.7  --    Liver Function Tests: Recent Labs    05/12/22 1715  AST 28  ALT 16  ALKPHOS 89  BILITOT 2.2*  PROT 5.7*  ALBUMIN 3.4*   No results for input(s): "LIPASE", "AMYLASE" in the last 72 hours. CBC: Recent Labs    05/12/22 1338 05/13/22 0407 05/14/22 0411  WBC 5.9 6.5 5.9  NEUTROABS 3.6  --   --   HGB 13.2 12.4* 13.5  HCT 42.7 39.3 42.4  MCV 96.0 93.3 93.2  PLT 165 141* 130*   Cardiac Enzymes: No results for input(s): "CKTOTAL", "CKMB", "CKMBINDEX", "TROPONINI" in the last 72 hours. BNP: Invalid input(s): "POCBNP" D-Dimer: No results for input(s): "DDIMER" in the last 72 hours. Hemoglobin A1C: No results for input(s): "HGBA1C" in the last 72 hours. Fasting Lipid Panel: No results for input(s): "CHOL", "HDL", "LDLCALC", "TRIG", "CHOLHDL", "LDLDIRECT" in the last 72 hours. Thyroid Function Tests: No results for input(s): "TSH", "T4TOTAL", "T3FREE", "THYROIDAB" in the last 72 hours.  Invalid input(s): "FREET3" Anemia Panel: No results for input(s): "VITAMINB12", "FOLATE", "FERRITIN", "TIBC", "IRON", "RETICCTPCT" in the last  72 hours.   PHYSICAL EXAM General: Well developed, well nourished, in no acute distress HEENT:  Normocephalic and atramatic Neck:  No JVD.  Lungs: Clear bilaterally to auscultation and percussion. Heart: HRRR . Normal S1 and S2 without gallops or murmurs.  Abdomen: Bowel sounds are positive, abdomen soft and non-tender  Msk:  Back normal, normal gait. Normal strength and tone for age. Extremities: No clubbing, cyanosis or edema.   Neuro: Alert and oriented X 3. Psych:  Good affect, responds appropriately  TELEMETRY: Currently patient is not on monitor  ASSESSMENT AND PLAN: Congestive heart failure with HFrEF with echocardiogram showing severe LV dysfunction with left ventricular ejection fraction 25% and severe mitral regurgitation and moderate aortic regurgitation.  Patient has been hypotensive and creatinine is 1.7 thus unable to start the patient on Entresto or carvedilol at this time.  Continue Lasix and amiodarone p.o.  Will need further workup for severe mitral regurgitation possibly right and left heart catheterization once stable.  Also will need to start either ACE inhibitors or Entresto along with beta-blockers as blood pressure improves.   ICD-10-CM   1. Acute on chronic congestive heart failure, unspecified heart failure type (HCC)  I50.9     2. Wide-complex tachycardia  R00.0  3. Hypotension, unspecified hypotension type  I95.9       Principal Problem:   Acute hypoxic respiratory failure [facility])    Adrian Blackwater, MD, [facility] 05/14/2022 10:36 AM

## 2022-05-14 NOTE — Consult Note (Signed)
Consultation Note Date: 05/14/2022   Patient Name: Spencer Coleman  DOB: 12/25/1930  MRN: [id number]  Age / Sex: 89 y.o., male  PCP: Barbette Reichmann, MD Referring Physician: Charise Killian, MD  Reason for Consultation: Establishing goals of care   HPI/Brief Hospital Course: 89 y.o. male  with past medical history of PAF, Ventricular Tachycardia s/p BiV ICD (2009), HLD, HTN, dilated cardiomyopathy, chronic systolic heart failure and CKD admitted from home on 05/12/2022 with complaints of shortness of breath. EMS was called by sister as she was concerned about ongoing weakness and possible UTI being managed as outpatient.  On arrival to ED, found to be in wide-complex tachycardia (140's) with soft SBP (80-90's). BNP >3,000 and CXR concerning for vascular congestion. Started on amiodarone infusion, initially admitted to ICU due to concern for needing vasopressor support given hypotension-vasopressors were not initiated   Transitioned to PO amiodarone 5/3, tolerating well  Episodes of confusion, AMS, agitation throughout hospitalization, mentation seems to wax and wane  Palliative medicine was consulted for assisting with goals of care conversations.  Subjective:  Extensive chart review has been completed prior to meeting patient including labs, vital signs, imaging, progress notes, orders, and available advanced directive documents from current and previous encounters.  Introduced myself as a Publishing rights manager as a member of the palliative care team. Explained palliative medicine is specialized medical care for people living with serious illness. It focuses on providing relief from the symptoms and stress of a serious illness. The goal is to improve quality of life for both the patient and the family.   Visited with Mr. Whan at his bedside. Sitter at bedside for safety. Ms. Sperandio sitting up in chair, remains pleasantly confused. Attempted to have Mr. Nordland  explain family dynamics as I questioned who he has appointed at [employer]. Mr. Zander shares he has appointed his daughter-Wendy as his HCPOA. [name] not listed as a contact in chart, Mr. Ravens unable to share her number as he only has it written down at home.  Later in day, called to bedside as [name] was visiting. [name] shares she has a complicated relationship with her father Mr. Sterbenz. She is not able to provide much insight into his past medical history. She does share he was living independently at his home. He has been divorced from his wife for many years. Had a long term partner that passed away about 3 years ago. 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  [name] shares she talks with Mr. Sedano on the phone once per week but there will be longer stretches of time when they do not speak for several weeks. She has noticed over the last several months when they do speak on the phone he is sharing the same stories and repeating himself quite a bit. Mr. Bassani has a son that lives close but does not have contact with him. All other family is out of town.  We discussed possibility of underlying memory loss or dementia at baseline that could possibly go undetected as Mr. Dillingham lives independently. Discussed possibility of underlying memory loss being exacerbated by acute illness and hospitalization. Discussed possibility of mentation improving or remaining as is.  [name] shares she communicates with her sister and they together would make decisions for Mr. Aber.  Discussed Code Status as being of most concern-Full Code versus Do Not Resuscitate. Encouraged family to consider DNR/DNI status understanding evidenced based poor outcomes in similar hospitalized patients, as the cause of the  arrest is likely associated with chronic/terminal disease rather than a reversible acute cardio-pulmonary event.  [name] voices understanding and shares she would like a chance to speak with her  sister.  [name] shares she has been contacted by a Child psychotherapist outside of the hospital that has been visiting Mr. Laible in his home with concerns of him being able to care for himself. [name] is open to Mr. Wager being placed into a facility if needed or able.  I discussed importance of continued conversations with family/support persons and all members of their medical team regarding overall plan of care and treatment options ensuring decisions are in alignment with patients goals of care.  All questions/concerns addressed. PMT will continue to follow and support patient as needed.  Objective: Primary Diagnoses: Present on Admission:  Acute hypoxic respiratory failure (HCC)   Vital Signs: BP 116/78 (BP Location: Left Arm)   Pulse 69   Temp (!) 97.4 F (36.3 C) (Axillary)   Resp 17   Ht 5\' 10"  (1.778 m)   Wt 69.7 kg   SpO2 (!) 70%   BMI 22.05 kg/m  Pain Scale: 0-10   Pain Score: 0-No pain   LBM: Last BM Date :  (PTA) Baseline Weight: Weight: 65.8 kg Most recent weight: Weight: 69.7 kg      Assessment and Plan  SUMMARY OF RECOMMENDATIONS   Full Code Recommend ongoing GOC conversations amongst family PMT to continue to follow for ongoing needs and support  Discussed With: Primary team   Thank you for this consult and allowing Palliative Medicine to participate in the care of Ojani Farnam. Levey. Palliative medicine will continue to follow and assist as needed.   Time Total: 75 minutes  Time spent includes: Detailed review of medical records (labs, imaging, vital signs), medically appropriate exam (mental status, respiratory, cardiac, skin), discussed with treatment team, counseling and educating patient, family and staff, documenting clinical information, medication management and coordination of care.   Signed by: Leeanne Deed, DNP, AGNP-C Palliative Medicine    Please contact Palliative Medicine Team phone at [phone] for questions and concerns.  For individual  provider: See Loretha Stapler

## 2022-05-14 NOTE — Progress Notes (Addendum)
PROGRESS NOTE    Spencer Coleman  UJW:[id number] DOB: Apr 23, 1930 DOA: 05/12/2022 PCP: Barbette Reichmann, MD    Assessment & Plan:   Principal Problem:   Acute hypoxic respiratory failure (HCC)  Assessment and Plan: Acute hypoxic respiratory failure: likely secondary to CHF exacerbation. Continue on supplemental oxygen and wean as tolerated. Viral respiratory panel is neg   Acute on chronic systolic CHF exacerbation: continue on IV lasix. Monitor I/Os. BNP 3,155. Cardio following and recs apprec   Dilated cardiomyopathy: w/ hx of ventricular tachycardia s/p AICD placement. Continue on po amiodarone as per cardio. Keep MAP >65. Continue on tele. Cardio following and recs apprec   PAF: continue on amio as per cardio. D/c IV amio. Continue on tele. Cardio following and recs apprec   HTN: holding home dose of metoprolol as per BP is on low end of normal   HLD: continue on statin   CKDIIIa: Cr is trending down from day prior. Avoid nephrotoxic meds  Thrombocytopenia: etiology unclear. Will continue to monitor   Poor short memory: unknown if pt has formal dx of dementia or not. Re-orient prn. Continue w/ sitter.    DVT prophylaxis: eliquis  Code Status: full  Family Communication: called pt's sister, [name], and answered her questions. [name] (pt's daughter), [place], [phone] & [phone]. Tresa Endo (other daughter) [phone]. Outpatient CM Sharlene Motts [phone].  Disposition Plan:  PT/OT recs home health. Pt is unsafe to stay at home alone.   Level of care: Progressive  Status is: Inpatient Remains inpatient appropriate because: severity of illness    Consultants:  Cardio   Procedures:   Antimicrobials:   Subjective: Pt is pleasantly is confused   Objective: Vitals:   05/13/22 2200 05/13/22 2313 05/14/22 0317 05/14/22 0727  BP: 115/83   108/67  Pulse: 70   69  Resp: 18   12  Temp:  (!) 96.3 F (35.7 C) (!) 96.5 F (35.8 C) (!) 97.4 F (36.3 C)  TempSrc:   Axillary Axillary Axillary  SpO2: 100%   (!) 70%  Weight:      Height:        Intake/Output Summary (Last 24 hours) at 05/14/2022 0832 Last data filed at 05/14/2022 4742 Gross per 24 hour  Intake 361.02 ml  Output 450 ml  Net -88.98 ml   Filed Weights   05/12/22 1332 05/12/22 1710 05/13/22 0500  Weight: 65.8 kg 70.9 kg 69.7 kg    Examination:  General exam: Appears comfortable  Respiratory system: decreased breath sounds b/l  Cardiovascular system: irregularly irregular.  Gastrointestinal system: Abd is soft, NT, ND & normal bowel sounds Central nervous system: alert and awake. Moves all extremities  Psychiatry: Judgement and insight appears poor. Flat mood and affect     Data Reviewed: I have personally reviewed following labs and imaging studies  CBC: Recent Labs  Lab 05/12/22 1338 05/13/22 0407 05/14/22 0411  WBC 5.9 6.5 5.9  NEUTROABS 3.6  --   --   HGB 13.2 12.4* 13.5  HCT 42.7 39.3 42.4  MCV 96.0 93.3 93.2  PLT 165 141* 130*   Basic Metabolic Panel: Recent Labs  Lab 05/12/22 1715 05/13/22 0407 05/14/22 0411  NA 140 140 138  K 3.8 3.5 4.0  CL 103 105 103  CO2 25 26 25   GLUCOSE 135* 119* 99  BUN 26* 26* 25*  CREATININE 1.73* 1.69* 1.46*  CALCIUM 8.9 8.6* 9.0  MG 2.2 2.1 2.1  PHOS 4.2 3.7  --    GFR: Estimated  Creatinine Clearance: 33.8 mL/min (A) (by C-G formula based on SCr of 1.46 mg/dL (H)). Liver Function Tests: Recent Labs  Lab 05/12/22 1715  AST 28  ALT 16  ALKPHOS 89  BILITOT 2.2*  PROT 5.7*  ALBUMIN 3.4*   No results for input(s): "LIPASE", "AMYLASE" in the last 168 hours. No results for input(s): "AMMONIA" in the last 168 hours. Coagulation Profile: No results for input(s): "INR", "PROTIME" in the last 168 hours. Cardiac Enzymes: No results for input(s): "CKTOTAL", "CKMB", "CKMBINDEX", "TROPONINI" in the last 168 hours. BNP (last 3 results) No results for input(s): "PROBNP" in the last 8760 hours. HbA1C: No results for  input(s): "HGBA1C" in the last 72 hours. CBG: Recent Labs  Lab 05/12/22 1714  GLUCAP 135*   Lipid Profile: No results for input(s): "CHOL", "HDL", "LDLCALC", "TRIG", "CHOLHDL", "LDLDIRECT" in the last 72 hours. Thyroid Function Tests: No results for input(s): "TSH", "T4TOTAL", "FREET4", "T3FREE", "THYROIDAB" in the last 72 hours. Anemia Panel: No results for input(s): "VITAMINB12", "FOLATE", "FERRITIN", "TIBC", "IRON", "RETICCTPCT" in the last 72 hours. Sepsis Labs: Recent Labs  Lab 05/12/22 1715 05/13/22 0407  PROCALCITON <0.10  --   LATICACIDVEN 2.9* 1.7    Recent Results (from the past 240 hour(s))  SARS Coronavirus 2 by RT PCR (hospital order, performed in [facility] hospital lab) *cepheid single result test* Anterior Nasal Swab     Status: None   Collection Time: 05/12/22  3:54 PM   Specimen: Anterior Nasal Swab  Result Value Ref Range Status   SARS Coronavirus 2 by RT PCR NEGATIVE NEGATIVE Final    Comment: (NOTE) SARS-CoV-2 target nucleic acids are NOT DETECTED.  The SARS-CoV-2 RNA is generally detectable in upper and lower respiratory specimens during the acute phase of infection. The lowest concentration of SARS-CoV-2 viral copies this assay can detect is 250 copies / mL. A negative result does not preclude SARS-CoV-2 infection and should not be used as the sole basis for treatment or other patient management decisions.  A negative result may occur with improper specimen collection / handling, submission of specimen other than nasopharyngeal swab, presence of viral mutation(s) within the areas targeted by this assay, and inadequate number of viral copies (<250 copies / mL). A negative result must be combined with clinical observations, patient history, and epidemiological information.  Fact Sheet for Patients:   RoadLapTop.co.za  Fact Sheet for Healthcare Providers: http://kim-miller.com/  This test is not yet  approved or  cleared by the [place] FDA and has been authorized for detection and/or diagnosis of SARS-CoV-2 by FDA under an Emergency Use Authorization (EUA).  This EUA will remain in effect (meaning this test can be used) for the duration of the COVID-19 declaration under Section 564(b)(1) of the Act, 21 U.S.C. section 360bbb-3(b)(1), unless the authorization is terminated or revoked sooner.  Performed at [facility], [address] [place], [place] [address]   MRSA Next Gen by PCR, Nasal     Status: None   Collection Time: 05/12/22  5:12 PM   Specimen: Nasal Mucosa; Nasal Swab  Result Value Ref Range Status   MRSA by PCR Next Gen NOT DETECTED NOT DETECTED Final    Comment: (NOTE) The GeneXpert MRSA Assay (FDA approved for NASAL specimens only), is one component of a comprehensive MRSA colonization surveillance program. It is not intended to diagnose MRSA infection nor to guide or monitor treatment for MRSA infections. Test performance is not FDA approved in patients less than 26 years old. Performed at  [facility], [address], [place], [place] [address]   Culture, blood (Routine X 2) w Reflex to ID Panel     Status: None (Preliminary result)   Collection Time: 05/12/22  5:33 PM   Specimen: BLOOD  Result Value Ref Range Status   Specimen Description BLOOD BLOOD LEFT ARM  Final   Special Requests   Final    BOTTLES DRAWN AEROBIC AND ANAEROBIC Blood Culture adequate volume   Culture   Final    NO GROWTH 2 DAYS Performed at [facility], [address], [place], [place] [address]    Report Status PENDING  Incomplete  Culture, blood (Routine X 2) w Reflex to ID Panel     Status: None (Preliminary result)   Collection Time: 05/12/22  5:33 PM   Specimen: BLOOD  Result Value Ref Range Status   Specimen Description BLOOD BLOOD RIGHT HAND [facility]  Final   Special Requests   Final    BOTTLES DRAWN AEROBIC AND ANAEROBIC Blood Culture results  may not be optimal due to an inadequate volume of blood received in culture bottles   Culture   Final    NO GROWTH 2 DAYS Performed at [facility], [address] [place], [place] [address]    Report Status PENDING  Incomplete  Respiratory (~20 pathogens) panel by PCR     Status: None   Collection Time: 05/12/22  9:38 PM   Specimen: Nasopharyngeal Swab; Respiratory  Result Value Ref Range Status   Adenovirus NOT DETECTED NOT DETECTED Final   Coronavirus 229E NOT DETECTED NOT DETECTED Final    Comment: (NOTE) The Coronavirus on the Respiratory Panel, DOES NOT test for the novel  Coronavirus (2019 nCoV)    Coronavirus HKU1 NOT DETECTED NOT DETECTED Final   Coronavirus NL63 NOT DETECTED NOT DETECTED Final   Coronavirus OC43 NOT DETECTED NOT DETECTED Final   Metapneumovirus NOT DETECTED NOT DETECTED Final   Rhinovirus / Enterovirus NOT DETECTED NOT DETECTED Final   Influenza A NOT DETECTED NOT DETECTED Final   Influenza B NOT DETECTED NOT DETECTED Final   Parainfluenza Virus 1 NOT DETECTED NOT DETECTED Final   Parainfluenza Virus 2 NOT DETECTED NOT DETECTED Final   Parainfluenza Virus 3 NOT DETECTED NOT DETECTED Final   Parainfluenza Virus 4 NOT DETECTED NOT DETECTED Final   Respiratory Syncytial Virus NOT DETECTED NOT DETECTED Final   Bordetella pertussis NOT DETECTED NOT DETECTED Final   Bordetella Parapertussis NOT DETECTED NOT DETECTED Final   Chlamydophila pneumoniae NOT DETECTED NOT DETECTED Final   Mycoplasma pneumoniae NOT DETECTED NOT DETECTED Final    Comment: Performed at [facility], [address] [place], [place] [address]         Radiology Studies: ECHOCARDIOGRAM COMPLETE  Result Date: 05/13/2022    ECHOCARDIOGRAM REPORT   Patient Name:   KERY CORTS Date of Exam: 05/13/2022 Medical Rec #:  [id number]    Height:       70.0 in Accession #:    [id number]   Weight:       153.7 lb Date of Birth:  10-Sep-1930     BSA:          1.866 m Patient Age:    89  years     BP:           89/71 mmHg Patient Gender: M            HR:  75 bpm. Exam Location:  [facility] Procedure: 2D Echo, Cardiac Doppler, Color Doppler and Strain Analysis Indications:     Abnormal ECG  History:         Patient has prior history of Echocardiogram examinations, most                  recent 06/13/2021. CHF, CAD, Abnormal ECG and Defibrillator,                  Arrythmias:Atrial Fibrillation and Tachycardia; Risk                  Factors:Dyslipidemia and Hypertension.  Sonographer:     Mikki Harbor Referring Phys:  [id number] DANA G NELSON Diagnosing Phys: Adrian Blackwater IMPRESSIONS  1. Left ventricular ejection fraction, by estimation, is 20 to 25%. The left ventricle has severely decreased function. The left ventricle demonstrates global hypokinesis. The left ventricular internal cavity size was severely dilated. There is moderate  left ventricular hypertrophy. Left ventricular diastolic parameters are consistent with Grade II diastolic dysfunction (pseudonormalization).  2. Right ventricular systolic function is moderately reduced. The right ventricular size is moderately enlarged. Mildly increased right ventricular wall thickness. There is normal pulmonary artery systolic pressure.  3. Left atrial size was severely dilated.  4. Right atrial size was severely dilated.  5. The mitral valve is myxomatous. Severe mitral valve regurgitation.  6. Tricuspid valve regurgitation is mild to moderate.  7. The aortic valve is calcified. Aortic valve regurgitation is moderate.  8. Pulmonic valve regurgitation is moderate.  9. Aortic dilatation noted. Conclusion(s)/Recommendation(s): Findings consistent with dilated cardiomyopathy. FINDINGS  Left Ventricle: Left ventricular ejection fraction, by estimation, is 20 to 25%. The left ventricle has severely decreased function. The left ventricle demonstrates global hypokinesis. The left ventricular internal cavity size was severely dilated. There is moderate left  ventricular hypertrophy. Left ventricular diastolic parameters are consistent with Grade II diastolic dysfunction (pseudonormalization). Right Ventricle: The right ventricular size is moderately enlarged. Mildly increased right ventricular wall thickness. Right ventricular systolic function is moderately reduced. There is normal pulmonary artery systolic pressure. The tricuspid regurgitant velocity is 2.61 m/s, and with an assumed right atrial pressure of 8 mmHg, the estimated right ventricular systolic pressure is 35.2 mmHg. Left Atrium: Left atrial size was severely dilated. Right Atrium: Right atrial size was severely dilated. Pericardium: There is no evidence of pericardial effusion. Mitral Valve: The mitral valve is myxomatous. Severe mitral valve regurgitation. MV peak gradient, 2.5 mmHg. The mean mitral valve gradient is 1.0 mmHg. Tricuspid Valve: The tricuspid valve is grossly normal. Tricuspid valve regurgitation is mild to moderate. Aortic Valve: The aortic valve is calcified. Aortic valve regurgitation is moderate. Aortic regurgitation PHT measures 414 msec. Aortic valve mean gradient measures 6.5 mmHg. Aortic valve peak gradient measures 12.7 mmHg. Aortic valve area, by VTI measures 1.91 cm. Pulmonic Valve: The pulmonic valve was grossly normal. Pulmonic valve regurgitation is moderate. Aorta: Aortic dilatation noted. IAS/Shunts: No atrial level shunt detected by color flow Doppler.  LEFT VENTRICLE PLAX 2D LVIDd:         7.00 cm      Diastology LVIDs:         6.50 cm      LV e' medial:    3.16 cm/s LV PW:         0.80 cm      LV E/e' medial:  24.9 LV IVS:        0.90 cm  LV e' lateral:   4.70 cm/s LVOT diam:     2.00 cm      LV E/e' lateral: 16.7 LV SV:         68 LV SV Index:   37 LVOT Area:     3.14 cm  LV Volumes (MOD) LV vol d, MOD A2C: 222.0 ml LV vol d, MOD A4C: 177.0 ml LV vol s, MOD A2C: 165.0 ml LV vol s, MOD A4C: 137.0 ml LV SV MOD A2C:     57.0 ml LV SV MOD A4C:     177.0 ml LV SV MOD  BP:      46.7 ml RIGHT VENTRICLE RV Basal diam:  3.65 cm RV Mid diam:    3.30 cm RV S prime:     10.20 cm/s TAPSE (M-mode): 1.3 cm LEFT ATRIUM              Index        RIGHT ATRIUM           Index LA diam:        5.40 cm  2.89 cm/m   RA Area:     15.60 cm LA Vol (A2C):   93.3 ml  50.00 ml/m  RA Volume:   42.60 ml  22.83 ml/m LA Vol (A4C):   100.0 ml 53.59 ml/m LA Biplane Vol: 103.0 ml 55.19 ml/m  AORTIC VALVE                     PULMONIC VALVE AV Area (Vmax):    2.07 cm      PV Vmax:       0.70 m/s AV Area (Vmean):   1.92 cm      PV Peak grad:  1.9 mmHg AV Area (VTI):     1.91 cm AV Vmax:           178.00 cm/s AV Vmean:          121.500 cm/s AV VTI:            0.358 m AV Peak Grad:      12.7 mmHg AV Mean Grad:      6.5 mmHg LVOT Vmax:         117.50 cm/s LVOT Vmean:        74.300 cm/s LVOT VTI:          0.218 m LVOT/AV VTI ratio: 0.61 AI PHT:            414 msec  AORTA Ao Root diam: 3.40 cm Ao Asc diam:  3.40 cm MITRAL VALVE                  TRICUSPID VALVE MV Area (PHT): 5.27 cm       TR Peak grad:   27.2 mmHg MV Area VTI:   3.05 cm       TR Vmax:        261.00 cm/s MV Peak grad:  2.5 mmHg MV Mean grad:  1.0 mmHg       SHUNTS MV Vmax:       0.79 m/s       Systemic VTI:  0.22 m MV Vmean:      48.4 cm/s      Systemic Diam: 2.00 cm MV Decel Time: 144 msec MR Peak grad:    86.9 mmHg MR Mean grad:    55.0 mmHg MR Vmax:         466.00 cm/s MR Vmean:  349.0 cm/s MR PISA:         4.02 cm MR PISA Eff ROA: 80 mm MR PISA Radius:  0.80 cm MV E velocity: 78.60 cm/s MV A velocity: 56.10 cm/s MV E/A ratio:  1.40 Shaukat Khan Electronically signed by Adrian Blackwater Signature Date/Time: 05/13/2022/12:18:19 PM    Final    US Venous Img Lower Unilateral Right  Result Date: 05/12/2022 CLINICAL DATA:  Leg swelling EXAM: RIGHT LOWER EXTREMITY VENOUS DOPPLER ULTRASOUND TECHNIQUE: Gray-scale sonography with compression, as well as color and duplex ultrasound, were performed to evaluate the deep venous system(s) from the  level of the common femoral vein through the popliteal and proximal calf veins. COMPARISON:  None Available. FINDINGS: VENOUS Normal compressibility of the common femoral, superficial femoral, and popliteal veins, as well as the visualized calf veins. Visualized portions of profunda femoral vein and great saphenous vein unremarkable. No filling defects to suggest DVT on grayscale or color Doppler imaging. Doppler waveforms show normal direction of venous flow, normal respiratory plasticity and response to augmentation. Limited views of the contralateral common femoral vein are unremarkable. OTHER Soft tissue edema of the lower right leg. Limitations: none IMPRESSION: No evidence of right lower extremity DVT. Electronically Signed   By: Allegra Lai M.D.   On: 05/12/2022 16:58   DG Chest Portable 1 View  Result Date: 05/12/2022 CLINICAL DATA:  Shortness of breath EXAM: PORTABLE CHEST 1 VIEW COMPARISON:  X-ray 01/10/2022 FINDINGS: Left upper chest defibrillator. Heart enlarged. Calcified and tortuous aorta. Mild vascular congestion which is new from previous. Elevated right hemidiaphragm with a tiny right effusion and adjacent atelectasis. No pneumothorax. Overlapping cardiac leads. Osteopenia. IMPRESSION: Developing vascular congestion.  Tiny right effusion. Enlarged heart with calcified aorta and defibrillator Electronically Signed   By: Karen Kays M.D.   On: 05/12/2022 13:55        Scheduled Meds:  amiodarone  200 mg Oral BID   apixaban  2.5 mg Oral BID   Chlorhexidine Gluconate Cloth  6 each Topical Daily   furosemide  20 mg Intravenous Daily   simvastatin  40 mg Oral q1800   Continuous Infusions:  sodium chloride       LOS: 2 days    Time spent: 35 mins     Charise Killian, MD [facility] Pager [phone]  If 7PM-7AM, please contact night-coverage [email or web address] 05/14/2022, 8:32 AM

## 2022-05-14 NOTE — Evaluation (Signed)
Occupational Therapy Evaluation Patient Details Name: Spencer Coleman MRN: [id number] DOB: 1930/08/15 Today's Date: 05/14/2022   History of Present Illness Spencer Coleman is a 89 y/o M with PMH including tachycardia, cardiac defibrillator, CKD3, CHF, a-fib. Admitted with respiratory failure after feeling short of breath at home.   Clinical Impression   Patient received for OT evaluation. See flowsheet below for details of function. Generally, patient MOD (I) for bed mobility, CGA with RW for functional mobility, and set up/CGA for ADLs. Patient will benefit from continued OT while in acute care.       Recommendations for follow up therapy are one component of a multi-disciplinary discharge planning process, led by the attending physician.  Recommendations may be updated based on patient status, additional functional criteria and insurance authorization.   Assistance Recommended at Discharge Intermittent Supervision/Assistance  Patient can return home with the following A little help with bathing/dressing/bathroom;A little help with walking and/or transfers;Assistance with cooking/housework;Assist for transportation    Functional Status Assessment  Patient has had a recent decline in their functional status and demonstrates the ability to make significant improvements in function in a reasonable and predictable amount of time.  Equipment Recommendations  None recommended by OT    Recommendations for Other Services       Precautions / Restrictions Precautions Precautions: Fall Restrictions Weight Bearing Restrictions: No      Mobility Bed Mobility Overal bed mobility: Modified Independent             General bed mobility comments: Pt received eating seated at EOB.    Transfers Overall transfer level: Needs assistance Equipment used: Rolling walker (2 wheels) Transfers: Sit to/from Stand Sit to Stand: Min guard                  Balance                                            ADL either performed or assessed with clinical judgement   ADL Overall ADL's : Needs assistance/impaired                                       General ADL Comments: Pt able to make figure four position today for adjusting socks, so likely able to perform LB dressing, however requiring CGA for safety with RW during mobility today; walking at fast pace. UE WFL strength and ROM.     Vision         Perception     Praxis      Pertinent Vitals/Pain Pain Assessment Pain Assessment: No/denies pain     Hand Dominance Right   Extremity/Trunk Assessment Upper Extremity Assessment Upper Extremity Assessment: Overall WFL for tasks assessed   Lower Extremity Assessment Lower Extremity Assessment: Defer to PT evaluation       Communication Communication Communication: No difficulties   Cognition Arousal/Alertness: Awake/alert Behavior During Therapy: WFL for tasks assessed/performed Overall Cognitive Status: Within Functional Limits for tasks assessed                                 General Comments: Pt is alert and oriented grossly to place ("hospital") and time ("May 2024" didn't know the exact date).  General Comments  Pt on 1.5L, but taken off O2 and pt saturation 98% on room air. Intermittent telemetry alerts for vetricular fibrillation. pt walked in hallway aapprox 3 minutes at RW CGA.    Exercises     Shoulder Instructions      Home Living Family/patient expects to be discharged to:: Private residence Living Arrangements: Alone Available Help at Discharge: Family;Available PRN/intermittently (sister; pt states he has two local sons) Type of Home: House Home Access: Stairs to enter [employer] of Steps: 1 Entrance Stairs-Rails: Can reach both Home Layout: One level     Bathroom Shower/Tub: Producer, television/film/video: Handicapped height     Home Equipment: Grab bars -  toilet;Rollator (4 wheels)          Prior Functioning/Environment Prior Level of Function : Independent/Modified Independent             Mobility Comments: uses rollator. Pt denies falls. ADLs Comments: Pt states he is (I) with ADLs/IADLs, drives, grocery shops. Past medical record shows family with concerns about this. 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        OT Problem List: Impaired balance (sitting and/or standing);Decreased activity tolerance      OT Treatment/Interventions: Self-care/ADL training;Therapeutic exercise;Therapeutic activities    OT Goals(Current goals can be found in the care plan section) Acute Rehab OT Goals Patient Stated Goal: Go home OT Goal Formulation: With patient Time For Goal Achievement: 05/28/22 Potential to Achieve Goals: Good ADL Goals Pt Will Perform Grooming: Independently;standing Pt Will Perform Lower Body Bathing: Independently;sit to/from stand Pt Will Perform Lower Body Dressing: Independently;sit to/from stand Pt Will Transfer to Toilet: Independently;ambulating;regular height toilet Pt Will Perform Toileting - Clothing Manipulation and hygiene: Independently;sit to/from stand  OT Frequency: Min 1X/week    Co-evaluation PT/OT/SLP Co-Evaluation/Treatment: Yes Reason for Co-Treatment: For patient/therapist safety   OT goals addressed during session: ADL's and self-care      AM-PAC OT "6 Clicks" Daily Activity     Outcome Measure Help from another person eating meals?: None Help from another person taking care of personal grooming?: None Help from another person toileting, which includes using toliet, bedpan, or urinal?: A Little Help from another person bathing (including washing, rinsing, drying)?: A Little Help from another person to put on and taking off regular upper body clothing?: None Help from another person to put on and taking off regular lower body clothing?: None 6 Click Score: 22   End of Session Equipment  Utilized During Treatment: Rolling walker (2 wheels);Gait belt Nurse Communication: Mobility status  Activity Tolerance: Patient tolerated treatment well Patient left: in chair;with call bell/phone within reach;with chair alarm set  OT Visit Diagnosis: Unsteadiness on feet (R26.81)                Time: 1610-9604 OT Time Calculation (min): 23 min Charges:  OT General Charges $OT Visit: 1 Visit OT Evaluation $OT Eval Moderate Complexity: 1 Mod  Shane Badeaux Junie Panning, MS, OTR/L  Alvester Morin 05/14/2022, 12:32 PM

## 2022-05-15 DIAGNOSIS — I4891 Unspecified atrial fibrillation: Secondary | ICD-10-CM | POA: Diagnosis not present

## 2022-05-15 DIAGNOSIS — R413 Other amnesia: Secondary | ICD-10-CM

## 2022-05-15 DIAGNOSIS — I5043 Acute on chronic combined systolic (congestive) and diastolic (congestive) heart failure: Secondary | ICD-10-CM

## 2022-05-15 DIAGNOSIS — I420 Dilated cardiomyopathy: Secondary | ICD-10-CM | POA: Diagnosis not present

## 2022-05-15 DIAGNOSIS — R55 Syncope and collapse: Secondary | ICD-10-CM | POA: Diagnosis not present

## 2022-05-15 LAB — CBC
HCT: 43 % (ref 39.0–52.0)
Hemoglobin: 13.8 g/dL (ref 13.0–17.0)
MCH: 29.9 pg (ref 26.0–34.0)
MCHC: 32.1 g/dL (ref 30.0–36.0)
MCV: 93.3 fL (ref 80.0–100.0)
Platelets: 116 10*3/uL — ABNORMAL LOW (ref 150–400)
RBC: 4.61 MIL/uL (ref 4.22–5.81)
RDW: 15.9 % — ABNORMAL HIGH (ref 11.5–15.5)
WBC: 5.8 10*3/uL (ref 4.0–10.5)
nRBC: 0 % (ref 0.0–0.2)

## 2022-05-15 LAB — BASIC METABOLIC PANEL
Anion gap: 8 (ref 5–15)
BUN: 28 mg/dL — ABNORMAL HIGH (ref 8–23)
CO2: 29 mmol/L (ref 22–32)
Calcium: 8.7 mg/dL — ABNORMAL LOW (ref 8.9–10.3)
Chloride: 104 mmol/L (ref 98–111)
Creatinine, Ser: 1.57 mg/dL — ABNORMAL HIGH (ref 0.61–1.24)
GFR, Estimated: 42 mL/min — ABNORMAL LOW (ref >60)
Glucose, Bld: 110 mg/dL — ABNORMAL HIGH (ref 70–99)
Potassium: 3.9 mmol/L (ref 3.5–5.1)
Sodium: 141 mmol/L (ref 135–145)

## 2022-05-15 LAB — CULTURE, BLOOD (ROUTINE X 2): Culture: NO GROWTH

## 2022-05-15 MED ORDER — METOPROLOL TARTRATE 5 MG/5ML IV SOLN
5.0000 mg | Freq: Once | INTRAVENOUS | Status: AC
  Administered 2022-05-15: 5 mg via INTRAVENOUS
  Filled 2022-05-15: qty 5

## 2022-05-15 MED ORDER — LORAZEPAM 2 MG/ML IJ SOLN
1.0000 mg | Freq: Four times a day (QID) | INTRAMUSCULAR | Status: DC | PRN
  Administered 2022-05-15 – 2022-05-16 (×3): 1 mg via INTRAVENOUS
  Filled 2022-05-15 (×3): qty 1

## 2022-05-15 MED ORDER — LOSARTAN POTASSIUM 25 MG PO TABS
25.0000 mg | ORAL_TABLET | Freq: Every day | ORAL | Status: DC
  Administered 2022-05-15 – 2022-05-19 (×5): 25 mg via ORAL
  Filled 2022-05-15 (×5): qty 1

## 2022-05-15 MED ORDER — METOPROLOL SUCCINATE ER 25 MG PO TB24
25.0000 mg | ORAL_TABLET | Freq: Every day | ORAL | Status: DC
  Administered 2022-05-15 – 2022-05-19 (×5): 25 mg via ORAL
  Filled 2022-05-15 (×5): qty 1

## 2022-05-15 NOTE — Progress Notes (Signed)
0345 Pt returned to previously observed wide complex tachycardia with rate in the 120's-130's; on call provider notified, cardiology paged again. Pt remains asymptomatic.  0435 new OTO 5mg  metoprolol administered, SBP decreased briefly but MAP remained around 70.  0500 Pt rhythm returned to baseline AV paced at 70 bpm. BP 103/65, MAP 78.

## 2022-05-15 NOTE — Progress Notes (Addendum)
       CROSS COVER NOTE  NAME: BRADDOCK THORNGREN MRN: [id number] DOB : [birth year]    HPI/Events of Note   Report:wide complex tach second event, Dr Welton Flakes paged by nursing, no response  On review of chart:known    Assessment and  Interventions   Assessment: Alert no mentation changes EKG   Wide complex tachycardia with RBBB (not knew) rate 125 Metoprolol 5 mg again given and restored AV paced rhythm       Donnie Mesa NP [facility] Additional episode of wide complex tach responded to additional 5 mg metoprolol. Still awaiting return call from Dr Welton Flakes with cardiology

## 2022-05-15 NOTE — Progress Notes (Signed)
Visited with Spencer Coleman at his bedside, remains pleasantly confused and unable to participate in goals of care conversations. No family at bedside during time of visit. PMT will follow-up 5/6 for ongoing needs and support.  No Charge.  Leeanne Deed, DNP, AGNP-C Palliative Medicine  Please call Palliative Medicine team phone with any questions [phone]. For individual providers please see [facility]ION.

## 2022-05-15 NOTE — Progress Notes (Signed)
Patient attempting to get out of bed multiple times. Bed alarm on but remains very high risk for falls. Tele sitter monitoring ordered per protocol. Called portable equipment to request the tele monitor, no tele monitor available at the moment.No family available today.

## 2022-05-15 NOTE — Progress Notes (Signed)
PROGRESS NOTE    Spencer Coleman  ZOX:[id number] DOB: Aug 18, 1930 DOA: 05/12/2022 PCP: Barbette Reichmann, MD    Assessment & Plan:   Principal Problem:   Acute hypoxic respiratory failure (HCC)  Assessment and Plan: Acute hypoxic respiratory failure: likely secondary to CHF exacerbation. Viral respiratory panel is neg. Weaned off of supplemental oxygen. Resolved   Acute on chronic systolic CHF exacerbation: continue on IV lasix. Monitor I/Os. EF 20-25%, grade II diastolic dysfunction & global hypokinesis.  BNP 3,155. Cardio following and recs apprec   Dilated cardiomyopathy: w/ hx of ventricular tachycardia s/p AICD placement. Continue on amio as per cardio. Keep MAP >65. Continue on tele. Cardio following and recs apprec   PAF: continue on amio, metoprolol & eliquis. Cardio following and recs apprec   HTN: restarted metoprolol. Hold for MAP <65   HLD: continue on statin   CKDIIIa: Cr is labile. Will continue to monitor   Thrombocytopenia: etiology unclear. Will continue to monitor   Poor short memory: unknown if pt has formal dx of dementia or not. Re-orient prn. Continue w/ sitter.    DVT prophylaxis: eliquis  Code Status: full  Family Communication: called pt's daughter, Spencer Coleman & no answer so left a voicemail. Spencer Coleman (pt's daughter), [place], [phone] & [phone]. Spencer Coleman (other daughter) [phone]. Outpatient CM Spencer Coleman [phone].  Disposition Plan:  PT/OT recs home health. Pt is unsafe to stay at home alone.   Level of care: Progressive  Status is: Inpatient Remains inpatient appropriate because: severity of illness    Consultants:  Cardio   Procedures:   Antimicrobials:   Subjective: Pt denies any complaints. Pt is pleasantly confused currently   Objective: Vitals:   05/15/22 0500 05/15/22 0625 05/15/22 0642 05/15/22 0645  BP: 103/65 91/65 98/68  114/70  Pulse: 71 (!) 123 (!) 115 69  Resp: 18 14 16 17   Temp: 97.9 F (36.6 C)     TempSrc: Oral      SpO2: 95% 97% 98% 97%  Weight: 72.7 kg     Height:        Intake/Output Summary (Last 24 hours) at 05/15/2022 0800 Last data filed at 05/14/2022 2146 Gross per 24 hour  Intake 0 ml  Output 500 ml  Net -500 ml   Filed Weights   05/12/22 1710 05/13/22 0500 05/15/22 0500  Weight: 70.9 kg 69.7 kg 72.7 kg    Examination:  General exam: Appears calm & comfortable  Respiratory system: diminished breath sounds b/l  Cardiovascular system: irregularly irregular  Gastrointestinal system: abd is soft, NT, ND & normal bowel sounds  Central nervous system: alert and awake. Moves all extremities  Psychiatry: judgement and insight appears poor. Flat mood and affect      Data Reviewed: I have personally reviewed following labs and imaging studies  CBC: Recent Labs  Lab 05/12/22 1338 05/13/22 0407 05/14/22 0411 05/15/22 0455  WBC 5.9 6.5 5.9 5.8  NEUTROABS 3.6  --   --   --   HGB 13.2 12.4* 13.5 13.8  HCT 42.7 39.3 42.4 43.0  MCV 96.0 93.3 93.2 93.3  PLT 165 141* 130* 116*   Basic Metabolic Panel: Recent Labs  Lab 05/12/22 1715 05/13/22 0407 05/14/22 0411 05/15/22 0455  NA 140 140 138 141  K 3.8 3.5 4.0 3.9  CL 103 105 103 104  CO2 25 26 25 29   GLUCOSE 135* 119* 99 110*  BUN 26* 26* 25* 28*  CREATININE 1.73* 1.69* 1.46* 1.57*  CALCIUM 8.9 8.6* 9.0 8.7*  MG 2.2 2.1 2.1  --   PHOS 4.2 3.7  --   --    GFR: Estimated Creatinine Clearance: 32.8 mL/min (A) (by C-G formula based on SCr of 1.57 mg/dL (H)). Liver Function Tests: Recent Labs  Lab 05/12/22 1715  AST 28  ALT 16  ALKPHOS 89  BILITOT 2.2*  PROT 5.7*  ALBUMIN 3.4*   No results for input(s): "LIPASE", "AMYLASE" in the last 168 hours. No results for input(s): "AMMONIA" in the last 168 hours. Coagulation Profile: No results for input(s): "INR", "PROTIME" in the last 168 hours. Cardiac Enzymes: No results for input(s): "CKTOTAL", "CKMB", "CKMBINDEX", "TROPONINI" in the last 168 hours. BNP (last 3  results) No results for input(s): "PROBNP" in the last 8760 hours. HbA1C: No results for input(s): "HGBA1C" in the last 72 hours. CBG: Recent Labs  Lab 05/12/22 1714 05/14/22 2353  GLUCAP 135* 111*   Lipid Profile: No results for input(s): "CHOL", "HDL", "LDLCALC", "TRIG", "CHOLHDL", "LDLDIRECT" in the last 72 hours. Thyroid Function Tests: No results for input(s): "TSH", "T4TOTAL", "FREET4", "T3FREE", "THYROIDAB" in the last 72 hours. Anemia Panel: No results for input(s): "VITAMINB12", "FOLATE", "FERRITIN", "TIBC", "IRON", "RETICCTPCT" in the last 72 hours. Sepsis Labs: Recent Labs  Lab 05/12/22 1715 05/13/22 0407  PROCALCITON <0.10  --   LATICACIDVEN 2.9* 1.7    Recent Results (from the past 240 hour(s))  SARS Coronavirus 2 by RT PCR (hospital order, performed in [facility] hospital lab) *cepheid single result test* Anterior Nasal Swab     Status: None   Collection Time: 05/12/22  3:54 PM   Specimen: Anterior Nasal Swab  Result Value Ref Range Status   SARS Coronavirus 2 by RT PCR NEGATIVE NEGATIVE Final    Comment: (NOTE) SARS-CoV-2 target nucleic acids are NOT DETECTED.  The SARS-CoV-2 RNA is generally detectable in upper and lower respiratory specimens during the acute phase of infection. The lowest concentration of SARS-CoV-2 viral copies this assay can detect is 250 copies / mL. A negative result does not preclude SARS-CoV-2 infection and should not be used as the sole basis for treatment or other patient management decisions.  A negative result may occur with improper specimen collection / handling, submission of specimen other than nasopharyngeal swab, presence of viral mutation(s) within the areas targeted by this assay, and inadequate number of viral copies (<250 copies / mL). A negative result must be combined with clinical observations, patient history, and epidemiological information.  Fact Sheet for Patients:    RoadLapTop.co.za  Fact Sheet for Healthcare Providers: http://kim-miller.com/  This test is not yet approved or  cleared by the [place] FDA and has been authorized for detection and/or diagnosis of SARS-CoV-2 by FDA under an Emergency Use Authorization (EUA).  This EUA will remain in effect (meaning this test can be used) for the duration of the COVID-19 declaration under Section 564(b)(1) of the Act, 21 U.S.C. section 360bbb-3(b)(1), unless the authorization is terminated or revoked sooner.  Performed at [facility], [address] [place], [place] [address]   MRSA Next Gen by PCR, Nasal     Status: None   Collection Time: 05/12/22  5:12 PM   Specimen: Nasal Mucosa; Nasal Swab  Result Value Ref Range Status   MRSA by PCR Next Gen NOT DETECTED NOT DETECTED Final    Comment: (NOTE) The GeneXpert MRSA Assay (FDA approved for NASAL specimens only), is one component of a comprehensive MRSA colonization surveillance program. It is not intended to diagnose MRSA infection  nor to guide or monitor treatment for MRSA infections. Test performance is not FDA approved in patients less than 44 years old. Performed at [facility], [address] [place], [place] [address]   Culture, blood (Routine X 2) w Reflex to ID Panel     Status: None (Preliminary result)   Collection Time: 05/12/22  5:33 PM   Specimen: BLOOD  Result Value Ref Range Status   Specimen Description BLOOD BLOOD LEFT ARM  Final   Special Requests   Final    BOTTLES DRAWN AEROBIC AND ANAEROBIC Blood Culture adequate volume   Culture   Final    NO GROWTH 3 DAYS Performed at [facility], [address], [place], [place] [address]    Report Status PENDING  Incomplete  Culture, blood (Routine X 2) w Reflex to ID Panel     Status: None (Preliminary result)   Collection Time: 05/12/22  5:33 PM   Specimen: BLOOD  Result Value Ref Range Status    Specimen Description BLOOD BLOOD RIGHT HAND [facility]  Final   Special Requests   Final    BOTTLES DRAWN AEROBIC AND ANAEROBIC Blood Culture results may not be optimal due to an inadequate volume of blood received in culture bottles   Culture   Final    NO GROWTH 3 DAYS Performed at [facility], [address] [place], [place] [address]    Report Status PENDING  Incomplete  Urine Culture     Status: Abnormal   Collection Time: 05/12/22  9:38 PM   Specimen: Urine, Clean Catch  Result Value Ref Range Status   Specimen Description   Final    URINE, CLEAN CATCH Performed at [facility], [address] [place], [place] [address]    Special Requests   Final    NONE Performed at [facility], [address], [place], [place] [address]    Culture (A)  Final    <10,000 COLONIES/mL INSIGNIFICANT GROWTH Performed at [facility], [address] [place], [place] [address]    Report Status 05/14/2022 FINAL  Final  Respiratory (~20 pathogens) panel by PCR     Status: None   Collection Time: 05/12/22  9:38 PM   Specimen: Nasopharyngeal Swab; Respiratory  Result Value Ref Range Status   Adenovirus NOT DETECTED NOT DETECTED Final   Coronavirus 229E NOT DETECTED NOT DETECTED Final    Comment: (NOTE) The Coronavirus on the Respiratory Panel, DOES NOT test for the novel  Coronavirus (2019 nCoV)    Coronavirus HKU1 NOT DETECTED NOT DETECTED Final   Coronavirus NL63 NOT DETECTED NOT DETECTED Final   Coronavirus OC43 NOT DETECTED NOT DETECTED Final   Metapneumovirus NOT DETECTED NOT DETECTED Final   Rhinovirus / Enterovirus NOT DETECTED NOT DETECTED Final   Influenza A NOT DETECTED NOT DETECTED Final   Influenza B NOT DETECTED NOT DETECTED Final   Parainfluenza Virus 1 NOT DETECTED NOT DETECTED Final   Parainfluenza Virus 2 NOT DETECTED NOT DETECTED Final   Parainfluenza Virus 3 NOT DETECTED NOT DETECTED Final   Parainfluenza Virus 4 NOT DETECTED NOT  DETECTED Final   Respiratory Syncytial Virus NOT DETECTED NOT DETECTED Final   Bordetella pertussis NOT DETECTED NOT DETECTED Final   Bordetella Parapertussis NOT DETECTED NOT DETECTED Final   Chlamydophila pneumoniae NOT DETECTED NOT DETECTED Final   Mycoplasma pneumoniae NOT DETECTED NOT DETECTED Final    Comment: Performed at [facility], [address] [place],  [place] [address]         Radiology Studies: ECHOCARDIOGRAM COMPLETE  Result Date: 05/13/2022    ECHOCARDIOGRAM REPORT   Patient Name:   Spencer BLOMBERG Date of Exam: 05/13/2022 Medical Rec #:  [id number]    Height:       70.0 in Accession #:    [id number]   Weight:       153.7 lb Date of Birth:  06/23/1930     BSA:          1.866 m Patient Age:    89 years     BP:           89/71 mmHg Patient Gender: M            HR:           75 bpm. Exam Location:  [facility] Procedure: 2D Echo, Cardiac Doppler, Color Doppler and Strain Analysis Indications:     Abnormal ECG  History:         Patient has prior history of Echocardiogram examinations, most                  recent 06/13/2021. CHF, CAD, Abnormal ECG and Defibrillator,                  Arrythmias:Atrial Fibrillation and Tachycardia; Risk                  Factors:Dyslipidemia and Hypertension.  Sonographer:     Mikki Harbor Referring Phys:  [id number] DANA G NELSON Diagnosing Phys: Adrian Blackwater IMPRESSIONS  1. Left ventricular ejection fraction, by estimation, is 20 to 25%. The left ventricle has severely decreased function. The left ventricle demonstrates global hypokinesis. The left ventricular internal cavity size was severely dilated. There is moderate  left ventricular hypertrophy. Left ventricular diastolic parameters are consistent with Grade II diastolic dysfunction (pseudonormalization).  2. Right ventricular systolic function is moderately reduced. The right ventricular size is moderately enlarged. Mildly increased right ventricular wall thickness. There is normal pulmonary artery systolic  pressure.  3. Left atrial size was severely dilated.  4. Right atrial size was severely dilated.  5. The mitral valve is myxomatous. Severe mitral valve regurgitation.  6. Tricuspid valve regurgitation is mild to moderate.  7. The aortic valve is calcified. Aortic valve regurgitation is moderate.  8. Pulmonic valve regurgitation is moderate.  9. Aortic dilatation noted. Conclusion(s)/Recommendation(s): Findings consistent with dilated cardiomyopathy. FINDINGS  Left Ventricle: Left ventricular ejection fraction, by estimation, is 20 to 25%. The left ventricle has severely decreased function. The left ventricle demonstrates global hypokinesis. The left ventricular internal cavity size was severely dilated. There is moderate left ventricular hypertrophy. Left ventricular diastolic parameters are consistent with Grade II diastolic dysfunction (pseudonormalization). Right Ventricle: The right ventricular size is moderately enlarged. Mildly increased right ventricular wall thickness. Right ventricular systolic function is moderately reduced. There is normal pulmonary artery systolic pressure. The tricuspid regurgitant velocity is 2.61 m/s, and with an assumed right atrial pressure of 8 mmHg, the estimated right ventricular systolic pressure is 35.2 mmHg. Left Atrium: Left atrial size was severely dilated. Right Atrium: Right atrial size was severely dilated. Pericardium: There is no evidence of pericardial effusion. Mitral Valve: The mitral valve is myxomatous. Severe mitral valve regurgitation. MV peak gradient, 2.5 mmHg. The mean mitral valve gradient is 1.0 mmHg. Tricuspid Valve: The tricuspid valve is grossly normal. Tricuspid valve regurgitation is mild to moderate. Aortic Valve: The aortic valve is calcified. Aortic valve regurgitation is  moderate. Aortic regurgitation PHT measures 414 msec. Aortic valve mean gradient measures 6.5 mmHg. Aortic valve peak gradient measures 12.7 mmHg. Aortic valve area, by VTI  measures 1.91 cm. Pulmonic Valve: The pulmonic valve was grossly normal. Pulmonic valve regurgitation is moderate. Aorta: Aortic dilatation noted. IAS/Shunts: No atrial level shunt detected by color flow Doppler.  LEFT VENTRICLE PLAX 2D LVIDd:         7.00 cm      Diastology LVIDs:         6.50 cm      LV e' medial:    3.16 cm/s LV PW:         0.80 cm      LV E/e' medial:  24.9 LV IVS:        0.90 cm      LV e' lateral:   4.70 cm/s LVOT diam:     2.00 cm      LV E/e' lateral: 16.7 LV SV:         68 LV SV Index:   37 LVOT Area:     3.14 cm  LV Volumes (MOD) LV vol d, MOD A2C: 222.0 ml LV vol d, MOD A4C: 177.0 ml LV vol s, MOD A2C: 165.0 ml LV vol s, MOD A4C: 137.0 ml LV SV MOD A2C:     57.0 ml LV SV MOD A4C:     177.0 ml LV SV MOD BP:      46.7 ml RIGHT VENTRICLE RV Basal diam:  3.65 cm RV Mid diam:    3.30 cm RV S prime:     10.20 cm/s TAPSE (M-mode): 1.3 cm LEFT ATRIUM              Index        RIGHT ATRIUM           Index LA diam:        5.40 cm  2.89 cm/m   RA Area:     15.60 cm LA Vol (A2C):   93.3 ml  50.00 ml/m  RA Volume:   42.60 ml  22.83 ml/m LA Vol (A4C):   100.0 ml 53.59 ml/m LA Biplane Vol: 103.0 ml 55.19 ml/m  AORTIC VALVE                     PULMONIC VALVE AV Area (Vmax):    2.07 cm      PV Vmax:       0.70 m/s AV Area (Vmean):   1.92 cm      PV Peak grad:  1.9 mmHg AV Area (VTI):     1.91 cm AV Vmax:           178.00 cm/s AV Vmean:          121.500 cm/s AV VTI:            0.358 m AV Peak Grad:      12.7 mmHg AV Mean Grad:      6.5 mmHg LVOT Vmax:         117.50 cm/s LVOT Vmean:        74.300 cm/s LVOT VTI:          0.218 m LVOT/AV VTI ratio: 0.61 AI PHT:            414 msec  AORTA Ao Root diam: 3.40 cm Ao Asc diam:  3.40 cm MITRAL VALVE  TRICUSPID VALVE MV Area (PHT): 5.27 cm       TR Peak grad:   27.2 mmHg MV Area VTI:   3.05 cm       TR Vmax:        261.00 cm/s MV Peak grad:  2.5 mmHg MV Mean grad:  1.0 mmHg       SHUNTS MV Vmax:       0.79 m/s       Systemic VTI:  0.22  m MV Vmean:      48.4 cm/s      Systemic Diam: 2.00 cm MV Decel Time: 144 msec MR Peak grad:    86.9 mmHg MR Mean grad:    55.0 mmHg MR Vmax:         466.00 cm/s MR Vmean:        349.0 cm/s MR PISA:         4.02 cm MR PISA Eff ROA: 80 mm MR PISA Radius:  0.80 cm MV E velocity: 78.60 cm/s MV A velocity: 56.10 cm/s MV E/A ratio:  1.40 Shaukat Khan Electronically signed by Adrian Blackwater Signature Date/Time: 05/13/2022/12:18:19 PM    Final         Scheduled Meds:  amiodarone  200 mg Oral BID   apixaban  2.5 mg Oral BID   furosemide  20 mg Intravenous Daily   simvastatin  40 mg Oral q1800   Continuous Infusions:  sodium chloride       LOS: 3 days    Time spent: 30 mins     Charise Killian, MD [facility] Pager [phone]  If 7PM-7AM, please contact night-coverage [email or web address] 05/15/2022, 8:00 AM

## 2022-05-15 NOTE — Progress Notes (Signed)
1610 Pt rhythm returned to previously noted wide complex tachycardia, on call provider notified  [phone] OTO 5mg  IV metoprolol administered as per order  0645 Pt returned to baseline; AV paced @ 70 bpm. Pt awake & joking with staff. Continues to deny any symptoms.

## 2022-05-15 NOTE — Progress Notes (Signed)
At approximately 2345 pt was noted to have a wide complex tachycardia on the monitor, HR in the 120's-130's. Pt asleep upon entering his room; when awakened, pt denied any CP, palpitations, SOB, dizziness, etc. On call provider notified, cardiology paged. EKGs obtained, rate remained in the 120's-130's, SBP noted to be in the 90's-110's and MAP remained in the 70's-80's.  At 0050 OTO 5 mg IV metoprolol administered slowly. Pt remained asymptomatic, SBP stayed in the 90's and MAP in the 70's-80's. At 0112, pt rhythm was noted to be back to baseline, AV paced at 70. Pt now asleep, most recent BP/HR are 121/74 (90), 70. On call provider updated in real time on pt developments; still awaiting callback from cardiology.

## 2022-05-15 NOTE — Progress Notes (Signed)
       CROSS COVER NOTE  NAME: Spencer Coleman MRN: [id number] DOB : Jul 13, 1930    HPI/Events of Note   Report:wide complex tachycardia without symptoms and stable BP Dr Welton Flakes paged x 2 without response    On review of chart:history of heart failure with reduced EF 25 % known wide complex tachycardia, and valvular heart disease admitted with decompensation of his heart failure of hypotension and decreased reanal function potentiate need to hold BB, ACE and entresto    Assessment and  Interventions   Assessment: EKG   Wide complex tachycardia with RBBB (not knew) abnormal T waves anterior leads likely  - NO ST elevation QTc 609 Patient without chest pain or complaints BP with MAP 70-80s  Plan: Metoprolol 5 mg x 1 slow IV push - AV paced rhythm at 70 returned.        Donnie Mesa NP [facility]

## 2022-05-15 NOTE — Progress Notes (Signed)
SUBJECTIVE: Patient is sitting on chair comfortable denies any chest pain or shortness of breath or palpitation   Vitals:   05/15/22 0625 05/15/22 0642 05/15/22 0645 05/15/22 0807  BP: 91/65 98/68 114/70 124/85  Pulse: (!) 123 (!) 115 69 77  Resp: 14 16 17 15   Temp:    97.8 F (36.6 C)  TempSrc:    Oral  SpO2: 97% 98% 97% 97%  Weight:      Height:        Intake/Output Summary (Last 24 hours) at 05/15/2022 0932 Last data filed at 05/14/2022 2146 Gross per 24 hour  Intake 0 ml  Output 500 ml  Net -500 ml    LABS: Basic Metabolic Panel: Recent Labs    05/12/22 1715 05/13/22 0407 05/14/22 0411 05/15/22 0455  NA 140 140 138 141  K 3.8 3.5 4.0 3.9  CL 103 105 103 104  CO2 25 26 25 29   GLUCOSE 135* 119* 99 110*  BUN 26* 26* 25* 28*  CREATININE 1.73* 1.69* 1.46* 1.57*  CALCIUM 8.9 8.6* 9.0 8.7*  MG 2.2 2.1 2.1  --   PHOS 4.2 3.7  --   --    Liver Function Tests: Recent Labs    05/12/22 1715  AST 28  ALT 16  ALKPHOS 89  BILITOT 2.2*  PROT 5.7*  ALBUMIN 3.4*   No results for input(s): "LIPASE", "AMYLASE" in the last 72 hours. CBC: Recent Labs    05/12/22 1338 05/13/22 0407 05/14/22 0411 05/15/22 0455  WBC 5.9   < > 5.9 5.8  NEUTROABS 3.6  --   --   --   HGB 13.2   < > 13.5 13.8  HCT 42.7   < > 42.4 43.0  MCV 96.0   < > 93.2 93.3  PLT 165   < > 130* 116*   < > = values in this interval not displayed.   Cardiac Enzymes: No results for input(s): "CKTOTAL", "CKMB", "CKMBINDEX", "TROPONINI" in the last 72 hours. BNP: Invalid input(s): "POCBNP" D-Dimer: No results for input(s): "DDIMER" in the last 72 hours. Hemoglobin A1C: No results for input(s): "HGBA1C" in the last 72 hours. Fasting Lipid Panel: No results for input(s): "CHOL", "HDL", "LDLCALC", "TRIG", "CHOLHDL", "LDLDIRECT" in the last 72 hours. Thyroid Function Tests: No results for input(s): "TSH", "T4TOTAL", "T3FREE", "THYROIDAB" in the last 72 hours.  Invalid input(s): "FREET3" Anemia  Panel: No results for input(s): "VITAMINB12", "FOLATE", "FERRITIN", "TIBC", "IRON", "RETICCTPCT" in the last 72 hours.   PHYSICAL EXAM General: Well developed, well nourished, in no acute distress HEENT:  Normocephalic and atramatic Neck:  No JVD.  Lungs: Clear bilaterally to auscultation and percussion. Heart: HRRR . Normal S1 and S2 without gallops or murmurs.  Abdomen: Bowel sounds are positive, abdomen soft and non-tender  Msk:  Back normal, normal gait. Normal strength and tone for age. Extremities: No clubbing, cyanosis or edema.   Neuro: Alert and oriented X 3. Psych:  Good affect, responds appropriately  TELEMETRY: Sinus rhythm 70 bpm  ASSESSMENT AND PLAN: Acute on chronic heart failure due to HFrEF with left ventricular ejection fraction 25% with severe mitral regurgitation and moderate aortic regurgitation.  Will add losartan 25 mg today along with metoprolol succinate 25 mg daily.  Patient had an episode of wide-complex tachycardia last night about 130 bpm responded well to IV metoprolol 5 mg.  It appears patient goes into paroxysmal atrial fibrillation.  Wide-complex tachycardia is due to right bundle branch block.  Patient appeared to  be asymptomatic throughout this.  May need right and left heart catheterization and possible evaluation of mitral valve repair.   ICD-10-CM   1. Acute on chronic congestive heart failure, unspecified heart failure type (HCC)  I50.9     2. Wide-complex tachycardia  R00.0     3. Hypotension, unspecified hypotension type  I95.9       Principal Problem:   Acute hypoxic respiratory failure [facility])    Spencer Blackwater, MD, [facility] 05/15/2022 9:32 AM

## 2022-05-16 ENCOUNTER — Inpatient Hospital Stay: Payer: Medicare HMO

## 2022-05-16 DIAGNOSIS — R413 Other amnesia: Secondary | ICD-10-CM | POA: Diagnosis not present

## 2022-05-16 DIAGNOSIS — I5043 Acute on chronic combined systolic (congestive) and diastolic (congestive) heart failure: Secondary | ICD-10-CM | POA: Diagnosis not present

## 2022-05-16 DIAGNOSIS — I420 Dilated cardiomyopathy: Secondary | ICD-10-CM | POA: Diagnosis not present

## 2022-05-16 LAB — CBC
HCT: 42.8 % (ref 39.0–52.0)
Hemoglobin: 13.6 g/dL (ref 13.0–17.0)
MCH: 29.7 pg (ref 26.0–34.0)
MCHC: 31.8 g/dL (ref 30.0–36.0)
MCV: 93.4 fL (ref 80.0–100.0)
Platelets: 142 10*3/uL — ABNORMAL LOW (ref 150–400)
RBC: 4.58 MIL/uL (ref 4.22–5.81)
RDW: 15.8 % — ABNORMAL HIGH (ref 11.5–15.5)
WBC: 6.2 10*3/uL (ref 4.0–10.5)
nRBC: 0 % (ref 0.0–0.2)

## 2022-05-16 LAB — BASIC METABOLIC PANEL
Anion gap: 11 (ref 5–15)
BUN: 40 mg/dL — ABNORMAL HIGH (ref 8–23)
CO2: 24 mmol/L (ref 22–32)
Calcium: 9.1 mg/dL (ref 8.9–10.3)
Chloride: 103 mmol/L (ref 98–111)
Creatinine, Ser: 1.55 mg/dL — ABNORMAL HIGH (ref 0.61–1.24)
GFR, Estimated: 43 mL/min — ABNORMAL LOW (ref >60)
Glucose, Bld: 114 mg/dL — ABNORMAL HIGH (ref 70–99)
Potassium: 3.9 mmol/L (ref 3.5–5.1)
Sodium: 138 mmol/L (ref 135–145)

## 2022-05-16 LAB — CULTURE, BLOOD (ROUTINE X 2): Culture: NO GROWTH

## 2022-05-16 MED ORDER — AMIODARONE IV BOLUS ONLY 150 MG/100ML
150.0000 mg | INTRAVENOUS | Status: DC | PRN
  Filled 2022-05-16: qty 100

## 2022-05-16 MED ORDER — QUETIAPINE FUMARATE 25 MG PO TABS
25.0000 mg | ORAL_TABLET | Freq: Every day | ORAL | Status: DC
  Administered 2022-05-16: 25 mg via ORAL
  Filled 2022-05-16: qty 1

## 2022-05-16 NOTE — Plan of Care (Signed)
Patient AOX3, disoriented to time, impulsive and confused at baseline.  Continuously having to redirect pt.  Bed alarm activated.  All meds given on time as ordered.  Diminished lungs, IS encouraged.  Purewick intact.  Pt had multiple V-tach rhythms sustained, BP became soft and pt c/o of dizziness.  Provider notified and came to access pt.  Pt recovered to baseline without interventions.  POC maintained, will continue to monitor.  Problem: Education: Goal: Knowledge of General Education information will improve Description: Including pain rating scale, medication(s)/side effects and non-pharmacologic comfort measures Outcome: Progressing   Problem: Health Behavior/Discharge Planning: Goal: Ability to manage health-related needs will improve Outcome: Progressing   Problem: Clinical Measurements: Goal: Ability to maintain clinical measurements within normal limits will improve Outcome: Progressing Goal: Will remain free from infection Outcome: Progressing Goal: Diagnostic test results will improve Outcome: Progressing Goal: Respiratory complications will improve Outcome: Progressing Goal: Cardiovascular complication will be avoided Outcome: Progressing   Problem: Activity: Goal: Risk for activity intolerance will decrease Outcome: Progressing   Problem: Nutrition: Goal: Adequate nutrition will be maintained Outcome: Progressing   Problem: Coping: Goal: Level of anxiety will decrease Outcome: Progressing   Problem: Elimination: Goal: Will not experience complications related to bowel motility Outcome: Progressing Goal: Will not experience complications related to urinary retention Outcome: Progressing   Problem: Pain Managment: Goal: General experience of comfort will improve Outcome: Progressing   Problem: Safety: Goal: Ability to remain free from injury will improve Outcome: Progressing   Problem: Skin Integrity: Goal: Risk for impaired skin integrity will  decrease Outcome: Progressing

## 2022-05-16 NOTE — Progress Notes (Signed)
PT Cancellation Note  Patient Details Name: Spencer Coleman MRN: [id number] DOB: 10/15/1930   Cancelled Treatment:    Reason Eval/Treat Not Completed: Medical issues which prohibited therapy Code Stroke called this AM and off unit at CT. HR currently 120s at rest. Will follow up at later date and once new orders are placed.   Maylon Peppers, PT, DPT Physical Therapist - [place]  [facility]  Ambera Fedele A Khyron Garno 05/16/2022, 11:09 AM

## 2022-05-16 NOTE — Consult Note (Signed)
PHARMACIST CODE STROKE RESPONSE  Pt is not a tPA candidate. Pt is on apixaban, last dose 5/6 2952  Ronnald Ramp, PharmD, BCPS 05/16/22 10:00 AM

## 2022-05-16 NOTE — Progress Notes (Signed)
[facility] Cardiology    SUBJECTIVE: Patient lethargic somnolent but arousable denies any pain no worsening shortness of breath.   Vitals:   05/15/22 2339 05/16/22 0241 05/16/22 0346 05/16/22 0750  BP: 98/69 90/70 105/79 111/75  Pulse: 70 (!) 59 (!) 110   Resp: 14 16 15  (!) 22  Temp: 98.1 F (36.7 C)  97.7 F (36.5 C) 98 F (36.7 C)  TempSrc: Axillary  Axillary Axillary  SpO2: 98% 95% 99% 98%  Weight:   72.6 kg   Height:         Intake/Output Summary (Last 24 hours) at 05/16/2022 0802 Last data filed at 05/15/2022 2000 Gross per 24 hour  Intake 1106.97 ml  Output 950 ml  Net 156.97 ml      PHYSICAL EXAM  General: Well developed, well nourished, in no acute distress HEENT:  Normocephalic and atramatic Neck:  No JVD.  Lungs: Clear bilaterally to auscultation and percussion. Heart: HRRR . Normal S1 and S2 without gallops or murmurs.  Abdomen: Bowel sounds are positive, abdomen soft and non-tender  Msk:  Back normal, normal gait. Normal strength and tone for age. Extremities: No clubbing, cyanosis or edema.   Neuro: Alert and oriented X 3. Psych:  Good affect, responds appropriately   LABS: Basic Metabolic Panel: Recent Labs    05/14/22 0411 05/15/22 0455 05/16/22 0419  NA 138 141 138  K 4.0 3.9 3.9  CL 103 104 103  CO2 25 29 24   GLUCOSE 99 110* 114*  BUN 25* 28* 40*  CREATININE 1.46* 1.57* 1.55*  CALCIUM 9.0 8.7* 9.1  MG 2.1  --   --    Liver Function Tests: No results for input(s): "AST", "ALT", "ALKPHOS", "BILITOT", "PROT", "ALBUMIN" in the last 72 hours. No results for input(s): "LIPASE", "AMYLASE" in the last 72 hours. CBC: Recent Labs    05/15/22 0455 05/16/22 0419  WBC 5.8 6.2  HGB 13.8 13.6  HCT 43.0 42.8  MCV 93.3 93.4  PLT 116* 142*   Cardiac Enzymes: No results for input(s): "CKTOTAL", "CKMB", "CKMBINDEX", "TROPONINI" in the last 72 hours. BNP: Invalid input(s): "POCBNP" D-Dimer: No results for input(s): "DDIMER" in the last 72  hours. Hemoglobin A1C: No results for input(s): "HGBA1C" in the last 72 hours. Fasting Lipid Panel: No results for input(s): "CHOL", "HDL", "LDLCALC", "TRIG", "CHOLHDL", "LDLDIRECT" in the last 72 hours. Thyroid Function Tests: No results for input(s): "TSH", "T4TOTAL", "T3FREE", "THYROIDAB" in the last 72 hours.  Invalid input(s): "FREET3" Anemia Panel: No results for input(s): "VITAMINB12", "FOLATE", "FERRITIN", "TIBC", "IRON", "RETICCTPCT" in the last 72 hours.  No results found.   Echo severely reduced left ventricular function EF of 20 to 25%  TELEMETRY: Wide-complex tachycardia paced rate around 100:  ASSESSMENT AND PLAN:  Principal Problem:   Acute hypoxic respiratory failure (HCC) Hypertension Hyperlipidemia Chronic renal insufficiency stage III a Thrombocytopenia: Dilated cardiomyopathy History of ventricular tachycardia AICD in place Acute congestive heart failure  Plan Continue modest diuresis Amiodarone continued for atrial fibrillation rhythm management metoprolol and Eliquis anticoagulation Statin therapy be continued for hyperlipidemia Mild hypotension adequate hydration is necessary midodrine as necessary Altered mental status possibly related to benzodiazepines will consider reducing dose or holding   Alwyn Pea, MD 05/16/2022 8:02 AM

## 2022-05-16 NOTE — Progress Notes (Signed)
PROGRESS NOTE    Spencer Coleman  AVW:[id number] DOB: [birth year] DOA: 05/12/2022 PCP: Barbette Reichmann, MD    Assessment & Plan:   Principal Problem:   Acute hypoxic respiratory failure (HCC)  Assessment and Plan: Acute hypoxic respiratory failure: likely secondary to CHF exacerbation. Viral respiratory panel is neg. Weaned off of supplemental oxygen. Resolved   Acute on chronic systolic CHF exacerbation: continue on IV lasix. Monitor I/Os. EF 20-25%, grade II diastolic dysfunction & global hypokinesis.  BNP 3,155. Cardio following and recs apprec   Dilated cardiomyopathy: w/ hx of ventricular tachycardia s/p AICD placement. Continue on amiodarone, metoprolol as per cardio. Keep MAP >65. Continue on tele. Cardio following and recs apprec   PAF: continue on metoprolol, amiodarone, eliquis. Cardio following and rec apprec    HTN: continue on metoprolol. Hold for MAP <65   HLD: continue on statin   CKDIIIa: Cr is labile. Will continue to monitor   Thrombocytopenia: etiology unclear. Labile.   Poor short memory: no formal dx of dementia. Code CVA called on pt and CT head shows no acute intracranial abnormalities. Likely secondary to IV ativan which has now been d/c. Will start low dose seroquel at night.    DVT prophylaxis: eliquis  Code Status: full  Family Communication: called pt's daughter, Grayland Jack & answered her questions. [name] (pt's daughter), [place], [phone] & [phone]. Tresa Endo (other daughter) [phone]. Outpatient CM Sharlene Motts [phone].  Disposition Plan:  PT/OT recs home health. Pt is unsafe to stay at home alone.   Level of care: Progressive  Status is: Inpatient Remains inpatient appropriate because: severity of illness    Consultants:  Cardio   Procedures:   Antimicrobials:   Subjective: Pt appears lethargic.   Objective: Vitals:   05/15/22 2339 05/16/22 0241 05/16/22 0346 05/16/22 0750  BP: 98/69 90/70 105/79 111/75  Pulse: 70 (!) 59  (!) 110   Resp: 14 16 15  (!) 22  Temp: 98.1 F (36.7 C)  97.7 F (36.5 C) 98 F (36.7 C)  TempSrc: Axillary  Axillary Axillary  SpO2: 98% 95% 99% 98%  Weight:   72.6 kg   Height:        Intake/Output Summary (Last 24 hours) at 05/16/2022 4034 Last data filed at 05/15/2022 2000 Gross per 24 hour  Intake 1106.97 ml  Output 950 ml  Net 156.97 ml   Filed Weights   05/13/22 0500 05/15/22 0500 05/16/22 0346  Weight: 69.7 kg 72.7 kg 72.6 kg    Examination:  General exam: Appears lethargic  Respiratory system: decreased breath sounds b/l  Cardiovascular system: irregularly irregular  Gastrointestinal system: abd is soft, NT, ND & hypoactive bowel sounds  Central nervous system: lethargic. Moves all extremities  Psychiatry: judgement and insight appears poor. Flat mood and affect     Data Reviewed: I have personally reviewed following labs and imaging studies  CBC: Recent Labs  Lab 05/12/22 1338 05/13/22 0407 05/14/22 0411 05/15/22 0455 05/16/22 0419  WBC 5.9 6.5 5.9 5.8 6.2  NEUTROABS 3.6  --   --   --   --   HGB 13.2 12.4* 13.5 13.8 13.6  HCT 42.7 39.3 42.4 43.0 42.8  MCV 96.0 93.3 93.2 93.3 93.4  PLT 165 141* 130* 116* 142*   Basic Metabolic Panel: Recent Labs  Lab 05/12/22 1715 05/13/22 0407 05/14/22 0411 05/15/22 0455 05/16/22 0419  NA 140 140 138 141 138  K 3.8 3.5 4.0 3.9 3.9  CL 103 105 103 104 103  CO2 25 26  25 29 24   GLUCOSE 135* 119* 99 110* 114*  BUN 26* 26* 25* 28* 40*  CREATININE 1.73* 1.69* 1.46* 1.57* 1.55*  CALCIUM 8.9 8.6* 9.0 8.7* 9.1  MG 2.2 2.1 2.1  --   --   PHOS 4.2 3.7  --   --   --    GFR: Estimated Creatinine Clearance: 33.2 mL/min (A) (by C-G formula based on SCr of 1.55 mg/dL (H)). Liver Function Tests: Recent Labs  Lab 05/12/22 1715  AST 28  ALT 16  ALKPHOS 89  BILITOT 2.2*  PROT 5.7*  ALBUMIN 3.4*   No results for input(s): "LIPASE", "AMYLASE" in the last 168 hours. No results for input(s): "AMMONIA" in the last 168  hours. Coagulation Profile: No results for input(s): "INR", "PROTIME" in the last 168 hours. Cardiac Enzymes: No results for input(s): "CKTOTAL", "CKMB", "CKMBINDEX", "TROPONINI" in the last 168 hours. BNP (last 3 results) No results for input(s): "PROBNP" in the last 8760 hours. HbA1C: No results for input(s): "HGBA1C" in the last 72 hours. CBG: Recent Labs  Lab 05/12/22 1714 05/14/22 2353  GLUCAP 135* 111*   Lipid Profile: No results for input(s): "CHOL", "HDL", "LDLCALC", "TRIG", "CHOLHDL", "LDLDIRECT" in the last 72 hours. Thyroid Function Tests: No results for input(s): "TSH", "T4TOTAL", "FREET4", "T3FREE", "THYROIDAB" in the last 72 hours. Anemia Panel: No results for input(s): "VITAMINB12", "FOLATE", "FERRITIN", "TIBC", "IRON", "RETICCTPCT" in the last 72 hours. Sepsis Labs: Recent Labs  Lab 05/12/22 1715 05/13/22 0407  PROCALCITON <0.10  --   LATICACIDVEN 2.9* 1.7    Recent Results (from the past 240 hour(s))  SARS Coronavirus 2 by RT PCR (hospital order, performed in [facility] hospital lab) *cepheid single result test* Anterior Nasal Swab     Status: None   Collection Time: 05/12/22  3:54 PM   Specimen: Anterior Nasal Swab  Result Value Ref Range Status   SARS Coronavirus 2 by RT PCR NEGATIVE NEGATIVE Final    Comment: (NOTE) SARS-CoV-2 target nucleic acids are NOT DETECTED.  The SARS-CoV-2 RNA is generally detectable in upper and lower respiratory specimens during the acute phase of infection. The lowest concentration of SARS-CoV-2 viral copies this assay can detect is 250 copies / mL. A negative result does not preclude SARS-CoV-2 infection and should not be used as the sole basis for treatment or other patient management decisions.  A negative result may occur with improper specimen collection / handling, submission of specimen other than nasopharyngeal swab, presence of viral mutation(s) within the areas targeted by this assay, and inadequate number of  viral copies (<250 copies / mL). A negative result must be combined with clinical observations, patient history, and epidemiological information.  Fact Sheet for Patients:   RoadLapTop.co.za  Fact Sheet for Healthcare Providers: http://kim-miller.com/  This test is not yet approved or  cleared by the [place] FDA and has been authorized for detection and/or diagnosis of SARS-CoV-2 by FDA under an Emergency Use Authorization (EUA).  This EUA will remain in effect (meaning this test can be used) for the duration of the COVID-19 declaration under Section 564(b)(1) of the Act, 21 U.S.C. section 360bbb-3(b)(1), unless the authorization is terminated or revoked sooner.  Performed at [facility], [address] [place], [place] [address]   MRSA Next Gen by PCR, Nasal     Status: None   Collection Time: 05/12/22  5:12 PM   Specimen: Nasal Mucosa; Nasal Swab  Result Value Ref Range Status   MRSA by PCR Next Gen  NOT DETECTED NOT DETECTED Final    Comment: (NOTE) The GeneXpert MRSA Assay (FDA approved for NASAL specimens only), is one component of a comprehensive MRSA colonization surveillance program. It is not intended to diagnose MRSA infection nor to guide or monitor treatment for MRSA infections. Test performance is not FDA approved in patients less than 21 years old. Performed at [facility], [address] [place], [place] [address]   Culture, blood (Routine X 2) w Reflex to ID Panel     Status: None (Preliminary result)   Collection Time: 05/12/22  5:33 PM   Specimen: BLOOD  Result Value Ref Range Status   Specimen Description BLOOD BLOOD LEFT ARM  Final   Special Requests   Final    BOTTLES DRAWN AEROBIC AND ANAEROBIC Blood Culture adequate volume   Culture   Final    NO GROWTH 4 DAYS Performed at [facility], [address], [place], [place] [address]    Report Status PENDING  Incomplete   Culture, blood (Routine X 2) w Reflex to ID Panel     Status: None (Preliminary result)   Collection Time: 05/12/22  5:33 PM   Specimen: BLOOD  Result Value Ref Range Status   Specimen Description BLOOD BLOOD RIGHT HAND [facility]  Final   Special Requests   Final    BOTTLES DRAWN AEROBIC AND ANAEROBIC Blood Culture results may not be optimal due to an inadequate volume of blood received in culture bottles   Culture   Final    NO GROWTH 4 DAYS Performed at [facility], [address], [place], [place] [address]    Report Status PENDING  Incomplete  Urine Culture     Status: Abnormal   Collection Time: 05/12/22  9:38 PM   Specimen: Urine, Clean Catch  Result Value Ref Range Status   Specimen Description   Final    URINE, CLEAN CATCH Performed at [facility], [address] [place], [place] [address]    Special Requests   Final    NONE Performed at [facility], [address] [place], [place] [address]    Culture (A)  Final    <10,000 COLONIES/mL INSIGNIFICANT GROWTH Performed at [facility], [address], [place], [place] [address]    Report Status 05/14/2022 FINAL  Final  Respiratory (~20 pathogens) panel by PCR     Status: None   Collection Time: 05/12/22  9:38 PM   Specimen: Nasopharyngeal Swab; Respiratory  Result Value Ref Range Status   Adenovirus NOT DETECTED NOT DETECTED Final   Coronavirus 229E NOT DETECTED NOT DETECTED Final    Comment: (NOTE) The Coronavirus on the Respiratory Panel, DOES NOT test for the novel  Coronavirus (2019 nCoV)    Coronavirus HKU1 NOT DETECTED NOT DETECTED Final   Coronavirus NL63 NOT DETECTED NOT DETECTED Final   Coronavirus OC43 NOT DETECTED NOT DETECTED Final   Metapneumovirus NOT DETECTED NOT DETECTED Final   Rhinovirus / Enterovirus NOT DETECTED NOT DETECTED Final   Influenza A NOT DETECTED NOT DETECTED Final   Influenza B NOT DETECTED NOT DETECTED Final   Parainfluenza Virus 1 NOT DETECTED  NOT DETECTED Final   Parainfluenza Virus 2 NOT DETECTED NOT DETECTED Final   Parainfluenza Virus 3 NOT DETECTED NOT DETECTED Final   Parainfluenza Virus 4 NOT DETECTED NOT DETECTED Final   Respiratory Syncytial Virus NOT DETECTED NOT DETECTED Final   Bordetella pertussis NOT DETECTED NOT DETECTED Final   Bordetella Parapertussis  NOT DETECTED NOT DETECTED Final   Chlamydophila pneumoniae NOT DETECTED NOT DETECTED Final   Mycoplasma pneumoniae NOT DETECTED NOT DETECTED Final    Comment: Performed at [facility], [address], [place], [place] [address]         Radiology Studies: No results found.      Scheduled Meds:  amiodarone  200 mg Oral BID   apixaban  2.5 mg Oral BID   furosemide  20 mg Intravenous Daily   losartan  25 mg Oral Daily   metoprolol succinate  25 mg Oral Daily   simvastatin  40 mg Oral q1800   Continuous Infusions:  sodium chloride       LOS: 4 days    Time spent: 30 mins     Charise Killian, MD [facility] Pager [phone]  If 7PM-7AM, please contact night-coverage [email or web address] 05/16/2022, 8:12 AM

## 2022-05-16 NOTE — Significant Event (Signed)
Rapid Response Event Note   Reason for Call :  CODE STROKE  Initial Focused Assessment:  Dr. Mayford Knife, Dr. Selina Cooley and bedside RN at bedside.  Bedside RN stated that patient is not his normal baseline with mentation.  Pt received Ativan early in the morning around 5am.  Pt vitals showing patient in vtach-  Dr. Mayford Knife ordered for Amiodarone.      Interventions:  Dr. Selina Cooley at bedside assessed patient and stated to Dr. Mayford Knife that she could cancel the code stroke and did not see any concerning deficits in her assessment.    Plan of Care:  Dr. Mayford Knife ordered for a CT of head.     Event Summary:   MD Notified: Dr. Selina Cooley, Dr. Mayford Knife Call Time: Arrival Time: 09:58 End Time:10:10  Vincente Poli, RN

## 2022-05-16 NOTE — Plan of Care (Signed)

## 2022-05-17 DIAGNOSIS — J9601 Acute respiratory failure with hypoxia: Secondary | ICD-10-CM | POA: Diagnosis not present

## 2022-05-17 DIAGNOSIS — I420 Dilated cardiomyopathy: Secondary | ICD-10-CM | POA: Diagnosis not present

## 2022-05-17 DIAGNOSIS — I5043 Acute on chronic combined systolic (congestive) and diastolic (congestive) heart failure: Secondary | ICD-10-CM | POA: Diagnosis not present

## 2022-05-17 DIAGNOSIS — I509 Heart failure, unspecified: Secondary | ICD-10-CM | POA: Diagnosis not present

## 2022-05-17 DIAGNOSIS — I959 Hypotension, unspecified: Secondary | ICD-10-CM | POA: Diagnosis not present

## 2022-05-17 DIAGNOSIS — R000 Tachycardia, unspecified: Secondary | ICD-10-CM | POA: Diagnosis not present

## 2022-05-17 DIAGNOSIS — R413 Other amnesia: Secondary | ICD-10-CM | POA: Diagnosis not present

## 2022-05-17 LAB — CULTURE, BLOOD (ROUTINE X 2)

## 2022-05-17 LAB — BASIC METABOLIC PANEL
Anion gap: 8 (ref 5–15)
BUN: 37 mg/dL — ABNORMAL HIGH (ref 8–23)
CO2: 26 mmol/L (ref 22–32)
Calcium: 8.8 mg/dL — ABNORMAL LOW (ref 8.9–10.3)
Chloride: 105 mmol/L (ref 98–111)
Creatinine, Ser: 1.43 mg/dL — ABNORMAL HIGH (ref 0.61–1.24)
GFR, Estimated: 47 mL/min — ABNORMAL LOW (ref >60)
Glucose, Bld: 89 mg/dL (ref 70–99)
Potassium: 3.9 mmol/L (ref 3.5–5.1)
Sodium: 139 mmol/L (ref 135–145)

## 2022-05-17 LAB — CBC
HCT: 42 % (ref 39.0–52.0)
Hemoglobin: 12.9 g/dL — ABNORMAL LOW (ref 13.0–17.0)
MCH: 30 pg (ref 26.0–34.0)
MCHC: 30.7 g/dL (ref 30.0–36.0)
MCV: 97.7 fL (ref 80.0–100.0)
Platelets: 102 10*3/uL — ABNORMAL LOW (ref 150–400)
RBC: 4.3 MIL/uL (ref 4.22–5.81)
RDW: 15.9 % — ABNORMAL HIGH (ref 11.5–15.5)
WBC: 6.2 10*3/uL (ref 4.0–10.5)
nRBC: 0 % (ref 0.0–0.2)

## 2022-05-17 MED ORDER — QUETIAPINE FUMARATE 25 MG PO TABS
12.5000 mg | ORAL_TABLET | Freq: Every day | ORAL | Status: DC
  Administered 2022-05-17 – 2022-05-30 (×14): 12.5 mg via ORAL
  Filled 2022-05-17 (×14): qty 1

## 2022-05-17 MED ORDER — MAGNESIUM SULFATE 2 GM/50ML IV SOLN
2.0000 g | Freq: Once | INTRAVENOUS | Status: AC
  Administered 2022-05-17: 2 g via INTRAVENOUS
  Filled 2022-05-17: qty 50

## 2022-05-17 MED ORDER — FUROSEMIDE 10 MG/ML IJ SOLN
40.0000 mg | Freq: Two times a day (BID) | INTRAMUSCULAR | Status: DC
  Administered 2022-05-17 – 2022-05-19 (×5): 40 mg via INTRAVENOUS
  Filled 2022-05-17 (×5): qty 4

## 2022-05-17 MED ORDER — AMIODARONE HCL 200 MG PO TABS
200.0000 mg | ORAL_TABLET | Freq: Every day | ORAL | Status: DC
  Administered 2022-05-28 – 2022-05-31 (×4): 200 mg via ORAL
  Filled 2022-05-17 (×4): qty 1

## 2022-05-17 MED ORDER — CEFUROXIME AXETIL 500 MG PO TABS
250.0000 mg | ORAL_TABLET | Freq: Two times a day (BID) | ORAL | Status: AC
  Administered 2022-05-18 – 2022-05-19 (×4): 250 mg via ORAL
  Filled 2022-05-17 (×2): qty 0.5
  Filled 2022-05-17: qty 1
  Filled 2022-05-17: qty 0.5
  Filled 2022-05-17: qty 1

## 2022-05-17 MED ORDER — CEFDINIR 300 MG PO CAPS
300.0000 mg | ORAL_CAPSULE | Freq: Every day | ORAL | Status: DC
  Administered 2022-05-17: 300 mg via ORAL
  Filled 2022-05-17: qty 1

## 2022-05-17 MED ORDER — AMIODARONE HCL 200 MG PO TABS
400.0000 mg | ORAL_TABLET | Freq: Two times a day (BID) | ORAL | Status: AC
  Administered 2022-05-17 – 2022-05-27 (×21): 400 mg via ORAL
  Filled 2022-05-17 (×21): qty 2

## 2022-05-17 MED ORDER — POTASSIUM CHLORIDE 20 MEQ PO PACK
40.0000 meq | PACK | Freq: Once | ORAL | Status: AC
  Administered 2022-05-17: 40 meq via ORAL
  Filled 2022-05-17: qty 2

## 2022-05-17 NOTE — Care Management Important Message (Signed)
Important Message  Patient Details  Name: Spencer Coleman MRN: [id number] Date of Birth: 09-Oct-1930   Medicare Important Message Given:  Yes     Johnell Comings 05/17/2022, 11:55 AM

## 2022-05-17 NOTE — TOC Progression Note (Signed)
Transition of Care [facility]) - Progression Note    Patient Details  Name: Spencer Coleman MRN: [id number] Date of Birth: [birth year]  Transition of Care [facility]) CM/SW Contact  Truddie Hidden, RN Phone Number: 05/17/2022, 11:54 AM  Clinical Narrative:    Spoke with patient's sister,Cheryl at the bedside. Patient stated patient lives alone and does not feel he is safe to return home alone. Patient daughter [name] is his POA. They have explore options for placement at ALF. [name] was advised patient would have to have  long term payor or private pay. She also stated [facility] DSS has been involved in patient's care.  Attempt to reach patient's daughter, [name] no answer.          Expected Discharge Plan and Services                                               Social Determinants of Health (SDOH) Interventions SDOH Screenings   Food Insecurity: No Food Insecurity (05/16/2022)  Housing: Low Risk  (05/16/2022)  Transportation Needs: No Transportation Needs (05/16/2022)  Utilities: Not At Risk (05/16/2022)  Tobacco Use: Low Risk  (05/12/2022)    Readmission Risk Interventions     No data to display

## 2022-05-17 NOTE — Progress Notes (Signed)
Palliative Care Progress Note, Assessment & Plan   Patient Name: Spencer Coleman       Date: 05/17/2022 DOB: March 03, 1930  Age: 89 y.o. MRN#: [id number] Attending Physician: Charise Killian, MD Primary Care Physician: Barbette Reichmann, MD Admit Date: 05/12/2022  Subjective: Patient is sitting up in bed.  He is able to acknowledge my presence and make his wishes known.  He is enjoying his vegetable soup.  His sister is at bedside.  Patient has no acute complaints at this time.  HPI: 89 y.o. male  with past medical history of PAF, Ventricular Tachycardia s/p BiV ICD (2009), HLD, HTN, dilated cardiomyopathy, chronic systolic heart failure and CKD admitted from home on 05/12/2022 with complaints of shortness of breath. EMS was called by sister as she was concerned about ongoing weakness and possible UTI being managed as outpatient.   On arrival to ED, found to be in wide-complex tachycardia (140's) with soft SBP (80-90's). BNP >3,000 and CXR concerning for vascular congestion. Started on amiodarone infusion, initially admitted to ICU due to concern for needing vasopressor support given hypotension-vasopressors were not initiated    Transitioned to PO amiodarone 5/3, tolerating well   Episodes of confusion, AMS, agitation throughout hospitalization, mentation seems to wax and wane   Palliative medicine was consulted for assisting with goals of care conversations.  Summary of counseling/coordination of care: After reviewing the patient's chart and assessing the patient at bedside, I attempted to speak with patient in regards to plan and goals of care.  Patient is unable to participate independently in medical decision making at this time due to memory issues.  Patient says all he needs to do is to be able to walk and  that we will get him better. He lacks insight into his current medical condition or reason he is in the hospital.   After meeting with the patient, I spoke with his sister CHeryl outside of the room.  She shares that patient's daughter [name] is POA and Management consultant.  CHeryl plans to bring HCPOA paperwork to hospital tomorrow.   [name] shares concerns that patient will not be able to return home and has no money to provide for LTC.  I reiterated that palliative's role is to establish goals and boundaries of care as well as manage patient's symptoms.  I assured her that [facility] is following closely for discharge planning.  After meeting with patient's sister, I attempted to speak with patient's daughter/HCPOA over the phone.  No answer.  HIPAA compliant voicemail left.  PMT will continue to follow and support patient and family throughout his hospitalization.  Further clarification of goals needed with next of kin decision maker [name].  Physical Exam Vitals reviewed.  Constitutional:      General: He is not in acute distress.    Appearance: He is normal weight. He is not ill-appearing.  HENT:     Head: Normocephalic.  Cardiovascular:     Rate and Rhythm: Normal rate.  Pulmonary:     Effort: Pulmonary effort is normal.  Neurological:     Mental Status: He is alert.     Comments: Oriented to self and place  Psychiatric:  Mood and Affect: Mood is not anxious.        Behavior: Behavior normal. Behavior is not agitated.             Total Time 25 minutes   Roshell Brigham L. Manon Hilding, FNP-BC Palliative Medicine Team Team Phone # [phone]

## 2022-05-17 NOTE — TOC Progression Note (Signed)
Transition of Care [facility]) - Progression Note    Patient Details  Name: JESUSANGEL DICOCHEA MRN: [id number] Date of Birth: May 28, 1930  [facility]) CM/SW Contact  Truddie Hidden, RN Phone Number: 05/17/2022, 4:27 PM  Clinical Narrative:    Spoke with patient's daughter regarding discharge plan. She was advised of HH PT rec per therapy. She odesn ot havea plan at this time for patient to return home. She has been advised patient would require 24 care. She stated he does not have to abliity to private pay for ALF. She is agreeable to assistance from [facility] for ALF screening and placement.         Expected Discharge Plan and Services                                               Social Determinants of Health (SDOH) Interventions SDOH Screenings   Food Insecurity: No Food Insecurity (05/16/2022)  Housing: Low Risk  (05/16/2022)  Transportation Needs: No Transportation Needs (05/16/2022)  Utilities: Not At Risk (05/16/2022)  Tobacco Use: Low Risk  (05/12/2022)    Readmission Risk Interventions     No data to display

## 2022-05-17 NOTE — Progress Notes (Signed)
PROGRESS NOTE   HPI was taken from NP [facility]: This is an 89 yo male with a PMH of Paroxysmal Atrial Fibrillation, Ventricular Tachycardia s/p BiV ICD (08/09/2007), HLD, HTN, Dilated Cardiomyopathy, Chronic Systolic CHF, and Chronic Kidney Disease.  He presented to [facility] via EMS on 05/2 with c/o shortness of breath.  Pt reports symptoms started 02:00 am this morning.  He did not call EMS at that time because he did not want to wake them up.  However, pts sister stated the pt appeared weak yesterday therefore she called to check on the pt today and decided to call EMS.  Pts sister reported the pt has possible UTI and upon reviewing pts chart a urine culture was sent.  Pt was not started on abx awaiting culture results.     ED Course Upon arrival to the ER pt noted to be in a wide-complex tachycardia heart rate in the 140's with soft bp readings sbp 80-90's.  Pt has an ICD and per [employer] pts device only picks ups if the heart rate is greater than 150's.  ER provider contacted on call Cardiologist Dr. Juliann Pares who recommended 12 mg of adenosine, however pt remained tachycardic.  Therefore, per Dr. Glennis Brink recommendations pt received amiodarone bolus followed by amiodarone gtt.  Pt also noted to have an elevated bnp of 3,155.4 and CXR concerning for vascular congestion and tiny right effusion.  Per cardiology recommendations pt received 80 mg iv lasix x 1 dose per cardiology recommendations.  PCCM team contacted for ICU admission given possible need of vasopressors following administration of iv lasix.  As per Dr. Mayford Knife 5/3-05/17/22: Pt was found to have CHF exacerbation on admission. Pt was treated w/ IV lasix, metoprolol. Of note, pt has hx of VT and is already on metoprolol & amio as per cardio. Cardio has been following and managing pt's CHF, PAF, dilated cardiomyopathy & ventricular tachycardia. Of note, pt has had poor short term memory as pt does not have a formal dx of dementia. PT/OT evaluated  pt and recommends home health. Pt lives alone and pt is unsafe d/c home alone, this has been discussed w/ pt's POA, [name] (pt's daughter) & CM. Pt has no long term care insurance as per CM.    ROLF STANGLAND  GNF:[id number] DOB: [birth year] DOA: 05/12/2022 PCP: Barbette Reichmann, MD    Assessment & Plan:   Principal Problem:   Acute hypoxic respiratory failure (HCC)  Assessment and Plan: Acute hypoxic respiratory failure: likely secondary to CHF exacerbation. Viral respiratory panel is neg. Weaned off of supplemental oxygen. Resolved   Acute on chronic systolic CHF exacerbation: continue on IV lasix. Monitor I/Os. 20-25%, grade II diastolic dysfunction & global hypokinesis.  BNP 3,155. Cardio following and recs apprec   Dilated cardiomyopathy: w/ hx of ventricular tachycardia s/p AICD placement. Continue on metoprolol, amio as per cardio. Keep MAP >65. Continue on tele. Cardio following and recs apprec   PAF: continue on amio, metoprolol, & eliquis. Cardio following and recs apprec    UTI: urine cx outpatient growing e.coli. Pt's sent ceftin to pt's outpatient pharmacy but pt did not start taking ceftin yet. Ceftin is not on formulary so pt was started on cefdinir today x 7 days   HTN: continue on metoprolol. Hold for MAP < 65   HLD: continue on statin   CKDIIIa: Cr is trending down from day prior   Thrombocytopenia: etiology unclear. Labile   Poor short memory: no formal dx of dementia. Code CVA called  on pt 05/16/22 as per pt's nurse and CT head shows no acute intracranial abnormalities. Likely secondary to IV ativan which has now been d/c. Started on seroquel but will reduce seroquel dose tonight   DVT prophylaxis: eliquis  Code Status: full  Family Communication: [name] (pt's daughter), POA, [phone] & [phone]. Tresa Endo (other daughter) [phone]. Outpatient CM Sharlene Motts [phone].  Disposition Plan:  PT/OT recs home health. Pt is unsafe to stay at home alone.   Level  of care: Progressive  Status is: Inpatient Remains inpatient appropriate because: unsafe to stay at home alone    Consultants:  Cardio   Procedures:   Antimicrobials:   Subjective: Pt c/o fatigue    Objective: Vitals:   05/17/22 0400 05/17/22 0500 05/17/22 0600 05/17/22 0750  BP:    (!) 82/60  Pulse: 69 73 69 60  Resp: 17 16 15 20   Temp:    (!) 97.2 F (36.2 C)  TempSrc:    Axillary  SpO2: 100% 100% 100% 100%  Weight:      Height:        Intake/Output Summary (Last 24 hours) at 05/17/2022 0818 Last data filed at 05/16/2022 2000 Gross per 24 hour  Intake 120 ml  Output 400 ml  Net -280 ml   Filed Weights   05/15/22 0500 05/16/22 0346 05/17/22 0348  Weight: 72.7 kg 72.6 kg 56.9 kg    Examination:  General exam: Appears comfortable  Respiratory system: decreased breath sounds b/l Cardiovascular system: irregular. No rubs or gallops  Gastrointestinal system: abd is soft, NT, ND & hypoactive bowel sounds Central nervous system: lethargic. Moves all extremities  Psychiatry: judgement and insight appears poor. Flat mood and affect     Data Reviewed: I have personally reviewed following labs and imaging studies  CBC: Recent Labs  Lab 05/12/22 1338 05/13/22 0407 05/14/22 0411 05/15/22 0455 05/16/22 0419 05/17/22 0522  WBC 5.9 6.5 5.9 5.8 6.2 6.2  NEUTROABS 3.6  --   --   --   --   --   HGB 13.2 12.4* 13.5 13.8 13.6 12.9*  HCT 42.7 39.3 42.4 43.0 42.8 42.0  MCV 96.0 93.3 93.2 93.3 93.4 97.7  PLT 165 141* 130* 116* 142* 102*   Basic Metabolic Panel: Recent Labs  Lab 05/12/22 1715 05/13/22 0407 05/14/22 0411 05/15/22 0455 05/16/22 0419 05/17/22 0522  NA 140 140 138 141 138 139  K 3.8 3.5 4.0 3.9 3.9 3.9  CL 103 105 103 104 103 105  CO2 25 26 25 29 24 26   GLUCOSE 135* 119* 99 110* 114* 89  BUN 26* 26* 25* 28* 40* 37*  CREATININE 1.73* 1.69* 1.46* 1.57* 1.55* 1.43*  CALCIUM 8.9 8.6* 9.0 8.7* 9.1 8.8*  MG 2.2 2.1 2.1  --   --   --   PHOS 4.2 3.7   --   --   --   --    GFR: Estimated Creatinine Clearance: 28.2 mL/min (A) (by C-G formula based on SCr of 1.43 mg/dL (H)). Liver Function Tests: Recent Labs  Lab 05/12/22 1715  AST 28  ALT 16  ALKPHOS 89  BILITOT 2.2*  PROT 5.7*  ALBUMIN 3.4*   No results for input(s): "LIPASE", "AMYLASE" in the last 168 hours. No results for input(s): "AMMONIA" in the last 168 hours. Coagulation Profile: No results for input(s): "INR", "PROTIME" in the last 168 hours. Cardiac Enzymes: No results for input(s): "CKTOTAL", "CKMB", "CKMBINDEX", "TROPONINI" in the last 168 hours. BNP (last 3 results) No  results for input(s): "PROBNP" in the last 8760 hours. HbA1C: No results for input(s): "HGBA1C" in the last 72 hours. CBG: Recent Labs  Lab 05/12/22 1714 05/14/22 2353  GLUCAP 135* 111*   Lipid Profile: No results for input(s): "CHOL", "HDL", "LDLCALC", "TRIG", "CHOLHDL", "LDLDIRECT" in the last 72 hours. Thyroid Function Tests: No results for input(s): "TSH", "T4TOTAL", "FREET4", "T3FREE", "THYROIDAB" in the last 72 hours. Anemia Panel: No results for input(s): "VITAMINB12", "FOLATE", "FERRITIN", "TIBC", "IRON", "RETICCTPCT" in the last 72 hours. Sepsis Labs: Recent Labs  Lab 05/12/22 1715 05/13/22 0407  PROCALCITON <0.10  --   LATICACIDVEN 2.9* 1.7    Recent Results (from the past 240 hour(s))  SARS Coronavirus 2 by RT PCR (hospital order, performed in [facility] hospital lab) *cepheid single result test* Anterior Nasal Swab     Status: None   Collection Time: 05/12/22  3:54 PM   Specimen: Anterior Nasal Swab  Result Value Ref Range Status   SARS Coronavirus 2 by RT PCR NEGATIVE NEGATIVE Final    Comment: (NOTE) SARS-CoV-2 target nucleic acids are NOT DETECTED.  The SARS-CoV-2 RNA is generally detectable in upper and lower respiratory specimens during the acute phase of infection. The lowest concentration of SARS-CoV-2 viral copies this assay can detect is 250 copies / mL. A  negative result does not preclude SARS-CoV-2 infection and should not be used as the sole basis for treatment or other patient management decisions.  A negative result may occur with improper specimen collection / handling, submission of specimen other than nasopharyngeal swab, presence of viral mutation(s) within the areas targeted by this assay, and inadequate number of viral copies (<250 copies / mL). A negative result must be combined with clinical observations, patient history, and epidemiological information.  Fact Sheet for Patients:   RoadLapTop.co.za  Fact Sheet for Healthcare Providers: http://kim-miller.com/  This test is not yet approved or  cleared by the [place] FDA and has been authorized for detection and/or diagnosis of SARS-CoV-2 by FDA under an Emergency Use Authorization (EUA).  This EUA will remain in effect (meaning this test can be used) for the duration of the COVID-19 declaration under Section 564(b)(1) of the Act, 21 U.S.C. section 360bbb-3(b)(1), unless the authorization is terminated or revoked sooner.  Performed at [facility], [address] [place], [place] [address]   MRSA Next Gen by PCR, Nasal     Status: None   Collection Time: 05/12/22  5:12 PM   Specimen: Nasal Mucosa; Nasal Swab  Result Value Ref Range Status   MRSA by PCR Next Gen NOT DETECTED NOT DETECTED Final    Comment: (NOTE) The GeneXpert MRSA Assay (FDA approved for NASAL specimens only), is one component of a comprehensive MRSA colonization surveillance program. It is not intended to diagnose MRSA infection nor to guide or monitor treatment for MRSA infections. Test performance is not FDA approved in patients less than 30 years old. Performed at [facility], [address] [place], [place] [address]   Culture, blood (Routine X 2) w Reflex to ID Panel     Status: None (Preliminary result)   Collection Time:  05/12/22  5:33 PM   Specimen: BLOOD  Result Value Ref Range Status   Specimen Description BLOOD BLOOD LEFT ARM  Final   Special Requests   Final    BOTTLES DRAWN AEROBIC AND ANAEROBIC Blood Culture adequate volume   Culture   Final    NO GROWTH 4 DAYS Performed at [facility]  [address], [place], [place] [address]    Report Status PENDING  Incomplete  Culture, blood (Routine X 2) w Reflex to ID Panel     Status: None (Preliminary result)   Collection Time: 05/12/22  5:33 PM   Specimen: BLOOD  Result Value Ref Range Status   Specimen Description BLOOD BLOOD RIGHT HAND [facility]  Final   Special Requests   Final    BOTTLES DRAWN AEROBIC AND ANAEROBIC Blood Culture results may not be optimal due to an inadequate volume of blood received in culture bottles   Culture   Final    NO GROWTH 4 DAYS Performed at [facility], [address], [place], [place] [address]    Report Status PENDING  Incomplete  Urine Culture     Status: Abnormal   Collection Time: 05/12/22  9:38 PM   Specimen: Urine, Clean Catch  Result Value Ref Range Status   Specimen Description   Final    URINE, CLEAN CATCH Performed at [facility], [address] [place], [place] [address]    Special Requests   Final    NONE Performed at [facility], [address] [place], [place] [address]    Culture (A)  Final    <10,000 COLONIES/mL INSIGNIFICANT GROWTH Performed at [facility], [address], [place], [place] [address]    Report Status 05/14/2022 FINAL  Final  Respiratory (~20 pathogens) panel by PCR     Status: None   Collection Time: 05/12/22  9:38 PM   Specimen: Nasopharyngeal Swab; Respiratory  Result Value Ref Range Status   Adenovirus NOT DETECTED NOT DETECTED Final   Coronavirus 229E NOT DETECTED NOT DETECTED Final    Comment: (NOTE) The Coronavirus on the Respiratory Panel, DOES NOT test for the novel  Coronavirus (2019 nCoV)    Coronavirus HKU1  NOT DETECTED NOT DETECTED Final   Coronavirus NL63 NOT DETECTED NOT DETECTED Final   Coronavirus OC43 NOT DETECTED NOT DETECTED Final   Metapneumovirus NOT DETECTED NOT DETECTED Final   Rhinovirus / Enterovirus NOT DETECTED NOT DETECTED Final   Influenza A NOT DETECTED NOT DETECTED Final   Influenza B NOT DETECTED NOT DETECTED Final   Parainfluenza Virus 1 NOT DETECTED NOT DETECTED Final   Parainfluenza Virus 2 NOT DETECTED NOT DETECTED Final   Parainfluenza Virus 3 NOT DETECTED NOT DETECTED Final   Parainfluenza Virus 4 NOT DETECTED NOT DETECTED Final   Respiratory Syncytial Virus NOT DETECTED NOT DETECTED Final   Bordetella pertussis NOT DETECTED NOT DETECTED Final   Bordetella Parapertussis NOT DETECTED NOT DETECTED Final   Chlamydophila pneumoniae NOT DETECTED NOT DETECTED Final   Mycoplasma pneumoniae NOT DETECTED NOT DETECTED Final    Comment: Performed at [facility], [address], [place], [place] [address]         Radiology Studies: CT HEAD CODE STROKE WO CONTRAST`  Result Date: 05/16/2022 CLINICAL DATA:  Code stroke.  89 year old male EXAM: CT HEAD WITHOUT CONTRAST TECHNIQUE: Contiguous axial images were obtained from the base of the skull through the vertex without intravenous contrast. RADIATION DOSE REDUCTION: This exam was performed according to the departmental dose-optimization program which includes automated exposure control, adjustment of the mA and/or kV according to patient size and/or use of iterative reconstruction technique. COMPARISON:  Head CT 06/12/2021. FINDINGS: Brain: Stable cerebral volume from last year. No midline shift, mass effect, or evidence of intracranial mass lesion. No acute intracranial hemorrhage identified. No ventriculomegaly. No cortically  based acute infarct identified. Gray-white differentiation does not appear significantly changed from last year, mild for age small vessel disease changes. Vascular: Extensive Calcified atherosclerosis  at the skull base. No suspicious intracranial vascular hyperdensity. Skull: No acute osseous abnormality identified. Sinuses/Orbits: Visualized paranasal sinuses and mastoids are stable and well aerated. Other: Visualized orbits and scalp soft tissues are within normal limits. ASPECTS [facility] Stroke Program Early CT Score) Total score (0-10 with 10 being normal): 10 IMPRESSION: 1. No acute cortically based infarct or acute intracranial hemorrhage identified. ASPECTS 10. 2. Mild for age small vessel disease changes. Electronically Signed   By: Odessa Fleming M.D.   On: 05/16/2022 10:24        Scheduled Meds:  amiodarone  200 mg Oral BID   apixaban  2.5 mg Oral BID   furosemide  20 mg Intravenous Daily   losartan  25 mg Oral Daily   metoprolol succinate  25 mg Oral Daily   QUEtiapine  25 mg Oral QHS   simvastatin  40 mg Oral q1800   Continuous Infusions:  sodium chloride       LOS: 5 days    Time spent: 25 mins     Charise Killian, MD [facility] Pager [phone]  If 7PM-7AM, please contact night-coverage [email or web address] 05/17/2022, 8:18 AM

## 2022-05-17 NOTE — Progress Notes (Signed)
Physical Therapy Treatment Patient Details Name: Spencer Coleman MRN: [id number] DOB: Oct 07, 1930 Today's Date: 05/17/2022   History of Present Illness Pt is an 89 y.o. male with history of hypertension, hyperlipidemia, CKD, CHF, AICD, work up for acute on chronic systolic CHF exacerbation.    PT Comments    Pt was pleasant and motivated to participate during the session and put forth good effort throughout. Spoke to cardiology PA prior to entering room who cleared pt for participation with PT.  Pt required no physical assistance during the session but did require extra time and effort with functional tasks.  Pt's HR noted to be elevated to the low 120's from the 80s-90s after prolonged sitting at the EOB with nursing in room and aware.  Pt able to transfer and take several small steps from bed to chair with no adverse symptoms and with no further significant elevation of HR but additional ambulation deferred at this time.  Pt will benefit from continued PT services upon discharge to safely address deficits listed in patient problem list for decreased caregiver assistance and eventual return to PLOF.     Recommendations for follow up therapy are one component of a multi-disciplinary discharge planning process, led by the attending physician.  Recommendations may be updated based on patient status, additional functional criteria and insurance authorization.  Follow Up Recommendations       Assistance Recommended at Discharge Frequent or constant Supervision/Assistance  Patient can return home with the following Assist for transportation;Direct supervision/assist for medications management;A little help with walking and/or transfers;A little help with bathing/dressing/bathroom   Equipment Recommendations  None recommended by PT    Recommendations for Other Services       Precautions / Restrictions Precautions Precautions: Fall Restrictions Weight Bearing Restrictions: No     Mobility  Bed  Mobility Overal bed mobility: Modified Independent             General bed mobility comments: Min extra time and effort only    Transfers Overall transfer level: Needs assistance Equipment used: Rolling walker (2 wheels) Transfers: Sit to/from Stand Sit to Stand: Min guard           General transfer comment: Min verbal cues for hand placement    Ambulation/Gait Ambulation/Gait assistance: Min guard Gait Distance (Feet): 3 Feet Assistive device: Rolling walker (2 wheels) Gait Pattern/deviations: Step-through pattern, Decreased step length - right, Decreased step length - left, Trunk flexed Gait velocity: decreased     General Gait Details: Slow cadence with mod flexed trunk position but steady without LOB, distance limited by elevated HR, nursing in room and aware   Stairs             Wheelchair Mobility    Modified Rankin (Stroke Patients Only)       Balance Overall balance assessment: Needs assistance Sitting-balance support: Feet supported Sitting balance-Leahy Scale: Good     Standing balance support: Bilateral upper extremity supported, During functional activity, Reliant on assistive device for balance Standing balance-Leahy Scale: Fair                              Cognition Arousal/Alertness: Awake/alert Behavior During Therapy: WFL for tasks assessed/performed Overall Cognitive Status: Within Functional Limits for tasks assessed  Exercises Other Exercises Other Exercises: Static sitting at EOB for core therex and increased activity tolerance    General Comments        Pertinent Vitals/Pain Pain Assessment Pain Assessment: No/denies pain    Home Living                          Prior Function            PT Goals (current goals can now be found in the care plan section) Progress towards PT goals: PT to reassess next treatment    Frequency     Min 2X/week      PT Plan Current plan remains appropriate    Co-evaluation              AM-PAC PT "6 Clicks" Mobility   Outcome Measure  Help needed turning from your back to your side while in a flat bed without using bedrails?: None Help needed moving from lying on your back to sitting on the side of a flat bed without using bedrails?: None Help needed moving to and from a bed to a chair (including a wheelchair)?: A Little Help needed standing up from a chair using your arms (e.g., wheelchair or bedside chair)?: A Little Help needed to walk in hospital room?: A Little Help needed climbing 3-5 steps with a railing? : A Little 6 Click Score: 20    End of Session Equipment Utilized During Treatment: Gait belt;Oxygen Activity Tolerance: Other (comment) (elevated HR with limited activity) Patient left: in chair;with call bell/phone within reach;with chair alarm set;with family/visitor present;with nursing/sitter in room Nurse Communication: Mobility status;Other (comment) (elevated HR with limited activity) PT Visit Diagnosis: Other abnormalities of gait and mobility (R26.89);Muscle weakness (generalized) (M62.81)     Time: 4098-1191 PT Time Calculation (min) (ACUTE ONLY): 26 min  Charges:  $Therapeutic Activity: 23-37 mins                     D. Scott Marlie Kuennen PT, DPT 05/17/22, 11:19 AM

## 2022-05-17 NOTE — Progress Notes (Signed)
OT Cancellation Note  Patient Details Name: Spencer Coleman MRN: [id number] DOB: 10-26-1930   Cancelled Treatment:    Reason Eval/Treat Not Completed: Other (comment). Upon attempt, pt with nurse for assessment, pt sleeping soundly, and family entering room. Will re-attempt OT tx next date as appropriate.   Arman Filter., MPH, MS, OTR/L ascom [phone] 05/17/22, 4:09 PM

## 2022-05-17 NOTE — Progress Notes (Signed)
[facility] CLINIC CARDIOLOGY CONSULT NOTE       Patient ID: Spencer Coleman MRN: [id number] DOB/AGE: [birth year] 87 y.o.  Admit date: 05/12/2022 Referring Physician Dr. Artis Delay Primary Physician Dr. Marcello Fennel Primary Cardiologist Dr. Darrold Junker Reason for Consultation wide complex tachycardia   HPI: Spencer Coleman is an 62yoM with a PMH of chronic HFrEF / NICM, hx VT s/p BiV ICD, paroxysmal AF, CKD 3, HTN, HLD who presented to [facility] ED 05/12/2022 with shortness of breath. Tele strips in the ED noted a wide complex tachycardia that did not terminate with IV adenosine, most c/w VT. BNP elevated to 3000 on admission and CXR with vascular congestion c/f acute on chronic HFrEF.   Interval history:  - overnight events noted, patient was very somnolent after ativan yesterday PM. Code stroke called and cancelled.  Head CT without acute infarct - feels ok today, still on 2L [place]. No chest pain, lightheadedness or dizziness. Falls asleep mid interview, but awakens to voice.  - in NSR/AV pacing on tele with ~45 mins of a WCT this AM   Review of systems complete and found to be negative unless listed above     Past Medical History:  Diagnosis Date   CAD (coronary artery disease)    Nonobstructive cardiac disease by cath   CHF (congestive heart failure) (HCC)    nonischemic cardiomyopathy   CKD (chronic kidney disease)    History of kidney stones    Hyperlipidemia    Hypertension    Inguinal hernia    PAF (paroxysmal atrial fibrillation) (HCC)    Patella fracture 01/09/2015   Renal colic 10/07/2011   Ventricular tachycardia [facility])    s/p ICD    Past Surgical History:  Procedure Laterality Date   CARDIAC CATHETERIZATION     CARDIAC DEFIBRILLATOR PLACEMENT  2009   CYSTOSCOPY WITH LITHOLAPAXY N/A 02/21/2018   Procedure: CYSTOSCOPY WITH LITHOLAPAXY;  Surgeon: Sondra Come, MD;  Location: [facility] ORS;  Service: Urology;  Laterality: N/A;   ELBOW SURGERY Left    HOLMIUM LASER APPLICATION N/A 02/21/2018    Procedure: HOLMIUM LASER APPLICATION;  Surgeon: Sondra Come, MD;  Location: [facility] ORS;  Service: Urology;  Laterality: N/A;   ICD GENERATOR CHANGEOUT N/A 01/28/2021   Procedure: ICD GENERATOR CHANGEOUT;  Surgeon: Marcina Millard, MD;  Location: [facility] INVASIVE CV LAB;  Service: Cardiovascular;  Laterality: N/A;   INGUINAL HERNIA REPAIR     LITHOTRIPSY     ORIF left wrist fracture Left    ORIF PATELLA Left 01/10/2015   Procedure: OPEN REDUCTION INTERNAL (ORIF) FIXATION PATELLA;  Surgeon: Donato Heinz, MD;  Location: [facility] ORS;  Service: Orthopedics;  Laterality: Left;   ORIF PATELLA Left 02/06/2015   Procedure: OPEN REDUCTION INTERNAL (ORIF) FIXATION PATELLA;  Surgeon: Donato Heinz, MD;  Location: [facility] ORS;  Service: Orthopedics;  Laterality: Left;   TRANSURETHRAL RESECTION OF PROSTATE N/A 02/21/2018   Procedure: TRANSURETHRAL RESECTION OF THE PROSTATE (TURP);  Surgeon: Sondra Come, MD;  Location: [facility] ORS;  Service: Urology;  Laterality: N/A;    Medications Prior to Admission  Medication Sig Dispense Refill Last Dose   acetaminophen (TYLENOL) 500 MG tablet Take 2 tablets (1,000 mg total) by mouth every 6 (six) hours. 30 tablet 0 unknown   amiodarone (PACERONE) 400 MG tablet Take 400 mg by mouth daily.   05/11/2022   apixaban (ELIQUIS) 2.5 MG TABS tablet Take 1 tablet (2.5 mg total) by mouth 2 (two) times daily. 60 tablet 1 05/11/2022  furosemide (LASIX) 20 MG tablet Take 20 mg by mouth daily.   05/11/2022   HYDROcodone-acetaminophen (NORCO/VICODIN) 5-325 MG tablet Take 1 tablet by mouth every 6 (six) hours as needed for moderate pain.   unknown   metoprolol succinate (TOPROL-XL) 25 MG 24 hr tablet Take 25 mg by mouth daily.   05/11/2022   simvastatin (ZOCOR) 40 MG tablet Take 40 mg by mouth at bedtime.   05/11/2022   Vitamin D, Ergocalciferol, (DRISDOL) 1.25 MG (50000 UNIT) CAPS capsule Take 50,000 Units by mouth once a week.   Past Week   Social History   Socioeconomic History    Marital status: Single    Spouse name: Not on file   Number of children: Not on file   Years of education: Not on file   Highest education level: Not on file  Occupational History   Occupation: retired  Tobacco Use   Smoking status: Never   Smokeless tobacco: Never  Vaping Use   Vaping Use: Never used  Substance and Sexual Activity   Alcohol use: No    Alcohol/week: 0.0 standard drinks of alcohol   Drug use: No   Sexual activity: Not on file  Other Topics Concern   Not on file  Social History Narrative   Lives at home. Independent, no assisted devices at baseline   Social Determinants of Health   Financial Resource Strain: Not on file  Food Insecurity: No Food Insecurity (05/16/2022)   Hunger Vital Sign    Worried About Running Out of Food in the Last Year: Never true    Ran Out of Food in the Last Year: Never true  Transportation Needs: No Transportation Needs (05/16/2022)   PRAPARE - Administrator, Civil Service (Medical): No    Lack of Transportation (Non-Medical): No  Physical Activity: Not on file  Stress: Not on file  Social Connections: Not on file  Intimate Partner Violence: Not At Risk (05/16/2022)   Humiliation, Afraid, Rape, and Kick questionnaire    Fear of Current or Ex-Partner: No    Emotionally Abused: No    Physically Abused: No    Sexually Abused: No    Family History  Problem Relation Age of Onset   CAD Mother    Kidney cancer Father    Prostate cancer Neg Hx    Bladder Cancer Neg Hx       Intake/Output Summary (Last 24 hours) at 05/17/2022 1324 Last data filed at 05/17/2022 1043 Gross per 24 hour  Intake 120 ml  Output 150 ml  Net -30 ml    Vitals:   05/17/22 0750 05/17/22 0907 05/17/22 0955 05/17/22 1200  BP: (!) 82/60 114/78  103/72  Pulse: 60 66 70 70  Resp: 20 19  18   Temp: (!) 97.2 F (36.2 C)   (!) 96.3 F (35.7 C)  TempSrc: Axillary   Axillary  SpO2: 100% 100%  100%  Weight:      Height:        PHYSICAL  EXAM General: elderly and frail caucasian male, in no acute distress. Sitting up in bed without family present . HEENT:  Normocephalic and atraumatic. Neck:  No JVD.  Lungs: Normal respiratory effort on 2L [place]. Decreased breath sounds without crackles or wheezing Heart: HRRR. Normal S1 and S2 without gallops or murmurs.  Abdomen: Non-distended appearing.  Msk: Normal strength and tone for age. Extremities: Warm and well perfused. No clubbing, cyanosis. 2+ R and 1+ left lower extremity edema.  Neuro:  Alert and oriented to self and hospital  Psych:  somnolent, falls asleep during interview, but awakens to voice.   Labs: Basic Metabolic Panel: Recent Labs    05/16/22 0419 05/17/22 0522  NA 138 139  K 3.9 3.9  CL 103 105  CO2 24 26  GLUCOSE 114* 89  BUN 40* 37*  CREATININE 1.55* 1.43*  CALCIUM 9.1 8.8*   Liver Function Tests: No results for input(s): "AST", "ALT", "ALKPHOS", "BILITOT", "PROT", "ALBUMIN" in the last 72 hours. No results for input(s): "LIPASE", "AMYLASE" in the last 72 hours. CBC: Recent Labs    05/16/22 0419 05/17/22 0522  WBC 6.2 6.2  HGB 13.6 12.9*  HCT 42.8 42.0  MCV 93.4 97.7  PLT 142* 102*   Cardiac Enzymes: No results for input(s): "CKTOTAL", "CKMB", "CKMBINDEX", "TROPONINIHS" in the last 72 hours. BNP: No results for input(s): "BNP" in the last 72 hours. D-Dimer: No results for input(s): "DDIMER" in the last 72 hours. Hemoglobin A1C: No results for input(s): "HGBA1C" in the last 72 hours. Fasting Lipid Panel: No results for input(s): "CHOL", "HDL", "LDLCALC", "TRIG", "CHOLHDL", "LDLDIRECT" in the last 72 hours. Thyroid Function Tests: No results for input(s): "TSH", "T4TOTAL", "T3FREE", "THYROIDAB" in the last 72 hours.  Invalid input(s): "FREET3" Anemia Panel: No results for input(s): "VITAMINB12", "FOLATE", "FERRITIN", "TIBC", "IRON", "RETICCTPCT" in the last 72 hours.   Radiology: CT HEAD CODE STROKE WO CONTRAST`  Result Date:  05/16/2022 CLINICAL DATA:  Code stroke.  89 year old male EXAM: CT HEAD WITHOUT CONTRAST TECHNIQUE: Contiguous axial images were obtained from the base of the skull through the vertex without intravenous contrast. RADIATION DOSE REDUCTION: This exam was performed according to the departmental dose-optimization program which includes automated exposure control, adjustment of the mA and/or kV according to patient size and/or use of iterative reconstruction technique. COMPARISON:  Head CT 06/12/2021. FINDINGS: Brain: Stable cerebral volume from last year. No midline shift, mass effect, or evidence of intracranial mass lesion. No acute intracranial hemorrhage identified. No ventriculomegaly. No cortically based acute infarct identified. Gray-white differentiation does not appear significantly changed from last year, mild for age small vessel disease changes. Vascular: Extensive Calcified atherosclerosis at the skull base. No suspicious intracranial vascular hyperdensity. Skull: No acute osseous abnormality identified. Sinuses/Orbits: Visualized paranasal sinuses and mastoids are stable and well aerated. Other: Visualized orbits and scalp soft tissues are within normal limits. ASPECTS [facility] Stroke Program Early CT Score) Total score (0-10 with 10 being normal): 10 IMPRESSION: 1. No acute cortically based infarct or acute intracranial hemorrhage identified. ASPECTS 10. 2. Mild for age small vessel disease changes. Electronically Signed   By: Odessa Fleming M.D.   On: 05/16/2022 10:24   ECHOCARDIOGRAM COMPLETE  Result Date: 05/13/2022    ECHOCARDIOGRAM REPORT   Patient Name:   Spencer Coleman Date of Exam: 05/13/2022 Medical Rec #:  [id number]    Height:       70.0 in Accession #:    [id number]   Weight:       153.7 lb Date of Birth:  1930/04/17     BSA:          1.866 m Patient Age:    89 years     BP:           89/71 mmHg Patient Gender: M            HR:           75 bpm. Exam Location:  [facility] Procedure: 2D Echo, Cardiac Doppler,  Color Doppler and Strain Analysis Indications:     Abnormal ECG  History:         Patient has prior history of Echocardiogram examinations, most                  recent 06/13/2021. CHF, CAD, Abnormal ECG and Defibrillator,                  Arrythmias:Atrial Fibrillation and Tachycardia; Risk                  Factors:Dyslipidemia and Hypertension.  Sonographer:     Mikki Harbor Referring Phys:  [id number] DANA G NELSON Diagnosing Phys: Adrian Blackwater IMPRESSIONS  1. Left ventricular ejection fraction, by estimation, is 20 to 25%. The left ventricle has severely decreased function. The left ventricle demonstrates global hypokinesis. The left ventricular internal cavity size was severely dilated. There is moderate  left ventricular hypertrophy. Left ventricular diastolic parameters are consistent with Grade II diastolic dysfunction (pseudonormalization).  2. Right ventricular systolic function is moderately reduced. The right ventricular size is moderately enlarged. Mildly increased right ventricular wall thickness. There is normal pulmonary artery systolic pressure.  3. Left atrial size was severely dilated.  4. Right atrial size was severely dilated.  5. The mitral valve is myxomatous. Severe mitral valve regurgitation.  6. Tricuspid valve regurgitation is mild to moderate.  7. The aortic valve is calcified. Aortic valve regurgitation is moderate.  8. Pulmonic valve regurgitation is moderate.  9. Aortic dilatation noted. Conclusion(s)/Recommendation(s): Findings consistent with dilated cardiomyopathy. FINDINGS  Left Ventricle: Left ventricular ejection fraction, by estimation, is 20 to 25%. The left ventricle has severely decreased function. The left ventricle demonstrates global hypokinesis. The left ventricular internal cavity size was severely dilated. There is moderate left ventricular hypertrophy. Left ventricular diastolic parameters are consistent with Grade II diastolic dysfunction (pseudonormalization). Right  Ventricle: The right ventricular size is moderately enlarged. Mildly increased right ventricular wall thickness. Right ventricular systolic function is moderately reduced. There is normal pulmonary artery systolic pressure. The tricuspid regurgitant velocity is 2.61 m/s, and with an assumed right atrial pressure of 8 mmHg, the estimated right ventricular systolic pressure is 35.2 mmHg. Left Atrium: Left atrial size was severely dilated. Right Atrium: Right atrial size was severely dilated. Pericardium: There is no evidence of pericardial effusion. Mitral Valve: The mitral valve is myxomatous. Severe mitral valve regurgitation. MV peak gradient, 2.5 mmHg. The mean mitral valve gradient is 1.0 mmHg. Tricuspid Valve: The tricuspid valve is grossly normal. Tricuspid valve regurgitation is mild to moderate. Aortic Valve: The aortic valve is calcified. Aortic valve regurgitation is moderate. Aortic regurgitation PHT measures 414 msec. Aortic valve mean gradient measures 6.5 mmHg. Aortic valve peak gradient measures 12.7 mmHg. Aortic valve area, by VTI measures 1.91 cm. Pulmonic Valve: The pulmonic valve was grossly normal. Pulmonic valve regurgitation is moderate. Aorta: Aortic dilatation noted. IAS/Shunts: No atrial level shunt detected by color flow Doppler.  LEFT VENTRICLE PLAX 2D LVIDd:         7.00 cm      Diastology LVIDs:         6.50 cm      LV e' medial:    3.16 cm/s LV PW:         0.80 cm      LV E/e' medial:  24.9 LV IVS:        0.90 cm      LV e' lateral:   4.70 cm/s LVOT diam:  2.00 cm      LV E/e' lateral: 16.7 LV SV:         68 LV SV Index:   37 LVOT Area:     3.14 cm  LV Volumes (MOD) LV vol d, MOD A2C: 222.0 ml LV vol d, MOD A4C: 177.0 ml LV vol s, MOD A2C: 165.0 ml LV vol s, MOD A4C: 137.0 ml LV SV MOD A2C:     57.0 ml LV SV MOD A4C:     177.0 ml LV SV MOD BP:      46.7 ml RIGHT VENTRICLE RV Basal diam:  3.65 cm RV Mid diam:    3.30 cm RV S prime:     10.20 cm/s TAPSE (M-mode): 1.3 cm LEFT ATRIUM               Index        RIGHT ATRIUM           Index LA diam:        5.40 cm  2.89 cm/m   RA Area:     15.60 cm LA Vol (A2C):   93.3 ml  50.00 ml/m  RA Volume:   42.60 ml  22.83 ml/m LA Vol (A4C):   100.0 ml 53.59 ml/m LA Biplane Vol: 103.0 ml 55.19 ml/m  AORTIC VALVE                     PULMONIC VALVE AV Area (Vmax):    2.07 cm      PV Vmax:       0.70 m/s AV Area (Vmean):   1.92 cm      PV Peak grad:  1.9 mmHg AV Area (VTI):     1.91 cm AV Vmax:           178.00 cm/s AV Vmean:          121.500 cm/s AV VTI:            0.358 m AV Peak Grad:      12.7 mmHg AV Mean Grad:      6.5 mmHg LVOT Vmax:         117.50 cm/s LVOT Vmean:        74.300 cm/s LVOT VTI:          0.218 m LVOT/AV VTI ratio: 0.61 AI PHT:            414 msec  AORTA Ao Root diam: 3.40 cm Ao Asc diam:  3.40 cm MITRAL VALVE                  TRICUSPID VALVE MV Area (PHT): 5.27 cm       TR Peak grad:   27.2 mmHg MV Area VTI:   3.05 cm       TR Vmax:        261.00 cm/s MV Peak grad:  2.5 mmHg MV Mean grad:  1.0 mmHg       SHUNTS MV Vmax:       0.79 m/s       Systemic VTI:  0.22 m MV Vmean:      48.4 cm/s      Systemic Diam: 2.00 cm MV Decel Time: 144 msec MR Peak grad:    86.9 mmHg MR Mean grad:    55.0 mmHg MR Vmax:         466.00 cm/s MR Vmean:        349.0 cm/s MR PISA:  4.02 cm MR PISA Eff ROA: 80 mm MR PISA Radius:  0.80 cm MV E velocity: 78.60 cm/s MV A velocity: 56.10 cm/s MV E/A ratio:  1.40 Shaukat Khan Electronically signed by Adrian Blackwater Signature Date/Time: 05/13/2022/12:18:19 PM    Final    US Venous Img Lower Unilateral Right  Result Date: 05/12/2022 CLINICAL DATA:  Leg swelling EXAM: RIGHT LOWER EXTREMITY VENOUS DOPPLER ULTRASOUND TECHNIQUE: Gray-scale sonography with compression, as well as color and duplex ultrasound, were performed to evaluate the deep venous system(s) from the level of the common femoral vein through the popliteal and proximal calf veins. COMPARISON:  None Available. FINDINGS: VENOUS Normal  compressibility of the common femoral, superficial femoral, and popliteal veins, as well as the visualized calf veins. Visualized portions of profunda femoral vein and great saphenous vein unremarkable. No filling defects to suggest DVT on grayscale or color Doppler imaging. Doppler waveforms show normal direction of venous flow, normal respiratory plasticity and response to augmentation. Limited views of the contralateral common femoral vein are unremarkable. OTHER Soft tissue edema of the lower right leg. Limitations: none IMPRESSION: No evidence of right lower extremity DVT. Electronically Signed   By: Allegra Lai M.D.   On: 05/12/2022 16:58   DG Chest Portable 1 View  Result Date: 05/12/2022 CLINICAL DATA:  Shortness of breath EXAM: PORTABLE CHEST 1 VIEW COMPARISON:  X-ray 01/10/2022 FINDINGS: Left upper chest defibrillator. Heart enlarged. Calcified and tortuous aorta. Mild vascular congestion which is new from previous. Elevated right hemidiaphragm with a tiny right effusion and adjacent atelectasis. No pneumothorax. Overlapping cardiac leads. Osteopenia. IMPRESSION: Developing vascular congestion.  Tiny right effusion. Enlarged heart with calcified aorta and defibrillator Electronically Signed   By: Karen Kays M.D.   On: 05/12/2022 13:55    ECHO EF 20-25%  TELEMETRY reviewed by me (LT) 05/17/2022 : periods of NSR and AV paced rhythm with ~45 mins of a wide complex tachycardia, likely VT this AM   EKG reviewed by me: sinus tach with rate 124BPM with RBBB   Data reviewed by me (LT) 05/17/2022: ed note, admission H&P last 24h vitals tele labs imaging I/O    Principal Problem:   Acute hypoxic respiratory failure (HCC)    ASSESSMENT AND PLAN:  Spencer Coleman is an 89yoM with a PMH of chronic HFrEF / NICM, hx VT s/p BiV ICD, paroxysmal AF, CKD 3, HTN, HLD who presented to [facility] ED 05/12/2022 with shortness of breath. Tele strips in the ED noted a wide complex tachycardia that did not terminate  with IV adenosine, most c/w VT. BNP elevated to 3000 on admission and CXR with vascular congestion c/f acute on chronic HFrEF.   # acute on chronic HFrEF  # s/p ICD Clinically hypervolemic on exam with 2+ pitting LE edema and a 2L oxygen requirement with none at baseline.  - Continue diuresis today with IV lasix 40mg  BID  - monitor and replenish electrolytes for a goal K>4 and mag > 4 - continue GDMT with metoprolol and losartan, SGLT2i and MRA limited by BP/CKD   # recurrent VT  # paroxysmal AF  Known history of VT with recurrence historically in the setting of noncompliance with amiodarone. Had a long ( ) period of a wide complex tachycardia on tele this AM, also likely VT. Noted the patient's defibrillator settings will not shock him out of VT, but will pace him out of VF.  - increase amiodarone to 400mg  BID x 10 days, then 200mg  daily thereafter - give 2g  IV mag now - continue metoprolol as ordered  - continue eliquis 2.5mg  BID for stroke prevention   # CKD 3  Renal function improving with diuresis. Currently BUN/CR 37/1.43 and GFR 47. Continue to monitor closely.   This patient's plan of care was discussed and created with Dr. Darrold Junker and he is in agreement.  Signed: Rebeca Allegra , PA-C 05/17/2022, 1:24 PM [facility]

## 2022-05-18 ENCOUNTER — Encounter: Payer: Self-pay | Admitting: Internal Medicine

## 2022-05-18 DIAGNOSIS — R000 Tachycardia, unspecified: Secondary | ICD-10-CM | POA: Diagnosis not present

## 2022-05-18 DIAGNOSIS — I480 Paroxysmal atrial fibrillation: Secondary | ICD-10-CM | POA: Diagnosis not present

## 2022-05-18 DIAGNOSIS — J9601 Acute respiratory failure with hypoxia: Secondary | ICD-10-CM | POA: Diagnosis not present

## 2022-05-18 DIAGNOSIS — I5023 Acute on chronic systolic (congestive) heart failure: Secondary | ICD-10-CM | POA: Diagnosis not present

## 2022-05-18 DIAGNOSIS — Z66 Do not resuscitate: Secondary | ICD-10-CM

## 2022-05-18 DIAGNOSIS — Z515 Encounter for palliative care: Secondary | ICD-10-CM | POA: Diagnosis not present

## 2022-05-18 DIAGNOSIS — I509 Heart failure, unspecified: Secondary | ICD-10-CM | POA: Diagnosis not present

## 2022-05-18 LAB — CBC
HCT: 41.2 % (ref 39.0–52.0)
Hemoglobin: 13.1 g/dL (ref 13.0–17.0)
MCH: 29.8 pg (ref 26.0–34.0)
MCHC: 31.8 g/dL (ref 30.0–36.0)
MCV: 93.6 fL (ref 80.0–100.0)
Platelets: 121 10*3/uL — ABNORMAL LOW (ref 150–400)
RBC: 4.4 MIL/uL (ref 4.22–5.81)
RDW: 15.6 % — ABNORMAL HIGH (ref 11.5–15.5)
WBC: 8.1 10*3/uL (ref 4.0–10.5)
nRBC: 0 % (ref 0.0–0.2)

## 2022-05-18 LAB — BASIC METABOLIC PANEL
Anion gap: 7 (ref 5–15)
BUN: 35 mg/dL — ABNORMAL HIGH (ref 8–23)
CO2: 30 mmol/L (ref 22–32)
Calcium: 8.6 mg/dL — ABNORMAL LOW (ref 8.9–10.3)
Chloride: 102 mmol/L (ref 98–111)
Creatinine, Ser: 1.49 mg/dL — ABNORMAL HIGH (ref 0.61–1.24)
GFR, Estimated: 45 mL/min — ABNORMAL LOW (ref >60)
Glucose, Bld: 116 mg/dL — ABNORMAL HIGH (ref 70–99)
Potassium: 3.8 mmol/L (ref 3.5–5.1)
Sodium: 139 mmol/L (ref 135–145)

## 2022-05-18 LAB — MAGNESIUM: Magnesium: 2.8 mg/dL — ABNORMAL HIGH (ref 1.7–2.4)

## 2022-05-18 MED ORDER — POTASSIUM CHLORIDE CRYS ER 20 MEQ PO TBCR: 40.0000 meq | EXTENDED_RELEASE_TABLET | Freq: Once | ORAL | Status: DC

## 2022-05-18 MED ORDER — POTASSIUM CHLORIDE 20 MEQ PO PACK
40.0000 meq | PACK | Freq: Once | ORAL | Status: AC
  Administered 2022-05-18: 40 meq via ORAL
  Filled 2022-05-18: qty 2

## 2022-05-18 MED ORDER — TRAZODONE HCL 50 MG PO TABS
25.0000 mg | ORAL_TABLET | Freq: Once | ORAL | Status: AC
  Administered 2022-05-18: 25 mg via ORAL
  Filled 2022-05-18: qty 1

## 2022-05-18 NOTE — Progress Notes (Signed)
Occupational Therapy Treatment Patient Details Name: Spencer Coleman MRN: [id number] DOB: 1930/11/25 Today's Date: 05/18/2022   History of present illness Pt is an 89 y.o. male with history of hypertension, hyperlipidemia, CKD, CHF, AICD, work up for acute on chronic systolic CHF exacerbation.   OT comments  Patient received semi-reclined in bed and agreeable to OT. Pt found with nasal cannula falling out of nose and SpO2 reading at high 70s to low 80s. Pt placed back on 2L and SpO2 improving to 85%, but with poor pleth. Pt denied any SOB and HR WNL t/o activity. Pt completed bed mobility with Mod I and then engaged in seated grooming tasks with set up-supervision. Pt stood from EOB with Min A and took lateral steps toward [facility] with CGA using a RW. Pt left as received with all needs in reach. Pt is making progress toward goal completion. D/C recommendation remains appropriate. OT will continue to follow acutely.    Recommendations for follow up therapy are one component of a multi-disciplinary discharge planning process, led by the attending physician.  Recommendations may be updated based on patient status, additional functional criteria and insurance authorization.    Assistance Recommended at Discharge Intermittent Supervision/Assistance  Patient can return home with the following  A little help with bathing/dressing/bathroom;A little help with walking and/or transfers;Assistance with cooking/housework;Assist for transportation   Equipment Recommendations  None recommended by OT    Recommendations for Other Services      Precautions / Restrictions Precautions Precautions: Fall Restrictions Weight Bearing Restrictions: No       Mobility Bed Mobility Overal bed mobility: Modified Independent                  Transfers Overall transfer level: Needs assistance Equipment used: Rolling walker (2 wheels) Transfers: Sit to/from Stand Sit to Stand: Min assist           General  transfer comment: VC for hand placement, Min A for lower bed surface     Balance Overall balance assessment: Needs assistance Sitting-balance support: Feet supported Sitting balance-Leahy Scale: Good     Standing balance support: Bilateral upper extremity supported, During functional activity, Reliant on assistive device for balance Standing balance-Leahy Scale: Fair       ADL either performed or assessed with clinical judgement   ADL Overall ADL's : Needs assistance/impaired     Grooming: Set up;Supervision/safety;Sitting;Wash/dry face;Brushing hair Grooming Details (indicate cue type and reason): completed in sitting due to not being able to get accurate SpO2 with activity                 Toilet Transfer: Min guard;Minimal assistance;Ambulation;Rolling walker (2 wheels) Toilet Transfer Details (indicate cue type and reason): simulated         Functional mobility during ADLs: Min guard;Rolling walker (2 wheels) (to take lateral steps at EOB)      Extremity/Trunk Assessment Upper Extremity Assessment Upper Extremity Assessment: Generalized weakness   Lower Extremity Assessment Lower Extremity Assessment: Generalized weakness   Cervical / Trunk Assessment Cervical / Trunk Assessment: Normal    Vision Patient Visual Report: No change from baseline     Perception     Praxis      Cognition Arousal/Alertness: Awake/alert Behavior During Therapy: WFL for tasks assessed/performed Overall Cognitive Status: Within Functional Limits for tasks assessed        Exercises      Shoulder Instructions       General Comments Pt received with nasal cannula falling out  of nose and SpO2 reading at high 70s to low 80s. Pt placed back on 2L and SpO2 improving to 85%, but with poor pleth. Pt denied any SOB. HR WNL t/o activity.    Pertinent Vitals/ Pain       Pain Assessment Pain Assessment: No/denies pain  Home Living          Prior Functioning/Environment               Frequency  Min 1X/week        Progress Toward Goals  OT Goals(current goals can now be found in the care plan section)  Progress towards OT goals: Progressing toward goals  Acute Rehab OT Goals Patient Stated Goal: Go home OT Goal Formulation: With patient Time For Goal Achievement: 05/28/22 Potential to Achieve Goals: Good  Plan Discharge plan remains appropriate;Frequency needs to be updated    Co-evaluation                 AM-PAC OT "6 Clicks" Daily Activity     Outcome Measure   Help from another person eating meals?: None Help from another person taking care of personal grooming?: None Help from another person toileting, which includes using toliet, bedpan, or urinal?: A Little Help from another person bathing (including washing, rinsing, drying)?: A Little Help from another person to put on and taking off regular upper body clothing?: None Help from another person to put on and taking off regular lower body clothing?: None 6 Click Score: 22    End of Session Equipment Utilized During Treatment: Rolling walker (2 wheels);Gait belt  OT Visit Diagnosis: Unsteadiness on feet (R26.81)   Activity Tolerance Patient tolerated treatment well   Patient Left in bed;with call bell/phone within reach;with bed alarm set   Nurse Communication Mobility status        Time: 1610-9604 OT Time Calculation (min): 21 min  Charges: OT General Charges $OT Visit: 1 Visit OT Treatments $Self Care/Home Management : 8-22 mins  [facility] MS, OTR/L ascom [phone]  05/18/22, 12:41 PM

## 2022-05-18 NOTE — Progress Notes (Signed)
[facility] CARDIOLOGY CONSULT NOTE       Patient ID: Spencer Coleman MRN: [id number] DOB/AGE: [birth year] 89 y.o.  Admit date: 05/12/2022 Referring Physician Dr. Artis Delay Primary Physician Dr. Marcello Fennel Primary Cardiologist Dr. Darrold Junker Reason for Consultation wide complex tachycardia   HPI: Spencer Coleman is an 89yoM with a PMH of chronic HFrEF / NICM, hx VT s/p BiV ICD, paroxysmal AF, CKD 3, HTN, HLD who presented to [facility] ED 05/12/2022 with shortness of breath. Tele strips in the ED noted a wide complex tachycardia that did not terminate with IV adenosine, most c/w VT. BNP elevated to 3000 on admission and CXR with vascular congestion c/w acute on chronic HFrEF.   Interval history:  -Overnight events noted, continues to have extended runs of VT, patient denies dizziness, palpitations.  -Feeling well today, remains on 2L [place]. Denies CP, SOB. Patient's sister is visiting at the time of my evaluation. She does report that he is demented at baseline and has been having more falls at home recently. Patient lives alone.   Review of systems complete and found to be negative unless listed above     Past Medical History:  Diagnosis Date   CAD (coronary artery disease)    Nonobstructive cardiac disease by cath   CHF (congestive heart failure) (HCC)    nonischemic cardiomyopathy   CKD (chronic kidney disease)    History of kidney stones    Hyperlipidemia    Hypertension    Inguinal hernia    PAF (paroxysmal atrial fibrillation) (HCC)    Patella fracture 01/09/2015   Renal colic 10/07/2011   Ventricular tachycardia [facility])    s/p ICD    Past Surgical History:  Procedure Laterality Date   CARDIAC CATHETERIZATION     CARDIAC DEFIBRILLATOR PLACEMENT  2009   CYSTOSCOPY WITH LITHOLAPAXY N/A 02/21/2018   Procedure: CYSTOSCOPY WITH LITHOLAPAXY;  Surgeon: Sondra Come, MD;  Location: [facility] ORS;  Service: Urology;  Laterality: N/A;   ELBOW SURGERY Left    HOLMIUM LASER APPLICATION N/A  02/21/2018   Procedure: HOLMIUM LASER APPLICATION;  Surgeon: Sondra Come, MD;  Location: [facility] ORS;  Service: Urology;  Laterality: N/A;   ICD GENERATOR CHANGEOUT N/A 01/28/2021   Procedure: ICD GENERATOR CHANGEOUT;  Surgeon: Marcina Millard, MD;  Location: [facility] INVASIVE CV LAB;  Service: Cardiovascular;  Laterality: N/A;   INGUINAL HERNIA REPAIR     LITHOTRIPSY     ORIF left wrist fracture Left    ORIF PATELLA Left 01/10/2015   Procedure: OPEN REDUCTION INTERNAL (ORIF) FIXATION PATELLA;  Surgeon: Donato Heinz, MD;  Location: [facility] ORS;  Service: Orthopedics;  Laterality: Left;   ORIF PATELLA Left 02/06/2015   Procedure: OPEN REDUCTION INTERNAL (ORIF) FIXATION PATELLA;  Surgeon: Donato Heinz, MD;  Location: [facility] ORS;  Service: Orthopedics;  Laterality: Left;   TRANSURETHRAL RESECTION OF PROSTATE N/A 02/21/2018   Procedure: TRANSURETHRAL RESECTION OF THE PROSTATE (TURP);  Surgeon: Sondra Come, MD;  Location: [facility] ORS;  Service: Urology;  Laterality: N/A;    Medications Prior to Admission  Medication Sig Dispense Refill Last Dose   acetaminophen (TYLENOL) 500 MG tablet Take 2 tablets (1,000 mg total) by mouth every 6 (six) hours. 30 tablet 0 unknown   amiodarone (PACERONE) 400 MG tablet Take 400 mg by mouth daily.   05/11/2022   apixaban (ELIQUIS) 2.5 MG TABS tablet Take 1 tablet (2.5 mg total) by mouth 2 (two) times daily. 60 tablet 1 05/11/2022   furosemide (LASIX) 20  MG tablet Take 20 mg by mouth daily.   05/11/2022   HYDROcodone-acetaminophen (NORCO/VICODIN) 5-325 MG tablet Take 1 tablet by mouth every 6 (six) hours as needed for moderate pain.   unknown   metoprolol succinate (TOPROL-XL) 25 MG 24 hr tablet Take 25 mg by mouth daily.   05/11/2022   simvastatin (ZOCOR) 40 MG tablet Take 40 mg by mouth at bedtime.   05/11/2022   Vitamin D, Ergocalciferol, (DRISDOL) 1.25 MG (50000 UNIT) CAPS capsule Take 50,000 Units by mouth once a week.   Past Week   Social History   Socioeconomic  History   Marital status: Single    Spouse name: Not on file   Number of children: Not on file   Years of education: Not on file   Highest education level: Not on file  Occupational History   Occupation: retired  Tobacco Use   Smoking status: Never   Smokeless tobacco: Never  Vaping Use   Vaping Use: Never used  Substance and Sexual Activity   Alcohol use: No    Alcohol/week: 0.0 standard drinks of alcohol   Drug use: No   Sexual activity: Not on file  Other Topics Concern   Not on file  Social History Narrative   Lives at home. Independent, no assisted devices at baseline   Social Determinants of Health   Financial Resource Strain: Not on file  Food Insecurity: No Food Insecurity (05/16/2022)   Hunger Vital Sign    Worried About Running Out of Food in the Last Year: Never true    Ran Out of Food in the Last Year: Never true  Transportation Needs: No Transportation Needs (05/16/2022)   PRAPARE - Administrator, Civil Service (Medical): No    Lack of Transportation (Non-Medical): No  Physical Activity: Not on file  Stress: Not on file  Social Connections: Not on file  Intimate Partner Violence: Not At Risk (05/16/2022)   Humiliation, Afraid, Rape, and Kick questionnaire    Fear of Current or Ex-Partner: No    Emotionally Abused: No    Physically Abused: No    Sexually Abused: No    Family History  Problem Relation Age of Onset   CAD Mother    Kidney cancer Father    Prostate cancer Neg Hx    Bladder Cancer Neg Hx       Intake/Output Summary (Last 24 hours) at 05/18/2022 1301 Last data filed at 05/18/2022 0600 Gross per 24 hour  Intake 700 ml  Output 2550 ml  Net -1850 ml    Vitals:   05/17/22 1948 05/18/22 0108 05/18/22 0400 05/18/22 0740  BP: 101/72 98/61 106/71 112/64  Pulse:  (!) 48 68 69  Resp: 17 18 16 19   Temp: 97.8 F (36.6 C) 98.2 F (36.8 C) 97.6 F (36.4 C) 97.6 F (36.4 C)  TempSrc: Axillary Axillary Axillary Axillary  SpO2: 100% 92%  100% 97%  Weight:   69.8 kg   Height:        PHYSICAL EXAM General: elderly and frail caucasian male, in no acute distress. Sitting up in chair with sister present. HEENT:  Normocephalic and atraumatic. Neck:  No JVD.  Lungs: Normal respiratory effort on 2L [place]. Decreased breath sounds without crackles or wheezing Heart: HRRR. Normal S1 and S2 without gallops or murmurs.  Abdomen: Non-distended appearing.  Msk: Normal strength and tone for age. Extremities: Warm and well perfused. No clubbing, cyanosis. 2+ R and 1+ left lower extremity edema.  Neuro: Alert and oriented to self and hospital  Psych:  Pleasant affect.   Labs: Basic Metabolic Panel: Recent Labs    05/17/22 0522 05/18/22 0457  NA 139 139  K 3.9 3.8  CL 105 102  CO2 26 30  GLUCOSE 89 116*  BUN 37* 35*  CREATININE 1.43* 1.49*  CALCIUM 8.8* 8.6*   Liver Function Tests: No results for input(s): "AST", "ALT", "ALKPHOS", "BILITOT", "PROT", "ALBUMIN" in the last 72 hours. No results for input(s): "LIPASE", "AMYLASE" in the last 72 hours. CBC: Recent Labs    05/17/22 0522 05/18/22 0457  WBC 6.2 8.1  HGB 12.9* 13.1  HCT 42.0 41.2  MCV 97.7 93.6  PLT 102* 121*   Cardiac Enzymes: No results for input(s): "CKTOTAL", "CKMB", "CKMBINDEX", "TROPONINIHS" in the last 72 hours. BNP: No results for input(s): "BNP" in the last 72 hours. D-Dimer: No results for input(s): "DDIMER" in the last 72 hours. Hemoglobin A1C: No results for input(s): "HGBA1C" in the last 72 hours. Fasting Lipid Panel: No results for input(s): "CHOL", "HDL", "LDLCALC", "TRIG", "CHOLHDL", "LDLDIRECT" in the last 72 hours. Thyroid Function Tests: No results for input(s): "TSH", "T4TOTAL", "T3FREE", "THYROIDAB" in the last 72 hours.  Invalid input(s): "FREET3" Anemia Panel: No results for input(s): "VITAMINB12", "FOLATE", "FERRITIN", "TIBC", "IRON", "RETICCTPCT" in the last 72 hours.   Radiology: CT HEAD CODE STROKE WO CONTRAST`  Result  Date: 05/16/2022 CLINICAL DATA:  Code stroke.  89 year old male EXAM: CT HEAD WITHOUT CONTRAST TECHNIQUE: Contiguous axial images were obtained from the base of the skull through the vertex without intravenous contrast. RADIATION DOSE REDUCTION: This exam was performed according to the departmental dose-optimization program which includes automated exposure control, adjustment of the mA and/or kV according to patient size and/or use of iterative reconstruction technique. COMPARISON:  Head CT 06/12/2021. FINDINGS: Brain: Stable cerebral volume from last year. No midline shift, mass effect, or evidence of intracranial mass lesion. No acute intracranial hemorrhage identified. No ventriculomegaly. No cortically based acute infarct identified. Gray-white differentiation does not appear significantly changed from last year, mild for age small vessel disease changes. Vascular: Extensive Calcified atherosclerosis at the skull base. No suspicious intracranial vascular hyperdensity. Skull: No acute osseous abnormality identified. Sinuses/Orbits: Visualized paranasal sinuses and mastoids are stable and well aerated. Other: Visualized orbits and scalp soft tissues are within normal limits. ASPECTS [facility] Stroke Program Early CT Score) Total score (0-10 with 10 being normal): 10 IMPRESSION: 1. No acute cortically based infarct or acute intracranial hemorrhage identified. ASPECTS 10. 2. Mild for age small vessel disease changes. Electronically Signed   By: Odessa Fleming M.D.   On: 05/16/2022 10:24   ECHOCARDIOGRAM COMPLETE  Result Date: 05/13/2022    ECHOCARDIOGRAM REPORT   Patient Name:   Spencer Coleman Date of Exam: 05/13/2022 Medical Rec #:  [id number]    Height:       70.0 in Accession #:    [id number]   Weight:       153.7 lb Date of Birth:  1930-11-05     BSA:          1.866 m Patient Age:    89 years     BP:           89/71 mmHg Patient Gender: M            HR:           75 bpm. Exam Location:  [facility] Procedure: 2D Echo, Cardiac  Doppler, Color Doppler and Strain Analysis Indications:  Abnormal ECG  History:         Patient has prior history of Echocardiogram examinations, most                  recent 06/13/2021. CHF, CAD, Abnormal ECG and Defibrillator,                  Arrythmias:Atrial Fibrillation and Tachycardia; Risk                  Factors:Dyslipidemia and Hypertension.  Sonographer:     Mikki Harbor Referring Phys:  [id number] DANA G NELSON Diagnosing Phys: Adrian Blackwater IMPRESSIONS  1. Left ventricular ejection fraction, by estimation, is 20 to 25%. The left ventricle has severely decreased function. The left ventricle demonstrates global hypokinesis. The left ventricular internal cavity size was severely dilated. There is moderate  left ventricular hypertrophy. Left ventricular diastolic parameters are consistent with Grade II diastolic dysfunction (pseudonormalization).  2. Right ventricular systolic function is moderately reduced. The right ventricular size is moderately enlarged. Mildly increased right ventricular wall thickness. There is normal pulmonary artery systolic pressure.  3. Left atrial size was severely dilated.  4. Right atrial size was severely dilated.  5. The mitral valve is myxomatous. Severe mitral valve regurgitation.  6. Tricuspid valve regurgitation is mild to moderate.  7. The aortic valve is calcified. Aortic valve regurgitation is moderate.  8. Pulmonic valve regurgitation is moderate.  9. Aortic dilatation noted. Conclusion(s)/Recommendation(s): Findings consistent with dilated cardiomyopathy. FINDINGS  Left Ventricle: Left ventricular ejection fraction, by estimation, is 20 to 25%. The left ventricle has severely decreased function. The left ventricle demonstrates global hypokinesis. The left ventricular internal cavity size was severely dilated. There is moderate left ventricular hypertrophy. Left ventricular diastolic parameters are consistent with Grade II diastolic dysfunction  (pseudonormalization). Right Ventricle: The right ventricular size is moderately enlarged. Mildly increased right ventricular wall thickness. Right ventricular systolic function is moderately reduced. There is normal pulmonary artery systolic pressure. The tricuspid regurgitant velocity is 2.61 m/s, and with an assumed right atrial pressure of 8 mmHg, the estimated right ventricular systolic pressure is 35.2 mmHg. Left Atrium: Left atrial size was severely dilated. Right Atrium: Right atrial size was severely dilated. Pericardium: There is no evidence of pericardial effusion. Mitral Valve: The mitral valve is myxomatous. Severe mitral valve regurgitation. MV peak gradient, 2.5 mmHg. The mean mitral valve gradient is 1.0 mmHg. Tricuspid Valve: The tricuspid valve is grossly normal. Tricuspid valve regurgitation is mild to moderate. Aortic Valve: The aortic valve is calcified. Aortic valve regurgitation is moderate. Aortic regurgitation PHT measures 414 msec. Aortic valve mean gradient measures 6.5 mmHg. Aortic valve peak gradient measures 12.7 mmHg. Aortic valve area, by VTI measures 1.91 cm. Pulmonic Valve: The pulmonic valve was grossly normal. Pulmonic valve regurgitation is moderate. Aorta: Aortic dilatation noted. IAS/Shunts: No atrial level shunt detected by color flow Doppler.  LEFT VENTRICLE PLAX 2D LVIDd:         7.00 cm      Diastology LVIDs:         6.50 cm      LV e' medial:    3.16 cm/s LV PW:         0.80 cm      LV E/e' medial:  24.9 LV IVS:        0.90 cm      LV e' lateral:   4.70 cm/s LVOT diam:     2.00 cm      LV  E/e' lateral: 16.7 LV SV:         68 LV SV Index:   37 LVOT Area:     3.14 cm  LV Volumes (MOD) LV vol d, MOD A2C: 222.0 ml LV vol d, MOD A4C: 177.0 ml LV vol s, MOD A2C: 165.0 ml LV vol s, MOD A4C: 137.0 ml LV SV MOD A2C:     57.0 ml LV SV MOD A4C:     177.0 ml LV SV MOD BP:      46.7 ml RIGHT VENTRICLE RV Basal diam:  3.65 cm RV Mid diam:    3.30 cm RV S prime:     10.20 cm/s TAPSE  (M-mode): 1.3 cm LEFT ATRIUM              Index        RIGHT ATRIUM           Index LA diam:        5.40 cm  2.89 cm/m   RA Area:     15.60 cm LA Vol (A2C):   93.3 ml  50.00 ml/m  RA Volume:   42.60 ml  22.83 ml/m LA Vol (A4C):   100.0 ml 53.59 ml/m LA Biplane Vol: 103.0 ml 55.19 ml/m  AORTIC VALVE                     PULMONIC VALVE AV Area (Vmax):    2.07 cm      PV Vmax:       0.70 m/s AV Area (Vmean):   1.92 cm      PV Peak grad:  1.9 mmHg AV Area (VTI):     1.91 cm AV Vmax:           178.00 cm/s AV Vmean:          121.500 cm/s AV VTI:            0.358 m AV Peak Grad:      12.7 mmHg AV Mean Grad:      6.5 mmHg LVOT Vmax:         117.50 cm/s LVOT Vmean:        74.300 cm/s LVOT VTI:          0.218 m LVOT/AV VTI ratio: 0.61 AI PHT:            414 msec  AORTA Ao Root diam: 3.40 cm Ao Asc diam:  3.40 cm MITRAL VALVE                  TRICUSPID VALVE MV Area (PHT): 5.27 cm       TR Peak grad:   27.2 mmHg MV Area VTI:   3.05 cm       TR Vmax:        261.00 cm/s MV Peak grad:  2.5 mmHg MV Mean grad:  1.0 mmHg       SHUNTS MV Vmax:       0.79 m/s       Systemic VTI:  0.22 m MV Vmean:      48.4 cm/s      Systemic Diam: 2.00 cm MV Decel Time: 144 msec MR Peak grad:    86.9 mmHg MR Mean grad:    55.0 mmHg MR Vmax:         466.00 cm/s MR Vmean:        349.0 cm/s MR PISA:         4.02 cm MR  PISA Eff ROA: 80 mm MR PISA Radius:  0.80 cm MV E velocity: 78.60 cm/s MV A velocity: 56.10 cm/s MV E/A ratio:  1.40 Shaukat Khan Electronically signed by Adrian Blackwater Signature Date/Time: 05/13/2022/12:18:19 PM    Final    US Venous Img Lower Unilateral Right  Result Date: 05/12/2022 CLINICAL DATA:  Leg swelling EXAM: RIGHT LOWER EXTREMITY VENOUS DOPPLER ULTRASOUND TECHNIQUE: Gray-scale sonography with compression, as well as color and duplex ultrasound, were performed to evaluate the deep venous system(s) from the level of the common femoral vein through the popliteal and proximal calf veins. COMPARISON:  None Available.  FINDINGS: VENOUS Normal compressibility of the common femoral, superficial femoral, and popliteal veins, as well as the visualized calf veins. Visualized portions of profunda femoral vein and great saphenous vein unremarkable. No filling defects to suggest DVT on grayscale or color Doppler imaging. Doppler waveforms show normal direction of venous flow, normal respiratory plasticity and response to augmentation. Limited views of the contralateral common femoral vein are unremarkable. OTHER Soft tissue edema of the lower right leg. Limitations: none IMPRESSION: No evidence of right lower extremity DVT. Electronically Signed   By: Allegra Lai M.D.   On: 05/12/2022 16:58   DG Chest Portable 1 View  Result Date: 05/12/2022 CLINICAL DATA:  Shortness of breath EXAM: PORTABLE CHEST 1 VIEW COMPARISON:  X-ray 01/10/2022 FINDINGS: Left upper chest defibrillator. Heart enlarged. Calcified and tortuous aorta. Mild vascular congestion which is new from previous. Elevated right hemidiaphragm with a tiny right effusion and adjacent atelectasis. No pneumothorax. Overlapping cardiac leads. Osteopenia. IMPRESSION: Developing vascular congestion.  Tiny right effusion. Enlarged heart with calcified aorta and defibrillator Electronically Signed   By: Karen Kays M.D.   On: 05/12/2022 13:55    ECHO: EF 20-25%  TELEMETRY reviewed by me [facility]) 05/18/2022: AV paced rhythm in the 70s, with periods of wide complex tachycardia overnight lasting 30 minutes or greater.   Data reviewed by me [facility]) 05/18/2022: admission H&P last 24h vitals tele labs imaging I/O    Principal Problem:   Acute hypoxic respiratory failure (HCC)    ASSESSMENT AND PLAN:  Spencer Coleman is an 51yoM with a PMH of chronic HFrEF / NICM, hx VT s/p BiV ICD, paroxysmal AF, CKD 3, HTN, HLD who presented to [facility] ED 05/12/2022 with shortness of breath. Tele strips in the ED noted a wide complex tachycardia that did not terminate with IV adenosine, most c/w VT. BNP  elevated to 3000 on admission and CXR with vascular congestion c/w acute on chronic HFrEF.   # acute on chronic HFrEF  # s/p ICD Remains hypervolemic on exam with 2+ pitting LE edema and a 2L oxygen requirement with none at baseline. 2.5L output yesterday. - Continue diuresis today with IV lasix 40mg  BID - Wean O2 as volume status improves and as patient tolerates - Monitor and replenish electrolytes for a goal K > 4 and mag > 2 - Continue GDMT with metoprolol and losartan, SGLT2i and MRA limited by BP/CKD. Hold parameters in place for BP <100/60.   # recurrent VT  # paroxysmal AF  Known history of VT with recurrence historically in the setting of noncompliance with amiodarone. Had multiple periods of a wide complex tachycardia on tele overnight and this morning, also likely VT. Noted the patient's defibrillator settings will not shock him out of VT, but will pace him out of VF.  - increase amiodarone to 400mg  BID x 10 days starting 05/17/2022, then 200mg  daily thereafter -  2g IV mag given yesterday, will recheck today.  - continue metoprolol as ordered  - continue eliquis 2.5mg  BID for stroke prevention   # CKD 3  Renal function with slight bump this AM. Currently BUN/CR 35/1.49 and GFR 45. Continue to monitor closely.   This patient's plan of care was discussed and created with Dr. Darrold Junker and he is in agreement.  Signed: Gale Journey , PA-C 05/18/2022, 1:01 PM [facility]

## 2022-05-18 NOTE — Progress Notes (Signed)
Physical Therapy Treatment Patient Details Name: Spencer Coleman MRN: [id number] DOB: 05/11/1930 Today's Date: 05/18/2022   History of Present Illness Pt is an 89 y.o. male with history of hypertension, hyperlipidemia, CKD, CHF, AICD, work up for acute on chronic systolic CHF exacerbation.    PT Comments    Pt was pleasant and motivated to participate during the session and put forth good effort throughout.  Pt able to perform all functional tasks other than standing from a low height surface without physical assistance.  Pt able to amb 1 x 15' and 1 x 50' this session with no overt LOB and with no adverse symptoms.  Pt ambulated on 2LO2/min with poor pleth most of the time but with good signal SpO2 was in the upper 90s to 100%.   Pt's HR was WNL throughout the session.  Pt will benefit from continued PT services upon discharge to safely address deficits listed in patient problem list for decreased caregiver assistance and eventual return to PLOF.     Recommendations for follow up therapy are one component of a multi-disciplinary discharge planning process, led by the attending physician.  Recommendations may be updated based on patient status, additional functional criteria and insurance authorization.  Follow Up Recommendations       Assistance Recommended at Discharge Frequent or constant Supervision/Assistance  Patient can return home with the following Assist for transportation;Direct supervision/assist for medications management;A little help with walking and/or transfers;A little help with bathing/dressing/bathroom   Equipment Recommendations  None recommended by PT    Recommendations for Other Services       Precautions / Restrictions Precautions Precautions: Fall Restrictions Weight Bearing Restrictions: No     Mobility  Bed Mobility Overal bed mobility: Modified Independent             General bed mobility comments: Min extra time and effort only     Transfers Overall transfer level: Needs assistance Equipment used: Rolling walker (2 wheels)   Sit to Stand: From elevated surface, Min assist, Min guard           General transfer comment: Min verbal cues for hand placement and increased trunk flexion; min A to stand from standard height bed and CGA from elevated surface    Ambulation/Gait Ambulation/Gait assistance: Min guard Gait Distance (Feet): 50 Feet x 1, 15 Feet x 1 Assistive device: Rolling walker (2 wheels) Gait Pattern/deviations: Step-through pattern, Decreased step length - right, Decreased step length - left, Trunk flexed Gait velocity: decreased     General Gait Details: Slow cadence with mod flexed trunk position but steady without LOB, mod verbal cues for amb closer to the RW with upright posture   Stairs             Wheelchair Mobility    Modified Rankin (Stroke Patients Only)       Balance Overall balance assessment: Needs assistance Sitting-balance support: Feet supported Sitting balance-Leahy Scale: Good     Standing balance support: Bilateral upper extremity supported, During functional activity, Reliant on assistive device for balance Standing balance-Leahy Scale: Fair                              Cognition Arousal/Alertness: Awake/alert Behavior During Therapy: WFL for tasks assessed/performed Overall Cognitive Status: Within Functional Limits for tasks assessed  Exercises Total Joint Exercises Ankle Circles/Pumps: AROM, Strengthening, Both, 5 reps, 10 reps Hip ABduction/ADduction: Strengthening, Both, AAROM, AROM, 5 reps Straight Leg Raises: AROM, AAROM, Strengthening, Both, 5 reps Long Arc Quad: Strengthening, Both, 5 reps, 10 reps Knee Flexion: Strengthening, Both, 5 reps, 10 reps Marching in Standing: Strengthening, Both, 5 reps, Standing    General Comments        Pertinent Vitals/Pain Pain  Assessment Pain Assessment: No/denies pain    Home Living                          Prior Function            PT Goals (current goals can now be found in the care plan section) Progress towards PT goals: Progressing toward goals    Frequency    Min 2X/week      PT Plan Current plan remains appropriate    Co-evaluation              AM-PAC PT "6 Clicks" Mobility   Outcome Measure  Help needed turning from your back to your side while in a flat bed without using bedrails?: None Help needed moving from lying on your back to sitting on the side of a flat bed without using bedrails?: None Help needed moving to and from a bed to a chair (including a wheelchair)?: A Little Help needed standing up from a chair using your arms (e.g., wheelchair or bedside chair)?: A Little Help needed to walk in hospital room?: A Little Help needed climbing 3-5 steps with a railing? : A Little 6 Click Score: 20    End of Session Equipment Utilized During Treatment: Gait belt;Oxygen Activity Tolerance: Patient tolerated treatment well Patient left: in chair;with call bell/phone within reach;with chair alarm set;with family/visitor present Nurse Communication: Mobility status PT Visit Diagnosis: Other abnormalities of gait and mobility (R26.89);Muscle weakness (generalized) (M62.81)     Time: 1610-9604 PT Time Calculation (min) (ACUTE ONLY): 28 min  Charges:  $Gait Training: 8-22 mins $Therapeutic Exercise: 8-22 mins                     D. Scott Javan Gonzaga PT, DPT 05/18/22, 11:17 AM

## 2022-05-18 NOTE — Progress Notes (Addendum)
Palliative Care Progress Note, Assessment & Plan   Patient Name: Spencer Coleman       Date: 05/18/2022 DOB: 1930-02-11  Age: 89 y.o. MRN#: [id number] Attending Physician: Lurene Shadow, MD Primary Care Physician: Barbette Reichmann, MD Admit Date: 05/12/2022                                Subjective: Patient is at edge of bed with PT and mobility specialist.  Patient attempting to move from bed to recliner.  No family or friends present at bedside.  HPI: 89 y.o. male  with past medical history of PAF, Ventricular Tachycardia s/p BiV ICD (2009), HLD, HTN, dilated cardiomyopathy, chronic systolic heart failure and CKD admitted from home on 05/12/2022 with complaints of shortness of breath. EMS was called by sister as she was concerned about ongoing weakness and possible UTI being managed as outpatient.   On arrival to ED, found to be in wide-complex tachycardia (140's) with soft SBP (80-90's). BNP >3,000 and CXR concerning for vascular congestion. Started on amiodarone infusion, initially admitted to ICU due to concern for needing vasopressor support given hypotension-vasopressors were not initiated    Transitioned to PO amiodarone 5/3, tolerating well   Episodes of confusion, AMS, agitation throughout hospitalization, mentation seems to wax and wane   Palliative medicine was consulted for assisting with goals of care conversations.  Summary of counseling/coordination of care: After reviewing the patient's chart, I assessed the patient at bedside.  Symptoms assessed. I question patient's ability to fully comprehend his current medical situation.  Patient is in no apparent distress with no acute complaints.  No adjustments to medications needed at this time.  Given patient's memory issues, patient is unable to  participate in goals of care and medical decision making independently.  Patient has POA documents (see ACP tab in Epic) that names his daughter Spencer Coleman as his next of kin decision maker.   I attempted to speak with patient's POA/daughter Spencer Coleman over the phone.  No answer.  HIPAA compliant voicemail left with PMT contact info given.   PMT will continue to attempt goals of care discussions with patient's POA/daughter Spencer Coleman.  Addendum:  I was able to connect with patient's daughter Spencer Coleman over the phone this afternoon.  Medical update given.  We discussed advance care planning, CODE STATUS, and anticipatory care needs.  Full code versus DNR status discussed in detail.  Spencer Coleman recalls speaking with patient and my colleague Taylot in regards to code status. As patient's POA, Spencer Coleman shares she would like for patient's CODE STATUS to be converted to DNR with limited interventions/DNI.  In the event of the patient's heart and lung stopped, Spencer Coleman endorses she would not want patient to accept cardiopulmonary resuscitative efforts, defibrillation, or use of mechanical ventilatory support to sustain his life. She confirms her sister is also in agreement with this change.  CODE STATUS changed to DNR and medical team made aware.  We also discussed artificial feeding and hydration, though I highlighted that this is not an urgent boundary of care that needs to be decided today.  We reviewed pros and cons of artificial hydration and nutrition  as well as allowing the natural disease progression if patient is unable to keep up with caloric intake orally.  Spencer Coleman inquired about patient's care needs after discharge.  I discussed that PT and OT continue to evaluate patient and TOC is following closely for discharge planning.  Questions and concerns were addressed.  PMT contact information given.  PMT will continue to follow and support patient and family throughout his hospitalization.  Physical Exam Vitals reviewed.   Constitutional:      General: He is not in acute distress.    Appearance: He is normal weight.  HENT:     Head: Normocephalic.  Cardiovascular:     Rate and Rhythm: Normal rate.  Skin:    General: Skin is warm and dry.  Neurological:     Mental Status: He is alert.     Comments: Oriented to self and place  Psychiatric:        Mood and Affect: Mood is not anxious.        Behavior: Behavior normal. Behavior is not agitated.            50 minutes  Neah Sporrer L. Manon Hilding, FNP-BC Palliative Medicine Team Team Phone # [phone]

## 2022-05-18 NOTE — Progress Notes (Signed)
Progress Note    Spencer Coleman  WUJ:[id number] DOB: [birth year]  DOA: 05/12/2022 PCP: Barbette Reichmann, MD      Brief Narrative:    Medical records reviewed and are as summarized below:  Spencer Coleman is a 89 y.o. male PMH of Paroxysmal Atrial Fibrillation, Ventricular Tachycardia s/p BiV ICD (08/09/2007), HLD, HTN, Dilated Cardiomyopathy, Chronic Systolic CHF,  Chronic Kidney Disease, who presented to the hospital with shortness of breath.       Assessment/Plan:   Principal Problem:   Acute hypoxic respiratory failure (HCC) Active Problems:   Acute on chronic systolic CHF (congestive heart failure) (HCC)   CAD (coronary artery disease)   PAF (paroxysmal atrial fibrillation) (HCC)   Automatic implantable cardioverter-defibrillator in situ   CKD (chronic kidney disease) stage 3, GFR 30-59 ml/min (HCC)   Body mass index is 22.08 kg/m.    Acute on chronic systolic CHF, peripheral edema: S/p AICD: Continue IV Lasix and monitor BMP, daily weight and urine output. 2D echo showed EF estimated at 20 to 25%, grade 2 diastolic dysfunction, moderate LVH.   Acute hypoxic respiratory failure: He is on 2 L/min oxygen via [place]   Paroxysmal atrial fibrillation: Continue amiodarone, metoprolol and Eliquis   Acute UTI: Continue cefuroxime.   CKD stage IIIa: Creatinine is stable.   Memory impairment: Code stroke called on 05/16/2022 but CT head did not show any acute abnormality.  Altered mental status was attributed to Ativan at that time.   Thrombocytopenia: Platelet count was stable    Diet Order             Diet 2 gram sodium Fluid consistency: Thin  Diet effective now                            Consultants: Cardiologist Palliative care  Procedures: None    Medications:    amiodarone  400 mg Oral BID   Followed by   Melene Muller ON 05/28/2022] amiodarone  200 mg Oral Daily   apixaban  2.5 mg Oral BID   cefUROXime  250 mg Oral BID WC    furosemide  40 mg Intravenous BID   losartan  25 mg Oral Daily   metoprolol succinate  25 mg Oral Daily   QUEtiapine  12.5 mg Oral QHS   simvastatin  40 mg Oral q1800   Continuous Infusions:  sodium chloride       Anti-infectives (From admission, onward)    Start     Dose/Rate Route Frequency Ordered Stop   05/18/22 0800  cefUROXime (CEFTIN) tablet 250 mg        250 mg Oral 2 times daily with meals 05/17/22 1428 05/24/22 0759   05/17/22 1200  cefdinir (OMNICEF) capsule 300 mg  Status:  Discontinued        300 mg Oral Daily 05/17/22 1113 05/17/22 1428              Family Communication/Anticipated D/C date and plan/Code Status   DVT prophylaxis: apixaban (ELIQUIS) tablet 2.5 mg Start: 05/12/22 2200 SCDs Start: 05/12/22 1552 apixaban (ELIQUIS) tablet 2.5 mg     Code Status: DNR  Family Communication: None Disposition Plan: Plan to discharge home in 2 to 3 days   Status is: Inpatient Remains inpatient appropriate because: CHF exacerbation on IV Lasix       Subjective:   Interval events noted.  He does not report any shortness of breath or chest  pain.  Natalie, OT, was at the bedside.  Objective:    Vitals:   05/17/22 1948 05/18/22 0108 05/18/22 0400 05/18/22 0740  BP: 101/72 98/61 106/71 112/64  Pulse:  (!) 48 68 69  Resp: 17 18 16 19   Temp: 97.8 F (36.6 C) 98.2 F (36.8 C) 97.6 F (36.4 C) 97.6 F (36.4 C)  TempSrc: Axillary Axillary Axillary Axillary  SpO2: 100% 92% 100% 97%  Weight:   69.8 kg   Height:       No data found.   Intake/Output Summary (Last 24 hours) at 05/18/2022 1622 Last data filed at 05/18/2022 1548 Gross per 24 hour  Intake 420 ml  Output 2950 ml  Net -2530 ml   Filed Weights   05/16/22 0346 05/17/22 0348 05/18/22 0400  Weight: 72.6 kg 56.9 kg 69.8 kg    Exam:  GEN: NAD SKIN: No rash EYES: EOMI ENT: MMM CV: RRR PULM: Bibasilar rales, no wheezing ABD: soft, ND, NT, +BS CNS: AAO x 2 (person and place), non  focal EXT: Significant bilateral leg and pedal edema, no erythema or tenderness        Data Reviewed:   I have personally reviewed following labs and imaging studies:  Labs: Labs show the following:   Basic Metabolic Panel: Recent Labs  Lab 05/12/22 1715 05/13/22 0407 05/14/22 0411 05/15/22 0455 05/16/22 0419 05/17/22 0522 05/18/22 0457  NA 140 140 138 141 138 139 139  K 3.8 3.5 4.0 3.9 3.9 3.9 3.8  CL 103 105 103 104 103 105 102  CO2 25 26 25 29 24 26 30   GLUCOSE 135* 119* 99 110* 114* 89 116*  BUN 26* 26* 25* 28* 40* 37* 35*  CREATININE 1.73* 1.69* 1.46* 1.57* 1.55* 1.43* 1.49*  CALCIUM 8.9 8.6* 9.0 8.7* 9.1 8.8* 8.6*  MG 2.2 2.1 2.1  --   --   --  2.8*  PHOS 4.2 3.7  --   --   --   --   --    GFR Estimated Creatinine Clearance: 33.2 mL/min (A) (by C-G formula based on SCr of 1.49 mg/dL (H)). Liver Function Tests: Recent Labs  Lab 05/12/22 1715  AST 28  ALT 16  ALKPHOS 89  BILITOT 2.2*  PROT 5.7*  ALBUMIN 3.4*   No results for input(s): "LIPASE", "AMYLASE" in the last 168 hours. No results for input(s): "AMMONIA" in the last 168 hours. Coagulation profile No results for input(s): "INR", "PROTIME" in the last 168 hours.  CBC: Recent Labs  Lab 05/12/22 1338 05/13/22 0407 05/14/22 0411 05/15/22 0455 05/16/22 0419 05/17/22 0522 05/18/22 0457  WBC 5.9   < > 5.9 5.8 6.2 6.2 8.1  NEUTROABS 3.6  --   --   --   --   --   --   HGB 13.2   < > 13.5 13.8 13.6 12.9* 13.1  HCT 42.7   < > 42.4 43.0 42.8 42.0 41.2  MCV 96.0   < > 93.2 93.3 93.4 97.7 93.6  PLT 165   < > 130* 116* 142* 102* 121*   < > = values in this interval not displayed.   Cardiac Enzymes: No results for input(s): "CKTOTAL", "CKMB", "CKMBINDEX", "TROPONINI" in the last 168 hours. BNP (last 3 results) No results for input(s): "PROBNP" in the last 8760 hours. CBG: Recent Labs  Lab 05/12/22 1714 05/14/22 2353  GLUCAP 135* 111*   D-Dimer: No results for input(s): "DDIMER" in the last  72 hours. Hgb A1c: No  results for input(s): "HGBA1C" in the last 72 hours. Lipid Profile: No results for input(s): "CHOL", "HDL", "LDLCALC", "TRIG", "CHOLHDL", "LDLDIRECT" in the last 72 hours. Thyroid function studies: No results for input(s): "TSH", "T4TOTAL", "T3FREE", "THYROIDAB" in the last 72 hours.  Invalid input(s): "FREET3" Anemia work up: No results for input(s): "VITAMINB12", "FOLATE", "FERRITIN", "TIBC", "IRON", "RETICCTPCT" in the last 72 hours. Sepsis Labs: Recent Labs  Lab 05/12/22 1715 05/13/22 0407 05/14/22 0411 05/15/22 0455 05/16/22 0419 05/17/22 0522 05/18/22 0457  PROCALCITON <0.10  --   --   --   --   --   --   WBC  --  6.5   < > 5.8 6.2 6.2 8.1  LATICACIDVEN 2.9* 1.7  --   --   --   --   --    < > = values in this interval not displayed.    Microbiology Recent Results (from the past 240 hour(s))  SARS Coronavirus 2 by RT PCR (hospital order, performed in [facility] hospital lab) *cepheid single result test* Anterior Nasal Swab     Status: None   Collection Time: 05/12/22  3:54 PM   Specimen: Anterior Nasal Swab  Result Value Ref Range Status   SARS Coronavirus 2 by RT PCR NEGATIVE NEGATIVE Final    Comment: (NOTE) SARS-CoV-2 target nucleic acids are NOT DETECTED.  The SARS-CoV-2 RNA is generally detectable in upper and lower respiratory specimens during the acute phase of infection. The lowest concentration of SARS-CoV-2 viral copies this assay can detect is 250 copies / mL. A negative result does not preclude SARS-CoV-2 infection and should not be used as the sole basis for treatment or other patient management decisions.  A negative result may occur with improper specimen collection / handling, submission of specimen other than nasopharyngeal swab, presence of viral mutation(s) within the areas targeted by this assay, and inadequate number of viral copies (<250 copies / mL). A negative result must be combined with clinical observations, patient  history, and epidemiological information.  Fact Sheet for Patients:   RoadLapTop.co.za  Fact Sheet for Healthcare Providers: http://kim-miller.com/  This test is not yet approved or  cleared by the [place] FDA and has been authorized for detection and/or diagnosis of SARS-CoV-2 by FDA under an Emergency Use Authorization (EUA).  This EUA will remain in effect (meaning this test can be used) for the duration of the COVID-19 declaration under Section 564(b)(1) of the Act, 21 U.S.C. section 360bbb-3(b)(1), unless the authorization is terminated or revoked sooner.  Performed at [facility], [address] [place], [place] [address]   MRSA Next Gen by PCR, Nasal     Status: None   Collection Time: 05/12/22  5:12 PM   Specimen: Nasal Mucosa; Nasal Swab  Result Value Ref Range Status   MRSA by PCR Next Gen NOT DETECTED NOT DETECTED Final    Comment: (NOTE) The GeneXpert MRSA Assay (FDA approved for NASAL specimens only), is one component of a comprehensive MRSA colonization surveillance program. It is not intended to diagnose MRSA infection nor to guide or monitor treatment for MRSA infections. Test performance is not FDA approved in patients less than 45 years old. Performed at [facility], [address] [place], [place] [address]   Culture, blood (Routine X 2) w Reflex to ID Panel     Status: None   Collection Time: 05/12/22  5:33 PM   Specimen: BLOOD  Result Value Ref Range Status   Specimen Description BLOOD BLOOD  LEFT ARM  Final   Special Requests   Final    BOTTLES DRAWN AEROBIC AND ANAEROBIC Blood Culture adequate volume   Culture   Final    NO GROWTH 5 DAYS Performed at [facility], [address] [place], [place] [address]    Report Status 05/17/2022 FINAL  Final  Culture, blood (Routine X 2) w Reflex to ID Panel     Status: None   Collection Time: 05/12/22  5:33 PM   Specimen:  BLOOD  Result Value Ref Range Status   Specimen Description BLOOD BLOOD RIGHT HAND [facility]  Final   Special Requests   Final    BOTTLES DRAWN AEROBIC AND ANAEROBIC Blood Culture results may not be optimal due to an inadequate volume of blood received in culture bottles   Culture   Final    NO GROWTH 5 DAYS Performed at [facility], [address] [place], [place] [address]    Report Status 05/17/2022 FINAL  Final  Urine Culture     Status: Abnormal   Collection Time: 05/12/22  9:38 PM   Specimen: Urine, Clean Catch  Result Value Ref Range Status   Specimen Description   Final    URINE, CLEAN CATCH Performed at [facility], [address], [place], [place] [address]    Special Requests   Final    NONE Performed at [facility], [address] [place], [place] [address]    Culture (A)  Final    <10,000 COLONIES/mL INSIGNIFICANT GROWTH Performed at [facility], [address], [place], [place] [address]    Report Status 05/14/2022 FINAL  Final  Respiratory (~20 pathogens) panel by PCR     Status: None   Collection Time: 05/12/22  9:38 PM   Specimen: Nasopharyngeal Swab; Respiratory  Result Value Ref Range Status   Adenovirus NOT DETECTED NOT DETECTED Final   Coronavirus 229E NOT DETECTED NOT DETECTED Final    Comment: (NOTE) The Coronavirus on the Respiratory Panel, DOES NOT test for the novel  Coronavirus (2019 nCoV)    Coronavirus HKU1 NOT DETECTED NOT DETECTED Final   Coronavirus NL63 NOT DETECTED NOT DETECTED Final   Coronavirus OC43 NOT DETECTED NOT DETECTED Final   Metapneumovirus NOT DETECTED NOT DETECTED Final   Rhinovirus / Enterovirus NOT DETECTED NOT DETECTED Final   Influenza A NOT DETECTED NOT DETECTED Final   Influenza B NOT DETECTED NOT DETECTED Final   Parainfluenza Virus 1 NOT DETECTED NOT DETECTED Final   Parainfluenza Virus 2 NOT DETECTED NOT DETECTED Final   Parainfluenza Virus 3 NOT DETECTED NOT DETECTED Final    Parainfluenza Virus 4 NOT DETECTED NOT DETECTED Final   Respiratory Syncytial Virus NOT DETECTED NOT DETECTED Final   Bordetella pertussis NOT DETECTED NOT DETECTED Final   Bordetella Parapertussis NOT DETECTED NOT DETECTED Final   Chlamydophila pneumoniae NOT DETECTED NOT DETECTED Final   Mycoplasma pneumoniae NOT DETECTED NOT DETECTED Final    Comment: Performed at [facility], [address] [place], [place] [address]    Procedures and diagnostic studies:  No results found.             LOS: 6 days   Dorris Pierre  [facility]   Pager on [email or web address]. If 7PM-7AM, please contact night-coverage at [email or web address]     05/18/2022, 4:22 PM

## 2022-05-19 DIAGNOSIS — I25119 Atherosclerotic heart disease of native coronary artery with unspecified angina pectoris: Secondary | ICD-10-CM

## 2022-05-19 DIAGNOSIS — J9601 Acute respiratory failure with hypoxia: Secondary | ICD-10-CM | POA: Diagnosis not present

## 2022-05-19 DIAGNOSIS — I5023 Acute on chronic systolic (congestive) heart failure: Secondary | ICD-10-CM | POA: Diagnosis not present

## 2022-05-19 DIAGNOSIS — I509 Heart failure, unspecified: Secondary | ICD-10-CM | POA: Diagnosis not present

## 2022-05-19 DIAGNOSIS — R000 Tachycardia, unspecified: Secondary | ICD-10-CM | POA: Diagnosis not present

## 2022-05-19 LAB — CBC
HCT: 42 % (ref 39.0–52.0)
Hemoglobin: 13.4 g/dL (ref 13.0–17.0)
MCH: 29.6 pg (ref 26.0–34.0)
MCHC: 31.9 g/dL (ref 30.0–36.0)
MCV: 92.7 fL (ref 80.0–100.0)
Platelets: 105 10*3/uL — ABNORMAL LOW (ref 150–400)
RBC: 4.53 MIL/uL (ref 4.22–5.81)
RDW: 15.4 % (ref 11.5–15.5)
WBC: 6.5 10*3/uL (ref 4.0–10.5)
nRBC: 0 % (ref 0.0–0.2)

## 2022-05-19 LAB — BASIC METABOLIC PANEL
Anion gap: 8 (ref 5–15)
BUN: 38 mg/dL — ABNORMAL HIGH (ref 8–23)
CO2: 30 mmol/L (ref 22–32)
Calcium: 8.9 mg/dL (ref 8.9–10.3)
Chloride: 100 mmol/L (ref 98–111)
Creatinine, Ser: 1.39 mg/dL — ABNORMAL HIGH (ref 0.61–1.24)
GFR, Estimated: 48 mL/min — ABNORMAL LOW (ref >60)
Glucose, Bld: 112 mg/dL — ABNORMAL HIGH (ref 70–99)
Potassium: 3.9 mmol/L (ref 3.5–5.1)
Sodium: 138 mmol/L (ref 135–145)

## 2022-05-19 LAB — MAGNESIUM: Magnesium: 2.4 mg/dL (ref 1.7–2.4)

## 2022-05-19 MED ORDER — POTASSIUM CHLORIDE 20 MEQ PO PACK
40.0000 meq | PACK | Freq: Once | ORAL | Status: AC
  Administered 2022-05-19: 40 meq via ORAL
  Filled 2022-05-19: qty 2

## 2022-05-19 MED ORDER — METOPROLOL SUCCINATE ER 25 MG PO TB24
12.5000 mg | ORAL_TABLET | Freq: Every day | ORAL | Status: DC
  Administered 2022-05-20 – 2022-05-31 (×9): 12.5 mg via ORAL
  Filled 2022-05-19 (×10): qty 1

## 2022-05-19 MED ORDER — LOSARTAN POTASSIUM 25 MG PO TABS: 12.5000 mg | ORAL_TABLET | Freq: Every day | ORAL | Status: DC

## 2022-05-19 NOTE — Progress Notes (Signed)
Oxygen saturation:  Room air at rest = 98%  Room air while ambulating = 96%

## 2022-05-19 NOTE — Progress Notes (Addendum)
Progress Note    Spencer Coleman  ZOX:[id number] DOB: 31-Mar-1930  DOA: 05/12/2022 PCP: Barbette Reichmann, MD      Brief Narrative:    Medical records reviewed and are as summarized below:  Spencer Coleman is a 89 y.o. male PMH of Paroxysmal Atrial Fibrillation, Ventricular Tachycardia s/p BiV ICD (08/09/2007), HLD, HTN, Dilated Cardiomyopathy, Chronic Systolic CHF,  Chronic Kidney Disease, who presented to the hospital with shortness of breath.       Assessment/Plan:   Principal Problem:   Acute hypoxic respiratory failure (HCC) Active Problems:   Acute on chronic systolic CHF (congestive heart failure) (HCC)   CAD (coronary artery disease)   PAF (paroxysmal atrial fibrillation) (HCC)   Automatic implantable cardioverter-defibrillator in situ   CKD (chronic kidney disease) stage 3, GFR 30-59 ml/min (HCC)   Body mass index is 21.21 kg/m.    Acute on chronic systolic CHF, peripheral edema: S/p AICD: IV Lasix has been discontinued by cardiologist.  Plan to start oral diuretics tomorrow.  2D echo showed EF estimated at 20 to 25%, grade 2 diastolic dysfunction, moderate LVH.  BNP was 3,155.   Acute hypoxic respiratory failure: Oxygen saturation 93% on room air but oxygen saturation dropped to 88% with little activity in his room.  Check pulse oximetry with ambulation.   Paroxysmal atrial fibrillation: Continue amiodarone, metoprolol and Eliquis   Hypotension: Losartan has been decreased from 25 mg to 12.5 mg daily and metoprolol XL has been decreased from 25 to 12.5 mg daily.  Monitor BP closely   Acute UTI: No fever or leukocytosis.  Treat for total of 3 days.  Will discontinue antibiotics after today..  Urine culture did not show any significant growth.   CKD stage IIIa: Creatinine is stable.   Memory impairment: Code stroke called on 05/16/2022 but CT head did not show any acute abnormality.  Altered mental status was attributed to Ativan at that  time.   Thrombocytopenia: Fluctuating but stable platelet count.  Plan was discussed with his sister and [name] (son) at the bedside.  I called [name], daughter and healthcare power of attorney, but there was no response.    Diet Order             Diet 2 gram sodium Fluid consistency: Thin  Diet effective now                            Consultants: Cardiologist Palliative care  Procedures: None    Medications:    amiodarone  400 mg Oral BID   Followed by   Melene Muller ON 05/28/2022] amiodarone  200 mg Oral Daily   apixaban  2.5 mg Oral BID   cefUROXime  250 mg Oral BID WC   [START ON 05/20/2022] losartan  12.5 mg Oral Daily   [START ON 05/20/2022] metoprolol succinate  12.5 mg Oral Daily   QUEtiapine  12.5 mg Oral QHS   simvastatin  40 mg Oral q1800   Continuous Infusions:  sodium chloride       Anti-infectives (From admission, onward)    Start     Dose/Rate Route Frequency Ordered Stop   05/18/22 0800  cefUROXime (CEFTIN) tablet 250 mg        250 mg Oral 2 times daily with meals 05/17/22 1428 05/24/22 0759   05/17/22 1200  cefdinir (OMNICEF) capsule 300 mg  Status:  Discontinued        300 mg Oral  Daily 05/17/22 1113 05/17/22 1428              Family Communication/Anticipated D/C date and plan/Code Status   DVT prophylaxis: apixaban (ELIQUIS) tablet 2.5 mg Start: 05/12/22 2200 SCDs Start: 05/12/22 1552 apixaban (ELIQUIS) tablet 2.5 mg     Code Status: DNR  Family Communication: Plan discussed with the patient's sister and [name], his son, at the bedside. Disposition Plan: Plan to discharge home in 2 to 3 days   Status is: Inpatient Remains inpatient appropriate because: CHF exacerbation on IV Lasix       Subjective:   Interval events noted.  He has no complaints.  He feels better.  No chest pain or shortness of breath.  He was up on his feet with his walker.  CNA was at the bedside.  Objective:    Vitals:   05/19/22 1110  05/19/22 1111 05/19/22 1117 05/19/22 1148  BP:   (!) 87/53 (!) 87/53  Pulse: 70 70 69 69  Resp: (!) 24 18 18 17   Temp:    98.3 F (36.8 C)  TempSrc:    Oral  SpO2: 97% 96% 95% 95%  Weight:      Height:       No data found.   Intake/Output Summary (Last 24 hours) at 05/19/2022 1510 Last data filed at 05/19/2022 1337 Gross per 24 hour  Intake 1020 ml  Output 1350 ml  Net -330 ml   Filed Weights   05/18/22 0400 05/19/22 0653 05/19/22 0752  Weight: 69.8 kg 67 kg 67 kg    Exam:  GEN: NAD SKIN: No rash EYES: EOMI ENT: MMM CV: RRR PULM: CTA B ABD: soft, ND, NT, +BS CNS: AAO x 2 (person and place), non focal EXT: Bilateral distal leg and pedal edema.  No erythema or tenderness      Data Reviewed:   I have personally reviewed following labs and imaging studies:  Labs: Labs show the following:   Basic Metabolic Panel: Recent Labs  Lab 05/12/22 1715 05/13/22 0407 05/14/22 0411 05/15/22 0455 05/16/22 0419 05/17/22 0522 05/18/22 0457 05/19/22 0357  NA 140 140 138 141 138 139 139 138  K 3.8 3.5 4.0 3.9 3.9 3.9 3.8 3.9  CL 103 105 103 104 103 105 102 100  CO2 25 26 25 29 24 26 30 30   GLUCOSE 135* 119* 99 110* 114* 89 116* 112*  BUN 26* 26* 25* 28* 40* 37* 35* 38*  CREATININE 1.73* 1.69* 1.46* 1.57* 1.55* 1.43* 1.49* 1.39*  CALCIUM 8.9 8.6* 9.0 8.7* 9.1 8.8* 8.6* 8.9  MG 2.2 2.1 2.1  --   --   --  2.8* 2.4  PHOS 4.2 3.7  --   --   --   --   --   --    GFR Estimated Creatinine Clearance: 34.1 mL/min (A) (by C-G formula based on SCr of 1.39 mg/dL (H)). Liver Function Tests: Recent Labs  Lab 05/12/22 1715  AST 28  ALT 16  ALKPHOS 89  BILITOT 2.2*  PROT 5.7*  ALBUMIN 3.4*   No results for input(s): "LIPASE", "AMYLASE" in the last 168 hours. No results for input(s): "AMMONIA" in the last 168 hours. Coagulation profile No results for input(s): "INR", "PROTIME" in the last 168 hours.  CBC: Recent Labs  Lab 05/15/22 0455 05/16/22 0419 05/17/22 0522  05/18/22 0457 05/19/22 0357  WBC 5.8 6.2 6.2 8.1 6.5  HGB 13.8 13.6 12.9* 13.1 13.4  HCT 43.0 42.8 42.0 41.2 42.0  MCV 93.3 93.4 97.7 93.6 92.7  PLT 116* 142* 102* 121* 105*   Cardiac Enzymes: No results for input(s): "CKTOTAL", "CKMB", "CKMBINDEX", "TROPONINI" in the last 168 hours. BNP (last 3 results) No results for input(s): "PROBNP" in the last 8760 hours. CBG: Recent Labs  Lab 05/12/22 1714 05/14/22 2353  GLUCAP 135* 111*   D-Dimer: No results for input(s): "DDIMER" in the last 72 hours. Hgb A1c: No results for input(s): "HGBA1C" in the last 72 hours. Lipid Profile: No results for input(s): "CHOL", "HDL", "LDLCALC", "TRIG", "CHOLHDL", "LDLDIRECT" in the last 72 hours. Thyroid function studies: No results for input(s): "TSH", "T4TOTAL", "T3FREE", "THYROIDAB" in the last 72 hours.  Invalid input(s): "FREET3" Anemia work up: No results for input(s): "VITAMINB12", "FOLATE", "FERRITIN", "TIBC", "IRON", "RETICCTPCT" in the last 72 hours. Sepsis Labs: Recent Labs  Lab 05/12/22 1715 05/13/22 0407 05/14/22 0411 05/16/22 0419 05/17/22 0522 05/18/22 0457 05/19/22 0357  PROCALCITON <0.10  --   --   --   --   --   --   WBC  --  6.5   < > 6.2 6.2 8.1 6.5  LATICACIDVEN 2.9* 1.7  --   --   --   --   --    < > = values in this interval not displayed.    Microbiology Recent Results (from the past 240 hour(s))  SARS Coronavirus 2 by RT PCR (hospital order, performed in [facility] hospital lab) *cepheid single result test* Anterior Nasal Swab     Status: None   Collection Time: 05/12/22  3:54 PM   Specimen: Anterior Nasal Swab  Result Value Ref Range Status   SARS Coronavirus 2 by RT PCR NEGATIVE NEGATIVE Final    Comment: (NOTE) SARS-CoV-2 target nucleic acids are NOT DETECTED.  The SARS-CoV-2 RNA is generally detectable in upper and lower respiratory specimens during the acute phase of infection. The lowest concentration of SARS-CoV-2 viral copies this assay can  detect is 250 copies / mL. A negative result does not preclude SARS-CoV-2 infection and should not be used as the sole basis for treatment or other patient management decisions.  A negative result may occur with improper specimen collection / handling, submission of specimen other than nasopharyngeal swab, presence of viral mutation(s) within the areas targeted by this assay, and inadequate number of viral copies (<250 copies / mL). A negative result must be combined with clinical observations, patient history, and epidemiological information.  Fact Sheet for Patients:   RoadLapTop.co.za  Fact Sheet for Healthcare Providers: http://kim-miller.com/  This test is not yet approved or  cleared by the [place] FDA and has been authorized for detection and/or diagnosis of SARS-CoV-2 by FDA under an Emergency Use Authorization (EUA).  This EUA will remain in effect (meaning this test can be used) for the duration of the COVID-19 declaration under Section 564(b)(1) of the Act, 21 U.S.C. section 360bbb-3(b)(1), unless the authorization is terminated or revoked sooner.  Performed at [facility], [address] [place], [place] [address]   MRSA Next Gen by PCR, Nasal     Status: None   Collection Time: 05/12/22  5:12 PM   Specimen: Nasal Mucosa; Nasal Swab  Result Value Ref Range Status   MRSA by PCR Next Gen NOT DETECTED NOT DETECTED Final    Comment: (NOTE) The GeneXpert MRSA Assay (FDA approved for NASAL specimens only), is one component of a comprehensive MRSA colonization surveillance program. It is not intended to diagnose MRSA infection nor to guide  or monitor treatment for MRSA infections. Test performance is not FDA approved in patients less than 80 years old. Performed at [facility], [address] [place], [place] [address]   Culture, blood (Routine X 2) w Reflex to ID Panel     Status: None    Collection Time: 05/12/22  5:33 PM   Specimen: BLOOD  Result Value Ref Range Status   Specimen Description BLOOD BLOOD LEFT ARM  Final   Special Requests   Final    BOTTLES DRAWN AEROBIC AND ANAEROBIC Blood Culture adequate volume   Culture   Final    NO GROWTH 5 DAYS Performed at [facility], [address] [place], [place] [address]    Report Status 05/17/2022 FINAL  Final  Culture, blood (Routine X 2) w Reflex to ID Panel     Status: None   Collection Time: 05/12/22  5:33 PM   Specimen: BLOOD  Result Value Ref Range Status   Specimen Description BLOOD BLOOD RIGHT HAND [facility]  Final   Special Requests   Final    BOTTLES DRAWN AEROBIC AND ANAEROBIC Blood Culture results may not be optimal due to an inadequate volume of blood received in culture bottles   Culture   Final    NO GROWTH 5 DAYS Performed at [facility], [address], [place], [place] [address]    Report Status 05/17/2022 FINAL  Final  Urine Culture     Status: Abnormal   Collection Time: 05/12/22  9:38 PM   Specimen: Urine, Clean Catch  Result Value Ref Range Status   Specimen Description   Final    URINE, CLEAN CATCH Performed at [facility], [address] [place], [place] [address]    Special Requests   Final    NONE Performed at [facility], [address] [place], [place] [address]    Culture (A)  Final    <10,000 COLONIES/mL INSIGNIFICANT GROWTH Performed at [facility], [address] [place], [place] [address]    Report Status 05/14/2022 FINAL  Final  Respiratory (~20 pathogens) panel by PCR     Status: None   Collection Time: 05/12/22  9:38 PM   Specimen: Nasopharyngeal Swab; Respiratory  Result Value Ref Range Status   Adenovirus NOT DETECTED NOT DETECTED Final   Coronavirus 229E NOT DETECTED NOT DETECTED Final    Comment: (NOTE) The Coronavirus on the Respiratory Panel, DOES NOT test for the novel  Coronavirus (2019 nCoV)    Coronavirus  HKU1 NOT DETECTED NOT DETECTED Final   Coronavirus NL63 NOT DETECTED NOT DETECTED Final   Coronavirus OC43 NOT DETECTED NOT DETECTED Final   Metapneumovirus NOT DETECTED NOT DETECTED Final   Rhinovirus / Enterovirus NOT DETECTED NOT DETECTED Final   Influenza A NOT DETECTED NOT DETECTED Final   Influenza B NOT DETECTED NOT DETECTED Final   Parainfluenza Virus 1 NOT DETECTED NOT DETECTED Final   Parainfluenza Virus 2 NOT DETECTED NOT DETECTED Final   Parainfluenza Virus 3 NOT DETECTED NOT DETECTED Final   Parainfluenza Virus 4 NOT DETECTED NOT DETECTED Final   Respiratory Syncytial Virus NOT DETECTED NOT DETECTED Final   Bordetella pertussis NOT DETECTED NOT DETECTED Final   Bordetella Parapertussis NOT DETECTED NOT DETECTED Final   Chlamydophila pneumoniae NOT DETECTED NOT DETECTED Final   Mycoplasma pneumoniae NOT DETECTED NOT DETECTED Final    Comment: Performed at [facility], [address], [place], [place] [address]  Procedures and diagnostic studies:  No results found.             LOS: 7 days   Chele Cornell  [facility]   Pager on [email or web address]. If 7PM-7AM, please contact night-coverage at [email or web address]     05/19/2022, 3:10 PM

## 2022-05-19 NOTE — TOC Progression Note (Signed)
Transition of Care [facility]) - Progression Note    Patient Details  Name: Spencer Coleman MRN: [id number] Date of Birth: Apr 20, 1930  [facility] CM/SW Contact  Truddie Hidden, RN Phone Number: 05/19/2022, 1:32 AM  Clinical Narrative:    Referral made to Candie Chroman at [facility] for assistance with ALF. Patient daughter no longer interested in ALF. [facility]. Cammy Copa will follow up with family later this week.         Expected Discharge Plan and Services                                               Social Determinants of Health (SDOH) Interventions SDOH Screenings   Food Insecurity: No Food Insecurity (05/16/2022)  Housing: Low Risk  (05/16/2022)  Transportation Needs: No Transportation Needs (05/16/2022)  Utilities: Not At Risk (05/16/2022)  Tobacco Use: Low Risk  (05/18/2022)    Readmission Risk Interventions     No data to display

## 2022-05-19 NOTE — Progress Notes (Signed)
[facility] CLINIC CARDIOLOGY CONSULT NOTE       Patient ID: Spencer Coleman MRN: [id number] DOB/AGE: [birth year] 87 y.o.  Admit date: 05/12/2022 Referring Physician Dr. Artis Delay Primary Physician Dr. Marcello Fennel Primary Cardiologist Dr. Darrold Junker Reason for Consultation wide complex tachycardia   HPI: Spencer Coleman is an 89yoM with a PMH of chronic HFrEF / NICM, hx VT s/p BiV ICD, paroxysmal AF, CKD 3, HTN, HLD who presented to [facility] ED 05/12/2022 with shortness of breath. Tele strips in the ED noted a wide complex tachycardia that did not terminate with IV adenosine, most c/w VT. BNP elevated to 3000 on admission and CXR with vascular congestion c/w acute on chronic HFrEF.   Interval history:  -Clinically improved overall today -Denies chest pain or shortness of breath, lightheadedness or dizziness -No sustained VT or significant ectopy on telemetry today -Also weaned to room air  Review of systems complete and found to be negative unless listed above     Past Medical History:  Diagnosis Date   CAD (coronary artery disease)    Nonobstructive cardiac disease by cath   CHF (congestive heart failure) (HCC)    nonischemic cardiomyopathy   CKD (chronic kidney disease)    History of kidney stones    Hyperlipidemia    Hypertension    Inguinal hernia    PAF (paroxysmal atrial fibrillation) (HCC)    Patella fracture 01/09/2015   Renal colic 10/07/2011   Ventricular tachycardia [facility])    s/p ICD    Past Surgical History:  Procedure Laterality Date   CARDIAC CATHETERIZATION     CARDIAC DEFIBRILLATOR PLACEMENT  2009   CYSTOSCOPY WITH LITHOLAPAXY N/A 02/21/2018   Procedure: CYSTOSCOPY WITH LITHOLAPAXY;  Surgeon: Sondra Come, MD;  Location: [facility] ORS;  Service: Urology;  Laterality: N/A;   ELBOW SURGERY Left    HOLMIUM LASER APPLICATION N/A 02/21/2018   Procedure: HOLMIUM LASER APPLICATION;  Surgeon: Sondra Come, MD;  Location: [facility] ORS;  Service: Urology;  Laterality: N/A;   ICD  GENERATOR CHANGEOUT N/A 01/28/2021   Procedure: ICD GENERATOR CHANGEOUT;  Surgeon: Marcina Millard, MD;  Location: [facility] INVASIVE CV LAB;  Service: Cardiovascular;  Laterality: N/A;   INGUINAL HERNIA REPAIR     LITHOTRIPSY     ORIF left wrist fracture Left    ORIF PATELLA Left 01/10/2015   Procedure: OPEN REDUCTION INTERNAL (ORIF) FIXATION PATELLA;  Surgeon: Donato Heinz, MD;  Location: [facility] ORS;  Service: Orthopedics;  Laterality: Left;   ORIF PATELLA Left 02/06/2015   Procedure: OPEN REDUCTION INTERNAL (ORIF) FIXATION PATELLA;  Surgeon: Donato Heinz, MD;  Location: [facility] ORS;  Service: Orthopedics;  Laterality: Left;   TRANSURETHRAL RESECTION OF PROSTATE N/A 02/21/2018   Procedure: TRANSURETHRAL RESECTION OF THE PROSTATE (TURP);  Surgeon: Sondra Come, MD;  Location: [facility] ORS;  Service: Urology;  Laterality: N/A;    Medications Prior to Admission  Medication Sig Dispense Refill Last Dose   acetaminophen (TYLENOL) 500 MG tablet Take 2 tablets (1,000 mg total) by mouth every 6 (six) hours. 30 tablet 0 unknown   amiodarone (PACERONE) 400 MG tablet Take 400 mg by mouth daily.   05/11/2022   apixaban (ELIQUIS) 2.5 MG TABS tablet Take 1 tablet (2.5 mg total) by mouth 2 (two) times daily. 60 tablet 1 05/11/2022   furosemide (LASIX) 20 MG tablet Take 20 mg by mouth daily.   05/11/2022   HYDROcodone-acetaminophen (NORCO/VICODIN) 5-325 MG tablet Take 1 tablet by mouth every 6 (six) hours as needed  for moderate pain.   unknown   metoprolol succinate (TOPROL-XL) 25 MG 24 hr tablet Take 25 mg by mouth daily.   05/11/2022   simvastatin (ZOCOR) 40 MG tablet Take 40 mg by mouth at bedtime.   05/11/2022   Vitamin D, Ergocalciferol, (DRISDOL) 1.25 MG (50000 UNIT) CAPS capsule Take 50,000 Units by mouth once a week.   Past Week   Social History   Socioeconomic History   Marital status: Single    Spouse name: Not on file   Number of children: Not on file   Years of education: Not on file   Highest  education level: Not on file  Occupational History   Occupation: retired  Tobacco Use   Smoking status: Never   Smokeless tobacco: Never  Vaping Use   Vaping Use: Never used  Substance and Sexual Activity   Alcohol use: No    Alcohol/week: 0.0 standard drinks of alcohol   Drug use: No   Sexual activity: Not on file  Other Topics Concern   Not on file  Social History Narrative   Lives at home. Independent, no assisted devices at baseline   Social Determinants of Health   Financial Resource Strain: Not on file  Food Insecurity: No Food Insecurity (05/16/2022)   Hunger Vital Sign    Worried About Running Out of Food in the Last Year: Never true    Ran Out of Food in the Last Year: Never true  Transportation Needs: No Transportation Needs (05/16/2022)   PRAPARE - Administrator, Civil Service (Medical): No    Lack of Transportation (Non-Medical): No  Physical Activity: Not on file  Stress: Not on file  Social Connections: Not on file  Intimate Partner Violence: Not At Risk (05/16/2022)   Humiliation, Afraid, Rape, and Kick questionnaire    Fear of Current or Ex-Partner: No    Emotionally Abused: No    Physically Abused: No    Sexually Abused: No    Family History  Problem Relation Age of Onset   CAD Mother    Kidney cancer Father    Prostate cancer Neg Hx    Bladder Cancer Neg Hx       Intake/Output Summary (Last 24 hours) at 05/19/2022 1133 Last data filed at 05/19/2022 0845 Gross per 24 hour  Intake 540 ml  Output 1000 ml  Net -460 ml     Vitals:   05/19/22 1109 05/19/22 1110 05/19/22 1111 05/19/22 1117  BP:    (!) 87/53  Pulse: 69 70 70 69  Resp: (!) 22 (!) 24 18 18   Temp:      TempSrc:      SpO2: 98% 97% 96% 95%  Weight:      Height:        PHYSICAL EXAM General: elderly and frail caucasian male, in no acute distress.  Laying in ankle in bed with sister and son at bedside  HEENT:  Normocephalic and atraumatic. Neck:  No JVD.  Lungs: Normal  respiratory effort on room air.  Decreased breath sounds without appreciable crackles or wheezes.   Heart: HRRR. Normal S1 and S2 without gallops or murmurs.  Abdomen: Non-distended appearing.  Msk: Normal strength and tone for age. Extremities: Warm and well perfused. No clubbing, cyanosis. 1-2+ R and no left lower extremity edema.  Neuro: Alert and oriented to self and hospital  Psych:  Pleasant affect.   Labs: Basic Metabolic Panel: Recent Labs    05/18/22 0457 05/19/22 0357  NA 139 138  K 3.8 3.9  CL 102 100  CO2 30 30  GLUCOSE 116* 112*  BUN 35* 38*  CREATININE 1.49* 1.39*  CALCIUM 8.6* 8.9  MG 2.8* 2.4    Liver Function Tests: No results for input(s): "AST", "ALT", "ALKPHOS", "BILITOT", "PROT", "ALBUMIN" in the last 72 hours. No results for input(s): "LIPASE", "AMYLASE" in the last 72 hours. CBC: Recent Labs    05/18/22 0457 05/19/22 0357  WBC 8.1 6.5  HGB 13.1 13.4  HCT 41.2 42.0  MCV 93.6 92.7  PLT 121* 105*    Cardiac Enzymes: No results for input(s): "CKTOTAL", "CKMB", "CKMBINDEX", "TROPONINIHS" in the last 72 hours. BNP: No results for input(s): "BNP" in the last 72 hours. D-Dimer: No results for input(s): "DDIMER" in the last 72 hours. Hemoglobin A1C: No results for input(s): "HGBA1C" in the last 72 hours. Fasting Lipid Panel: No results for input(s): "CHOL", "HDL", "LDLCALC", "TRIG", "CHOLHDL", "LDLDIRECT" in the last 72 hours. Thyroid Function Tests: No results for input(s): "TSH", "T4TOTAL", "T3FREE", "THYROIDAB" in the last 72 hours.  Invalid input(s): "FREET3" Anemia Panel: No results for input(s): "VITAMINB12", "FOLATE", "FERRITIN", "TIBC", "IRON", "RETICCTPCT" in the last 72 hours.   Radiology: CT HEAD CODE STROKE WO CONTRAST`  Result Date: 05/16/2022 CLINICAL DATA:  Code stroke.  89 year old male EXAM: CT HEAD WITHOUT CONTRAST TECHNIQUE: Contiguous axial images were obtained from the base of the skull through the vertex without intravenous  contrast. RADIATION DOSE REDUCTION: This exam was performed according to the departmental dose-optimization program which includes automated exposure control, adjustment of the mA and/or kV according to patient size and/or use of iterative reconstruction technique. COMPARISON:  Head CT 06/12/2021. FINDINGS: Brain: Stable cerebral volume from last year. No midline shift, mass effect, or evidence of intracranial mass lesion. No acute intracranial hemorrhage identified. No ventriculomegaly. No cortically based acute infarct identified. Gray-white differentiation does not appear significantly changed from last year, mild for age small vessel disease changes. Vascular: Extensive Calcified atherosclerosis at the skull base. No suspicious intracranial vascular hyperdensity. Skull: No acute osseous abnormality identified. Sinuses/Orbits: Visualized paranasal sinuses and mastoids are stable and well aerated. Other: Visualized orbits and scalp soft tissues are within normal limits. ASPECTS [facility] Stroke Program Early CT Score) Total score (0-10 with 10 being normal): 10 IMPRESSION: 1. No acute cortically based infarct or acute intracranial hemorrhage identified. ASPECTS 10. 2. Mild for age small vessel disease changes. Electronically Signed   By: Odessa Fleming M.D.   On: 05/16/2022 10:24   ECHOCARDIOGRAM COMPLETE  Result Date: 05/13/2022    ECHOCARDIOGRAM REPORT   Patient Name:   Spencer Coleman Date of Exam: 05/13/2022 Medical Rec #:  [id number]    Height:       70.0 in Accession #:    [id number]   Weight:       153.7 lb Date of Birth:  May 08, 1930     BSA:          1.866 m Patient Age:    89 years     BP:           89/71 mmHg Patient Gender: M            HR:           75 bpm. Exam Location:  [facility] Procedure: 2D Echo, Cardiac Doppler, Color Doppler and Strain Analysis Indications:     Abnormal ECG  History:         Patient has prior history of Echocardiogram examinations, most  recent 06/13/2021. CHF, CAD, Abnormal ECG  and Defibrillator,                  Arrythmias:Atrial Fibrillation and Tachycardia; Risk                  Factors:Dyslipidemia and Hypertension.  Sonographer:     Mikki Harbor Referring Phys:  [id number] DANA G NELSON Diagnosing Phys: Adrian Blackwater IMPRESSIONS  1. Left ventricular ejection fraction, by estimation, is 20 to 25%. The left ventricle has severely decreased function. The left ventricle demonstrates global hypokinesis. The left ventricular internal cavity size was severely dilated. There is moderate  left ventricular hypertrophy. Left ventricular diastolic parameters are consistent with Grade II diastolic dysfunction (pseudonormalization).  2. Right ventricular systolic function is moderately reduced. The right ventricular size is moderately enlarged. Mildly increased right ventricular wall thickness. There is normal pulmonary artery systolic pressure.  3. Left atrial size was severely dilated.  4. Right atrial size was severely dilated.  5. The mitral valve is myxomatous. Severe mitral valve regurgitation.  6. Tricuspid valve regurgitation is mild to moderate.  7. The aortic valve is calcified. Aortic valve regurgitation is moderate.  8. Pulmonic valve regurgitation is moderate.  9. Aortic dilatation noted. Conclusion(s)/Recommendation(s): Findings consistent with dilated cardiomyopathy. FINDINGS  Left Ventricle: Left ventricular ejection fraction, by estimation, is 20 to 25%. The left ventricle has severely decreased function. The left ventricle demonstrates global hypokinesis. The left ventricular internal cavity size was severely dilated. There is moderate left ventricular hypertrophy. Left ventricular diastolic parameters are consistent with Grade II diastolic dysfunction (pseudonormalization). Right Ventricle: The right ventricular size is moderately enlarged. Mildly increased right ventricular wall thickness. Right ventricular systolic function is moderately reduced. There is normal pulmonary  artery systolic pressure. The tricuspid regurgitant velocity is 2.61 m/s, and with an assumed right atrial pressure of 8 mmHg, the estimated right ventricular systolic pressure is 35.2 mmHg. Left Atrium: Left atrial size was severely dilated. Right Atrium: Right atrial size was severely dilated. Pericardium: There is no evidence of pericardial effusion. Mitral Valve: The mitral valve is myxomatous. Severe mitral valve regurgitation. MV peak gradient, 2.5 mmHg. The mean mitral valve gradient is 1.0 mmHg. Tricuspid Valve: The tricuspid valve is grossly normal. Tricuspid valve regurgitation is mild to moderate. Aortic Valve: The aortic valve is calcified. Aortic valve regurgitation is moderate. Aortic regurgitation PHT measures 414 msec. Aortic valve mean gradient measures 6.5 mmHg. Aortic valve peak gradient measures 12.7 mmHg. Aortic valve area, by VTI measures 1.91 cm. Pulmonic Valve: The pulmonic valve was grossly normal. Pulmonic valve regurgitation is moderate. Aorta: Aortic dilatation noted. IAS/Shunts: No atrial level shunt detected by color flow Doppler.  LEFT VENTRICLE PLAX 2D LVIDd:         7.00 cm      Diastology LVIDs:         6.50 cm      LV e' medial:    3.16 cm/s LV PW:         0.80 cm      LV E/e' medial:  24.9 LV IVS:        0.90 cm      LV e' lateral:   4.70 cm/s LVOT diam:     2.00 cm      LV E/e' lateral: 16.7 LV SV:         68 LV SV Index:   37 LVOT Area:     3.14 cm  LV Volumes (MOD) LV vol d, MOD  A2C: 222.0 ml LV vol d, MOD A4C: 177.0 ml LV vol s, MOD A2C: 165.0 ml LV vol s, MOD A4C: 137.0 ml LV SV MOD A2C:     57.0 ml LV SV MOD A4C:     177.0 ml LV SV MOD BP:      46.7 ml RIGHT VENTRICLE RV Basal diam:  3.65 cm RV Mid diam:    3.30 cm RV S prime:     10.20 cm/s TAPSE (M-mode): 1.3 cm LEFT ATRIUM              Index        RIGHT ATRIUM           Index LA diam:        5.40 cm  2.89 cm/m   RA Area:     15.60 cm LA Vol (A2C):   93.3 ml  50.00 ml/m  RA Volume:   42.60 ml  22.83 ml/m LA Vol  (A4C):   100.0 ml 53.59 ml/m LA Biplane Vol: 103.0 ml 55.19 ml/m  AORTIC VALVE                     PULMONIC VALVE AV Area (Vmax):    2.07 cm      PV Vmax:       0.70 m/s AV Area (Vmean):   1.92 cm      PV Peak grad:  1.9 mmHg AV Area (VTI):     1.91 cm AV Vmax:           178.00 cm/s AV Vmean:          121.500 cm/s AV VTI:            0.358 m AV Peak Grad:      12.7 mmHg AV Mean Grad:      6.5 mmHg LVOT Vmax:         117.50 cm/s LVOT Vmean:        74.300 cm/s LVOT VTI:          0.218 m LVOT/AV VTI ratio: 0.61 AI PHT:            414 msec  AORTA Ao Root diam: 3.40 cm Ao Asc diam:  3.40 cm MITRAL VALVE                  TRICUSPID VALVE MV Area (PHT): 5.27 cm       TR Peak grad:   27.2 mmHg MV Area VTI:   3.05 cm       TR Vmax:        261.00 cm/s MV Peak grad:  2.5 mmHg MV Mean grad:  1.0 mmHg       SHUNTS MV Vmax:       0.79 m/s       Systemic VTI:  0.22 m MV Vmean:      48.4 cm/s      Systemic Diam: 2.00 cm MV Decel Time: 144 msec MR Peak grad:    86.9 mmHg MR Mean grad:    55.0 mmHg MR Vmax:         466.00 cm/s MR Vmean:        349.0 cm/s MR PISA:         4.02 cm MR PISA Eff ROA: 80 mm MR PISA Radius:  0.80 cm MV E velocity: 78.60 cm/s MV A velocity: 56.10 cm/s MV E/A ratio:  1.40 [employer] Electronically signed by Adrian Blackwater Signature Date/Time: 05/13/2022/12:18:19  PM    Final    US Venous Img Lower Unilateral Right  Result Date: 05/12/2022 CLINICAL DATA:  Leg swelling EXAM: RIGHT LOWER EXTREMITY VENOUS DOPPLER ULTRASOUND TECHNIQUE: Gray-scale sonography with compression, as well as color and duplex ultrasound, were performed to evaluate the deep venous system(s) from the level of the common femoral vein through the popliteal and proximal calf veins. COMPARISON:  None Available. FINDINGS: VENOUS Normal compressibility of the common femoral, superficial femoral, and popliteal veins, as well as the visualized calf veins. Visualized portions of profunda femoral vein and great saphenous vein unremarkable. No  filling defects to suggest DVT on grayscale or color Doppler imaging. Doppler waveforms show normal direction of venous flow, normal respiratory plasticity and response to augmentation. Limited views of the contralateral common femoral vein are unremarkable. OTHER Soft tissue edema of the lower right leg. Limitations: none IMPRESSION: No evidence of right lower extremity DVT. Electronically Signed   By: Allegra Lai M.D.   On: 05/12/2022 16:58   DG Chest Portable 1 View  Result Date: 05/12/2022 CLINICAL DATA:  Shortness of breath EXAM: PORTABLE CHEST 1 VIEW COMPARISON:  X-ray 01/10/2022 FINDINGS: Left upper chest defibrillator. Heart enlarged. Calcified and tortuous aorta. Mild vascular congestion which is new from previous. Elevated right hemidiaphragm with a tiny right effusion and adjacent atelectasis. No pneumothorax. Overlapping cardiac leads. Osteopenia. IMPRESSION: Developing vascular congestion.  Tiny right effusion. Enlarged heart with calcified aorta and defibrillator Electronically Signed   By: Karen Kays M.D.   On: 05/12/2022 13:55    ECHO: EF 20-25%  TELEMETRY reviewed by me (LT) 05/19/2022: Predominantly AV paced rhythm with occasional PVCs, significantly decreased periods of ectopy without recurrence in VT today  Data reviewed by me (LT) 05/19/2022: Hospitalist progress note, palliative care note last 24h vitals tele labs imaging I/O    Principal Problem:   Acute hypoxic respiratory failure (HCC) Active Problems:   Automatic implantable cardioverter-defibrillator in situ   CKD (chronic kidney disease) stage 3, GFR 30-59 ml/min (HCC)   Acute on chronic systolic CHF (congestive heart failure) (HCC)   CAD (coronary artery disease)   PAF (paroxysmal atrial fibrillation) (HCC)    ASSESSMENT AND PLAN:  Spencer Coleman is an 61yoM with a PMH of chronic HFrEF / NICM, hx VT s/p BiV ICD, paroxysmal AF, CKD 3, HTN, HLD who presented to [facility] ED 05/12/2022 with shortness of breath. Tele strips  in the ED noted a wide complex tachycardia that did not terminate with IV adenosine, most c/w VT. BNP elevated to 3000 on admission and CXR with vascular congestion c/w acute on chronic HFrEF.   # acute on chronic HFrEF  # s/p ICD Clinically euvolemic on exam today, weaned to room air. -S/p 1 dose of IV Lasix 40 mg this morning, will likely restart p.o. diuretics tomorrow morning - Wean O2 as volume status improves and as patient tolerates - Monitor and replenish electrolytes for a goal K > 4 and mag > 2 - Continue GDMT with metoprolol and losartan but at reduced dosing due to borderline soft blood pressures. SGLT2i and MRA limited by BP/CKD. Hold parameters in place for BP <100/60.   # recurrent VT  # paroxysmal AF  Known history of VT with recurrence historically in the setting of noncompliance with amiodarone. Had multiple periods of a wide complex tachycardia on tele the past several days, however he is predominantly in a paced rhythm with occasional PVCs on telemetry overnight into this morning. Noted the patient's defibrillator  settings will not shock him out of VT, but will pace him out of VF.  - continue amiodarone to 400mg  BID x 10 days starting 05/17/2022, then 200mg  daily thereafter - continue metoprolol as ordered  - continue eliquis 2.5mg  BID for stroke prevention   # CKD 3  Renal function stable following diuresis. Continue to monitor closely.   Anticipate discharge readiness from a cardiac perspective tomorrow morning.  This patient's plan of care was discussed and created with Dr. Darrold Junker and he is in agreement.  Signed: Rebeca Allegra , PA-C 05/19/2022, 11:33 AM [facility] Card[facility]

## 2022-05-19 NOTE — Progress Notes (Signed)
Physical Therapy Treatment Patient Details Name: Spencer Coleman MRN: [id number] DOB: Jun 12, 1930 Today's Date: 05/19/2022   History of Present Illness Pt is an 89 y.o. male with history of hypertension, hyperlipidemia, CKD, CHF, AICD, work up for acute on chronic systolic CHF exacerbation.    PT Comments    Pt received in bed, slid down towards foot board. Pt able to transfer supine to sit with ModI. Good upright sitting balance for several minutes for B LE AROM. Nursing stated pt had just returned to bed and had ambulated in hall with RW x 2 ~160+ ft. With Supervision. Pt assisted back to supine with MinA for proper positioning/comfort to rest. Pt remains cognitively impaired at times, however functionally is doing well and improving daily. Pt will benefit from continued PT per POC until cleared for d/c.   Recommendations for follow up therapy are one component of a multi-disciplinary discharge planning process, led by the attending physician.  Recommendations may be updated based on patient status, additional functional criteria and insurance authorization.  Follow Up Recommendations       Assistance Recommended at Discharge Frequent or constant Supervision/Assistance  Patient can return home with the following Assist for transportation;Direct supervision/assist for medications management;A little help with walking and/or transfers;A little help with bathing/dressing/bathroom   Equipment Recommendations  None recommended by PT    Recommendations for Other Services       Precautions / Restrictions Precautions Precautions: Fall Restrictions Weight Bearing Restrictions: No     Mobility  Bed Mobility Overal bed mobility: Modified Independent             General bed mobility comments: Min extra time and effort only    Transfers Overall transfer level: Needs assistance Equipment used: Rolling walker (2 wheels) Transfers: Sit to/from Stand Sit to Stand: Min assist            General transfer comment:  (VC for hand placement and directional cues)    Ambulation/Gait Ambulation/Gait assistance: Min guard Gait Distance (Feet): 3 Feet Assistive device: Rolling walker (2 wheels) Gait Pattern/deviations:  (Side stepping towards HOB) Gait velocity: decreased     General Gait Details:  (Pt had ambulated with staff x 2 around nurses station prior to PT arrival)   Optometrist    Modified Rankin (Stroke Patients Only)       Balance Overall balance assessment: Needs assistance Sitting-balance support: Feet supported Sitting balance-Leahy Scale: Good     Standing balance support: Bilateral upper extremity supported, During functional activity, Reliant on assistive device for balance Standing balance-Leahy Scale: Fair                              Cognition Arousal/Alertness: Awake/alert Behavior During Therapy: WFL for tasks assessed/performed Overall Cognitive Status: Within Functional Limits for tasks assessed                                 General Comments: Pt is alert and oriented grossly to place ("hospital") Also, thought he was getting his car fixed        Exercises Total Joint Exercises Ankle Circles/Pumps: AROM, Both, 5 reps, Seated Long Arc Quad: AROM, Both, 10 reps, Seated    General Comments General comments (skin integrity, edema, etc.):  (Pt very pleasant and cooperative)  Pertinent Vitals/Pain Pain Assessment Pain Assessment: No/denies pain    Home Living                          Prior Function            PT Goals (current goals can now be found in the care plan section) Acute Rehab PT Goals Patient Stated Goal: to go home Progress towards PT goals: Progressing toward goals    Frequency    Min 2X/week      PT Plan Current plan remains appropriate    Co-evaluation              AM-PAC PT "6 Clicks" Mobility   Outcome  Measure  Help needed turning from your back to your side while in a flat bed without using bedrails?: None Help needed moving from lying on your back to sitting on the side of a flat bed without using bedrails?: None Help needed moving to and from a bed to a chair (including a wheelchair)?: A Little Help needed standing up from a chair using your arms (e.g., wheelchair or bedside chair)?: A Little Help needed to walk in hospital room?: A Little Help needed climbing 3-5 steps with a railing? : A Little 6 Click Score: 20    End of Session Equipment Utilized During Treatment: Gait belt Activity Tolerance: Patient tolerated treatment well Patient left: in bed;with call bell/phone within reach;with bed alarm set Nurse Communication: Mobility status PT Visit Diagnosis: Other abnormalities of gait and mobility (R26.89);Muscle weakness (generalized) (M62.81)     Time: 6578-4696 PT Time Calculation (min) (ACUTE ONLY): 18 min  Charges:  $Therapeutic Activity: 8-22 mins                    Zadie Cleverly, PTA   Jannet Askew 05/19/2022, 3:12 PM

## 2022-05-19 NOTE — Progress Notes (Addendum)
Palliative Care Progress Note, Assessment & Plan   Patient Name: Spencer Coleman       Date: 05/19/2022 DOB: 1930-11-02  Age: 89 y.o. MRN#: [id number] Attending Physician: Lurene Shadow, MD Primary Care Physician: Barbette Reichmann, MD Admit Date: 05/12/2022  Subjective: Patient is sitting up in bed in no apparent distress.  He acknowledges my presence and is able to make his wishes known.  His son and sister are at bedside.  HPI: 89 y.o. male  with past medical history of PAF, Ventricular Tachycardia s/p BiV ICD (2009), HLD, HTN, dilated cardiomyopathy, chronic systolic heart failure and CKD admitted from home on 05/12/2022 with complaints of shortness of breath. EMS was called by sister as she was concerned about ongoing weakness and possible UTI being managed as outpatient.   On arrival to ED, found to be in wide-complex tachycardia (140's) with soft SBP (80-90's). BNP >3,000 and CXR concerning for vascular congestion. Started on amiodarone infusion, initially admitted to ICU due to concern for needing vasopressor support given hypotension-vasopressors were not initiated    Transitioned to PO amiodarone 5/3, tolerating well   Episodes of confusion, AMS, agitation throughout hospitalization, mentation seems to wax and wane   Palliative medicine was consulted for assisting with goals of care conversations.  Summary of counseling/coordination of care: After reviewing the patient's chart and assessing the patient at bedside, I spoke with patient and family at bedside in regards to plan and goals of care.  Patient's son requested a medical update.  We discussed patient's heart failure, A-fib, pacemaker and wide complex tachycardia on admission, functional status, and advanced age as significant contributors to  patient's overall prognosis.   I outlined discussion that I had with patient's HCPOA/daughter [name] over the phone yesterday.  I highlighted discussion of CODE STATUS.  In the event of a cardiopulmonary arrest, patient would not want CPR, defibrillation, or use of mechanical ventilatory support.  Patient states that if it is time comes he wants to "go along with the Lord".  Patient verbalized understanding that in the event he is pulseless and not breathing that we would allow a natural passing.  Patient's in agreement and says he is not worried about dying.  He just does not want to be in pain or have his family suffer.  Patient has confirmed DNR and DNI status as reflected in chart and as per [facility] wishes.  Additionally, I confirmed that in the event that patient is unable to speak for himself he wants his daughter [name] to remain his HCPOA and next of kin decision maker.  We discussed next steps for patient.  Patient wants to return home.  We discussed recommendations remain for [facility] spacious PT.  Patient Sister [name] shares concern that patient cannot return home without full supervision.  I encouraged patient and family to continue communication with TOC who is following closely for discharge planning.  PMT will remain available to patient and family for any acute palliative needs.  PMT contact info given and family encouraged to call with any future questions or concerns.  PMT will continue to follow the patient and support he and his family throughout this hospitalization.  Physical Exam Constitutional:  General: He is not in acute distress.    Appearance: He is normal weight. He is not ill-appearing.  HENT:     Head: Normocephalic.  Cardiovascular:     Rate and Rhythm: Normal rate.  Pulmonary:     Effort: Pulmonary effort is normal.  Skin:    General: Skin is warm and dry.  Neurological:     Mental Status: He is alert.     Comments: Oriented to self and place   Psychiatric:        Mood and Affect: Mood is not anxious.        Behavior: Behavior normal. Behavior is not agitated.             Total Time 35 minutes   Krissie Merrick L. Manon Hilding, FNP-BC Palliative Medicine Team Team Phone # [phone]

## 2022-05-20 DIAGNOSIS — I959 Hypotension, unspecified: Secondary | ICD-10-CM | POA: Diagnosis not present

## 2022-05-20 DIAGNOSIS — I509 Heart failure, unspecified: Secondary | ICD-10-CM | POA: Diagnosis not present

## 2022-05-20 DIAGNOSIS — I5023 Acute on chronic systolic (congestive) heart failure: Secondary | ICD-10-CM | POA: Diagnosis not present

## 2022-05-20 DIAGNOSIS — J9601 Acute respiratory failure with hypoxia: Secondary | ICD-10-CM | POA: Diagnosis not present

## 2022-05-20 DIAGNOSIS — Z95810 Presence of automatic (implantable) cardiac defibrillator: Secondary | ICD-10-CM

## 2022-05-20 DIAGNOSIS — R000 Tachycardia, unspecified: Secondary | ICD-10-CM | POA: Diagnosis not present

## 2022-05-20 LAB — BASIC METABOLIC PANEL
Anion gap: 8 (ref 5–15)
BUN: 41 mg/dL — ABNORMAL HIGH (ref 8–23)
CO2: 29 mmol/L (ref 22–32)
Calcium: 9 mg/dL (ref 8.9–10.3)
Chloride: 101 mmol/L (ref 98–111)
Creatinine, Ser: 1.58 mg/dL — ABNORMAL HIGH (ref 0.61–1.24)
GFR, Estimated: 42 mL/min — ABNORMAL LOW (ref >60)
Glucose, Bld: 91 mg/dL (ref 70–99)
Potassium: 4.5 mmol/L (ref 3.5–5.1)
Sodium: 138 mmol/L (ref 135–145)

## 2022-05-20 LAB — CBC
HCT: 40.8 % (ref 39.0–52.0)
Hemoglobin: 13.1 g/dL (ref 13.0–17.0)
MCH: 29.8 pg (ref 26.0–34.0)
MCHC: 32.1 g/dL (ref 30.0–36.0)
MCV: 92.7 fL (ref 80.0–100.0)
Platelets: 124 10*3/uL — ABNORMAL LOW (ref 150–400)
RBC: 4.4 MIL/uL (ref 4.22–5.81)
RDW: 15.5 % (ref 11.5–15.5)
WBC: 6.4 10*3/uL (ref 4.0–10.5)
nRBC: 0 % (ref 0.0–0.2)

## 2022-05-20 LAB — MAGNESIUM: Magnesium: 2.5 mg/dL — ABNORMAL HIGH (ref 1.7–2.4)

## 2022-05-20 MED ORDER — FUROSEMIDE 40 MG PO TABS
40.0000 mg | ORAL_TABLET | Freq: Every day | ORAL | Status: DC
  Administered 2022-05-21 – 2022-05-31 (×11): 40 mg via ORAL
  Filled 2022-05-20 (×11): qty 1

## 2022-05-20 MED ORDER — TORSEMIDE 20 MG PO TABS: 20.0000 mg | ORAL_TABLET | Freq: Every day | ORAL | Status: DC

## 2022-05-20 MED ORDER — TORSEMIDE 20 MG PO TABS
20.0000 mg | ORAL_TABLET | Freq: Every day | ORAL | Status: AC
  Administered 2022-05-20: 20 mg via ORAL
  Filled 2022-05-20: qty 1

## 2022-05-20 NOTE — Progress Notes (Signed)
Mobility Specialist - Progress Note   05/20/22 1100  Mobility  Activity Ambulated with assistance in room;Ambulated with assistance to bathroom  Level of Assistance Standby assist, set-up cues, supervision of patient - no hands on  Assistive Device Front wheel walker  Distance Ambulated (ft) 15 ft  Activity Response Tolerated well  Mobility Referral Yes  $Mobility charge 1 Mobility  Mobility Specialist Start Time (ACUTE ONLY) [id number]  Mobility Specialist Stop Time (ACUTE ONLY) 1005  Mobility Specialist Time Calculation (min) (ACUTE ONLY) 17 min     Pt attempting to exit bed on arrival, utilizing RA. STS with minA from standard bed height. VC for staying close/inside RW. No LOB. Mildly impulsive. Pt ambulated to bathroom for BM, able to complete seated peri-care without assist. O2 desat to low 80s with activity but quickly improved to mid 90s. Pt returned to bed with alarm set, needs in reach.    Filiberto Pinks Mobility Specialist 05/20/22, 11:33 AM

## 2022-05-20 NOTE — Plan of Care (Signed)

## 2022-05-20 NOTE — Progress Notes (Signed)
Progress Note    Spencer Coleman  ONG:[id number] DOB: [birth year]  DOA: 05/12/2022 PCP: Barbette Reichmann, MD      Brief Narrative:    Medical records reviewed and are as summarized below:  Spencer Coleman is a 89 y.o. male PMH of Paroxysmal Atrial Fibrillation, Ventricular Tachycardia s/p BiV ICD (08/09/2007), HLD, HTN, Dilated Cardiomyopathy, Chronic Systolic CHF,  Chronic Kidney Disease, who presented to the hospital with shortness of breath.       Assessment/Plan:   Principal Problem:   Acute hypoxic respiratory failure (HCC) Active Problems:   Acute on chronic systolic CHF (congestive heart failure) (HCC)   CAD (coronary artery disease)   PAF (paroxysmal atrial fibrillation) (HCC)   Automatic implantable cardioverter-defibrillator in situ   CKD (chronic kidney disease) stage 3, GFR 30-59 ml/min (HCC)   Body mass index is 21.29 kg/m.    Acute on chronic systolic CHF, peripheral edema: S/p AICD: S/p treatment with IV Lasix.  Start torsemide 20 mg daily.  2D echo showed EF estimated at 20 to 25%, grade 2 diastolic dysfunction, moderate LVH.  BNP was 3,155.   Acute hypoxic respiratory failure: Resolved.  Oxygen saturation with ambulation was 96% on room air.   Paroxysmal atrial fibrillation: Continue amiodarone, metoprolol and Eliquis   Hypotension: Losartan has been discontinued.  Continue metoprolol.   Monitor BP closely   Acute UTI: No fever or leukocytosis.  Completed 3 days of cefdinir and cefuroxime on 05/19/2022.  Urine culture did not show any significant growth.   CKD stage IIIa: Creatinine is stable.   Memory impairment, suspected underlying dementia: Code stroke called on 05/16/2022 but CT head did not show any acute abnormality.  Altered mental status was attributed to Ativan at that time.   Thrombocytopenia: Fluctuating but stable platelet count.   History of falls at home: Continue ambulation as able with mobility specialist   Diet Order              Diet 2 gram sodium Fluid consistency: Thin  Diet effective now                            Consultants: Cardiologist Palliative care  Procedures: None    Medications:    amiodarone  400 mg Oral BID   Followed by   Melene Muller ON 05/28/2022] amiodarone  200 mg Oral Daily   apixaban  2.5 mg Oral BID   metoprolol succinate  12.5 mg Oral Daily   QUEtiapine  12.5 mg Oral QHS   simvastatin  40 mg Oral q1800   Continuous Infusions:  sodium chloride       Anti-infectives (From admission, onward)    Start     Dose/Rate Route Frequency Ordered Stop   05/18/22 0800  cefUROXime (CEFTIN) tablet 250 mg        250 mg Oral 2 times daily with meals 05/17/22 1428 05/19/22 1637   05/17/22 1200  cefdinir (OMNICEF) capsule 300 mg  Status:  Discontinued        300 mg Oral Daily 05/17/22 1113 05/17/22 1428              Family Communication/Anticipated D/C date and plan/Code Status   DVT prophylaxis: apixaban (ELIQUIS) tablet 2.5 mg Start: 05/12/22 2200 SCDs Start: 05/12/22 1552 apixaban (ELIQUIS) tablet 2.5 mg     Code Status: DNR  Family Communication: None Disposition Plan: Plan to discharge home in 2 to 3 days  Status is: Inpatient Remains inpatient appropriate because: No safe discharge plan       Subjective:   Interval events noted.  No complaints.  No shortness of breath or chest pain.  Objective:    Vitals:   05/20/22 0412 05/20/22 0500 05/20/22 0724 05/20/22 1146  BP: (!) 90/57  108/66 94/61  Pulse: 70  70 71  Resp: 18  17 16   Temp: 98.1 F (36.7 C)  98 F (36.7 C) 98.1 F (36.7 C)  TempSrc: Oral  Axillary   SpO2: 94%  97% 96%  Weight:  67.3 kg    Height:       No data found.   Intake/Output Summary (Last 24 hours) at 05/20/2022 1304 Last data filed at 05/20/2022 0452 Gross per 24 hour  Intake 360 ml  Output 600 ml  Net -240 ml   Filed Weights   05/19/22 0653 05/19/22 0752 05/20/22 0500  Weight: 67 kg 67 kg 67.3 kg     Exam:   GEN: NAD SKIN: Warm and dry EYES: No pallor or icterus ENT: MMM CV: RRR PULM: CTA B ABD: soft, ND, NT, +BS CNS: AAO x 1 (person), non focal EXT: Swelling in the distal legs and feet.  No tenderness or erythema      Data Reviewed:   I have personally reviewed following labs and imaging studies:  Labs: Labs show the following:   Basic Metabolic Panel: Recent Labs  Lab 05/14/22 0411 05/15/22 0455 05/16/22 0419 05/17/22 0522 05/18/22 0457 05/19/22 0357 05/20/22 0553  NA 138   < > 138 139 139 138 138  K 4.0   < > 3.9 3.9 3.8 3.9 4.5  CL 103   < > 103 105 102 100 101  CO2 25   < > 24 26 30 30 29   GLUCOSE 99   < > 114* 89 116* 112* 91  BUN 25*   < > 40* 37* 35* 38* 41*  CREATININE 1.46*   < > 1.55* 1.43* 1.49* 1.39* 1.58*  CALCIUM 9.0   < > 9.1 8.8* 8.6* 8.9 9.0  MG 2.1  --   --   --  2.8* 2.4 2.5*   < > = values in this interval not displayed.   GFR Estimated Creatinine Clearance: 30.2 mL/min (A) (by C-G formula based on SCr of 1.58 mg/dL (H)). Liver Function Tests: No results for input(s): "AST", "ALT", "ALKPHOS", "BILITOT", "PROT", "ALBUMIN" in the last 168 hours.  No results for input(s): "LIPASE", "AMYLASE" in the last 168 hours. No results for input(s): "AMMONIA" in the last 168 hours. Coagulation profile No results for input(s): "INR", "PROTIME" in the last 168 hours.  CBC: Recent Labs  Lab 05/16/22 0419 05/17/22 0522 05/18/22 0457 05/19/22 0357 05/20/22 0553  WBC 6.2 6.2 8.1 6.5 6.4  HGB 13.6 12.9* 13.1 13.4 13.1  HCT 42.8 42.0 41.2 42.0 40.8  MCV 93.4 97.7 93.6 92.7 92.7  PLT 142* 102* 121* 105* 124*   Cardiac Enzymes: No results for input(s): "CKTOTAL", "CKMB", "CKMBINDEX", "TROPONINI" in the last 168 hours. BNP (last 3 results) No results for input(s): "PROBNP" in the last 8760 hours. CBG: Recent Labs  Lab 05/14/22 2353  GLUCAP 111*   D-Dimer: No results for input(s): "DDIMER" in the last 72 hours. Hgb A1c: No results  for input(s): "HGBA1C" in the last 72 hours. Lipid Profile: No results for input(s): "CHOL", "HDL", "LDLCALC", "TRIG", "CHOLHDL", "LDLDIRECT" in the last 72 hours. Thyroid function studies: No results for input(s): "TSH", "  T4TOTAL", "T3FREE", "THYROIDAB" in the last 72 hours.  Invalid input(s): "FREET3" Anemia work up: No results for input(s): "VITAMINB12", "FOLATE", "FERRITIN", "TIBC", "IRON", "RETICCTPCT" in the last 72 hours. Sepsis Labs: Recent Labs  Lab 05/17/22 0522 05/18/22 0457 05/19/22 0357 05/20/22 0553  WBC 6.2 8.1 6.5 6.4    Microbiology Recent Results (from the past 240 hour(s))  SARS Coronavirus 2 by RT PCR (hospital order, performed in [facility] hospital lab) *cepheid single result test* Anterior Nasal Swab     Status: None   Collection Time: 05/12/22  3:54 PM   Specimen: Anterior Nasal Swab  Result Value Ref Range Status   SARS Coronavirus 2 by RT PCR NEGATIVE NEGATIVE Final    Comment: (NOTE) SARS-CoV-2 target nucleic acids are NOT DETECTED.  The SARS-CoV-2 RNA is generally detectable in upper and lower respiratory specimens during the acute phase of infection. The lowest concentration of SARS-CoV-2 viral copies this assay can detect is 250 copies / mL. A negative result does not preclude SARS-CoV-2 infection and should not be used as the sole basis for treatment or other patient management decisions.  A negative result may occur with improper specimen collection / handling, submission of specimen other than nasopharyngeal swab, presence of viral mutation(s) within the areas targeted by this assay, and inadequate number of viral copies (<250 copies / mL). A negative result must be combined with clinical observations, patient history, and epidemiological information.  Fact Sheet for Patients:   RoadLapTop.co.za  Fact Sheet for Healthcare Providers: http://kim-miller.com/  This test is not yet approved or   cleared by the [place] FDA and has been authorized for detection and/or diagnosis of SARS-CoV-2 by FDA under an Emergency Use Authorization (EUA).  This EUA will remain in effect (meaning this test can be used) for the duration of the COVID-19 declaration under Section 564(b)(1) of the Act, 21 U.S.C. section 360bbb-3(b)(1), unless the authorization is terminated or revoked sooner.  Performed at [facility], [address] [place], [place] [address]   MRSA Next Gen by PCR, Nasal     Status: None   Collection Time: 05/12/22  5:12 PM   Specimen: Nasal Mucosa; Nasal Swab  Result Value Ref Range Status   MRSA by PCR Next Gen NOT DETECTED NOT DETECTED Final    Comment: (NOTE) The GeneXpert MRSA Assay (FDA approved for NASAL specimens only), is one component of a comprehensive MRSA colonization surveillance program. It is not intended to diagnose MRSA infection nor to guide or monitor treatment for MRSA infections. Test performance is not FDA approved in patients less than 25 years old. Performed at [facility], [address] [place], [place] [address]   Culture, blood (Routine X 2) w Reflex to ID Panel     Status: None   Collection Time: 05/12/22  5:33 PM   Specimen: BLOOD  Result Value Ref Range Status   Specimen Description BLOOD BLOOD LEFT ARM  Final   Special Requests   Final    BOTTLES DRAWN AEROBIC AND ANAEROBIC Blood Culture adequate volume   Culture   Final    NO GROWTH 5 DAYS Performed at [facility], [address], [place], [place] [address]    Report Status 05/17/2022 FINAL  Final  Culture, blood (Routine X 2) w Reflex to ID Panel     Status: None   Collection Time: 05/12/22  5:33 PM   Specimen: BLOOD  Result Value Ref Range Status   Specimen Description BLOOD BLOOD RIGHT  HAND [facility]  Final   Special Requests   Final    BOTTLES DRAWN AEROBIC AND ANAEROBIC Blood Culture results may not be optimal due to an inadequate volume of  blood received in culture bottles   Culture   Final    NO GROWTH 5 DAYS Performed at [facility], [address] [place], [place] [address]    Report Status 05/17/2022 FINAL  Final  Urine Culture     Status: Abnormal   Collection Time: 05/12/22  9:38 PM   Specimen: Urine, Clean Catch  Result Value Ref Range Status   Specimen Description   Final    URINE, CLEAN CATCH Performed at [facility], [address] [place], [place] [address]    Special Requests   Final    NONE Performed at [facility], [address] [place], [place] [address]    Culture (A)  Final    <10,000 COLONIES/mL INSIGNIFICANT GROWTH Performed at [facility], [address] [place], [place] [address]    Report Status 05/14/2022 FINAL  Final  Respiratory (~20 pathogens) panel by PCR     Status: None   Collection Time: 05/12/22  9:38 PM   Specimen: Nasopharyngeal Swab; Respiratory  Result Value Ref Range Status   Adenovirus NOT DETECTED NOT DETECTED Final   Coronavirus 229E NOT DETECTED NOT DETECTED Final    Comment: (NOTE) The Coronavirus on the Respiratory Panel, DOES NOT test for the novel  Coronavirus (2019 nCoV)    Coronavirus HKU1 NOT DETECTED NOT DETECTED Final   Coronavirus NL63 NOT DETECTED NOT DETECTED Final   Coronavirus OC43 NOT DETECTED NOT DETECTED Final   Metapneumovirus NOT DETECTED NOT DETECTED Final   Rhinovirus / Enterovirus NOT DETECTED NOT DETECTED Final   Influenza A NOT DETECTED NOT DETECTED Final   Influenza B NOT DETECTED NOT DETECTED Final   Parainfluenza Virus 1 NOT DETECTED NOT DETECTED Final   Parainfluenza Virus 2 NOT DETECTED NOT DETECTED Final   Parainfluenza Virus 3 NOT DETECTED NOT DETECTED Final   Parainfluenza Virus 4 NOT DETECTED NOT DETECTED Final   Respiratory Syncytial Virus NOT DETECTED NOT DETECTED Final   Bordetella pertussis NOT DETECTED NOT DETECTED Final   Bordetella Parapertussis NOT DETECTED NOT DETECTED Final    Chlamydophila pneumoniae NOT DETECTED NOT DETECTED Final   Mycoplasma pneumoniae NOT DETECTED NOT DETECTED Final    Comment: Performed at [facility], [address] [place], [place] [address]    Procedures and diagnostic studies:  No results found.             LOS: 8 days   Meris Reede  [facility]   Pager on [email or web address]. If 7PM-7AM, please contact night-coverage at [email or web address]     05/20/2022, 1:04 PM

## 2022-05-20 NOTE — TOC Progression Note (Signed)
Transition of Care [facility]) - Progression Note    Patient Details  Name: Spencer Coleman MRN: [id number] Date of Birth: May 16, 1930  [facility] of [facility] CM/SW Contact  Truddie Hidden, RN Phone Number: 05/20/2022, 10:32 AM  Clinical Narrative:    Attempt to reach patient's daughter to discuss discharge plans. No answer. Left a message requesting return call.         Expected Discharge Plan and Services                                               Social Determinants of Health (SDOH) Interventions SDOH Screenings   Food Insecurity: No Food Insecurity (05/16/2022)  Housing: Low Risk  (05/16/2022)  Transportation Needs: No Transportation Needs (05/16/2022)  Utilities: Not At Risk (05/16/2022)  Tobacco Use: Low Risk  (05/18/2022)    Readmission Risk Interventions     No data to display

## 2022-05-20 NOTE — Progress Notes (Signed)
[facility] CLINIC CARDIOLOGY CONSULT NOTE       Patient ID: Spencer Coleman MRN: [id number] DOB/AGE: 1930-09-30 89 y.o.  Admit date: 05/12/2022 Referring Physician Dr. Artis Delay Primary Physician Dr. Marcello Fennel Primary Cardiologist Dr. Darrold Junker Reason for Consultation wide complex tachycardia   HPI: Spencer Coleman is an 68yoM with a PMH of chronic HFrEF / NICM, hx VT s/p BiV ICD, paroxysmal AF, CKD 3, HTN, HLD who presented to [facility] ED 05/12/2022 with shortness of breath. Tele strips in the ED noted a wide complex tachycardia that did not terminate with IV adenosine, most c/w VT. BNP elevated to 3000 on admission and CXR with vascular congestion c/w acute on chronic HFrEF.   Interval history:  -No acute events -Denies chest pain or shortness of breath, lightheadedness or dizziness -Remains in an AV paced rhythm without recurrence of VT on telemetry in the past 48 hours -Remains pleasantly confused, disoriented to situation  Review of systems complete and found to be negative unless listed above     Past Medical History:  Diagnosis Date   CAD (coronary artery disease)    Nonobstructive cardiac disease by cath   CHF (congestive heart failure) (HCC)    nonischemic cardiomyopathy   CKD (chronic kidney disease)    History of kidney stones    Hyperlipidemia    Hypertension    Inguinal hernia    PAF (paroxysmal atrial fibrillation) (HCC)    Patella fracture 01/09/2015   Renal colic 10/07/2011   Ventricular tachycardia [facility])    s/p ICD    Past Surgical History:  Procedure Laterality Date   CARDIAC CATHETERIZATION     CARDIAC DEFIBRILLATOR PLACEMENT  2009   CYSTOSCOPY WITH LITHOLAPAXY N/A 02/21/2018   Procedure: CYSTOSCOPY WITH LITHOLAPAXY;  Surgeon: Sondra Come, MD;  Location: [facility] ORS;  Service: Urology;  Laterality: N/A;   ELBOW SURGERY Left    HOLMIUM LASER APPLICATION N/A 02/21/2018   Procedure: HOLMIUM LASER APPLICATION;  Surgeon: Sondra Come, MD;  Location: [facility] ORS;   Service: Urology;  Laterality: N/A;   ICD GENERATOR CHANGEOUT N/A 01/28/2021   Procedure: ICD GENERATOR CHANGEOUT;  Surgeon: Marcina Millard, MD;  Location: [facility] INVASIVE CV LAB;  Service: Cardiovascular;  Laterality: N/A;   INGUINAL HERNIA REPAIR     LITHOTRIPSY     ORIF left wrist fracture Left    ORIF PATELLA Left 01/10/2015   Procedure: OPEN REDUCTION INTERNAL (ORIF) FIXATION PATELLA;  Surgeon: Donato Heinz, MD;  Location: [facility] ORS;  Service: Orthopedics;  Laterality: Left;   ORIF PATELLA Left 02/06/2015   Procedure: OPEN REDUCTION INTERNAL (ORIF) FIXATION PATELLA;  Surgeon: Donato Heinz, MD;  Location: [facility] ORS;  Service: Orthopedics;  Laterality: Left;   TRANSURETHRAL RESECTION OF PROSTATE N/A 02/21/2018   Procedure: TRANSURETHRAL RESECTION OF THE PROSTATE (TURP);  Surgeon: Sondra Come, MD;  Location: [facility] ORS;  Service: Urology;  Laterality: N/A;    Medications Prior to Admission  Medication Sig Dispense Refill Last Dose   acetaminophen (TYLENOL) 500 MG tablet Take 2 tablets (1,000 mg total) by mouth every 6 (six) hours. 30 tablet 0 unknown   amiodarone (PACERONE) 400 MG tablet Take 400 mg by mouth daily.   05/11/2022   apixaban (ELIQUIS) 2.5 MG TABS tablet Take 1 tablet (2.5 mg total) by mouth 2 (two) times daily. 60 tablet 1 05/11/2022   furosemide (LASIX) 20 MG tablet Take 20 mg by mouth daily.   05/11/2022   HYDROcodone-acetaminophen (NORCO/VICODIN) 5-325 MG tablet Take 1 tablet  by mouth every 6 (six) hours as needed for moderate pain.   unknown   metoprolol succinate (TOPROL-XL) 25 MG 24 hr tablet Take 25 mg by mouth daily.   05/11/2022   simvastatin (ZOCOR) 40 MG tablet Take 40 mg by mouth at bedtime.   05/11/2022   Vitamin D, Ergocalciferol, (DRISDOL) 1.25 MG (50000 UNIT) CAPS capsule Take 50,000 Units by mouth once a week.   Past Week   Social History   Socioeconomic History   Marital status: Single    Spouse name: Not on file   Number of children: Not on file   Years of  education: Not on file   Highest education level: Not on file  Occupational History   Occupation: retired  Tobacco Use   Smoking status: Never   Smokeless tobacco: Never  Vaping Use   Vaping Use: Never used  Substance and Sexual Activity   Alcohol use: No    Alcohol/week: 0.0 standard drinks of alcohol   Drug use: No   Sexual activity: Not on file  Other Topics Concern   Not on file  Social History Narrative   Lives at home. Independent, no assisted devices at baseline   Social Determinants of Health   Financial Resource Strain: Not on file  Food Insecurity: No Food Insecurity (05/16/2022)   Hunger Vital Sign    Worried About Running Out of Food in the Last Year: Never true    Ran Out of Food in the Last Year: Never true  Transportation Needs: No Transportation Needs (05/16/2022)   PRAPARE - Administrator, Civil Service (Medical): No    Lack of Transportation (Non-Medical): No  Physical Activity: Not on file  Stress: Not on file  Social Connections: Not on file  Intimate Partner Violence: Not At Risk (05/16/2022)   Humiliation, Afraid, Rape, and Kick questionnaire    Fear of Current or Ex-Partner: No    Emotionally Abused: No    Physically Abused: No    Sexually Abused: No    Family History  Problem Relation Age of Onset   CAD Mother    Kidney cancer Father    Prostate cancer Neg Hx    Bladder Cancer Neg Hx       Intake/Output Summary (Last 24 hours) at 05/20/2022 1629 Last data filed at 05/20/2022 0452 Gross per 24 hour  Intake 360 ml  Output 250 ml  Net 110 ml     Vitals:   05/20/22 0500 05/20/22 0724 05/20/22 1146 05/20/22 1543  BP:  108/66 94/61 97/78   Pulse:  70 71 69  Resp:  17 16 16   Temp:  98 F (36.7 C) 98.1 F (36.7 C) 97.8 F (36.6 C)  TempSrc:  Axillary    SpO2:  97% 96% 96%  Weight: 67.3 kg     Height:        PHYSICAL EXAM General: elderly and frail caucasian male, in no acute distress.  Attempting to get out of bed, no family  at bedside  HEENT:  Normocephalic and atraumatic. Neck:  No JVD.  Lungs: Normal respiratory effort on room air.  Decreased breath sounds without appreciable crackles or wheezes.   Heart: HRRR. Normal S1 and S2 without gallops or murmurs.  Abdomen: Non-distended appearing.  Msk: Normal strength and tone for age. Extremities: Warm and well perfused. No clubbing, cyanosis.  Trace right lower extremity edema  neuro: Alert and oriented to self and hospital but disoriented to situation Psych:  Pleasant affect.  Labs: Basic Metabolic Panel: Recent Labs    05/19/22 0357 05/20/22 0553  NA 138 138  K 3.9 4.5  CL 100 101  CO2 30 29  GLUCOSE 112* 91  BUN 38* 41*  CREATININE 1.39* 1.58*  CALCIUM 8.9 9.0  MG 2.4 2.5*    Liver Function Tests: No results for input(s): "AST", "ALT", "ALKPHOS", "BILITOT", "PROT", "ALBUMIN" in the last 72 hours. No results for input(s): "LIPASE", "AMYLASE" in the last 72 hours. CBC: Recent Labs    05/19/22 0357 05/20/22 0553  WBC 6.5 6.4  HGB 13.4 13.1  HCT 42.0 40.8  MCV 92.7 92.7  PLT 105* 124*    Cardiac Enzymes: No results for input(s): "CKTOTAL", "CKMB", "CKMBINDEX", "TROPONINIHS" in the last 72 hours. BNP: No results for input(s): "BNP" in the last 72 hours. D-Dimer: No results for input(s): "DDIMER" in the last 72 hours. Hemoglobin A1C: No results for input(s): "HGBA1C" in the last 72 hours. Fasting Lipid Panel: No results for input(s): "CHOL", "HDL", "LDLCALC", "TRIG", "CHOLHDL", "LDLDIRECT" in the last 72 hours. Thyroid Function Tests: No results for input(s): "TSH", "T4TOTAL", "T3FREE", "THYROIDAB" in the last 72 hours.  Invalid input(s): "FREET3" Anemia Panel: No results for input(s): "VITAMINB12", "FOLATE", "FERRITIN", "TIBC", "IRON", "RETICCTPCT" in the last 72 hours.   Radiology: CT HEAD CODE STROKE WO CONTRAST`  Result Date: 05/16/2022 CLINICAL DATA:  Code stroke.  89 year old male EXAM: CT HEAD WITHOUT CONTRAST TECHNIQUE:  Contiguous axial images were obtained from the base of the skull through the vertex without intravenous contrast. RADIATION DOSE REDUCTION: This exam was performed according to the departmental dose-optimization program which includes automated exposure control, adjustment of the mA and/or kV according to patient size and/or use of iterative reconstruction technique. COMPARISON:  Head CT 06/12/2021. FINDINGS: Brain: Stable cerebral volume from last year. No midline shift, mass effect, or evidence of intracranial mass lesion. No acute intracranial hemorrhage identified. No ventriculomegaly. No cortically based acute infarct identified. Gray-white differentiation does not appear significantly changed from last year, mild for age small vessel disease changes. Vascular: Extensive Calcified atherosclerosis at the skull base. No suspicious intracranial vascular hyperdensity. Skull: No acute osseous abnormality identified. Sinuses/Orbits: Visualized paranasal sinuses and mastoids are stable and well aerated. Other: Visualized orbits and scalp soft tissues are within normal limits. ASPECTS [facility] Stroke Program Early CT Score) Total score (0-10 with 10 being normal): 10 IMPRESSION: 1. No acute cortically based infarct or acute intracranial hemorrhage identified. ASPECTS 10. 2. Mild for age small vessel disease changes. Electronically Signed   By: Odessa Fleming M.D.   On: 05/16/2022 10:24   ECHOCARDIOGRAM COMPLETE  Result Date: 05/13/2022    ECHOCARDIOGRAM REPORT   Patient Name:   Spencer Coleman Date of Exam: 05/13/2022 Medical Rec #:  [id number]    Height:       70.0 in Accession #:    [id number]   Weight:       153.7 lb Date of Birth:  1930-08-11     BSA:          1.866 m Patient Age:    89 years     BP:           89/71 mmHg Patient Gender: M            HR:           75 bpm. Exam Location:  [facility] Procedure: 2D Echo, Cardiac Doppler, Color Doppler and Strain Analysis Indications:     Abnormal ECG  History:  Patient has prior  history of Echocardiogram examinations, most                  recent 06/13/2021. CHF, CAD, Abnormal ECG and Defibrillator,                  Arrythmias:Atrial Fibrillation and Tachycardia; Risk                  Factors:Dyslipidemia and Hypertension.  Sonographer:     Mikki Harbor Referring Phys:  [id number] DANA G NELSON Diagnosing Phys: Adrian Blackwater IMPRESSIONS  1. Left ventricular ejection fraction, by estimation, is 20 to 25%. The left ventricle has severely decreased function. The left ventricle demonstrates global hypokinesis. The left ventricular internal cavity size was severely dilated. There is moderate  left ventricular hypertrophy. Left ventricular diastolic parameters are consistent with Grade II diastolic dysfunction (pseudonormalization).  2. Right ventricular systolic function is moderately reduced. The right ventricular size is moderately enlarged. Mildly increased right ventricular wall thickness. There is normal pulmonary artery systolic pressure.  3. Left atrial size was severely dilated.  4. Right atrial size was severely dilated.  5. The mitral valve is myxomatous. Severe mitral valve regurgitation.  6. Tricuspid valve regurgitation is mild to moderate.  7. The aortic valve is calcified. Aortic valve regurgitation is moderate.  8. Pulmonic valve regurgitation is moderate.  9. Aortic dilatation noted. Conclusion(s)/Recommendation(s): Findings consistent with dilated cardiomyopathy. FINDINGS  Left Ventricle: Left ventricular ejection fraction, by estimation, is 20 to 25%. The left ventricle has severely decreased function. The left ventricle demonstrates global hypokinesis. The left ventricular internal cavity size was severely dilated. There is moderate left ventricular hypertrophy. Left ventricular diastolic parameters are consistent with Grade II diastolic dysfunction (pseudonormalization). Right Ventricle: The right ventricular size is moderately enlarged. Mildly increased right ventricular wall  thickness. Right ventricular systolic function is moderately reduced. There is normal pulmonary artery systolic pressure. The tricuspid regurgitant velocity is 2.61 m/s, and with an assumed right atrial pressure of 8 mmHg, the estimated right ventricular systolic pressure is 35.2 mmHg. Left Atrium: Left atrial size was severely dilated. Right Atrium: Right atrial size was severely dilated. Pericardium: There is no evidence of pericardial effusion. Mitral Valve: The mitral valve is myxomatous. Severe mitral valve regurgitation. MV peak gradient, 2.5 mmHg. The mean mitral valve gradient is 1.0 mmHg. Tricuspid Valve: The tricuspid valve is grossly normal. Tricuspid valve regurgitation is mild to moderate. Aortic Valve: The aortic valve is calcified. Aortic valve regurgitation is moderate. Aortic regurgitation PHT measures 414 msec. Aortic valve mean gradient measures 6.5 mmHg. Aortic valve peak gradient measures 12.7 mmHg. Aortic valve area, by VTI measures 1.91 cm. Pulmonic Valve: The pulmonic valve was grossly normal. Pulmonic valve regurgitation is moderate. Aorta: Aortic dilatation noted. IAS/Shunts: No atrial level shunt detected by color flow Doppler.  LEFT VENTRICLE PLAX 2D LVIDd:         7.00 cm      Diastology LVIDs:         6.50 cm      LV e' medial:    3.16 cm/s LV PW:         0.80 cm      LV E/e' medial:  24.9 LV IVS:        0.90 cm      LV e' lateral:   4.70 cm/s LVOT diam:     2.00 cm      LV E/e' lateral: 16.7 LV SV:  68 LV SV Index:   37 LVOT Area:     3.14 cm  LV Volumes (MOD) LV vol d, MOD A2C: 222.0 ml LV vol d, MOD A4C: 177.0 ml LV vol s, MOD A2C: 165.0 ml LV vol s, MOD A4C: 137.0 ml LV SV MOD A2C:     57.0 ml LV SV MOD A4C:     177.0 ml LV SV MOD BP:      46.7 ml RIGHT VENTRICLE RV Basal diam:  3.65 cm RV Mid diam:    3.30 cm RV S prime:     10.20 cm/s TAPSE (M-mode): 1.3 cm LEFT ATRIUM              Index        RIGHT ATRIUM           Index LA diam:        5.40 cm  2.89 cm/m   RA Area:      15.60 cm LA Vol (A2C):   93.3 ml  50.00 ml/m  RA Volume:   42.60 ml  22.83 ml/m LA Vol (A4C):   100.0 ml 53.59 ml/m LA Biplane Vol: 103.0 ml 55.19 ml/m  AORTIC VALVE                     PULMONIC VALVE AV Area (Vmax):    2.07 cm      PV Vmax:       0.70 m/s AV Area (Vmean):   1.92 cm      PV Peak grad:  1.9 mmHg AV Area (VTI):     1.91 cm AV Vmax:           178.00 cm/s AV Vmean:          121.500 cm/s AV VTI:            0.358 m AV Peak Grad:      12.7 mmHg AV Mean Grad:      6.5 mmHg LVOT Vmax:         117.50 cm/s LVOT Vmean:        74.300 cm/s LVOT VTI:          0.218 m LVOT/AV VTI ratio: 0.61 AI PHT:            414 msec  AORTA Ao Root diam: 3.40 cm Ao Asc diam:  3.40 cm MITRAL VALVE                  TRICUSPID VALVE MV Area (PHT): 5.27 cm       TR Peak grad:   27.2 mmHg MV Area VTI:   3.05 cm       TR Vmax:        261.00 cm/s MV Peak grad:  2.5 mmHg MV Mean grad:  1.0 mmHg       SHUNTS MV Vmax:       0.79 m/s       Systemic VTI:  0.22 m MV Vmean:      48.4 cm/s      Systemic Diam: 2.00 cm MV Decel Time: 144 msec MR Peak grad:    86.9 mmHg MR Mean grad:    55.0 mmHg MR Vmax:         466.00 cm/s MR Vmean:        349.0 cm/s MR PISA:         4.02 cm MR PISA Eff ROA: 80 mm MR PISA Radius:  0.80 cm MV E  velocity: 78.60 cm/s MV A velocity: 56.10 cm/s MV E/A ratio:  1.40 Shaukat Khan Electronically signed by Adrian Blackwater Signature Date/Time: 05/13/2022/12:18:19 PM    Final    US Venous Img Lower Unilateral Right  Result Date: 05/12/2022 CLINICAL DATA:  Leg swelling EXAM: RIGHT LOWER EXTREMITY VENOUS DOPPLER ULTRASOUND TECHNIQUE: Gray-scale sonography with compression, as well as color and duplex ultrasound, were performed to evaluate the deep venous system(s) from the level of the common femoral vein through the popliteal and proximal calf veins. COMPARISON:  None Available. FINDINGS: VENOUS Normal compressibility of the common femoral, superficial femoral, and popliteal veins, as well as the visualized calf  veins. Visualized portions of profunda femoral vein and great saphenous vein unremarkable. No filling defects to suggest DVT on grayscale or color Doppler imaging. Doppler waveforms show normal direction of venous flow, normal respiratory plasticity and response to augmentation. Limited views of the contralateral common femoral vein are unremarkable. OTHER Soft tissue edema of the lower right leg. Limitations: none IMPRESSION: No evidence of right lower extremity DVT. Electronically Signed   By: Allegra Lai M.D.   On: 05/12/2022 16:58   DG Chest Portable 1 View  Result Date: 05/12/2022 CLINICAL DATA:  Shortness of breath EXAM: PORTABLE CHEST 1 VIEW COMPARISON:  X-ray 01/10/2022 FINDINGS: Left upper chest defibrillator. Heart enlarged. Calcified and tortuous aorta. Mild vascular congestion which is new from previous. Elevated right hemidiaphragm with a tiny right effusion and adjacent atelectasis. No pneumothorax. Overlapping cardiac leads. Osteopenia. IMPRESSION: Developing vascular congestion.  Tiny right effusion. Enlarged heart with calcified aorta and defibrillator Electronically Signed   By: Karen Kays M.D.   On: 05/12/2022 13:55    ECHO: EF 20-25%  TELEMETRY reviewed by me (LT) 05/20/2022: Predominantly AV paced rhythm with occasional PVCs, without recurrence in VT   Data reviewed by me (LT) 05/20/2022: Hospitalist progress note, palliative care note last 24h vitals tele labs imaging I/O    Principal Problem:   Acute hypoxic respiratory failure (HCC) Active Problems:   Automatic implantable cardioverter-defibrillator in situ   CKD (chronic kidney disease) stage 3, GFR 30-59 ml/min (HCC)   Acute on chronic systolic CHF (congestive heart failure) (HCC)   CAD (coronary artery disease)   PAF (paroxysmal atrial fibrillation) (HCC)    ASSESSMENT AND PLAN:  Spencer Coleman is an 34yoM with a PMH of chronic HFrEF / NICM, hx VT s/p BiV ICD, paroxysmal AF, CKD 3, HTN, HLD who presented to [facility]  ED 05/12/2022 with shortness of breath. Tele strips in the ED noted a wide complex tachycardia that did not terminate with IV adenosine, most c/w VT. BNP elevated to 3000 on admission and CXR with vascular congestion c/w acute on chronic HFrEF.   # acute on chronic HFrEF  # s/p ICD Clinically euvolemic on exam today, weaned to room air. -Defer additional IV diuretics - restart p.o. Lasix 40 mg daily tomorrow morning - Monitor and replenish electrolytes for a goal K > 4 and mag > 2 - Continue GDMT with metoprolol at reduced dosing due to borderline soft blood pressures.  ARB, SGLT2i and MRA limited by BP/CKD.   # recurrent VT  # paroxysmal AF  Known history of VT with recurrence historically in the setting of noncompliance with amiodarone. Had multiple periods of a wide complex tachycardia on tele the past several days, however he is predominantly in a paced rhythm with occasional PVCs on telemetry overnight into this morning. Noted the patient's defibrillator settings will not shock him  out of VT, but will pace him out of VF.  - continue amiodarone to 400mg  BID x 10 days starting 05/17/2022, then 200mg  daily thereafter - continue metoprolol as ordered  - continue eliquis 2.5mg  BID for stroke prevention   # CKD 3  Renal function stable following diuresis. Continue to monitor closely.   Clinically stable for discharge from a cardiac perspective today, dispo plans ongoing with TOC.  Cardiology will sign off, please haiku with questions or reengage if needed.  This patient's plan of care was discussed and created with Dr. Darrold Junker and he is in agreement.  Signed: Rebeca Allegra , PA-C 05/20/2022, 4:29 PM [facility] Cardiology

## 2022-05-20 NOTE — Progress Notes (Signed)
   05/20/22 2100  Assess: MEWS Score  Temp 97.6 F (36.4 C)  BP 98/69  MAP (mmHg) 76  Pulse Rate 70  ECG Heart Rate 70  Resp 15  SpO2 93 %  O2 Device Room Air  Patient Activity (if Appropriate) In bed  Assess: MEWS Score  MEWS Temp 0  MEWS Systolic 1  MEWS Pulse 0  MEWS RR 0  MEWS LOC 0  MEWS Score 1  MEWS Score Color Green  Assess: if the MEWS score is Yellow or Red  Were vital signs taken at a resting state? Yes  Focused Assessment No change from prior assessment  Does the patient meet 2 or more of the SIRS criteria? No  Does the patient have a confirmed or suspected source of infection? Yes  MEWS guidelines implemented  No, vital signs rechecked  Notify: Charge Nurse/RN  Name of Charge Nurse/RN Notified Tiffany, RN  Assess: SIRS CRITERIA  SIRS Temperature  0  SIRS Pulse 0  SIRS Respirations  0  SIRS WBC 0  SIRS Score Sum  0

## 2022-05-20 NOTE — Progress Notes (Signed)
Mobility Specialist - Progress Note   Pre-mobility: SpO2 (100) During mobility: SpO2(74) Post-mobility: SPO2 (98)     05/20/22 1518  Mobility  Activity Ambulated with assistance in hallway  Level of Assistance Contact guard assist, steadying assist  Assistive Device Front wheel walker  Distance Ambulated (ft) 30 ft  Range of Motion/Exercises Active  Activity Response Tolerated well  Mobility Referral Yes  $Mobility charge 1 Mobility  Mobility Specialist Start Time (ACUTE ONLY) 1453  Mobility Specialist Stop Time (ACUTE ONLY) 1518  Mobility Specialist Time Calculation (min) (ACUTE ONLY) 25 min   Pt resting in bed upon entry on RA. Pt STS and ambulates to hallway around NS with AD CGA. Pt given VC to slow walking pace and keep walker close to prevent fall. Pt temporarily desat to 74% on return back to bed. Pt showed no signs of distress or SOB. Pt took seated rest break EOB and performed deep breathing for 4 minutes; pt SpO2 returned to 98%. Pt returned to bed and left with needs in reach and bed alarm activated.   Johnathan Hausen Mobility Specialist 05/20/22, 3:28 PM

## 2022-05-20 NOTE — Progress Notes (Signed)
                                                     Palliative Care Progress Note, Assessment & Plan   Patient Name: Spencer Coleman       Date: 05/20/2022 DOB: 05/06/1930  Age: 89 y.o. MRN#: [id number] Attending Physician: Lurene Shadow, MD Primary Care Physician: Barbette Reichmann, MD Admit Date: 05/12/2022  Subjective: Patient is lying in bed in no apparent distress.  He has just moved with mobility assistance from bathroom back to bed.  Patient is alert and able to acknowledge my presence.  He is able to make his wishes known but remains pleasantly confused about where he is and why he is here.  HPI: 89 y.o. male  with past medical history of PAF, Ventricular Tachycardia s/p BiV ICD (2009), HLD, HTN, dilated cardiomyopathy, chronic systolic heart failure and CKD admitted from home on 05/12/2022 with complaints of shortness of breath. EMS was called by sister as she was concerned about ongoing weakness and possible UTI being managed as outpatient.   On arrival to ED, found to be in wide-complex tachycardia (140's) with soft SBP (80-90's). BNP >3,000 and CXR concerning for vascular congestion. Started on amiodarone infusion, initially admitted to ICU due to concern for needing vasopressor support given hypotension-vasopressors were not initiated    Transitioned to PO amiodarone 5/3, tolerating well   Episodes of confusion, AMS, agitation throughout hospitalization, mentation seems to wax and wane   Palliative medicine was consulted for assisting with goals of care conversations.  Summary of counseling/coordination of care: After reviewing the patient's chart and assessing the patient at bedside, I discussed symptom management with patient.  Patient denies pain or discomfort at this time.  He says he feels well and "is not worried about anything". No adjustment to  medications needed at this time.   After meeting with the patient, I attempted to speak with patient's daughter [name] over the phone.  No answer.  HIPAA compliant voicemail left.  [name] has PMT contact information and has been emailed copy of MOST form and booklet hard choices for loving people.  I plan to follow-up with family on Monday.  Family has PMT contact information in case of acute palliative needs over the weekend.  Physical Exam Vitals reviewed.  Constitutional:      General: He is not in acute distress.    Appearance: He is normal weight. He is not ill-appearing.  HENT:     Head: Normocephalic.  Cardiovascular:     Rate and Rhythm: Normal rate.  Pulmonary:     Effort: Pulmonary effort is normal.  Abdominal:     Palpations: Abdomen is soft.  Musculoskeletal:     Cervical back: Normal range of motion.  Skin:    General: Skin is warm and dry.  Neurological:     Mental Status: He is alert.  Psychiatric:        Mood and Affect: Mood is not anxious.        Behavior: Behavior is not agitated.             Total Time 25 minutes   Atiya Yera L. Manon Hilding, FNP-BC Palliative Medicine Team Team Phone # [phone]

## 2022-05-20 NOTE — Progress Notes (Signed)
Mobility Specialist - Progress Note  During mobility: SpO2 (88) Post-mobility: SPO2(100)     05/20/22 1600  Mobility  Activity Ambulated with assistance to bathroom  Level of Assistance Contact guard assist, steadying assist  Assistive Device Front wheel walker  Distance Ambulated (ft) 10 ft  Range of Motion/Exercises Active  Activity Response Tolerated well  Mobility Referral Yes  $Mobility charge 1 Mobility  Mobility Specialist Start Time (ACUTE ONLY) 1430  Mobility Specialist Stop Time (ACUTE ONLY) 1445  Mobility Specialist Time Calculation (min) (ACUTE ONLY) 15 min   Pt seated in bathroom upon arrival (NT present in room) on RA. Pt STS and ambulates back to bed to take seated rest break before returning to bed.Pt desat to 88% during ambulation. Pt SpO2 returned to 100% after seated rest break. left with needs in reach bed alarm activated.   Johnathan Hausen Mobility Specialist 05/20/22, 4:38 PM

## 2022-05-21 DIAGNOSIS — I959 Hypotension, unspecified: Secondary | ICD-10-CM

## 2022-05-21 DIAGNOSIS — I5023 Acute on chronic systolic (congestive) heart failure: Secondary | ICD-10-CM | POA: Diagnosis not present

## 2022-05-21 DIAGNOSIS — J9601 Acute respiratory failure with hypoxia: Secondary | ICD-10-CM | POA: Diagnosis not present

## 2022-05-21 LAB — CBC
HCT: 41.3 % (ref 39.0–52.0)
Hemoglobin: 13.1 g/dL (ref 13.0–17.0)
MCH: 29.8 pg (ref 26.0–34.0)
MCHC: 31.7 g/dL (ref 30.0–36.0)
MCV: 93.9 fL (ref 80.0–100.0)
Platelets: 128 10*3/uL — ABNORMAL LOW (ref 150–400)
RBC: 4.4 MIL/uL (ref 4.22–5.81)
RDW: 15.2 % (ref 11.5–15.5)
WBC: 6 10*3/uL (ref 4.0–10.5)
nRBC: 0 % (ref 0.0–0.2)

## 2022-05-21 LAB — BASIC METABOLIC PANEL
Anion gap: 6 (ref 5–15)
BUN: 40 mg/dL — ABNORMAL HIGH (ref 8–23)
CO2: 30 mmol/L (ref 22–32)
Calcium: 8.9 mg/dL (ref 8.9–10.3)
Chloride: 100 mmol/L (ref 98–111)
Creatinine, Ser: 1.52 mg/dL — ABNORMAL HIGH (ref 0.61–1.24)
GFR, Estimated: 44 mL/min — ABNORMAL LOW (ref >60)
Glucose, Bld: 93 mg/dL (ref 70–99)
Potassium: 4.2 mmol/L (ref 3.5–5.1)
Sodium: 136 mmol/L (ref 135–145)

## 2022-05-21 LAB — MAGNESIUM: Magnesium: 2.3 mg/dL (ref 1.7–2.4)

## 2022-05-21 NOTE — Progress Notes (Signed)
   05/21/22 0901  Vitals  Temp 97.6 F (36.4 C)  Temp Source Oral  BP (!) 75/64  MAP (mmHg) 70  BP Location Right Arm  BP Method Automatic  Patient Position (if appropriate) Lying  Pulse Rate (!) 115  Pulse Rate Source Monitor  ECG Heart Rate (!) 115  Resp 18  Level of Consciousness  Level of Consciousness Alert  MEWS COLOR  MEWS Score Color Red  Oxygen Therapy  SpO2 96 %  O2 Device Room Air  MEWS Score  MEWS Temp 0  MEWS Systolic 2  MEWS Pulse 2  MEWS RR 0  MEWS LOC 0  MEWS Score 4   Dr Sydnee Cabal made aware, Metoprolol held

## 2022-05-21 NOTE — Progress Notes (Addendum)
Progress Note    Spencer Coleman  WJX:[id number] DOB: 1931-01-07  DOA: 05/12/2022 PCP: Barbette Reichmann, MD      Brief Narrative:    Medical records reviewed and are as summarized below:  Spencer Coleman is a 89 y.o. male PMH of Paroxysmal Atrial Fibrillation, Ventricular Tachycardia s/p BiV ICD (08/09/2007), HLD, HTN, Dilated Cardiomyopathy, Chronic Systolic CHF,  Chronic Kidney Disease, who presented to the hospital with shortness of breath.       Assessment/Plan:   Principal Problem:   Acute hypoxic respiratory failure (HCC) Active Problems:   Acute on chronic systolic CHF (congestive heart failure) (HCC)   CAD (coronary artery disease)   PAF (paroxysmal atrial fibrillation) (HCC)   Automatic implantable cardioverter-defibrillator in situ   CKD (chronic kidney disease) stage 3, GFR 30-59 ml/min (HCC)   Hypotension   Body mass index is 21.1 kg/m.    Acute on chronic systolic CHF, peripheral edema: S/p AICD: He has been started on oral Lasix..  2D echo showed EF estimated at 20 to 25%, grade 2 diastolic dysfunction, moderate LVH.  BNP was 3,155.   Acute hypoxic respiratory failure: Resolved.  Oxygen saturation with ambulation was 96% on room air.   Paroxysmal atrial fibrillation: Continue amiodarone and Eliquis   Hypotension: BP dropped to 75/56 today.  Metoprolol was held this morning.  Losartan has already been discontinued.   Acute UTI: No fever or leukocytosis.  Completed 3 days of cefdinir and cefuroxime on 05/19/2022.  Urine culture did not show any significant growth.   CKD stage IIIa: Creatinine is stable.   Memory impairment, suspected underlying dementia: Code stroke called on 05/16/2022 but CT head did not show any acute abnormality.  Altered mental status was attributed to Ativan at that time.   Thrombocytopenia: Fluctuating but stable platelet count.   History of falls at home: Continue ambulation as able with mobility specialist   I called  [name], daughter, at 12:35 pm today but there was no response.   Diet Order             Diet 2 gram sodium Fluid consistency: Thin  Diet effective now                            Consultants: Cardiologist Palliative care  Procedures: None    Medications:    amiodarone  400 mg Oral BID   Followed by   Melene Muller ON 05/28/2022] amiodarone  200 mg Oral Daily   apixaban  2.5 mg Oral BID   furosemide  40 mg Oral Daily   metoprolol succinate  12.5 mg Oral Daily   QUEtiapine  12.5 mg Oral QHS   simvastatin  40 mg Oral q1800   Continuous Infusions:  sodium chloride       Anti-infectives (From admission, onward)    Start     Dose/Rate Route Frequency Ordered Stop   05/18/22 0800  cefUROXime (CEFTIN) tablet 250 mg        250 mg Oral 2 times daily with meals 05/17/22 1428 05/19/22 1637   05/17/22 1200  cefdinir (OMNICEF) capsule 300 mg  Status:  Discontinued        300 mg Oral Daily 05/17/22 1113 05/17/22 1428              Family Communication/Anticipated D/C date and plan/Code Status   DVT prophylaxis: apixaban (ELIQUIS) tablet 2.5 mg Start: 05/12/22 2200 SCDs Start: 05/12/22 1552 apixaban (ELIQUIS)  tablet 2.5 mg     Code Status: DNR  Family Communication: None Disposition Plan: To be determined   Status is: Inpatient Remains inpatient appropriate because: No safe discharge plan       Subjective:   Interval events noted.  He has no complaints.  No dizziness, shortness of breath or chest pain  Objective:    Vitals:   05/21/22 1000 05/21/22 1038 05/21/22 1051 05/21/22 1100  BP: (!) 85/61 91/65 (!) 75/43 (!) 88/53  Pulse: 91   72  Resp:      Temp:      TempSrc:      SpO2:      Weight:      Height:       No data found.   Intake/Output Summary (Last 24 hours) at 05/21/2022 1238 Last data filed at 05/21/2022 1036 Gross per 24 hour  Intake 360 ml  Output --  Net 360 ml   Filed Weights   05/19/22 0752 05/20/22 0500 05/21/22 0300   Weight: 67 kg 67.3 kg 66.7 kg    Exam:  GEN: NAD SKIN: Warm and dry EYES: EOMI ENT: MMM CV: RRR PULM: CTA B ABD: soft, ND, NT, +BS CNS: AAO x 1 (person), non focal EXT: Bilateral distal leg and pedal edema, no erythema or tenderness     Data Reviewed:   I have personally reviewed following labs and imaging studies:  Labs: Labs show the following:   Basic Metabolic Panel: Recent Labs  Lab 05/17/22 0522 05/18/22 0457 05/19/22 0357 05/20/22 0553 05/21/22 0433  NA 139 139 138 138 136  K 3.9 3.8 3.9 4.5 4.2  CL 105 102 100 101 100  CO2 26 30 30 29 30   GLUCOSE 89 116* 112* 91 93  BUN 37* 35* 38* 41* 40*  CREATININE 1.43* 1.49* 1.39* 1.58* 1.52*  CALCIUM 8.8* 8.6* 8.9 9.0 8.9  MG  --  2.8* 2.4 2.5* 2.3   GFR Estimated Creatinine Clearance: 31.1 mL/min (A) (by C-G formula based on SCr of 1.52 mg/dL (H)). Liver Function Tests: No results for input(s): "AST", "ALT", "ALKPHOS", "BILITOT", "PROT", "ALBUMIN" in the last 168 hours.  No results for input(s): "LIPASE", "AMYLASE" in the last 168 hours. No results for input(s): "AMMONIA" in the last 168 hours. Coagulation profile No results for input(s): "INR", "PROTIME" in the last 168 hours.  CBC: Recent Labs  Lab 05/17/22 0522 05/18/22 0457 05/19/22 0357 05/20/22 0553 05/21/22 0433  WBC 6.2 8.1 6.5 6.4 6.0  HGB 12.9* 13.1 13.4 13.1 13.1  HCT 42.0 41.2 42.0 40.8 41.3  MCV 97.7 93.6 92.7 92.7 93.9  PLT 102* 121* 105* 124* 128*   Cardiac Enzymes: No results for input(s): "CKTOTAL", "CKMB", "CKMBINDEX", "TROPONINI" in the last 168 hours. BNP (last 3 results) No results for input(s): "PROBNP" in the last 8760 hours. CBG: Recent Labs  Lab 05/14/22 2353  GLUCAP 111*   D-Dimer: No results for input(s): "DDIMER" in the last 72 hours. Hgb A1c: No results for input(s): "HGBA1C" in the last 72 hours. Lipid Profile: No results for input(s): "CHOL", "HDL", "LDLCALC", "TRIG", "CHOLHDL", "LDLDIRECT" in the last 72  hours. Thyroid function studies: No results for input(s): "TSH", "T4TOTAL", "T3FREE", "THYROIDAB" in the last 72 hours.  Invalid input(s): "FREET3" Anemia work up: No results for input(s): "VITAMINB12", "FOLATE", "FERRITIN", "TIBC", "IRON", "RETICCTPCT" in the last 72 hours. Sepsis Labs: Recent Labs  Lab 05/18/22 0457 05/19/22 0357 05/20/22 0553 05/21/22 0433  WBC 8.1 6.5 6.4 6.0  Microbiology Recent Results (from the past 240 hour(s))  SARS Coronavirus 2 by RT PCR (hospital order, performed in [facility] hospital lab) *cepheid single result test* Anterior Nasal Swab     Status: None   Collection Time: 05/12/22  3:54 PM   Specimen: Anterior Nasal Swab  Result Value Ref Range Status   SARS Coronavirus 2 by RT PCR NEGATIVE NEGATIVE Final    Comment: (NOTE) SARS-CoV-2 target nucleic acids are NOT DETECTED.  The SARS-CoV-2 RNA is generally detectable in upper and lower respiratory specimens during the acute phase of infection. The lowest concentration of SARS-CoV-2 viral copies this assay can detect is 250 copies / mL. A negative result does not preclude SARS-CoV-2 infection and should not be used as the sole basis for treatment or other patient management decisions.  A negative result may occur with improper specimen collection / handling, submission of specimen other than nasopharyngeal swab, presence of viral mutation(s) within the areas targeted by this assay, and inadequate number of viral copies (<250 copies / mL). A negative result must be combined with clinical observations, patient history, and epidemiological information.  Fact Sheet for Patients:   RoadLapTop.co.za  Fact Sheet for Healthcare Providers: http://kim-miller.com/  This test is not yet approved or  cleared by the [place] FDA and has been authorized for detection and/or diagnosis of SARS-CoV-2 by FDA under an Emergency Use Authorization (EUA).  This  EUA will remain in effect (meaning this test can be used) for the duration of the COVID-19 declaration under Section 564(b)(1) of the Act, 21 U.S.C. section 360bbb-3(b)(1), unless the authorization is terminated or revoked sooner.  Performed at [facility], [address] [place], [place] [address]   MRSA Next Gen by PCR, Nasal     Status: None   Collection Time: 05/12/22  5:12 PM   Specimen: Nasal Mucosa; Nasal Swab  Result Value Ref Range Status   MRSA by PCR Next Gen NOT DETECTED NOT DETECTED Final    Comment: (NOTE) The GeneXpert MRSA Assay (FDA approved for NASAL specimens only), is one component of a comprehensive MRSA colonization surveillance program. It is not intended to diagnose MRSA infection nor to guide or monitor treatment for MRSA infections. Test performance is not FDA approved in patients less than 58 years old. Performed at [facility], [address] [place], [place] [address]   Culture, blood (Routine X 2) w Reflex to ID Panel     Status: None   Collection Time: 05/12/22  5:33 PM   Specimen: BLOOD  Result Value Ref Range Status   Specimen Description BLOOD BLOOD LEFT ARM  Final   Special Requests   Final    BOTTLES DRAWN AEROBIC AND ANAEROBIC Blood Culture adequate volume   Culture   Final    NO GROWTH 5 DAYS Performed at [facility], [address] [place], [place] [address]    Report Status 05/17/2022 FINAL  Final  Culture, blood (Routine X 2) w Reflex to ID Panel     Status: None   Collection Time: 05/12/22  5:33 PM   Specimen: BLOOD  Result Value Ref Range Status   Specimen Description BLOOD BLOOD RIGHT HAND [facility]  Final   Special Requests   Final    BOTTLES DRAWN AEROBIC AND ANAEROBIC Blood Culture results may not be optimal due to an inadequate volume of blood received in culture bottles   Culture   Final    NO GROWTH 5 DAYS Performed at [facility], [address], [place], [place] [address]     Report Status 05/17/2022 FINAL  Final  Urine Culture     Status: Abnormal   Collection Time: 05/12/22  9:38 PM   Specimen: Urine, Clean Catch  Result Value Ref Range Status   Specimen Description   Final    URINE, CLEAN CATCH Performed at [facility], [address] [place], [place] [address]    Special Requests   Final    NONE Performed at [facility], [address] [place], [place] [address]    Culture (A)  Final    <10,000 COLONIES/mL INSIGNIFICANT GROWTH Performed at [facility], [address] [place], [place] [address]    Report Status 05/14/2022 FINAL  Final  Respiratory (~20 pathogens) panel by PCR     Status: None   Collection Time: 05/12/22  9:38 PM   Specimen: Nasopharyngeal Swab; Respiratory  Result Value Ref Range Status   Adenovirus NOT DETECTED NOT DETECTED Final   Coronavirus 229E NOT DETECTED NOT DETECTED Final    Comment: (NOTE) The Coronavirus on the Respiratory Panel, DOES NOT test for the novel  Coronavirus (2019 nCoV)    Coronavirus HKU1 NOT DETECTED NOT DETECTED Final   Coronavirus NL63 NOT DETECTED NOT DETECTED Final   Coronavirus OC43 NOT DETECTED NOT DETECTED Final   Metapneumovirus NOT DETECTED NOT DETECTED Final   Rhinovirus / Enterovirus NOT DETECTED NOT DETECTED Final   Influenza A NOT DETECTED NOT DETECTED Final   Influenza B NOT DETECTED NOT DETECTED Final   Parainfluenza Virus 1 NOT DETECTED NOT DETECTED Final   Parainfluenza Virus 2 NOT DETECTED NOT DETECTED Final   Parainfluenza Virus 3 NOT DETECTED NOT DETECTED Final   Parainfluenza Virus 4 NOT DETECTED NOT DETECTED Final   Respiratory Syncytial Virus NOT DETECTED NOT DETECTED Final   Bordetella pertussis NOT DETECTED NOT DETECTED Final   Bordetella Parapertussis NOT DETECTED NOT DETECTED Final   Chlamydophila pneumoniae NOT DETECTED NOT DETECTED Final   Mycoplasma pneumoniae NOT DETECTED NOT DETECTED Final    Comment: Performed at [facility]  Lab, [address], [place], [place] [address]    Procedures and diagnostic studies:  No results found.             LOS: 9 days   Jeiden Daughtridge  [employer] loss adjuster on [email or web address]. If 7PM-7AM, please contact night-coverage at [email or web address]     05/21/2022, 12:38 PM

## 2022-05-22 DIAGNOSIS — I5023 Acute on chronic systolic (congestive) heart failure: Secondary | ICD-10-CM | POA: Diagnosis not present

## 2022-05-22 DIAGNOSIS — J9601 Acute respiratory failure with hypoxia: Secondary | ICD-10-CM | POA: Diagnosis not present

## 2022-05-22 LAB — BASIC METABOLIC PANEL
Anion gap: 8 (ref 5–15)
BUN: 44 mg/dL — ABNORMAL HIGH (ref 8–23)
CO2: 29 mmol/L (ref 22–32)
Calcium: 8.7 mg/dL — ABNORMAL LOW (ref 8.9–10.3)
Chloride: 99 mmol/L (ref 98–111)
Creatinine, Ser: 1.78 mg/dL — ABNORMAL HIGH (ref 0.61–1.24)
GFR, Estimated: 36 mL/min — ABNORMAL LOW (ref >60)
Glucose, Bld: 101 mg/dL — ABNORMAL HIGH (ref 70–99)
Potassium: 4.2 mmol/L (ref 3.5–5.1)
Sodium: 136 mmol/L (ref 135–145)

## 2022-05-22 LAB — CBC
HCT: 39.2 % (ref 39.0–52.0)
Hemoglobin: 12.4 g/dL — ABNORMAL LOW (ref 13.0–17.0)
MCH: 29.5 pg (ref 26.0–34.0)
MCHC: 31.6 g/dL (ref 30.0–36.0)
MCV: 93.3 fL (ref 80.0–100.0)
Platelets: 126 10*3/uL — ABNORMAL LOW (ref 150–400)
RBC: 4.2 MIL/uL — ABNORMAL LOW (ref 4.22–5.81)
RDW: 15.1 % (ref 11.5–15.5)
WBC: 6.1 10*3/uL (ref 4.0–10.5)
nRBC: 0 % (ref 0.0–0.2)

## 2022-05-22 LAB — MAGNESIUM: Magnesium: 2.2 mg/dL (ref 1.7–2.4)

## 2022-05-22 NOTE — Progress Notes (Signed)
Occupational Therapy Treatment Patient Details Name: Spencer Coleman MRN: [id number] DOB: Dec 20, 1930 Today's Date: 05/22/2022   History of present illness Pt is an 89 y.o. male with history of hypertension, hyperlipidemia, CKD, CHF, AICD, work up for acute on chronic systolic CHF exacerbation.   OT comments  Patient received in bed and agreeable to OT. Pt endorsed needing to use the bathroom and was able to come to EOB with Mod I this date. He required Min A to stand from EOB and Min guard for safety to take steps toward [facility] with RW. Pt with continent void on BSC and completed peri care in sitting with set up-supervision. Pt then stood at sink to complete hand hygiene. Pt requested to return to bed and was left with all needs in reach. Pt is making progress toward goal completion. D/C recommendation remains appropriate. OT will continue to follow acutely.     Recommendations for follow up therapy are one component of a multi-disciplinary discharge planning process, led by the attending physician.  Recommendations may be updated based on patient status, additional functional criteria and insurance authorization.    Assistance Recommended at Discharge Intermittent Supervision/Assistance  Patient can return home with the following  A little help with bathing/dressing/bathroom;A little help with walking and/or transfers;Assistance with cooking/housework;Assist for transportation;Help with stairs or ramp for entrance   Equipment Recommendations  None recommended by OT    Recommendations for Other Services      Precautions / Restrictions Precautions Precautions: Fall Restrictions Weight Bearing Restrictions: No       Mobility Bed Mobility Overal bed mobility: Modified Independent                  Transfers Overall transfer level: Needs assistance Equipment used: Rolling walker (2 wheels) Transfers: Sit to/from Stand Sit to Stand: Min assist           General transfer  comment: STS from EOB and BSC, VC for hand placement     Balance Overall balance assessment: Needs assistance Sitting-balance support: Feet supported Sitting balance-Leahy Scale: Good     Standing balance support: Bilateral upper extremity supported, During functional activity, Single extremity supported Standing balance-Leahy Scale: Fair       ADL either performed or assessed with clinical judgement   ADL Overall ADL's : Needs assistance/impaired     Grooming: Min guard;Standing;Wash/dry Electrical engineer Transfer: BSC/3in1;Rolling walker (2 wheels);Min guard;Minimal assistance;Ambulation Toilet Transfer Details (indicate cue type and reason): short ambulatory transfer Toileting- Clothing Manipulation and Hygiene: Set up;Supervision/safety;Sitting/lateral lean Toileting - Clothing Manipulation Details (indicate cue type and reason): for peri care after continent void on [facility]     Functional mobility during ADLs: Min guard;Rolling walker (2 wheels) (to take several steps ~3 ft from EOB<>BSC then to bedside sink)      Extremity/Trunk Assessment Upper Extremity Assessment Upper Extremity Assessment: Generalized weakness   Lower Extremity Assessment Lower Extremity Assessment: Generalized weakness        Vision Patient Visual Report: No change from baseline     Perception     Praxis      Cognition Arousal/Alertness: Awake/alert Behavior During Therapy: WFL for tasks assessed/performed Overall Cognitive Status: Within Functional Limits for tasks assessed       General Comments: Seems to have intermittent confusion?, told therapist same story about his daughter two times during session, VC required t/o for overall safety awareness with mobility  Exercises      Shoulder Instructions       General Comments      Pertinent Vitals/ Pain       Pain Assessment Pain Assessment: No/denies pain  Home Living        Prior  Functioning/Environment              Frequency  Min 1X/week        Progress Toward Goals  OT Goals(current goals can now be found in the care plan section)  Progress towards OT goals: Progressing toward goals  Acute Rehab OT Goals Patient Stated Goal: go home OT Goal Formulation: With patient Time For Goal Achievement: 05/28/22 Potential to Achieve Goals: Good  Plan Discharge plan remains appropriate;Frequency needs to be updated    Co-evaluation                 AM-PAC OT "6 Clicks" Daily Activity     Outcome Measure   Help from another person eating meals?: None Help from another person taking care of personal grooming?: A Little Help from another person toileting, which includes using toliet, bedpan, or urinal?: A Little Help from another person bathing (including washing, rinsing, drying)?: A Little Help from another person to put on and taking off regular upper body clothing?: None Help from another person to put on and taking off regular lower body clothing?: A Little 6 Click Score: 20    End of Session Equipment Utilized During Treatment: Rolling walker (2 wheels);Gait belt  OT Visit Diagnosis: Unsteadiness on feet (R26.81)   Activity Tolerance Patient tolerated treatment well   Patient Left in bed;with call bell/phone within reach;with bed alarm set   Nurse Communication Mobility status        Time: 6387-5643 OT Time Calculation (min): 16 min  Charges: OT General Charges $OT Visit: 1 Visit OT Treatments $Self Care/Home Management : 8-22 mins  [facility] MS, OTR/L ascom [phone]  05/22/22, 5:10 PM

## 2022-05-22 NOTE — TOC Progression Note (Signed)
Transition of Care [facility]) - Progression Note    Patient Details  Name: Spencer Coleman MRN: [id number] Date of Birth: 1930-12-07  Transition of Care [facility]) CM/SW Contact  Kemper Durie, RN Phone Number: 05/22/2022, 12:25 PM  Clinical Narrative:      Call placed to daughter to discuss discharge planning, unavailable, voice message left to return call back.      Expected Discharge Plan and Services                                               Social Determinants of Health (SDOH) Interventions SDOH Screenings   Food Insecurity: No Food Insecurity (05/16/2022)  Housing: Low Risk  (05/16/2022)  Transportation Needs: No Transportation Needs (05/16/2022)  Utilities: Not At Risk (05/16/2022)  Tobacco Use: Low Risk  (05/18/2022)    Readmission Risk Interventions     No data to display

## 2022-05-22 NOTE — Progress Notes (Signed)
Progress Note    Spencer Coleman  WUX:[id number] DOB: 13-Sep-1930  DOA: 05/12/2022 PCP: Barbette Reichmann, MD      Brief Narrative:    Medical records reviewed and are as summarized below:  Spencer Coleman is a 89 y.o. male PMH of Paroxysmal Atrial Fibrillation, Ventricular Tachycardia s/p BiV ICD (08/09/2007), HLD, HTN, Dilated Cardiomyopathy, Chronic Systolic CHF,  Chronic Kidney Disease, who presented to the hospital with shortness of breath.       Assessment/Plan:   Principal Problem:   Acute hypoxic respiratory failure (HCC) Active Problems:   Acute on chronic systolic CHF (congestive heart failure) (HCC)   CAD (coronary artery disease)   PAF (paroxysmal atrial fibrillation) (HCC)   Automatic implantable cardioverter-defibrillator in situ   CKD (chronic kidney disease) stage 3, GFR 30-59 ml/min (HCC)   Hypotension   Body mass index is 21.04 kg/m.    Acute on chronic systolic CHF, peripheral edema: S/p AICD: Continue p.o. Lasix.  2D echo showed EF estimated at 20 to 25%, grade 2 diastolic dysfunction, moderate LVH.  BNP was 3,155.   Acute hypoxic respiratory failure: Resolved.  Oxygen saturation with ambulation was 96% on room air.   Paroxysmal atrial fibrillation: Continue metoprolol, amiodarone and Eliquis   Hypotension: BP is better.  Losartan on hold.   Acute UTI: No fever or leukocytosis.  Completed 3 days of cefdinir and cefuroxime on 05/19/2022.  Urine culture did not show any significant growth.   CKD stage IIIa: Creatinine is stable.   Memory impairment, suspected underlying dementia: Code stroke called on 05/16/2022 but CT head did not show any acute abnormality.  Altered mental status was attributed to Ativan at that time.   Thrombocytopenia: Fluctuating but stable platelet count.   History of falls at home: Continue ambulation as able with mobility specialist     Diet Order             Diet 2 gram sodium Fluid consistency: Thin  Diet  effective now                            Consultants: Cardiologist Palliative care  Procedures: None    Medications:    amiodarone  400 mg Oral BID   Followed by   Melene Muller ON 05/28/2022] amiodarone  200 mg Oral Daily   apixaban  2.5 mg Oral BID   furosemide  40 mg Oral Daily   metoprolol succinate  12.5 mg Oral Daily   QUEtiapine  12.5 mg Oral QHS   simvastatin  40 mg Oral q1800   Continuous Infusions:  sodium chloride       Anti-infectives (From admission, onward)    Start     Dose/Rate Route Frequency Ordered Stop   05/18/22 0800  cefUROXime (CEFTIN) tablet 250 mg        250 mg Oral 2 times daily with meals 05/17/22 1428 05/19/22 1637   05/17/22 1200  cefdinir (OMNICEF) capsule 300 mg  Status:  Discontinued        300 mg Oral Daily 05/17/22 1113 05/17/22 1428              Family Communication/Anticipated D/C date and plan/Code Status   DVT prophylaxis: apixaban (ELIQUIS) tablet 2.5 mg Start: 05/12/22 2200 SCDs Start: 05/12/22 1552 apixaban (ELIQUIS) tablet 2.5 mg     Code Status: DNR  Family Communication: None Disposition Plan: To be determined   Status is: Inpatient Remains inpatient  appropriate because: No safe discharge plan       Subjective:   Interval events noted.  He has no complaints.  No dizziness, shortness of breath or chest pain  Objective:    Vitals:   05/22/22 0000 05/22/22 0400 05/22/22 0757 05/22/22 0858  BP: 109/78 118/63 111/72 99/86  Pulse:  69 84   Resp: 19 18    Temp:  97.7 F (36.5 C) 97.9 F (36.6 C)   TempSrc:  Oral Oral   SpO2:  97% 97%   Weight:  66.5 kg    Height:       No data found.   Intake/Output Summary (Last 24 hours) at 05/22/2022 1152 Last data filed at 05/22/2022 1046 Gross per 24 hour  Intake 580 ml  Output --  Net 580 ml   Filed Weights   05/20/22 0500 05/21/22 0300 05/22/22 0400  Weight: 67.3 kg 66.7 kg 66.5 kg    Exam:  GEN: NAD SKIN: Warm and dry EYES: No  pallor or icterus ENT: MMM CV: RRR PULM: CTA B ABD: soft, ND, NT, +BS CNS: AAO x 1 (person), non focal EXT: Bilateral leg and pedal edema, no tenderness   Data Reviewed:   I have personally reviewed following labs and imaging studies:  Labs: Labs show the following:   Basic Metabolic Panel: Recent Labs  Lab 05/18/22 0457 05/19/22 0357 05/20/22 0553 05/21/22 0433 05/22/22 0452  NA 139 138 138 136 136  K 3.8 3.9 4.5 4.2 4.2  CL 102 100 101 100 99  CO2 30 30 29 30 29   GLUCOSE 116* 112* 91 93 101*  BUN 35* 38* 41* 40* 44*  CREATININE 1.49* 1.39* 1.58* 1.52* 1.78*  CALCIUM 8.6* 8.9 9.0 8.9 8.7*  MG 2.8* 2.4 2.5* 2.3 2.2   GFR Estimated Creatinine Clearance: 26.5 mL/min (A) (by C-G formula based on SCr of 1.78 mg/dL (H)). Liver Function Tests: No results for input(s): "AST", "ALT", "ALKPHOS", "BILITOT", "PROT", "ALBUMIN" in the last 168 hours.  No results for input(s): "LIPASE", "AMYLASE" in the last 168 hours. No results for input(s): "AMMONIA" in the last 168 hours. Coagulation profile No results for input(s): "INR", "PROTIME" in the last 168 hours.  CBC: Recent Labs  Lab 05/18/22 0457 05/19/22 0357 05/20/22 0553 05/21/22 0433 05/22/22 0452  WBC 8.1 6.5 6.4 6.0 6.1  HGB 13.1 13.4 13.1 13.1 12.4*  HCT 41.2 42.0 40.8 41.3 39.2  MCV 93.6 92.7 92.7 93.9 93.3  PLT 121* 105* 124* 128* 126*   Cardiac Enzymes: No results for input(s): "CKTOTAL", "CKMB", "CKMBINDEX", "TROPONINI" in the last 168 hours. BNP (last 3 results) No results for input(s): "PROBNP" in the last 8760 hours. CBG: No results for input(s): "GLUCAP" in the last 168 hours.  D-Dimer: No results for input(s): "DDIMER" in the last 72 hours. Hgb A1c: No results for input(s): "HGBA1C" in the last 72 hours. Lipid Profile: No results for input(s): "CHOL", "HDL", "LDLCALC", "TRIG", "CHOLHDL", "LDLDIRECT" in the last 72 hours. Thyroid function studies: No results for input(s): "TSH", "T4TOTAL",  "T3FREE", "THYROIDAB" in the last 72 hours.  Invalid input(s): "FREET3" Anemia work up: No results for input(s): "VITAMINB12", "FOLATE", "FERRITIN", "TIBC", "IRON", "RETICCTPCT" in the last 72 hours. Sepsis Labs: Recent Labs  Lab 05/19/22 0357 05/20/22 0553 05/21/22 0433 05/22/22 0452  WBC 6.5 6.4 6.0 6.1    Microbiology Recent Results (from the past 240 hour(s))  SARS Coronavirus 2 by RT PCR (hospital order, performed in [facility] hospital lab) *cepheid single result  test* Anterior Nasal Swab     Status: None   Collection Time: 05/12/22  3:54 PM   Specimen: Anterior Nasal Swab  Result Value Ref Range Status   SARS Coronavirus 2 by RT PCR NEGATIVE NEGATIVE Final    Comment: (NOTE) SARS-CoV-2 target nucleic acids are NOT DETECTED.  The SARS-CoV-2 RNA is generally detectable in upper and lower respiratory specimens during the acute phase of infection. The lowest concentration of SARS-CoV-2 viral copies this assay can detect is 250 copies / mL. A negative result does not preclude SARS-CoV-2 infection and should not be used as the sole basis for treatment or other patient management decisions.  A negative result may occur with improper specimen collection / handling, submission of specimen other than nasopharyngeal swab, presence of viral mutation(s) within the areas targeted by this assay, and inadequate number of viral copies (<250 copies / mL). A negative result must be combined with clinical observations, patient history, and epidemiological information.  Fact Sheet for Patients:   RoadLapTop.co.za  Fact Sheet for Healthcare Providers: http://kim-miller.com/  This test is not yet approved or  cleared by the [place] FDA and has been authorized for detection and/or diagnosis of SARS-CoV-2 by FDA under an Emergency Use Authorization (EUA).  This EUA will remain in effect (meaning this test can be used) for the duration of  the COVID-19 declaration under Section 564(b)(1) of the Act, 21 U.S.C. section 360bbb-3(b)(1), unless the authorization is terminated or revoked sooner.  Performed at [facility], [address] [place], [place] [address]   MRSA Next Gen by PCR, Nasal     Status: None   Collection Time: 05/12/22  5:12 PM   Specimen: Nasal Mucosa; Nasal Swab  Result Value Ref Range Status   MRSA by PCR Next Gen NOT DETECTED NOT DETECTED Final    Comment: (NOTE) The GeneXpert MRSA Assay (FDA approved for NASAL specimens only), is one component of a comprehensive MRSA colonization surveillance program. It is not intended to diagnose MRSA infection nor to guide or monitor treatment for MRSA infections. Test performance is not FDA approved in patients less than 65 years old. Performed at [facility], [address] [place], [place] [address]   Culture, blood (Routine X 2) w Reflex to ID Panel     Status: None   Collection Time: 05/12/22  5:33 PM   Specimen: BLOOD  Result Value Ref Range Status   Specimen Description BLOOD BLOOD LEFT ARM  Final   Special Requests   Final    BOTTLES DRAWN AEROBIC AND ANAEROBIC Blood Culture adequate volume   Culture   Final    NO GROWTH 5 DAYS Performed at [facility], [address] [place], [place] [address]    Report Status 05/17/2022 FINAL  Final  Culture, blood (Routine X 2) w Reflex to ID Panel     Status: None   Collection Time: 05/12/22  5:33 PM   Specimen: BLOOD  Result Value Ref Range Status   Specimen Description BLOOD BLOOD RIGHT HAND [facility]  Final   Special Requests   Final    BOTTLES DRAWN AEROBIC AND ANAEROBIC Blood Culture results may not be optimal due to an inadequate volume of blood received in culture bottles   Culture   Final    NO GROWTH 5 DAYS Performed at [facility], [address], [place], [place] [address]    Report Status 05/17/2022 FINAL  Final  Urine Culture     Status:  Abnormal    Collection Time: 05/12/22  9:38 PM   Specimen: Urine, Clean Catch  Result Value Ref Range Status   Specimen Description   Final    URINE, CLEAN CATCH Performed at [facility], [address], [place], [place] [address]    Special Requests   Final    NONE Performed at [facility], [address] [place], [place] [address]    Culture (A)  Final    <10,000 COLONIES/mL INSIGNIFICANT GROWTH Performed at [facility], [address] [place], [place] [address]    Report Status 05/14/2022 FINAL  Final  Respiratory (~20 pathogens) panel by PCR     Status: None   Collection Time: 05/12/22  9:38 PM   Specimen: Nasopharyngeal Swab; Respiratory  Result Value Ref Range Status   Adenovirus NOT DETECTED NOT DETECTED Final   Coronavirus 229E NOT DETECTED NOT DETECTED Final    Comment: (NOTE) The Coronavirus on the Respiratory Panel, DOES NOT test for the novel  Coronavirus (2019 nCoV)    Coronavirus HKU1 NOT DETECTED NOT DETECTED Final   Coronavirus NL63 NOT DETECTED NOT DETECTED Final   Coronavirus OC43 NOT DETECTED NOT DETECTED Final   Metapneumovirus NOT DETECTED NOT DETECTED Final   Rhinovirus / Enterovirus NOT DETECTED NOT DETECTED Final   Influenza A NOT DETECTED NOT DETECTED Final   Influenza B NOT DETECTED NOT DETECTED Final   Parainfluenza Virus 1 NOT DETECTED NOT DETECTED Final   Parainfluenza Virus 2 NOT DETECTED NOT DETECTED Final   Parainfluenza Virus 3 NOT DETECTED NOT DETECTED Final   Parainfluenza Virus 4 NOT DETECTED NOT DETECTED Final   Respiratory Syncytial Virus NOT DETECTED NOT DETECTED Final   Bordetella pertussis NOT DETECTED NOT DETECTED Final   Bordetella Parapertussis NOT DETECTED NOT DETECTED Final   Chlamydophila pneumoniae NOT DETECTED NOT DETECTED Final   Mycoplasma pneumoniae NOT DETECTED NOT DETECTED Final    Comment: Performed at [facility], [address] [place], [place] [address]    Procedures and diagnostic  studies:  No results found.             LOS: 10 days   Sherrod Toothman  [facility]   Pager on [email or web address]. If 7PM-7AM, please contact night-coverage at [email or web address]     05/22/2022, 11:52 AM

## 2022-05-23 DIAGNOSIS — I509 Heart failure, unspecified: Secondary | ICD-10-CM | POA: Diagnosis not present

## 2022-05-23 DIAGNOSIS — J9601 Acute respiratory failure with hypoxia: Secondary | ICD-10-CM | POA: Diagnosis not present

## 2022-05-23 DIAGNOSIS — Z95810 Presence of automatic (implantable) cardiac defibrillator: Secondary | ICD-10-CM | POA: Diagnosis not present

## 2022-05-23 DIAGNOSIS — I5023 Acute on chronic systolic (congestive) heart failure: Secondary | ICD-10-CM | POA: Diagnosis not present

## 2022-05-23 DIAGNOSIS — R000 Tachycardia, unspecified: Secondary | ICD-10-CM | POA: Diagnosis not present

## 2022-05-23 LAB — CBC
HCT: 40.9 % (ref 39.0–52.0)
Hemoglobin: 12.7 g/dL — ABNORMAL LOW (ref 13.0–17.0)
MCH: 29.2 pg (ref 26.0–34.0)
MCHC: 31.1 g/dL (ref 30.0–36.0)
MCV: 94 fL (ref 80.0–100.0)
Platelets: 126 10*3/uL — ABNORMAL LOW (ref 150–400)
RBC: 4.35 MIL/uL (ref 4.22–5.81)
RDW: 14.8 % (ref 11.5–15.5)
WBC: 5.9 10*3/uL (ref 4.0–10.5)
nRBC: 0 % (ref 0.0–0.2)

## 2022-05-23 LAB — BASIC METABOLIC PANEL
Anion gap: 9 (ref 5–15)
BUN: 42 mg/dL — ABNORMAL HIGH (ref 8–23)
CO2: 31 mmol/L (ref 22–32)
Calcium: 8.8 mg/dL — ABNORMAL LOW (ref 8.9–10.3)
Chloride: 99 mmol/L (ref 98–111)
Creatinine, Ser: 1.58 mg/dL — ABNORMAL HIGH (ref 0.61–1.24)
GFR, Estimated: 42 mL/min — ABNORMAL LOW (ref >60)
Glucose, Bld: 101 mg/dL — ABNORMAL HIGH (ref 70–99)
Potassium: 4.1 mmol/L (ref 3.5–5.1)
Sodium: 139 mmol/L (ref 135–145)

## 2022-05-23 LAB — MAGNESIUM: Magnesium: 2.4 mg/dL (ref 1.7–2.4)

## 2022-05-23 MED ORDER — ORAL CARE MOUTH RINSE: 15.0000 mL | OROMUCOSAL | Status: DC | PRN

## 2022-05-23 NOTE — Progress Notes (Signed)
Progress Note    Spencer Coleman  ZOX:[id number] DOB: [birth year]  DOA: 05/12/2022 PCP: Barbette Reichmann, MD      Brief Narrative:    Medical records reviewed and are as summarized below:  Spencer Coleman is a 89 y.o. male PMH of Paroxysmal Atrial Fibrillation, Ventricular Tachycardia s/p BiV ICD (08/09/2007), HLD, HTN, Dilated Cardiomyopathy, Chronic Systolic CHF,  Chronic Kidney Disease, who presented to the hospital with shortness of breath.       Assessment/Plan:   Principal Problem:   Acute hypoxic respiratory failure (HCC) Active Problems:   Acute on chronic systolic CHF (congestive heart failure) (HCC)   CAD (coronary artery disease)   PAF (paroxysmal atrial fibrillation) (HCC)   Automatic implantable cardioverter-defibrillator in situ   CKD (chronic kidney disease) stage 3, GFR 30-59 ml/min (HCC)   Hypotension   Body mass index is 20.66 kg/m.    Acute on chronic systolic CHF, peripheral edema: S/p AICD: Continue Lasix.  2D echo showed EF estimated at 20 to 25%, grade 2 diastolic dysfunction, moderate LVH.  BNP was 3,155.   Acute hypoxic respiratory failure: Resolved.  Oxygen saturation with ambulation was 96% on room air.   Paroxysmal atrial fibrillation: Continue metoprolol, amiodarone and Eliquis   Hypotension: Overall, BP has improved..  Losartan on hold.   Acute UTI: No fever or leukocytosis.  Completed 3 days of cefdinir and cefuroxime on 05/19/2022.  Urine culture did not show any significant growth.   CKD stage IIIa: Creatinine is stable.   Memory impairment, suspected underlying dementia: Code stroke called on 05/16/2022 but CT head did not show any acute abnormality.  Altered mental status was attributed to Ativan at that time.   Thrombocytopenia: Platelet count is stable.   History of falls at home: Continue ambulation as able with mobility specialist   No safe discharge plan at this time.    Diet Order             Diet 2 gram sodium  Room service appropriate? No; Fluid consistency: Thin  Diet effective now                            Consultants: Cardiologist Palliative care  Procedures: None    Medications:    amiodarone  400 mg Oral BID   Followed by   Melene Muller ON 05/28/2022] amiodarone  200 mg Oral Daily   apixaban  2.5 mg Oral BID   furosemide  40 mg Oral Daily   metoprolol succinate  12.5 mg Oral Daily   QUEtiapine  12.5 mg Oral QHS   simvastatin  40 mg Oral q1800   Continuous Infusions:  sodium chloride       Anti-infectives (From admission, onward)    Start     Dose/Rate Route Frequency Ordered Stop   05/18/22 0800  cefUROXime (CEFTIN) tablet 250 mg        250 mg Oral 2 times daily with meals 05/17/22 1428 05/19/22 1637   05/17/22 1200  cefdinir (OMNICEF) capsule 300 mg  Status:  Discontinued        300 mg Oral Daily 05/17/22 1113 05/17/22 1428              Family Communication/Anticipated D/C date and plan/Code Status   DVT prophylaxis: apixaban (ELIQUIS) tablet 2.5 mg Start: 05/12/22 2200 SCDs Start: 05/12/22 1552 apixaban (ELIQUIS) tablet 2.5 mg     Code Status: DNR  Family Communication: None Disposition  Plan: To be determined   Status is: Inpatient Remains inpatient appropriate because: No safe discharge plan       Subjective:   No complaints.  No dizziness, chest pain or shortness of breath.  Sitter at the bedside.  Objective:    Vitals:   05/23/22 0400 05/23/22 0500 05/23/22 0739 05/23/22 1144  BP: 105/64  110/74 109/68  Pulse: 70  72 71  Resp: 18  18 12   Temp: 98 F (36.7 C)  98.2 F (36.8 C) 98 F (36.7 C)  TempSrc: Oral  Oral Oral  SpO2: 96%  97% 98%  Weight:  65.3 kg    Height:       No data found.   Intake/Output Summary (Last 24 hours) at 05/23/2022 1443 Last data filed at 05/23/2022 1338 Gross per 24 hour  Intake 1060 ml  Output 400 ml  Net 660 ml   Filed Weights   05/21/22 0300 05/22/22 0400 05/23/22 0500  Weight:  66.7 kg 66.5 kg 65.3 kg    Exam:  GEN: NAD SKIN: Warm and dry EYES: Anicteric ENT: MMM CV: RRR PULM: CTA B ABD: soft, ND, NT, +BS CNS: AAO x 1 (person), non focal EXT: B/l leg edema, no tenderness     Data Reviewed:   I have personally reviewed following labs and imaging studies:  Labs: Labs show the following:   Basic Metabolic Panel: Recent Labs  Lab 05/19/22 0357 05/20/22 0553 05/21/22 0433 05/22/22 0452 05/23/22 0553  NA 138 138 136 136 139  K 3.9 4.5 4.2 4.2 4.1  CL 100 101 100 99 99  CO2 30 29 30 29 31   GLUCOSE 112* 91 93 101* 101*  BUN 38* 41* 40* 44* 42*  CREATININE 1.39* 1.58* 1.52* 1.78* 1.58*  CALCIUM 8.9 9.0 8.9 8.7* 8.8*  MG 2.4 2.5* 2.3 2.2 2.4   GFR Estimated Creatinine Clearance: 29.3 mL/min (A) (by C-G formula based on SCr of 1.58 mg/dL (H)). Liver Function Tests: No results for input(s): "AST", "ALT", "ALKPHOS", "BILITOT", "PROT", "ALBUMIN" in the last 168 hours.  No results for input(s): "LIPASE", "AMYLASE" in the last 168 hours. No results for input(s): "AMMONIA" in the last 168 hours. Coagulation profile No results for input(s): "INR", "PROTIME" in the last 168 hours.  CBC: Recent Labs  Lab 05/19/22 0357 05/20/22 0553 05/21/22 0433 05/22/22 0452 05/23/22 0553  WBC 6.5 6.4 6.0 6.1 5.9  HGB 13.4 13.1 13.1 12.4* 12.7*  HCT 42.0 40.8 41.3 39.2 40.9  MCV 92.7 92.7 93.9 93.3 94.0  PLT 105* 124* 128* 126* 126*   Cardiac Enzymes: No results for input(s): "CKTOTAL", "CKMB", "CKMBINDEX", "TROPONINI" in the last 168 hours. BNP (last 3 results) No results for input(s): "PROBNP" in the last 8760 hours. CBG: No results for input(s): "GLUCAP" in the last 168 hours.  D-Dimer: No results for input(s): "DDIMER" in the last 72 hours. Hgb A1c: No results for input(s): "HGBA1C" in the last 72 hours. Lipid Profile: No results for input(s): "CHOL", "HDL", "LDLCALC", "TRIG", "CHOLHDL", "LDLDIRECT" in the last 72 hours. Thyroid function  studies: No results for input(s): "TSH", "T4TOTAL", "T3FREE", "THYROIDAB" in the last 72 hours.  Invalid input(s): "FREET3" Anemia work up: No results for input(s): "VITAMINB12", "FOLATE", "FERRITIN", "TIBC", "IRON", "RETICCTPCT" in the last 72 hours. Sepsis Labs: Recent Labs  Lab 05/20/22 0553 05/21/22 0433 05/22/22 0452 05/23/22 0553  WBC 6.4 6.0 6.1 5.9    Microbiology No results found for this or any previous visit (from the past 240 hour(s)).  Procedures and diagnostic studies:  No results found.             LOS: 11 days   Lauralee Waters  [facility] [employer] loss adjuster on [email or web address]. If 7PM-7AM, please contact night-coverage at [email or web address]     05/23/2022, 2:43 PM

## 2022-05-23 NOTE — Progress Notes (Signed)
Physical Therapy Treatment Patient Details Name: Spencer Coleman MRN: [id number] DOB: 03-10-1930 Today's Date: 05/23/2022   History of Present Illness Pt is an 89 y.o. male with history of hypertension, hyperlipidemia, CKD, CHF, AICD, work up for acute on chronic systolic CHF exacerbation.    PT Comments    Pt was pleasant and motivated to participate during the session and put forth good effort throughout. Pt required extra time and effort along with cuing for functional tasks but required no physical assistance during the session.  Pt was able to amb 150' with a RW with slow cadence but with no overt LOB with SpO2 and HR WNL on room air. HR was in the 70s at rest and increased to a max of low 90s with ambulation, no adverse symptoms reported by the patient. Pt will benefit from continued PT services upon discharge to safely address deficits listed in patient problem list for decreased caregiver assistance and eventual return to PLOF.     Recommendations for follow up therapy are one component of a multi-disciplinary discharge planning process, led by the attending physician.  Recommendations may be updated based on patient status, additional functional criteria and insurance authorization.  Follow Up Recommendations       Assistance Recommended at Discharge Frequent or constant Supervision/Assistance  Patient can return home with the following Assist for transportation;Direct supervision/assist for medications management;A little help with walking and/or transfers;A little help with bathing/dressing/bathroom;Assistance with cooking/housework   Equipment Recommendations  None recommended by PT    Recommendations for Other Services       Precautions / Restrictions Precautions Precautions: Fall Restrictions Weight Bearing Restrictions: No     Mobility  Bed Mobility Overal bed mobility: Modified Independent             General bed mobility comments: Min extra time and effort  only    Transfers Overall transfer level: Needs assistance Equipment used: Rolling walker (2 wheels) Transfers: Sit to/from Stand Sit to Stand: Min assist           General transfer comment: Min A with attempt to stand with no cuing; CGA with standing with cuing for hand placement and increased trunk flexion    Ambulation/Gait Ambulation/Gait assistance: Min guard Gait Distance (Feet): 150 Feet Assistive device: Rolling walker (2 wheels) Gait Pattern/deviations: Step-through pattern, Decreased step length - right, Decreased step length - left Gait velocity: decreased     General Gait Details: Slow cadence with frequent cues for amb closer to the RW with upright posture but steady without LOB   Stairs             Wheelchair Mobility    Modified Rankin (Stroke Patients Only)       Balance Overall balance assessment: Needs assistance Sitting-balance support: Feet supported Sitting balance-Leahy Scale: Good     Standing balance support: Bilateral upper extremity supported, During functional activity, Reliant on assistive device for balance Standing balance-Leahy Scale: Fair                              Cognition Arousal/Alertness: Awake/alert Behavior During Therapy: WFL for tasks assessed/performed Overall Cognitive Status: Within Functional Limits for tasks assessed                                          Exercises Total Joint Exercises Long  Arc Quad: AROM, Both, 10 reps, Strengthening Knee Flexion: Strengthening, Both, 10 reps, AROM    General Comments        Pertinent Vitals/Pain Pain Assessment Pain Assessment: No/denies pain    Home Living                          Prior Function            PT Goals (current goals can now be found in the care plan section) Progress towards PT goals: Progressing toward goals    Frequency    Min 2X/week      PT Plan Current plan remains appropriate     Co-evaluation              AM-PAC PT "6 Clicks" Mobility   Outcome Measure  Help needed turning from your back to your side while in a flat bed without using bedrails?: None Help needed moving from lying on your back to sitting on the side of a flat bed without using bedrails?: None Help needed moving to and from a bed to a chair (including a wheelchair)?: A Little Help needed standing up from a chair using your arms (e.g., wheelchair or bedside chair)?: A Little Help needed to walk in hospital room?: A Little Help needed climbing 3-5 steps with a railing? : A Little 6 Click Score: 20    End of Session Equipment Utilized During Treatment: Gait belt Activity Tolerance: Patient tolerated treatment well Patient left: with call bell/phone within reach;with chair alarm set;in chair Nurse Communication: Mobility status PT Visit Diagnosis: Other abnormalities of gait and mobility (R26.89);Muscle weakness (generalized) (M62.81)     Time: 4098-1191 PT Time Calculation (min) (ACUTE ONLY): 22 min  Charges:  $Gait Training: 8-22 mins                     D. Scott Cheryl Chay PT, DPT 05/23/22, 4:04 PM

## 2022-05-23 NOTE — Progress Notes (Signed)
Palliative Care Progress Note, Assessment & Plan   Patient Name: Spencer Coleman       Date: 05/23/2022 DOB: 03-22-1930  Age: 89 y.o. MRN#: [id number] Attending Physician: Spencer Shadow, MD Primary Care Physician: Spencer Reichmann, MD Admit Date: 05/12/2022  Subjective: Patient is sitting up in bed in no apparent distress.  He acknowledges my presence and is able to make his wishes known.  Safety sitter is at bedside.  No family or friends present during my visit.  HPI: 89 y.o. male  with past medical history of PAF, Ventricular Tachycardia s/p BiV ICD (2009), HLD, HTN, dilated cardiomyopathy, chronic systolic heart failure and CKD admitted from home on 05/12/2022 with complaints of shortness of breath. EMS was called by sister as she was concerned about ongoing weakness and possible UTI being managed as outpatient.   On arrival to ED, found to be in wide-complex tachycardia (140's) with soft SBP (80-90's). BNP >3,000 and CXR concerning for vascular congestion. Started on amiodarone infusion, initially admitted to ICU due to concern for needing vasopressor support given hypotension-vasopressors were not initiated    Transitioned to PO amiodarone 5/3, tolerating well   Episodes of confusion, AMS, agitation throughout hospitalization, mentation seems to wax and wane   Palliative medicine was consulted for assisting with goals of care conversations.  Summary of counseling/coordination of care: After reviewing the patient's chart and assessing the patient at bedside, I spoke with patient in regards to symptom management.  Patient alert and oriented to self but unable to participate in goals of care medical decision making independently at this time.  Patient was able to endorse he has no acute complaints and is  "worried about nothing".  No adjustments to medications needed at this time.  Symptom burden remains low.  After assessing the patient, I spoke with patient's daughter Spencer Coleman over the phone.  We discussed goals and plan of care for patient.    Therapeutic silence and active listening provided for Spencer Coleman to share her thoughts and emotions regarding her father's current medical situation.  Spencer Coleman shares this has all been "dropped in my lap" over the last week and she is attempting to "sort this all out".    She shares she would like to have access to his MyChart.  Helpdesk number for MyChart given for Spencer Coleman to investigate healthcare proxy access.  I attempted to elicit goals and values important to the patient and Spencer Coleman. Spencer Coleman has MOST form for review/completion both in digital form and paper copy at bedside for review/completion. Spencer Coleman shares she is most concerned right now about getting appropriate insurance coverage for patient.  She inquires about whether patient has a formal diagnosis of dementia.  As per chart review, patient does not have a formal diagnosis of dementia but does have memory impairment.  Spencer Coleman shares this will be helpful in helping guide what services patient can receive. I assured her TOC following closely. I encouraged her to reach out to [facility] for discharge planning.  She also has PMT contact information and was encouraged to contact PMT with any acute palliative needs and if/when she is ready to further outline patient's goals/wishes.   PMT will continue to follow.  Physical Exam  Vitals reviewed.  Constitutional:      General: He is not in acute distress.    Appearance: He is normal weight.  HENT:     Head: Normocephalic.  Cardiovascular:     Rate and Rhythm: Normal rate.  Pulmonary:     Effort: Pulmonary effort is normal.  Musculoskeletal:     Cervical back: Normal range of motion.  Skin:    General: Skin is warm and dry.  Neurological:     Mental Status: He  is alert.     Comments: Oriented to self and place  Psychiatric:        Mood and Affect: Mood is not anxious.        Behavior: Behavior is not agitated.             Total Time 25 minutes   Spencer Foss L. Manon Hilding, FNP-BC Palliative Medicine Team Team Phone # [phone]

## 2022-05-24 DIAGNOSIS — I480 Paroxysmal atrial fibrillation: Secondary | ICD-10-CM | POA: Diagnosis not present

## 2022-05-24 DIAGNOSIS — I5023 Acute on chronic systolic (congestive) heart failure: Secondary | ICD-10-CM | POA: Diagnosis not present

## 2022-05-24 NOTE — Progress Notes (Addendum)
Mobility Specialist - Progress Note   05/24/22 1600  Mobility  Activity Ambulated with assistance in hallway;Transferred from chair to bed  Level of Assistance Standby assist, set-up cues, supervision of patient - no hands on  Assistive Device Front wheel walker  Distance Ambulated (ft) 240 ft  Activity Response Tolerated well  $Mobility charge 1 Mobility  Mobility Specialist Start Time (ACUTE ONLY) 1550  Mobility Specialist Stop Time (ACUTE ONLY) 1610  Mobility Specialist Time Calculation (min) (ACUTE ONLY) 20 min     Pt sitting in recliner upon arrival, utilizing RA. Motivated for ambulation. Pt STS with modA d/t pt awkwardly positioned in recliner; very impulsive. Poor safety awareness. Pt ambulated in hallway with close minG. Heavy VC to keep RW close to body and on the ground during turns. Pt returned to bed with alarm set, needs in reach.     Spencer Coleman Mobility Specialist 05/24/22, 4:14 PM

## 2022-05-24 NOTE — Care Management Important Message (Signed)
Important Message  Patient Details  Name: Spencer Coleman MRN: [id number] Date of Birth: January 09, 1931   Medicare Important Message Given:  Yes     Johnell Comings 05/24/2022, 11:34 AM

## 2022-05-24 NOTE — Progress Notes (Signed)
Occupational Therapy Treatment Patient Details Name: Spencer Coleman MRN: [id number] DOB: December 01, 1930 Today's Date: 05/24/2022   History of present illness Pt is an 89 y.o. male with history of hypertension, hyperlipidemia, CKD, CHF, AICD, work up for acute on chronic systolic CHF exacerbation.   OT comments  Pt seen for OT tx. Pt pleasant, follows cues well this date. Pt CGA for STS with RW and ambulates short distance in room with RW to [facility]. Pt required set up for seated grooming tasks, set up and CGA for standing pericare, and MIN A for clothing mgt to pull up from ankles adn pt able to don over hips the rest of the way with CGA in standing with intermittent UE Support on RW and no overt LOB. Pt returned to bed per his request. Progressing towards goals and continues to benefit from skilled OT Services.    Recommendations for follow up therapy are one component of a multi-disciplinary discharge planning process, led by the attending physician.  Recommendations may be updated based on patient status, additional functional criteria and insurance authorization.    Assistance Recommended at Discharge Intermittent Supervision/Assistance  Patient can return home with the following  A little help with bathing/dressing/bathroom;A little help with walking and/or transfers;Assistance with cooking/housework;Assist for transportation;Help with stairs or ramp for entrance   Equipment Recommendations  None recommended by OT    Recommendations for Other Services      Precautions / Restrictions Precautions Precautions: Fall Restrictions Weight Bearing Restrictions: No       Mobility Bed Mobility Overal bed mobility: Modified Independent                  Transfers Overall transfer level: Needs assistance Equipment used: Rolling walker (2 wheels) Transfers: Sit to/from Stand Sit to Stand: Min guard                 Balance Overall balance assessment: Needs assistance Sitting-balance  support: Feet supported Sitting balance-Leahy Scale: Good     Standing balance support: Single extremity supported, During functional activity, Bilateral upper extremity supported Standing balance-Leahy Scale: Fair                             ADL either performed or assessed with clinical judgement   ADL Overall ADL's : Needs assistance/impaired     Grooming: Sitting;Wash/dry face;Set up;Wash/dry hands                   Toilet Transfer: BSC/3in1;Rolling walker (2 wheels);Min guard;Ambulation Toilet Transfer Details (indicate cue type and reason): short ambulatory transfer Toileting- Clothing Manipulation and Hygiene: Sit to/from stand;Minimal assistance;Set up;Min guard Toileting - Clothing Manipulation Details (indicate cue type and reason): set up and CGA for standing pericare; MIN A for clothing mgt to pull up from ankles adn pt able to don over hips the rest of the way with CGA in standing with intermittent UE Support on RW and no overt LOB     Functional mobility during ADLs: Min guard;Rolling walker (2 wheels)      Extremity/Trunk Assessment              Vision       Perception     Praxis      Cognition Arousal/Alertness: Awake/alert Behavior During Therapy: WFL for tasks assessed/performed Overall Cognitive Status: Within Functional Limits for tasks assessed  Exercises      Shoulder Instructions       General Comments      Pertinent Vitals/ Pain       Pain Assessment Pain Assessment: No/denies pain  Home Living                                          Prior Functioning/Environment              Frequency  Min 1X/week        Progress Toward Goals  OT Goals(current goals can now be found in the care plan section)  Progress towards OT goals: Progressing toward goals  Acute Rehab OT Goals Patient Stated Goal: go home OT Goal Formulation:  With patient Time For Goal Achievement: 05/28/22 Potential to Achieve Goals: Good  Plan Discharge plan remains appropriate;Frequency needs to be updated    Co-evaluation                 AM-PAC OT "6 Clicks" Daily Activity     Outcome Measure   Help from another person eating meals?: None Help from another person taking care of personal grooming?: A Little Help from another person toileting, which includes using toliet, bedpan, or urinal?: A Little Help from another person bathing (including washing, rinsing, drying)?: A Little Help from another person to put on and taking off regular upper body clothing?: None Help from another person to put on and taking off regular lower body clothing?: A Little 6 Click Score: 20    End of Session Equipment Utilized During Treatment: Gait belt;Rolling walker (2 wheels)  OT Visit Diagnosis: Unsteadiness on feet (R26.81)   Activity Tolerance Patient tolerated treatment well   Patient Left in bed;with call bell/phone within reach;with bed alarm set   Nurse Communication          Time: 7846-9629 OT Time Calculation (min): 12 min  Charges: OT General Charges $OT Visit: 1 Visit OT Treatments $Self Care/Home Management : 8-22 mins  Arman Filter., MPH, MS, OTR/L ascom [phone] 05/24/22, 11:25 AM

## 2022-05-24 NOTE — Progress Notes (Signed)
Physical Therapy Treatment Patient Details Name: Spencer Coleman MRN: [id number] DOB: [birth year] Today's Date: 05/24/2022   History of Present Illness Pt is an 89 y.o. male with history of hypertension, hyperlipidemia, CKD, CHF, AICD, work up for acute on chronic systolic CHF exacerbation.    PT Comments    Pt was pleasant and motivated to participate during the session and put forth good effort throughout. Pt required cuing for sequencing with transfers and gait but no physical assistance.  Pt did require significant effort to come to standing from a standard height toilet with BUEs on grab bar and would benefit from a BSC at discharge for improved safety with transfers from toilet.  Pt reported no adverse symptoms during the session with SpO2 and HR WNL on room air.  Pt will benefit from continued PT services upon discharge to safely address deficits listed in patient problem list for decreased caregiver assistance and eventual return to PLOF.     Recommendations for follow up therapy are one component of a multi-disciplinary discharge planning process, led by the attending physician.  Recommendations may be updated based on patient status, additional functional criteria and insurance authorization.  Follow Up Recommendations       Assistance Recommended at Discharge Frequent or constant Supervision/Assistance  Patient can return home with the following Assist for transportation;Direct supervision/assist for medications management;A little help with walking and/or transfers;A little help with bathing/dressing/bathroom;Assistance with cooking/housework   Equipment Recommendations  BSC/3in1    Recommendations for Other Services       Precautions / Restrictions Precautions Precautions: Fall Restrictions Weight Bearing Restrictions: No     Mobility  Bed Mobility               General bed mobility comments: NT, pt in recliner    Transfers Overall transfer level: Needs  assistance Equipment used: Rolling walker (2 wheels) Transfers: Sit to/from Stand Sit to Stand: Min guard           General transfer comment: Mod verbal cues for sequencing, heavy use of grab bar in BR with BUEs to stand from standard height toilet    Ambulation/Gait Ambulation/Gait assistance: Min guard Gait Distance (Feet): 150 Feet x 1, 15 Feet x 2 Assistive device: Rolling walker (2 wheels) Gait Pattern/deviations: Step-through pattern, Decreased step length - right, Decreased step length - left, Trunk flexed Gait velocity: decreased     General Gait Details: Improved cadence compared to prior sessions with frequent cues for amb closer to the RW with upright posture, steady without LOB   Stairs             Wheelchair Mobility    Modified Rankin (Stroke Patients Only)       Balance Overall balance assessment: Needs assistance   Sitting balance-Leahy Scale: Good     Standing balance support: During functional activity, Bilateral upper extremity supported, Reliant on assistive device for balance Standing balance-Leahy Scale: Fair                              Cognition Arousal/Alertness: Awake/alert Behavior During Therapy: WFL for tasks assessed/performed Overall Cognitive Status: Within Functional Limits for tasks assessed                                          Exercises Total Joint Exercises Ankle Circles/Pumps: Strengthening, Both, 10  reps [employer]: Strengthening, Both, 10 reps Long [employer]: AROM, Both, 10 reps, Strengthening Knee Flexion: Strengthening, Both, 10 reps, AROM Other Exercises Other Exercises: Sit to/from stand transfer training from various height surfaces    General Comments        Pertinent Vitals/Pain Pain Assessment Pain Assessment: No/denies pain    Home Living                          Prior Function            PT Goals (current goals can now be found in the care plan  section) Progress towards PT goals: Progressing toward goals    Frequency    Min 2X/week      PT Plan Current plan remains appropriate    Co-evaluation              AM-PAC PT "6 Clicks" Mobility   Outcome Measure  Help needed turning from your back to your side while in a flat bed without using bedrails?: None Help needed moving from lying on your back to sitting on the side of a flat bed without using bedrails?: None Help needed moving to and from a bed to a chair (including a wheelchair)?: A Little Help needed standing up from a chair using your arms (e.g., wheelchair or bedside chair)?: A Little Help needed to walk in hospital room?: A Little Help needed climbing 3-5 steps with a railing? : A Little 6 Click Score: 20    End of Session Equipment Utilized During Treatment: Gait belt Activity Tolerance: Patient tolerated treatment well Patient left: with call bell/phone within reach;with chair alarm set;in chair Nurse Communication: Mobility status PT Visit Diagnosis: Other abnormalities of gait and mobility (R26.89);Muscle weakness (generalized) (M62.81)     Time: 1610-9604 PT Time Calculation (min) (ACUTE ONLY): 20 min  Charges:  $Gait Training: 8-22 mins                     D. Scott Ryker Pherigo PT, DPT 05/24/22, 2:28 PM

## 2022-05-24 NOTE — TOC Progression Note (Signed)
Transition of Care [facility]) - Progression Note    Patient Details  Name: Spencer Coleman MRN: [id number] Date of Birth: [birth year]  [facility]) CM/SW Contact  Truddie Hidden, RN Phone Number: 05/24/2022, 10:07 AM  Clinical Narrative:    Spoke with patient's son, [name], regarding discharge plan for patient to return home. [name] stated he is not able to care for the patient. He states he is disabled himself and is taking care of his disabled wife. The patient returning home with care and supervison from [name] is not an option.   Retrieved messages from [name], patient's daughter regarding ALF. No answer. Left a message.         Expected Discharge Plan and Services                                               Social Determinants of Health (SDOH) Interventions SDOH Screenings   Food Insecurity: No Food Insecurity (05/16/2022)  Housing: Low Risk  (05/16/2022)  Transportation Needs: No Transportation Needs (05/16/2022)  Utilities: Not At Risk (05/16/2022)  Tobacco Use: Low Risk  (05/18/2022)    Readmission Risk Interventions     No data to display

## 2022-05-24 NOTE — TOC Progression Note (Signed)
Transition of Care [facility]) - Progression Note    Patient Details  Name: Spencer Coleman MRN: [id number] Date of Birth: 1930/11/30  Transition of Care [facility]) CM/SW Contact  Truddie Hidden, RN Phone Number: 05/24/2022, 9:53 AM  Clinical Narrative:    Spoke with patient's daughter, Spencer Coleman regarding discharge plan. She inquired about having patient return home vs ALF.  RNCM reiterated patient would require supervision related to deficits in cognition per MD notes. She stated she was told from the patient her brother would able to care for the patient. She has not confirmed this with her brother. Spencer Coleman was advised patient may be eligible for ALF. Will refer her back to [facility] for assistance with ALF questions about admittance, and billing.         Expected Discharge Plan and Services                                               Social Determinants of Health (SDOH) Interventions SDOH Screenings   Food Insecurity: No Food Insecurity (05/16/2022)  Housing: Low Risk  (05/16/2022)  Transportation Needs: No Transportation Needs (05/16/2022)  Utilities: Not At Risk (05/16/2022)  Tobacco Use: Low Risk  (05/18/2022)    Readmission Risk Interventions     No data to display

## 2022-05-24 NOTE — Progress Notes (Signed)
Progress Note    Spencer Coleman  RUE:[id number] DOB: December 06, 1930  DOA: 05/12/2022 PCP: Barbette Reichmann, MD      Brief Narrative:    Medical records reviewed and are as summarized below:  Spencer Coleman is a 89 y.o. male PMH of Paroxysmal Atrial Fibrillation, Ventricular Tachycardia s/p BiV ICD (08/09/2007), HLD, HTN, Dilated Cardiomyopathy, Chronic Systolic CHF,  Chronic Kidney Disease, who presented to the hospital with shortness of breath.       Assessment/Plan:   Principal Problem:   Acute hypoxic respiratory failure (HCC) Active Problems:   Acute on chronic systolic CHF (congestive heart failure) (HCC)   CAD (coronary artery disease)   PAF (paroxysmal atrial fibrillation) (HCC)   Automatic implantable cardioverter-defibrillator in situ   CKD (chronic kidney disease) stage 3, GFR 30-59 ml/min (HCC)   Hypotension   Body mass index is 20.66 kg/m.    Acute on chronic systolic CHF, peripheral edema: S/p AICD: Continue Lasix.  2D echo showed EF estimated at 20 to 25%, grade 2 diastolic dysfunction, moderate LVH.  BNP was 3,155.   Acute hypoxic respiratory failure: Resolved.  Oxygen saturation with ambulation was 96% on room air.   Paroxysmal atrial fibrillation: Continue metoprolol, amiodarone and Eliquis   Hypotension: BP stable.  Losartan is still on hold.     Acute UTI: No fever or leukocytosis.  Completed 3 days of cefdinir and cefuroxime on 05/19/2022.  Urine culture did not show any significant growth.   CKD stage IIIa: Creatinine is stable.   Memory impairment, suspected underlying dementia: Code stroke called on 05/16/2022 but CT head did not show any acute abnormality.  Altered mental status was attributed to Ativan at that time.   Thrombocytopenia: Platelet count is stable.   History of falls at home: Continue ambulation as able with mobility specialist   Plan discussed with [name], daughter, over the phone.  She is going to find a private  caregiver to assist the patient at home. No safe discharge plan at this time.    Diet Order             Diet 2 gram sodium Room service appropriate? No; Fluid consistency: Thin  Diet effective now                            Consultants: Cardiologist Palliative care  Procedures: None    Medications:    amiodarone  400 mg Oral BID   Followed by   Melene Muller ON 05/28/2022] amiodarone  200 mg Oral Daily   apixaban  2.5 mg Oral BID   furosemide  40 mg Oral Daily   metoprolol succinate  12.5 mg Oral Daily   QUEtiapine  12.5 mg Oral QHS   simvastatin  40 mg Oral q1800   Continuous Infusions:  sodium chloride       Anti-infectives (From admission, onward)    Start     Dose/Rate Route Frequency Ordered Stop   05/18/22 0800  cefUROXime (CEFTIN) tablet 250 mg        250 mg Oral 2 times daily with meals 05/17/22 1428 05/19/22 1637   05/17/22 1200  cefdinir (OMNICEF) capsule 300 mg  Status:  Discontinued        300 mg Oral Daily 05/17/22 1113 05/17/22 1428              Family Communication/Anticipated D/C date and plan/Code Status   DVT prophylaxis: apixaban (ELIQUIS) tablet 2.5  mg Start: 05/12/22 2200 SCDs Start: 05/12/22 1552 apixaban (ELIQUIS) tablet 2.5 mg     Code Status: DNR  Family Communication: Plan discussed with [name], daughter, over the phone Disposition Plan: To be determined   Status is: Inpatient Remains inpatient appropriate because: No safe discharge plan       Subjective:   Interval events noted.  No complaints.  No shortness of breath, dizziness or chest pain.  Objective:    Vitals:   05/23/22 1637 05/23/22 1929 05/24/22 0607 05/24/22 0718  BP: 123/68 137/88 112/75 101/62  Pulse: 70 72 72 70  Resp: 16 17 15 16   Temp: 98.2 F (36.8 C) 98.1 F (36.7 C) 98.3 F (36.8 C) 98.2 F (36.8 C)  TempSrc: Oral  Oral   SpO2: 98% 98% 99% 96%  Weight:      Height:       No data found.   Intake/Output Summary (Last 24  hours) at 05/24/2022 1502 Last data filed at 05/24/2022 1203 Gross per 24 hour  Intake 360 ml  Output 800 ml  Net -440 ml   Filed Weights   05/21/22 0300 05/22/22 0400 05/23/22 0500  Weight: 66.7 kg 66.5 kg 65.3 kg    Exam:  GEN: NAD SKIN: Warm and dry EYES: No pallor or icterus ENT: MMM CV: RRR PULM: CTA B ABD: soft, ND, NT, +BS CNS: AAO x 3, non focal EXT: Bilateral distal leg and pedal edema, no erythema or tenderness     Data Reviewed:   I have personally reviewed following labs and imaging studies:  Labs: Labs show the following:   Basic Metabolic Panel: Recent Labs  Lab 05/19/22 0357 05/20/22 0553 05/21/22 0433 05/22/22 0452 05/23/22 0553  NA 138 138 136 136 139  K 3.9 4.5 4.2 4.2 4.1  CL 100 101 100 99 99  CO2 30 29 30 29 31   GLUCOSE 112* 91 93 101* 101*  BUN 38* 41* 40* 44* 42*  CREATININE 1.39* 1.58* 1.52* 1.78* 1.58*  CALCIUM 8.9 9.0 8.9 8.7* 8.8*  MG 2.4 2.5* 2.3 2.2 2.4   GFR Estimated Creatinine Clearance: 29.3 mL/min (A) (by C-G formula based on SCr of 1.58 mg/dL (H)). Liver Function Tests: No results for input(s): "AST", "ALT", "ALKPHOS", "BILITOT", "PROT", "ALBUMIN" in the last 168 hours.  No results for input(s): "LIPASE", "AMYLASE" in the last 168 hours. No results for input(s): "AMMONIA" in the last 168 hours. Coagulation profile No results for input(s): "INR", "PROTIME" in the last 168 hours.  CBC: Recent Labs  Lab 05/19/22 0357 05/20/22 0553 05/21/22 0433 05/22/22 0452 05/23/22 0553  WBC 6.5 6.4 6.0 6.1 5.9  HGB 13.4 13.1 13.1 12.4* 12.7*  HCT 42.0 40.8 41.3 39.2 40.9  MCV 92.7 92.7 93.9 93.3 94.0  PLT 105* 124* 128* 126* 126*   Cardiac Enzymes: No results for input(s): "CKTOTAL", "CKMB", "CKMBINDEX", "TROPONINI" in the last 168 hours. BNP (last 3 results) No results for input(s): "PROBNP" in the last 8760 hours. CBG: No results for input(s): "GLUCAP" in the last 168 hours.  D-Dimer: No results for input(s):  "DDIMER" in the last 72 hours. Hgb A1c: No results for input(s): "HGBA1C" in the last 72 hours. Lipid Profile: No results for input(s): "CHOL", "HDL", "LDLCALC", "TRIG", "CHOLHDL", "LDLDIRECT" in the last 72 hours. Thyroid function studies: No results for input(s): "TSH", "T4TOTAL", "T3FREE", "THYROIDAB" in the last 72 hours.  Invalid input(s): "FREET3" Anemia work up: No results for input(s): "VITAMINB12", "FOLATE", "FERRITIN", "TIBC", "IRON", "RETICCTPCT" in the last  72 hours. Sepsis Labs: Recent Labs  Lab 05/20/22 0553 05/21/22 0433 05/22/22 0452 05/23/22 0553  WBC 6.4 6.0 6.1 5.9    Microbiology No results found for this or any previous visit (from the past 240 hour(s)).   Procedures and diagnostic studies:  No results found.             LOS: 12 days   Heidie Krall  [facility] [employer]ered loss adjuster on [email or web address]. If 7PM-7AM, please contact night-coverage at [email or web address]     05/24/2022, 3:02 PM

## 2022-05-24 NOTE — TOC Progression Note (Signed)
Transition of Care [facility]) - Progression Note    Patient Details  Name: Spencer Coleman MRN: [id number] Date of Birth: [birth year]  Transition of [facility]) CM/SW Contact  Truddie Hidden, RN Phone Number: 05/24/2022, 11:31 AM  Clinical Narrative:    Sherron Monday with [name], patient's daughter. She has been advised her brother is unable to provide care for patient at discharge. She inquired about transportation for patient via EMS. She has been advised coverage is unlikely due to patient being able to ambulate. [name] inquired about  other modes of transportation ie, Psychologist, educational. RNCM advised due to patient's disorientation he would not be sent home by other transportation modes other than family or EMS.         Expected Discharge Plan and Services                                               Social Determinants of Health (SDOH) Interventions SDOH Screenings   Food Insecurity: No Food Insecurity (05/16/2022)  Housing: Low Risk  (05/16/2022)  Transportation Needs: No Transportation Needs (05/16/2022)  Utilities: Not At Risk (05/16/2022)  Tobacco Use: Low Risk  (05/18/2022)    Readmission Risk Interventions     No data to display

## 2022-05-24 NOTE — Progress Notes (Signed)
                                                     Palliative Care Progress Note, Assessment & Plan   Patient Name: Spencer Coleman       Date: 05/24/2022 DOB: [birth year]  Age: 89 y.o. MRN#: [id number] Attending Physician: Lurene Shadow, MD Primary Care Physician: Barbette Reichmann, MD Admit Date: 05/12/2022  Chart reviewed. No acute palliative needs today.  I returned a call from patient's sister [name].  [name] shared concerns that family was planning to have patient return home today.  I conveyed that discharge orders are not in and discharge plan is not yet in place.  I highlighted that TOC is following closely and speaking with patient's daughter Wendy/HCPOA in regards to safe discharge plan.I encouraged [name] to speak with TOC in regards to discharge concerns.   PMT will continue to follow and monitor peripherally.   Samara Deist L. Manon Hilding, FNP-BC Palliative Medicine Team Team Phone # [phone]   No charge

## 2022-05-25 DIAGNOSIS — I509 Heart failure, unspecified: Secondary | ICD-10-CM | POA: Diagnosis not present

## 2022-05-25 DIAGNOSIS — G9340 Encephalopathy, unspecified: Secondary | ICD-10-CM

## 2022-05-25 DIAGNOSIS — I480 Paroxysmal atrial fibrillation: Secondary | ICD-10-CM | POA: Diagnosis not present

## 2022-05-25 DIAGNOSIS — I472 Ventricular tachycardia, unspecified: Secondary | ICD-10-CM | POA: Diagnosis not present

## 2022-05-25 DIAGNOSIS — R000 Tachycardia, unspecified: Secondary | ICD-10-CM | POA: Diagnosis not present

## 2022-05-25 DIAGNOSIS — J9601 Acute respiratory failure with hypoxia: Secondary | ICD-10-CM | POA: Diagnosis not present

## 2022-05-25 NOTE — Progress Notes (Addendum)
Mobility Specialist - Progress Note   05/25/22 1600  Mobility  Activity Ambulated with assistance in hallway;Transferred to/from [facility]  Level of Assistance Standby assist, set-up cues, supervision of patient - no hands on  Assistive Device Front wheel walker  Distance Ambulated (ft) 480 ft  Activity Response Tolerated well  $Mobility charge 1 Mobility     Pre-mobility: 82 HR During mobility: 100 HR Post-mobility: 86 HR   Pt lying in bed upon arrival, utilizing RA. Pt agreeable to activity. Pt completed bed mobility modI and transferred to [facility] with minG, no AD. Pt able to don shoes with supervision in figure 4 positioning, very mild lateral LOB while seated donning shoes. Pt continued activity into hallway with minG + RW. No LOB or complaints. Noted pt with B swelling in foot/ankle R > L and red toes on LLE. Very cold B feet. RN notified. Pt returned to bed with alarm set, needs in reach. Sitter present.    Spencer Coleman Mobility Specialist 05/25/22, 4:23 PM

## 2022-05-25 NOTE — Progress Notes (Signed)
PROGRESS NOTE    Spencer Coleman  ZOX:[id number] DOB: [birth year] DOA: 05/12/2022 PCP: Barbette Reichmann, MD    Assessment & Plan:   Principal Problem:   Acute hypoxic respiratory failure (HCC) Active Problems:   Acute on chronic systolic CHF (congestive heart failure) (HCC)   CAD (coronary artery disease)   PAF (paroxysmal atrial fibrillation) (HCC)   Automatic implantable cardioverter-defibrillator in situ   CKD (chronic kidney disease) stage 3, GFR 30-59 ml/min (HCC)   Hypotension  Assessment and Plan: Acute on chronic systolic CHF: continue on lasix. Monitor I/Os. Echo showed EF estimated at 20 to 25%, grade 2 diastolic dysfunction, moderate LVH.  BNP was 3,155.  Dilated cardiomyopathy: w/ hx of ventricular tachycardia, s/p AICD placement. Continue on metoprolol, amio as per cardio. Keep MAP >65. Cardio signed off on 05/20/22   Acute hypoxic respiratory failure: resolved    PAF: Continue metoprolol, amio, eliquis     Hypotension: holding home dose of losartan. BP on low end of normal    Acute UTI: No fever or leukocytosis.  Completed 3 days of cefdinir and cefuroxime on 05/19/2022.  Urine culture did not show any significant growth.   CKDIIIa: Cr is labile. Avoid nephrotoxic meds    Poor short term memory: no formal dx of dementia. Code stroke called on 05/16/2022 but CT head did not show any acute abnormality.  Altered mental status was attributed to Ativan at that time & has since been d/c. Continue on seroquel    Thrombocytopenia: labile. Will continue to monitor    Hx of falls at home: continue ambulation w/ mobility specialist       DVT prophylaxis: eliquis  Code Status: DNR Family Communication:  Disposition Plan: unsafe d/c plan   Level of care: Progressive Status is: Inpatient Remains inpatient appropriate because: medically stable for d/c but unsafe d/c plan   Consultants:  cardio  Procedures:   Antimicrobials:    Subjective: Pt c/o fatigue    Objective: Vitals:   05/24/22 0718 05/24/22 1716 05/24/22 1927 05/25/22 0554  BP: 101/62 123/72 108/60 (!) 94/54  Pulse: 70 71 69 70  Resp: 16 16 15 16   Temp: 98.2 F (36.8 C) 98.2 F (36.8 C) (!) 97.4 F (36.3 C) 97.6 F (36.4 C)  TempSrc:   Oral Oral  SpO2: 96% 99% 96% 100%  Weight:    65.3 kg  Height:        Intake/Output Summary (Last 24 hours) at 05/25/2022 0852 Last data filed at 05/24/2022 1833 Gross per 24 hour  Intake 1080 ml  Output 1200 ml  Net -120 ml   Filed Weights   05/22/22 0400 05/23/22 0500 05/25/22 0554  Weight: 66.5 kg 65.3 kg 65.3 kg    Examination:  General exam: Appears calm and comfortable  Respiratory system: Clear to auscultation. Respiratory effort normal. Cardiovascular system: S1 & S2 +. No rubs, gallops or clicks.  Gastrointestinal system: Abdomen is nondistended, soft and nontender.Normal bowel sounds heard. Central nervous system: Alert and oriented x 3 but not situation  Psychiatry: Judgement and insight appears at baseline. Mood & affect appropriate.     Data Reviewed: I have personally reviewed following labs and imaging studies  CBC: Recent Labs  Lab 05/19/22 0357 05/20/22 0553 05/21/22 0433 05/22/22 0452 05/23/22 0553  WBC 6.5 6.4 6.0 6.1 5.9  HGB 13.4 13.1 13.1 12.4* 12.7*  HCT 42.0 40.8 41.3 39.2 40.9  MCV 92.7 92.7 93.9 93.3 94.0  PLT 105* 124* 128* 126* 126*  Basic Metabolic Panel: Recent Labs  Lab 05/19/22 0357 05/20/22 0553 05/21/22 0433 05/22/22 0452 05/23/22 0553  NA 138 138 136 136 139  K 3.9 4.5 4.2 4.2 4.1  CL 100 101 100 99 99  CO2 30 29 30 29 31   GLUCOSE 112* 91 93 101* 101*  BUN 38* 41* 40* 44* 42*  CREATININE 1.39* 1.58* 1.52* 1.78* 1.58*  CALCIUM 8.9 9.0 8.9 8.7* 8.8*  MG 2.4 2.5* 2.3 2.2 2.4   GFR: Estimated Creatinine Clearance: 29.3 mL/min (A) (by C-G formula based on SCr of 1.58 mg/dL (H)). Liver Function Tests: No results for input(s): "AST", "ALT", "ALKPHOS", "BILITOT", "PROT",  "ALBUMIN" in the last 168 hours. No results for input(s): "LIPASE", "AMYLASE" in the last 168 hours. No results for input(s): "AMMONIA" in the last 168 hours. Coagulation Profile: No results for input(s): "INR", "PROTIME" in the last 168 hours. Cardiac Enzymes: No results for input(s): "CKTOTAL", "CKMB", "CKMBINDEX", "TROPONINI" in the last 168 hours. BNP (last 3 results) No results for input(s): "PROBNP" in the last 8760 hours. HbA1C: No results for input(s): "HGBA1C" in the last 72 hours. CBG: No results for input(s): "GLUCAP" in the last 168 hours. Lipid Profile: No results for input(s): "CHOL", "HDL", "LDLCALC", "TRIG", "CHOLHDL", "LDLDIRECT" in the last 72 hours. Thyroid Function Tests: No results for input(s): "TSH", "T4TOTAL", "FREET4", "T3FREE", "THYROIDAB" in the last 72 hours. Anemia Panel: No results for input(s): "VITAMINB12", "FOLATE", "FERRITIN", "TIBC", "IRON", "RETICCTPCT" in the last 72 hours. Sepsis Labs: No results for input(s): "PROCALCITON", "LATICACIDVEN" in the last 168 hours.  No results found for this or any previous visit (from the past 240 hour(s)).       Radiology Studies: No results found.      Scheduled Meds:  amiodarone  400 mg Oral BID   Followed by   Melene Muller ON 05/28/2022] amiodarone  200 mg Oral Daily   apixaban  2.5 mg Oral BID   furosemide  40 mg Oral Daily   metoprolol succinate  12.5 mg Oral Daily   QUEtiapine  12.5 mg Oral QHS   simvastatin  40 mg Oral q1800   Continuous Infusions:  sodium chloride       LOS: 13 days    Time spent: 25 mins     Charise Killian, MD [facility] Pager [phone]  If 7PM-7AM, please contact night-coverage [email or web address] 05/25/2022, 8:52 AM

## 2022-05-25 NOTE — Progress Notes (Signed)
Palliative Care Progress Note, Assessment & Plan   Patient Name: Spencer Coleman       Date: 05/25/2022 DOB: 08-09-1930  Age: 89 y.o. MRN#: [id number] Attending Physician: Charise Killian, MD Primary Care Physician: Barbette Reichmann, MD Admit Date: 05/12/2022  Subjective: Patient is sitting up in bed with sitter at bedside.  No family or friends present during my visit.  He acknowledges my presence and is able to make his wishes known.  He is alert and oriented to self and place.  He can recall what he had for lunch and breakfast.  However, he repeats a story several times about how he saw his medical records and a picture of his heart valves "hanging out" with 10 different places that "looked bad".   HPI: 89 y.o. male  with past medical history of PAF, Ventricular Tachycardia s/p BiV ICD (2009), HLD, HTN, dilated cardiomyopathy, chronic systolic heart failure and CKD admitted from home on 05/12/2022 with complaints of shortness of breath. EMS was called by sister as she was concerned about ongoing weakness and possible UTI being managed as outpatient.   On arrival to ED, found to be in wide-complex tachycardia (140's) with soft SBP (80-90's). BNP >3,000 and CXR concerning for vascular congestion. Started on amiodarone infusion, initially admitted to ICU due to concern for needing vasopressor support given hypotension-vasopressors were not initiated    Transitioned to PO amiodarone 5/3, tolerating well   Episodes of confusion, AMS, agitation throughout hospitalization, mentation seems to wax and wane   Palliative medicine was consulted for assisting with goals of care conversations.  Summary of counseling/coordination of care: After reviewing the patient's chart and assessing the patient at bedside, I spoke  with patient in regards to symptom burden.  He denies pain or other discomfort at this time.  Bruising of upper extremities noted but patient denies pain or discomfort in these areas.  Range of motion WNL.  Symptom burden is low.  No adjustments to medications needed at this time. After assessing the patient, I spoke with patient's daughter [name] over the phone.  She expressed concern that patient makes too much money to apply for Medicaid but does not have enough money to pay for 24-hour care.  She does not believe he is appropriate for a locked facility/memory care unit at this time.  She is hopeful that always best care can continue with assistance to find a safe discharge plan.  TOC following closely.  Therapeutic silence, active listening, and emotional support provided.  DNR remains.  PMT will continue to follow and support patient throughout his hospitalization and monitor him peripherally.   Physical Exam Constitutional:      General: He is not in acute distress.    Appearance: He is normal weight. He is not ill-appearing.  HENT:     Head: Normocephalic.  Cardiovascular:     Rate and Rhythm: Normal rate.  Pulmonary:     Effort: Pulmonary effort is normal.  Skin:    General: Skin is warm and dry.  Neurological:     Mental Status: He is alert.     Comments: Oriented to self and place  Psychiatric:  Mood and Affect: Mood is not anxious.        Behavior: Behavior is not agitated.             Total Time 25 minutes   Zhoey Blackstock L. Manon Hilding, FNP-BC Palliative Medicine Team Team Phone # [phone]

## 2022-05-26 DIAGNOSIS — I480 Paroxysmal atrial fibrillation: Secondary | ICD-10-CM | POA: Diagnosis not present

## 2022-05-26 DIAGNOSIS — I472 Ventricular tachycardia, unspecified: Secondary | ICD-10-CM | POA: Diagnosis not present

## 2022-05-26 DIAGNOSIS — G9340 Encephalopathy, unspecified: Secondary | ICD-10-CM | POA: Diagnosis not present

## 2022-05-26 NOTE — Progress Notes (Signed)
PROGRESS NOTE    Spencer Coleman  ZOX:[id number] DOB: Aug 12, 1930 DOA: 05/12/2022 PCP: Barbette Reichmann, MD    Assessment & Plan:   Principal Problem:   Acute hypoxic respiratory failure (HCC) Active Problems:   Acute on chronic systolic CHF (congestive heart failure) (HCC)   CAD (coronary artery disease)   PAF (paroxysmal atrial fibrillation) (HCC)   Automatic implantable cardioverter-defibrillator in situ   CKD (chronic kidney disease) stage 3, GFR 30-59 ml/min (HCC)   Hypotension  Assessment and Plan: Acute on chronic systolic CHF: continue on lasix. Monitor I/Os. Echo showed EF estimated at 20 to 25%, grade 2 diastolic dysfunction, moderate LVH.  BNP was 3,155.  Dilated cardiomyopathy: w/ hx of ventricular tachycardia, s/p AICD placement. Continue on amio, metoprolol. Cardio signed off 05/20/22   Acute hypoxic respiratory failure: resolved    PAF: Continue on metoprolol, amio & eliquis     Hypotension: holding home dose of losartan. BP on lower end of normal    Acute UTI: No fever or leukocytosis.  Completed abx course. Abxs were started based off of outpatient urine cx results    CKDIIIa: Cr is labile. Avoid nephrotoxic meds    Poor short term memory: no formal dx of dementia. Code stroke called on 05/16/2022 but CT head did not show any acute abnormality.  Altered mental status was attributed to Ativan at that time & has since been d/c. Continue on seroquel    Thrombocytopenia: labile. Will monitor intermittently    Hx of falls at home: continue ambulation w/ mobility specialist       DVT prophylaxis: eliquis  Code Status: DNR Family Communication:  Disposition Plan: unsafe d/c plan.   Level of care: Progressive Status is: Inpatient Remains inpatient appropriate because: medically stable for d/c but unsafe d/c plan   Consultants:  cardio  Procedures:   Antimicrobials:    Subjective: Pt denies any complaints   Objective: Vitals:   05/25/22 2026 05/25/22  2327 05/26/22 0336 05/26/22 0337  BP: 105/68 99/64 (!) 102/56   Pulse: 69 70 70   Resp: 16 15 17    Temp: 98.4 F (36.9 C) 98.4 F (36.9 C) 97.8 F (36.6 C)   TempSrc: Oral Oral Oral   SpO2: 97% 96% 95%   Weight:    64.9 kg  Height:        Intake/Output Summary (Last 24 hours) at 05/26/2022 0816 Last data filed at 05/25/2022 1926 Gross per 24 hour  Intake 480 ml  Output --  Net 480 ml   Filed Weights   05/23/22 0500 05/25/22 0554 05/26/22 0337  Weight: 65.3 kg 65.3 kg 64.9 kg    Examination:  General exam: appears comfortable  Respiratory system: clear breath sounds b/l  Cardiovascular system: S1/S2+. No rubs or clicks   Gastrointestinal system: abd is soft, NT, ND & normal bowel sounds  Central nervous system: Alert and oriented x 3 but not situation  Psychiatry: judgement and insight appears at baseline. Appropriate mood and affect     Data Reviewed: I have personally reviewed following labs and imaging studies  CBC: Recent Labs  Lab 05/20/22 0553 05/21/22 0433 05/22/22 0452 05/23/22 0553  WBC 6.4 6.0 6.1 5.9  HGB 13.1 13.1 12.4* 12.7*  HCT 40.8 41.3 39.2 40.9  MCV 92.7 93.9 93.3 94.0  PLT 124* 128* 126* 126*   Basic Metabolic Panel: Recent Labs  Lab 05/20/22 0553 05/21/22 0433 05/22/22 0452 05/23/22 0553  NA 138 136 136 139  K 4.5 4.2 4.2  4.1  CL 101 100 99 99  CO2 29 30 29 31   GLUCOSE 91 93 101* 101*  BUN 41* 40* 44* 42*  CREATININE 1.58* 1.52* 1.78* 1.58*  CALCIUM 9.0 8.9 8.7* 8.8*  MG 2.5* 2.3 2.2 2.4   GFR: Estimated Creatinine Clearance: 29.1 mL/min (A) (by C-G formula based on SCr of 1.58 mg/dL (H)). Liver Function Tests: No results for input(s): "AST", "ALT", "ALKPHOS", "BILITOT", "PROT", "ALBUMIN" in the last 168 hours. No results for input(s): "LIPASE", "AMYLASE" in the last 168 hours. No results for input(s): "AMMONIA" in the last 168 hours. Coagulation Profile: No results for input(s): "INR", "PROTIME" in the last 168  hours. Cardiac Enzymes: No results for input(s): "CKTOTAL", "CKMB", "CKMBINDEX", "TROPONINI" in the last 168 hours. BNP (last 3 results) No results for input(s): "PROBNP" in the last 8760 hours. HbA1C: No results for input(s): "HGBA1C" in the last 72 hours. CBG: No results for input(s): "GLUCAP" in the last 168 hours. Lipid Profile: No results for input(s): "CHOL", "HDL", "LDLCALC", "TRIG", "CHOLHDL", "LDLDIRECT" in the last 72 hours. Thyroid Function Tests: No results for input(s): "TSH", "T4TOTAL", "FREET4", "T3FREE", "THYROIDAB" in the last 72 hours. Anemia Panel: No results for input(s): "VITAMINB12", "FOLATE", "FERRITIN", "TIBC", "IRON", "RETICCTPCT" in the last 72 hours. Sepsis Labs: No results for input(s): "PROCALCITON", "LATICACIDVEN" in the last 168 hours.  No results found for this or any previous visit (from the past 240 hour(s)).       Radiology Studies: No results found.      Scheduled Meds:  amiodarone  400 mg Oral BID   Followed by   Melene Muller ON 05/28/2022] amiodarone  200 mg Oral Daily   apixaban  2.5 mg Oral BID   furosemide  40 mg Oral Daily   metoprolol succinate  12.5 mg Oral Daily   QUEtiapine  12.5 mg Oral QHS   simvastatin  40 mg Oral q1800   Continuous Infusions:  sodium chloride       LOS: 14 days    Time spent: 25 mins     Charise Killian, MD [facility] Pager [phone]  If 7PM-7AM, please contact night-coverage [email or web address] 05/26/2022, 8:16 AM

## 2022-05-26 NOTE — TOC Progression Note (Signed)
Transition of Care [facility]) - Progression Note    Patient Details  Name: Spencer Coleman MRN: [id number] Date of Birth: 1930/09/08  Transition of Care [facility]) CM/SW Contact  Truddie Hidden, RN Phone Number: 05/26/2022, 2:34 PM  Clinical Narrative:    Spoke with patient's daughter, [name] regarding discharge plan and updates. [name] has been provided options for care post discharge by Candie Chroman of [facility].  Family is considering ALF. Patient would need a formal diagnosis of dementia to be approved for medicaid special assistance by the state. [name] was advised this information can be obtained by his PCP with [facility] recommending memory care given he has been seen within the last 90 days. [name] stated she has attempted to reach patient's primary provider unsuccessfully. Additionally the patient owns his home that, if sold would be able to provide income to pay for ALF. [name] stated she and her siblings would have to discuss that  because "The house is tied up." Patient get's approximately $1500 in social security. [name]  was previously provided a list of family care homes. She stated she was still researching the list.  [name] was previously provided a list of adult day programs that will provide care M-F 9-5. [name] is not able to locate anyone that would be able to provide care to patient after day programs hours. She has been advised patient's discharge plan needs to be finalized as he is ready to discharge and would not continue to meet acute hospital LOC.         Expected Discharge Plan and Services                                               Social Determinants of Health (SDOH) Interventions SDOH Screenings   Food Insecurity: No Food Insecurity (05/16/2022)  Housing: Low Risk  (05/16/2022)  Transportation Needs: No Transportation Needs (05/16/2022)  Utilities: Not At Risk (05/16/2022)  Tobacco Use: Low Risk  (05/18/2022)    Readmission Risk Interventions     No data to  display

## 2022-05-26 NOTE — Progress Notes (Signed)
Mobility Specialist - Progress Note   05/26/22 1521  Mobility  Activity Ambulated with assistance in hallway  Level of Assistance Standby assist, set-up cues, supervision of patient - no hands on  Assistive Device Front wheel walker  Distance Ambulated (ft) 480 ft  Activity Response Tolerated well  $Mobility charge 1 Mobility     Pt lying in bed upon arrival, utilizing RA. Pt agreeable to activity. Completed bed mobility modI. STS and ambulation with minG. VC for staying close/inside RW especially during turns. No LOB or complaints. Pt returned to bed with alarm set, needs in reach.    Filiberto Pinks Mobility Specialist 05/26/22, 3:22 PM

## 2022-05-26 NOTE — Progress Notes (Signed)
Occupational Therapy Treatment Patient Details Name: Spencer Coleman MRN: [id number] DOB: [birth year] Today's Date: 05/26/2022   History of present illness Pt is an 89 y.o. male with history of hypertension, hyperlipidemia, CKD, CHF, AICD, work up for acute on chronic systolic CHF exacerbation.   OT comments  Pt seen for OT tx. Pt pleasant, agreeable, and denies complaints. Pt required supervision for bed mobility, required MIN A for donning shoes and tying laces, and CGA-MIN A for STS with RW. Pt negotiated obstacles in the hallway >120' with RW, CGA, and PRN VC for safety. HR in 90's throughout. Pt continues to improve. Continue with POC.    Recommendations for follow up therapy are one component of a multi-disciplinary discharge planning process, led by the attending physician.  Recommendations may be updated based on patient status, additional functional criteria and insurance authorization.    Assistance Recommended at Discharge Intermittent Supervision/Assistance  Patient can return home with the following  A little help with bathing/dressing/bathroom;A little help with walking and/or transfers;Assistance with cooking/housework;Assist for transportation;Help with stairs or ramp for entrance   Equipment Recommendations  None recommended by OT    Recommendations for Other Services      Precautions / Restrictions Precautions Precautions: Fall Restrictions Weight Bearing Restrictions: No       Mobility Bed Mobility Overal bed mobility: Modified Independent                  Transfers Overall transfer level: Needs assistance Equipment used: Rolling walker (2 wheels) Transfers: Sit to/from Stand Sit to Stand: Min guard, Min assist                 Balance Overall balance assessment: Needs assistance Sitting-balance support: Feet supported Sitting balance-Leahy Scale: Good     Standing balance support: During functional activity, Bilateral upper extremity supported,  Reliant on assistive device for balance Standing balance-Leahy Scale: Fair                             ADL either performed or assessed with clinical judgement   ADL Overall ADL's : Needs assistance/impaired                     Lower Body Dressing: Sitting/lateral leans;Minimal assistance Lower Body Dressing Details (indicate cue type and reason): MIN A for adjusting shoes to ensure heel was fully in and to tie laces             Functional mobility during ADLs: Min guard;Rolling walker (2 wheels)      Extremity/Trunk Assessment              Vision       Perception     Praxis      Cognition Arousal/Alertness: Awake/alert Behavior During Therapy: WFL for tasks assessed/performed Overall Cognitive Status: Within Functional Limits for tasks assessed                                 General Comments: grossly WFL, enjoys sharing stories from his past (same as last session), follows commands well        Exercises      Shoulder Instructions       General Comments      Pertinent Vitals/ Pain       Pain Assessment Pain Assessment: No/denies pain  Home Living  Prior Functioning/Environment              Frequency  Min 1X/week        Progress Toward Goals  OT Goals(current goals can now be found in the care plan section)  Progress towards OT goals: Progressing toward goals  Acute Rehab OT Goals Patient Stated Goal: go home OT Goal Formulation: With patient Time For Goal Achievement: 05/28/22 Potential to Achieve Goals: Good  Plan Discharge plan remains appropriate;Frequency remains appropriate    Co-evaluation                 AM-PAC OT "6 Clicks" Daily Activity     Outcome Measure   Help from another person eating meals?: None Help from another person taking care of personal grooming?: A Little Help from another person toileting, which  includes using toliet, bedpan, or urinal?: A Little Help from another person bathing (including washing, rinsing, drying)?: A Little Help from another person to put on and taking off regular upper body clothing?: None Help from another person to put on and taking off regular lower body clothing?: A Little 6 Click Score: 20    End of Session Equipment Utilized During Treatment: Gait belt;Rolling walker (2 wheels)  OT Visit Diagnosis: Unsteadiness on feet (R26.81)   Activity Tolerance Patient tolerated treatment well   Patient Left in bed;with call bell/phone within reach;with bed alarm set   Nurse Communication          Time: 1610-9604 OT Time Calculation (min): 14 min  Charges: OT General Charges $OT Visit: 1 Visit OT Treatments $Self Care/Home Management : 8-22 mins  Arman Filter., MPH, MS, OTR/L ascom [phone] 05/26/22, 1:02 PM

## 2022-05-27 DIAGNOSIS — I472 Ventricular tachycardia, unspecified: Secondary | ICD-10-CM | POA: Diagnosis not present

## 2022-05-27 DIAGNOSIS — I480 Paroxysmal atrial fibrillation: Secondary | ICD-10-CM | POA: Diagnosis not present

## 2022-05-27 DIAGNOSIS — G9340 Encephalopathy, unspecified: Secondary | ICD-10-CM | POA: Diagnosis not present

## 2022-05-27 NOTE — Progress Notes (Addendum)
Mobility Specialist - Progress Note   05/27/22 1139  Mobility  Activity Ambulated with assistance in room;Transferred from bed to chair  Level of Assistance Standby assist, set-up cues, supervision of patient - no hands on  Assistive Device Front wheel walker  Distance Ambulated (ft) 30 ft  Activity Response Tolerated well  Mobility Referral Yes  $Mobility charge 1 Mobility     Pt received in bathroom on arrival, utilizing RA. Pt politely declined further ambulation at this time d/t recent ambulation, but agreeable to sitting in chair. Pt completed STS from standard toilet height with minA. Pt ambulated to recliner, noted antalgic gait this date. When asked pt if LE were hurting with activity, he responded "well yes and no, my R leg was broken a while ago". Still with some swelling in B feet with R > L. Pt left in recliner with sitter present at bedside.    Spencer Coleman Mobility Specialist 05/27/22, 11:45 AM

## 2022-05-27 NOTE — Care Management Important Message (Signed)
Important Message  Patient Details  Name: STEFEN JAQUAY MRN: [id number] Date of Birth: 1930/01/22   Medicare Important Message Given:  Yes     Johnell Comings 05/27/2022, 10:27 AM

## 2022-05-27 NOTE — Progress Notes (Signed)
PROGRESS NOTE    Spencer Coleman  ZOX:[id number] DOB: [birth year] DOA: 05/12/2022 PCP: Barbette Reichmann, MD    Assessment & Plan:   Principal Problem:   Acute hypoxic respiratory failure (HCC) Active Problems:   Acute on chronic systolic CHF (congestive heart failure) (HCC)   CAD (coronary artery disease)   PAF (paroxysmal atrial fibrillation) (HCC)   Automatic implantable cardioverter-defibrillator in situ   CKD (chronic kidney disease) stage 3, GFR 30-59 ml/min (HCC)   Hypotension  Assessment and Plan: Acute on chronic systolic CHF: continue on lasix. Monitor I/Os. Echo showed EF estimated at 20 to 25%, grade 2 diastolic dysfunction, moderate LVH.  BNP was 3,155.  Dilated cardiomyopathy: w/ hx of ventricular tachycardia, s/p AICD placement. Continue on metoprolol, amio. Cardio signed off 05/20/22   Acute hypoxic respiratory failure: resolved    PAF: continue on amio, metoprolol & eliquis     Hypotension: labile. Holding losartan.    Acute UTI: No fever or leukocytosis.  Completed abx course. Abxs were started based off of outpatient urine cx results    CKDIIIa: Cr is labile. Avoid nephrotoxic meds    Poor short term memory: no formal dx of dementia. Code stroke called on 05/16/2022 but CT head did not show any acute abnormality.  Altered mental status was attributed to Ativan at that time & has since been d/c. Continue on low dose seroquel    Thrombocytopenia: labile. Will monitor intermittently    Hx of falls at home: continue ambulation w/ mobility specialist       DVT prophylaxis: eliquis  Code Status: DNR Family Communication: called pt's daughter, [name], and no answer Disposition Plan: unsafe d/c plan.   Level of care: Progressive Status is: Inpatient Remains inpatient appropriate because:  Medically stable. Unsafe d/c plan   Consultants:  cardio  Procedures:   Antimicrobials:    Subjective: Pt c/o fatigue   Objective: Vitals:   05/26/22 1959 05/26/22  2308 05/27/22 0343 05/27/22 0350  BP: 102/64 95/61 102/62   Pulse: 68 71 75   Resp: 18 16 17    Temp: 98 F (36.7 C) 98.2 F (36.8 C) 97.6 F (36.4 C)   TempSrc: Oral Oral    SpO2: 93% 94% 95%   Weight:    62.7 kg  Height:        Intake/Output Summary (Last 24 hours) at 05/27/2022 0814 Last data filed at 05/26/2022 2130 Gross per 24 hour  Intake 918 ml  Output --  Net 918 ml   Filed Weights   05/25/22 0554 05/26/22 0337 05/27/22 0350  Weight: 65.3 kg 64.9 kg 62.7 kg    Examination:  General exam: Appears calm & comfortable  Respiratory system: clear breath sounds b/l  Cardiovascular system: S1 & S2+. No rubs or clicks   Gastrointestinal system: abd is soft, NT, ND & normal bowel sounds   Central nervous system: alert & oriented x 3 but not to situation  Psychiatry: judgement and insight appears at baseline. Appropriate mood and affect     Data Reviewed: I have personally reviewed following labs and imaging studies  CBC: Recent Labs  Lab 05/21/22 0433 05/22/22 0452 05/23/22 0553  WBC 6.0 6.1 5.9  HGB 13.1 12.4* 12.7*  HCT 41.3 39.2 40.9  MCV 93.9 93.3 94.0  PLT 128* 126* 126*   Basic Metabolic Panel: Recent Labs  Lab 05/21/22 0433 05/22/22 0452 05/23/22 0553  NA 136 136 139  K 4.2 4.2 4.1  CL 100 99 99  CO2 30  29 31  GLUCOSE 93 101* 101*  BUN 40* 44* 42*  CREATININE 1.52* 1.78* 1.58*  CALCIUM 8.9 8.7* 8.8*  MG 2.3 2.2 2.4   GFR: Estimated Creatinine Clearance: 28.1 mL/min (A) (by C-G formula based on SCr of 1.58 mg/dL (H)). Liver Function Tests: No results for input(s): "AST", "ALT", "ALKPHOS", "BILITOT", "PROT", "ALBUMIN" in the last 168 hours. No results for input(s): "LIPASE", "AMYLASE" in the last 168 hours. No results for input(s): "AMMONIA" in the last 168 hours. Coagulation Profile: No results for input(s): "INR", "PROTIME" in the last 168 hours. Cardiac Enzymes: No results for input(s): "CKTOTAL", "CKMB", "CKMBINDEX", "TROPONINI" in the  last 168 hours. BNP (last 3 results) No results for input(s): "PROBNP" in the last 8760 hours. HbA1C: No results for input(s): "HGBA1C" in the last 72 hours. CBG: No results for input(s): "GLUCAP" in the last 168 hours. Lipid Profile: No results for input(s): "CHOL", "HDL", "LDLCALC", "TRIG", "CHOLHDL", "LDLDIRECT" in the last 72 hours. Thyroid Function Tests: No results for input(s): "TSH", "T4TOTAL", "FREET4", "T3FREE", "THYROIDAB" in the last 72 hours. Anemia Panel: No results for input(s): "VITAMINB12", "FOLATE", "FERRITIN", "TIBC", "IRON", "RETICCTPCT" in the last 72 hours. Sepsis Labs: No results for input(s): "PROCALCITON", "LATICACIDVEN" in the last 168 hours.  No results found for this or any previous visit (from the past 240 hour(s)).       Radiology Studies: No results found.      Scheduled Meds:  amiodarone  400 mg Oral BID   Followed by   Melene Muller ON 05/28/2022] amiodarone  200 mg Oral Daily   apixaban  2.5 mg Oral BID   furosemide  40 mg Oral Daily   metoprolol succinate  12.5 mg Oral Daily   QUEtiapine  12.5 mg Oral QHS   simvastatin  40 mg Oral q1800   Continuous Infusions:  sodium chloride       LOS: 15 days    Time spent: 15 mins     Charise Killian, MD [facility] Pager [phone]  If 7PM-7AM, please contact night-coverage [email or web address] 05/27/2022, 8:14 AM

## 2022-05-27 NOTE — Plan of Care (Signed)

## 2022-05-28 DIAGNOSIS — G9340 Encephalopathy, unspecified: Secondary | ICD-10-CM | POA: Diagnosis not present

## 2022-05-28 DIAGNOSIS — I480 Paroxysmal atrial fibrillation: Secondary | ICD-10-CM | POA: Diagnosis not present

## 2022-05-28 DIAGNOSIS — I472 Ventricular tachycardia, unspecified: Secondary | ICD-10-CM | POA: Diagnosis not present

## 2022-05-28 NOTE — Progress Notes (Signed)
PROGRESS NOTE    Spencer Coleman  ZOX:[id number] DOB: 1930/11/08 DOA: 05/12/2022 PCP: Spencer Reichmann, MD    Assessment & Plan:   Principal Problem:   Acute hypoxic respiratory failure (HCC) Active Problems:   Acute on chronic systolic CHF (congestive heart failure) (HCC)   CAD (coronary artery disease)   PAF (paroxysmal atrial fibrillation) (HCC)   Automatic implantable cardioverter-defibrillator in situ   CKD (chronic kidney disease) stage 3, GFR 30-59 ml/min (HCC)   Hypotension  Assessment and Plan: Acute on chronic systolic CHF: continue on lasix. Monitor I/Os. Echo showed EF estimated at 20 to 25%, grade 2 diastolic dysfunction, moderate LVH.  BNP was 3,155.  Dilated cardiomyopathy: w/ hx of ventricular tachycardia, s/p AICD placement. Continue on amio, metoprolol. Cardio signed off 05/20/22   Acute hypoxic respiratory failure: resolved    PAF: continue on amio, metoprolol, eliquis     Hypotension: resolved    Acute UTI: No fever or leukocytosis.  Completed abx course. Abxs were started based off of outpatient urine cx results    CKDIIIa: Cr is labile. Avoid nephrotoxic meds    Poor short term memory: no formal dx of dementia. Code stroke called on 05/16/2022 but CT head did not show any acute abnormality.  Altered mental status was attributed to Ativan at that time & has since been d/c. Continue on low dose seroquel   Thrombocytopenia: labile. Will monitor intermittently     Hx of falls at home: continue ambulation w/ mobility specialist       DVT prophylaxis: eliquis  Code Status: DNR Family Communication: called pt's daughter, Spencer Coleman, and no answer so I left a voicemail  Disposition Plan: unsafe d/c plan.   Level of care: Progressive Status is: Inpatient Remains inpatient appropriate because:  Medically stable. Unsafe d/c plan   Consultants:  cardio  Procedures:   Antimicrobials:    Subjective: Pt denies any complaints   Objective: Vitals:    05/27/22 1605 05/27/22 2015 05/27/22 2354 05/28/22 0544  BP: 97/61 122/73 96/64 (!) 99/51  Pulse: 71     Resp: 18     Temp: 98 F (36.7 C) 97.9 F (36.6 C) 98 F (36.7 C) 97.9 F (36.6 C)  TempSrc: Oral Oral Oral Oral  SpO2: 99%     Weight:    61.6 kg  Height:        Intake/Output Summary (Last 24 hours) at 05/28/2022 0813 Last data filed at 05/27/2022 2355 Gross per 24 hour  Intake 30 ml  Output --  Net 30 ml   Filed Weights   05/26/22 0337 05/27/22 0350 05/28/22 0544  Weight: 64.9 kg 62.7 kg 61.6 kg    Examination:  General exam: Appears comfortable  Respiratory system: clear breath sounds b/l  Cardiovascular system: S1/S2+. No rubs or clicks  Gastrointestinal system: abd is soft, NT, ND & hypoactive bowel sounds Central nervous system: alert & oriented x 3 but not to situation  Psychiatry: judgement and insight appears at baseline. Appropriate mood and affect     Data Reviewed: I have personally reviewed following labs and imaging studies  CBC: Recent Labs  Lab 05/22/22 0452 05/23/22 0553  WBC 6.1 5.9  HGB 12.4* 12.7*  HCT 39.2 40.9  MCV 93.3 94.0  PLT 126* 126*   Basic Metabolic Panel: Recent Labs  Lab 05/22/22 0452 05/23/22 0553  NA 136 139  K 4.2 4.1  CL 99 99  CO2 29 31  GLUCOSE 101* 101*  BUN 44* 42*  CREATININE  1.78* 1.58*  CALCIUM 8.7* 8.8*  MG 2.2 2.4   GFR: Estimated Creatinine Clearance: 27.6 mL/min (A) (by C-G formula based on SCr of 1.58 mg/dL (H)). Liver Function Tests: No results for input(s): "AST", "ALT", "ALKPHOS", "BILITOT", "PROT", "ALBUMIN" in the last 168 hours. No results for input(s): "LIPASE", "AMYLASE" in the last 168 hours. No results for input(s): "AMMONIA" in the last 168 hours. Coagulation Profile: No results for input(s): "INR", "PROTIME" in the last 168 hours. Cardiac Enzymes: No results for input(s): "CKTOTAL", "CKMB", "CKMBINDEX", "TROPONINI" in the last 168 hours. BNP (last 3 results) No results for  input(s): "PROBNP" in the last 8760 hours. HbA1C: No results for input(s): "HGBA1C" in the last 72 hours. CBG: No results for input(s): "GLUCAP" in the last 168 hours. Lipid Profile: No results for input(s): "CHOL", "HDL", "LDLCALC", "TRIG", "CHOLHDL", "LDLDIRECT" in the last 72 hours. Thyroid Function Tests: No results for input(s): "TSH", "T4TOTAL", "FREET4", "T3FREE", "THYROIDAB" in the last 72 hours. Anemia Panel: No results for input(s): "VITAMINB12", "FOLATE", "FERRITIN", "TIBC", "IRON", "RETICCTPCT" in the last 72 hours. Sepsis Labs: No results for input(s): "PROCALCITON", "LATICACIDVEN" in the last 168 hours.  No results found for this or any previous visit (from the past 240 hour(s)).       Radiology Studies: No results found.      Scheduled Meds:  amiodarone  200 mg Oral Daily   apixaban  2.5 mg Oral BID   furosemide  40 mg Oral Daily   metoprolol succinate  12.5 mg Oral Daily   QUEtiapine  12.5 mg Oral QHS   simvastatin  40 mg Oral q1800   Continuous Infusions:  sodium chloride       LOS: 16 days    Time spent: 15 mins     Charise Killian, MD [facility] Pager [phone]  If 7PM-7AM, please contact night-coverage [email or web address] 05/28/2022, 8:13 AM

## 2022-05-29 DIAGNOSIS — I480 Paroxysmal atrial fibrillation: Secondary | ICD-10-CM | POA: Diagnosis not present

## 2022-05-29 DIAGNOSIS — I472 Ventricular tachycardia, unspecified: Secondary | ICD-10-CM | POA: Diagnosis not present

## 2022-05-29 DIAGNOSIS — G9340 Encephalopathy, unspecified: Secondary | ICD-10-CM | POA: Diagnosis not present

## 2022-05-29 NOTE — Progress Notes (Signed)
Physical Therapy Treatment Patient Details Name: Spencer Coleman MRN: [id number] DOB: 10/13/1930 Today's Date: 05/29/2022   History of Present Illness Pt is an 89 y.o. male with history of hypertension, hyperlipidemia, CKD, CHF, AICD, work up for acute on chronic systolic CHF exacerbation.    PT Comments    Pt ready for session.  Bed mobility without assist.  He is able to don shoes without LOB in sitting reaching forward to floor.  He is able to stand and walk x 2 laps on unit with RW and min guard.  Some decreased quality as he fatigues but does seem aware at times to pull walker closer to him and does well navigating in his room.  Seems to use a rollator at home and prefers it "It has 4 wheels.".  Recommend +1 assist for safety with mobility.   Recommendations for follow up therapy are one component of a multi-disciplinary discharge planning process, led by the attending physician.  Recommendations may be updated based on patient status, additional functional criteria and insurance authorization.  Follow Up Recommendations       Assistance Recommended at Discharge Frequent or constant Supervision/Assistance  Patient can return home with the following Assist for transportation;Direct supervision/assist for medications management;A little help with walking and/or transfers;A little help with bathing/dressing/bathroom;Assistance with cooking/housework   Equipment Recommendations  BSC/3in1    Recommendations for Other Services       Precautions / Restrictions Precautions Precautions: Fall Restrictions Weight Bearing Restrictions: No     Mobility  Bed Mobility Overal bed mobility: Modified Independent                  Transfers Overall transfer level: Needs assistance Equipment used: Rolling walker (2 wheels) Transfers: Sit to/from Stand Sit to Stand: Min guard                Ambulation/Gait Ambulation/Gait assistance: Min guard Gait Distance (Feet): 300  Feet Assistive device: Rolling walker (2 wheels) Gait Pattern/deviations: Step-through pattern, Decreased step length - right, Decreased step length - left, Trunk flexed Gait velocity: decreased     General Gait Details: some decreased gait quality as he fatigues but overall does well.   Stairs             Wheelchair Mobility    Modified Rankin (Stroke Patients Only)       Balance Overall balance assessment: Needs assistance Sitting-balance support: Feet supported Sitting balance-Leahy Scale: Normal Sitting balance - Comments: able to don shoes without LOB   Standing balance support: During functional activity, Bilateral upper extremity supported, Reliant on assistive device for balance Standing balance-Leahy Scale: Fair                              Cognition Arousal/Alertness: Awake/alert Behavior During Therapy: WFL for tasks assessed/performed Overall Cognitive Status: Within Functional Limits for tasks assessed                                 General Comments: grossly WFL, enjoys sharing stories from his past (same as last session), follows commands well        Exercises      General Comments        Pertinent Vitals/Pain Pain Assessment Pain Assessment: No/denies pain    Home Living  Prior Function            PT Goals (current goals can now be found in the care plan section) Progress towards PT goals: Progressing toward goals    Frequency    Min 2X/week      PT Plan Current plan remains appropriate    Co-evaluation              AM-PAC PT "6 Clicks" Mobility   Outcome Measure  Help needed turning from your back to your side while in a flat bed without using bedrails?: None Help needed moving from lying on your back to sitting on the side of a flat bed without using bedrails?: None Help needed moving to and from a bed to a chair (including a wheelchair)?: A  Little Help needed standing up from a chair using your arms (e.g., wheelchair or bedside chair)?: A Little Help needed to walk in hospital room?: A Little Help needed climbing 3-5 steps with a railing? : A Little 6 Click Score: 20    End of Session Equipment Utilized During Treatment: Gait belt Activity Tolerance: Patient tolerated treatment well Patient left: in bed;with call bell/phone within reach;with bed alarm set Nurse Communication: Mobility status PT Visit Diagnosis: Other abnormalities of gait and mobility (R26.89);Muscle weakness (generalized) (M62.81)     Time: 1610-9604 PT Time Calculation (min) (ACUTE ONLY): 12 min  Charges:  $Gait Training: 8-22 mins                    Danielle Dess, PTA 05/29/22, 12:03 PM

## 2022-05-29 NOTE — Progress Notes (Signed)
PROGRESS NOTE    Spencer Coleman  ZOX:[id number] DOB: 06-22-1930 DOA: 05/12/2022 PCP: Barbette Reichmann, MD    Assessment & Plan:   Principal Problem:   Acute hypoxic respiratory failure (HCC) Active Problems:   Acute on chronic systolic CHF (congestive heart failure) (HCC)   CAD (coronary artery disease)   PAF (paroxysmal atrial fibrillation) (HCC)   Automatic implantable cardioverter-defibrillator in situ   CKD (chronic kidney disease) stage 3, GFR 30-59 ml/min (HCC)   Hypotension  Assessment and Plan: Acute on chronic systolic CHF: continue on lasix. Monitor I/Os. Echo showed EF estimated at 20 to 25%, grade 2 diastolic dysfunction, moderate LVH.  BNP was 3,155.  Dilated cardiomyopathy: w/ hx of ventricular tachycardia, s/p AICD placement. Continue on metoprolol, amio. Cardio signed off 05/20/22   Acute hypoxic respiratory failure: resolved    PAF: continue on eliquis, metoprolol, amiodarone     Hypotension: resolved    Acute UTI: completed abx course. Abxs were started based off of outpatient urine cx results    CKDIIIa: Cr is labile. Avoid nephrotoxic meds    Poor short term memory: no formal dx of dementia. Code stroke called on 05/16/2022 but CT head did not show any acute abnormality.  Altered mental status was attributed to Ativan at that time & has since been d/c. Continue on low dose seroquel   Thrombocytopenia: labile. Will monitor intermittently     Hx of falls at home: continue ambulation w/ mobility specialist       DVT prophylaxis: eliquis  Code Status: DNR Family Communication: discussed pt's care w/ pt's daughter, Spencer Coleman, and answered her questions. Spencer Coleman maybe able to set up a temporary safe d/c plan for the pt but needs another day Disposition Plan: unsafe d/c plan.   Level of care: Progressive Status is: Inpatient Remains inpatient appropriate because:  Medically stable. Unsafe d/c plan   Consultants:  cardio  Procedures:   Antimicrobials:     Subjective: Pt c/o wanting to go home   Objective: Vitals:   05/29/22 0100 05/29/22 0200 05/29/22 0300 05/29/22 0449  BP: 107/69 107/63 92/60   Pulse: 69 69 70   Resp: 15 15 14    Temp:      TempSrc:      SpO2: 97% 95% 97%   Weight:    62 kg  Height:        Intake/Output Summary (Last 24 hours) at 05/29/2022 0753 Last data filed at 05/28/2022 1921 Gross per 24 hour  Intake 660 ml  Output --  Net 660 ml   Filed Weights   05/27/22 0350 05/28/22 0544 05/29/22 0449  Weight: 62.7 kg 61.6 kg 62 kg    Examination:  General exam: Appears calm & comfortable  Respiratory system: clear breath sounds b/l  Cardiovascular system: S1 & S2+. No rubs or clicks   Gastrointestinal system: abd is soft, NT, ND & hypoactive bowel sounds  Central nervous system: alert & oriented x 3 but not to situation  Psychiatry: judgement and insight appears at baseline. Appropriate mood and affect     Data Reviewed: I have personally reviewed following labs and imaging studies  CBC: Recent Labs  Lab 05/23/22 0553  WBC 5.9  HGB 12.7*  HCT 40.9  MCV 94.0  PLT 126*   Basic Metabolic Panel: Recent Labs  Lab 05/23/22 0553  NA 139  K 4.1  CL 99  CO2 31  GLUCOSE 101*  BUN 42*  CREATININE 1.58*  CALCIUM 8.8*  MG 2.4  GFR: Estimated Creatinine Clearance: 27.8 mL/min (A) (by C-G formula based on SCr of 1.58 mg/dL (H)). Liver Function Tests: No results for input(s): "AST", "ALT", "ALKPHOS", "BILITOT", "PROT", "ALBUMIN" in the last 168 hours. No results for input(s): "LIPASE", "AMYLASE" in the last 168 hours. No results for input(s): "AMMONIA" in the last 168 hours. Coagulation Profile: No results for input(s): "INR", "PROTIME" in the last 168 hours. Cardiac Enzymes: No results for input(s): "CKTOTAL", "CKMB", "CKMBINDEX", "TROPONINI" in the last 168 hours. BNP (last 3 results) No results for input(s): "PROBNP" in the last 8760 hours. HbA1C: No results for input(s): "HGBA1C" in the  last 72 hours. CBG: No results for input(s): "GLUCAP" in the last 168 hours. Lipid Profile: No results for input(s): "CHOL", "HDL", "LDLCALC", "TRIG", "CHOLHDL", "LDLDIRECT" in the last 72 hours. Thyroid Function Tests: No results for input(s): "TSH", "T4TOTAL", "FREET4", "T3FREE", "THYROIDAB" in the last 72 hours. Anemia Panel: No results for input(s): "VITAMINB12", "FOLATE", "FERRITIN", "TIBC", "IRON", "RETICCTPCT" in the last 72 hours. Sepsis Labs: No results for input(s): "PROCALCITON", "LATICACIDVEN" in the last 168 hours.  No results found for this or any previous visit (from the past 240 hour(s)).       Radiology Studies: No results found.      Scheduled Meds:  amiodarone  200 mg Oral Daily   apixaban  2.5 mg Oral BID   furosemide  40 mg Oral Daily   metoprolol succinate  12.5 mg Oral Daily   QUEtiapine  12.5 mg Oral QHS   simvastatin  40 mg Oral q1800   Continuous Infusions:  sodium chloride       LOS: 17 days    Time spent: 15 mins     Spencer Killian, MD [facility] Pager [phone]  If 7PM-7AM, please contact night-coverage [email or web address] 05/29/2022, 7:53 AM

## 2022-05-30 ENCOUNTER — Encounter: Payer: Medicare HMO | Admitting: Family

## 2022-05-30 LAB — CBC
HCT: 40.6 % (ref 39.0–52.0)
Hemoglobin: 12.9 g/dL — ABNORMAL LOW (ref 13.0–17.0)
MCH: 29.7 pg (ref 26.0–34.0)
MCHC: 31.8 g/dL (ref 30.0–36.0)
MCV: 93.5 fL (ref 80.0–100.0)
Platelets: 155 10*3/uL (ref 150–400)
RBC: 4.34 MIL/uL (ref 4.22–5.81)
RDW: 14.7 % (ref 11.5–15.5)
WBC: 6.3 10*3/uL (ref 4.0–10.5)
nRBC: 0 % (ref 0.0–0.2)

## 2022-05-30 LAB — BASIC METABOLIC PANEL
Anion gap: 8 (ref 5–15)
BUN: 45 mg/dL — ABNORMAL HIGH (ref 8–23)
CO2: 29 mmol/L (ref 22–32)
Calcium: 8.9 mg/dL (ref 8.9–10.3)
Chloride: 100 mmol/L (ref 98–111)
Creatinine, Ser: 1.9 mg/dL — ABNORMAL HIGH (ref 0.61–1.24)
GFR, Estimated: 33 mL/min — ABNORMAL LOW (ref >60)
Glucose, Bld: 96 mg/dL (ref 70–99)
Potassium: 4.2 mmol/L (ref 3.5–5.1)
Sodium: 137 mmol/L (ref 135–145)

## 2022-05-30 NOTE — Care Management Important Message (Signed)
Important Message  Patient Details  Name: Spencer Coleman MRN: [id number] Date of Birth: 1930-04-25   Medicare Important Message Given:  Yes     Johnell Comings 05/30/2022, 11:35 AM

## 2022-05-30 NOTE — Progress Notes (Signed)
PROGRESS NOTE    Spencer Coleman  ZOX:[id number] DOB: 06-25-1930 DOA: 05/12/2022 PCP: Barbette Reichmann, MD    Assessment & Plan:   Principal Problem:   Acute hypoxic respiratory failure (HCC) Active Problems:   Acute on chronic systolic CHF (congestive heart failure) (HCC)   CAD (coronary artery disease)   PAF (paroxysmal atrial fibrillation) (HCC)   Automatic implantable cardioverter-defibrillator in situ   CKD (chronic kidney disease) stage 3, GFR 30-59 ml/min (HCC)   Hypotension  Assessment and Plan: Acute on chronic systolic CHF: continue on lasix. Monitor I/Os. Echo showed EF estimated at 20 to 25%, grade 2 diastolic dysfunction, moderate LVH.  BNP was 3,155.  Dilated cardiomyopathy: w/ hx of ventricular tachycardia, s/p AICD placement. Continue on amio, metoprolol. Cardio signed off 05/20/22   Acute hypoxic respiratory failure: resolved    PAF: continue on amio, metoprolol, & eliquis     Hypotension: resolved    Acute UTI: completed abx course. Abxs were started based off of outpatient urine cx results    CKDIIIa: Cr is labile. Avoid nephrotoxic meds    Poor short term memory: no formal dx of dementia. Code stroke called on 05/16/2022 but CT head did not show any acute abnormality.  Altered mental status was attributed to Ativan at that time & has since been d/c. Continue on low dose seroquel   Thrombocytopenia: resolved    Hx of falls at home: continue ambulation w/ mobility specialist       DVT prophylaxis: eliquis  Code Status: DNR Family Communication: called pt's daughter, [name], to f/u on our conversation on 05/29/22 but no answer so I left a voicemail  Disposition Plan: unsafe d/c plan.   Level of care: Progressive Status is: Inpatient Remains inpatient appropriate because:  Medically stable. Unsafe d/c plan   Consultants:  cardio  Procedures:   Antimicrobials:    Subjective: Pt denies any complaints   Objective: Vitals:   05/30/22 0300 05/30/22  0403 05/30/22 0859 05/30/22 1256  BP:  (!) 90/58 108/72 106/67  Pulse: 69 70 80 71  Resp: 16 18 16 18   Temp:  98.7 F (37.1 C) 97.9 F (36.6 C) (!) 97.4 F (36.3 C)  TempSrc:  Oral Oral   SpO2: 95% 95% 98% 99%  Weight:  61.7 kg    Height:        Intake/Output Summary (Last 24 hours) at 05/30/2022 1328 Last data filed at 05/30/2022 1032 Gross per 24 hour  Intake 100 ml  Output 200 ml  Net -100 ml    Filed Weights   05/28/22 0544 05/29/22 0449 05/30/22 0403  Weight: 61.6 kg 62 kg 61.7 kg    Examination:  General exam: Appears comfortable  Respiratory system: clear breath sounds b/l  Cardiovascular system: S1/S2+. No rubs or clicks  Gastrointestinal system: abd is soft, NT, ND & normal bowel sounds   Central nervous system: alert & oriented x 3 but not to situation  Psychiatry: judgement and insight appears at baseline. Appropriate mood and affect     Data Reviewed: I have personally reviewed following labs and imaging studies  CBC: Recent Labs  Lab 05/30/22 0514  WBC 6.3  HGB 12.9*  HCT 40.6  MCV 93.5  PLT 155   Basic Metabolic Panel: Recent Labs  Lab 05/30/22 0514  NA 137  K 4.2  CL 100  CO2 29  GLUCOSE 96  BUN 45*  CREATININE 1.90*  CALCIUM 8.9   GFR: Estimated Creatinine Clearance: 23 mL/min (A) (by  C-G formula based on SCr of 1.9 mg/dL (H)). Liver Function Tests: No results for input(s): "AST", "ALT", "ALKPHOS", "BILITOT", "PROT", "ALBUMIN" in the last 168 hours. No results for input(s): "LIPASE", "AMYLASE" in the last 168 hours. No results for input(s): "AMMONIA" in the last 168 hours. Coagulation Profile: No results for input(s): "INR", "PROTIME" in the last 168 hours. Cardiac Enzymes: No results for input(s): "CKTOTAL", "CKMB", "CKMBINDEX", "TROPONINI" in the last 168 hours. BNP (last 3 results) No results for input(s): "PROBNP" in the last 8760 hours. HbA1C: No results for input(s): "HGBA1C" in the last 72 hours. CBG: No results for  input(s): "GLUCAP" in the last 168 hours. Lipid Profile: No results for input(s): "CHOL", "HDL", "LDLCALC", "TRIG", "CHOLHDL", "LDLDIRECT" in the last 72 hours. Thyroid Function Tests: No results for input(s): "TSH", "T4TOTAL", "FREET4", "T3FREE", "THYROIDAB" in the last 72 hours. Anemia Panel: No results for input(s): "VITAMINB12", "FOLATE", "FERRITIN", "TIBC", "IRON", "RETICCTPCT" in the last 72 hours. Sepsis Labs: No results for input(s): "PROCALCITON", "LATICACIDVEN" in the last 168 hours.  No results found for this or any previous visit (from the past 240 hour(s)).       Radiology Studies: No results found.      Scheduled Meds:  amiodarone  200 mg Oral Daily   apixaban  2.5 mg Oral BID   furosemide  40 mg Oral Daily   metoprolol succinate  12.5 mg Oral Daily   QUEtiapine  12.5 mg Oral QHS   simvastatin  40 mg Oral q1800   Continuous Infusions:  sodium chloride       LOS: 18 days    Time spent: 15 mins     Charise Killian, MD [facility] Pager [phone]  If 7PM-7AM, please contact night-coverage [email or web address] 05/30/2022, 1:28 PM

## 2022-05-30 NOTE — Evaluation (Signed)
Occupational Therapy Re-Evaluation Patient Details Name: Spencer Coleman MRN: [id number] DOB: August 23, 1930 Today's Date: 05/30/2022   History of Present Illness Pt is an 89 y.o. male with history of hypertension, hyperlipidemia, CKD, CHF, AICD, work up for acute on chronic systolic CHF exacerbation.   Clinical Impression   Pt seen for OT re-evaluation this date. Pt sleeping lightly, wakes to OT voice and agreeable to session. Pt denies complaints, oriented to self and place and somewhat to situation. Pt completed bed mobility with supervision, stood with SBA, and ambulated in room with CGA + RW. Pt doffed socks seated EOB without assist after attempting to don shoes wiht grippy socks leading to issues wiht getting his heel in the shoes. Without socks, pt able to don shoes properly and just required assist for tying due to having pulse ox sensory on L index finger. Pt demonstrating decreased problem solving and safety awareness leading to a high risk of falls. Pt continues to benefit from skilled OT services to maximize return to PLOF and minimize falls risk. OT goals reviewed and updated as appropriate.      Recommendations for follow up therapy are one component of a multi-disciplinary discharge planning process, led by the attending physician.  Recommendations may be updated based on patient status, additional functional criteria and insurance authorization.   Assistance Recommended at Discharge Frequent or constant Supervision/Assistance  Patient can return home with the following A little help with bathing/dressing/bathroom;A little help with walking and/or transfers;Assistance with cooking/housework;Assist for transportation;Help with stairs or ramp for entrance;Direct supervision/assist for medications management;Direct supervision/assist for financial management    Functional Status Assessment  Patient has had a recent decline in their functional status and demonstrates the ability to make  significant improvements in function in a reasonable and predictable amount of time.  Equipment Recommendations  None recommended by OT    Recommendations for Other Services       Precautions / Restrictions Precautions Precautions: Fall Restrictions Weight Bearing Restrictions: No      Mobility Bed Mobility Overal bed mobility: Modified Independent                  Transfers Overall transfer level: Needs assistance Equipment used: Rolling walker (2 wheels) Transfers: Sit to/from Stand Sit to Stand: Supervision                  Balance Overall balance assessment: Needs assistance Sitting-balance support: Feet supported Sitting balance-Leahy Scale: Normal     Standing balance support: During functional activity, Bilateral upper extremity supported, Reliant on assistive device for balance Standing balance-Leahy Scale: Fair                             ADL either performed or assessed with clinical judgement   ADL Overall ADL's : Needs assistance/impaired                     Lower Body Dressing: Sitting/lateral leans;Minimal assistance Lower Body Dressing Details (indicate cue type and reason): Pt doffs socks seated EOB wihtout assist after attempting to don shoes wiht grippy socks leading to issues wiht getting his heel in the shoes. Without socks, pt able to don shoes properly and just required assist for tying due to having pulse ox sensory on L index finger. Toilet Transfer: BSC/3in1;Rolling walker (2 wheels);Min guard;Ambulation                   Vision  Perception     Praxis      Pertinent Vitals/Pain Pain Assessment Pain Assessment: No/denies pain     Hand Dominance Right   Extremity/Trunk Assessment Upper Extremity Assessment Upper Extremity Assessment: Generalized weakness   Lower Extremity Assessment Lower Extremity Assessment: Generalized weakness   Cervical / Trunk Assessment Cervical / Trunk  Assessment: Normal   Communication Communication Communication: No difficulties   Cognition Arousal/Alertness: Awake/alert Behavior During Therapy: WFL for tasks assessed/performed Overall Cognitive Status: No family/caregiver present to determine baseline cognitive functioning                                 General Comments: Pt pleasant, follows commands, a little confused about situation but easily reoriented     General Comments       Exercises Other Exercises Other Exercises: With encouragement, pt selected something from the menu and then with VC to initiate calling, pt called dining services to order meal. Pt knew where to look on whiteboard for his room number but unable to make it out requiring VC.   Shoulder Instructions      Home Living Family/patient expects to be discharged to:: Private residence Living Arrangements: Alone Available Help at Discharge: Family;Available PRN/intermittently (sister; pt states he has two local sons) Type of Home: House Home Access: Stairs to enter [employer] of Steps: 1 Entrance Stairs-Rails: Can reach both Home Layout: One level     Bathroom Shower/Tub: Producer, television/film/video: Handicapped height     Home Equipment: Grab bars - toilet;Rollator (4 wheels)          Prior Functioning/Environment Prior Level of Function : Independent/Modified Independent             Mobility Comments: uses rollator. Pt denies falls. ADLs Comments: Pt states he is (I) with ADLs/IADLs, drives, grocery shops. Past medical record shows family with concerns about this. 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        OT Problem List: Impaired balance (sitting and/or standing);Decreased activity tolerance;Decreased cognition;Decreased safety awareness      OT Treatment/Interventions: Self-care/ADL training;Therapeutic exercise;Therapeutic activities;Cognitive remediation/compensation;DME and/or AE  instruction;Patient/family education;Balance training    OT Goals(Current goals can be found in the care plan section) Acute Rehab OT Goals Patient Stated Goal: go home OT Goal Formulation: With patient Time For Goal Achievement: 06/13/22 Potential to Achieve Goals: Good  OT Frequency: Min 1X/week    Co-evaluation              AM-PAC OT "6 Clicks" Daily Activity     Outcome Measure Help from another person eating meals?: None Help from another person taking care of personal grooming?: A Little Help from another person toileting, which includes using toliet, bedpan, or urinal?: A Little Help from another person bathing (including washing, rinsing, drying)?: A Little Help from another person to put on and taking off regular upper body clothing?: None Help from another person to put on and taking off regular lower body clothing?: A Little 6 Click Score: 20   End of Session Equipment Utilized During Treatment: Rolling walker (2 wheels)  Activity Tolerance: Patient tolerated treatment well Patient left: in chair;with call bell/phone within reach;with chair alarm set  OT Visit Diagnosis: Unsteadiness on feet (R26.81)                Time: 1660-6301 OT Time Calculation (min): 18 min Charges:  OT General Charges $OT  Visit: 1 Visit OT Evaluation $OT Re-eval: 1 Re-eval OT Treatments $Self Care/Home Management : 8-22 mins  Arman Filter., MPH, MS, OTR/L ascom [phone] 05/30/22, 4:31 PM

## 2022-05-30 NOTE — Progress Notes (Signed)
Physical Therapy Treatment Patient Details Name: Spencer Coleman MRN: [id number] DOB: 1930-04-22 Today's Date: 05/30/2022   History of Present Illness Pt is an 89 y.o. male with history of hypertension, hyperlipidemia, CKD, CHF, AICD, work up for acute on chronic systolic CHF exacerbation.    PT Comments    In chair.  Assisted to do shoes but has difficulty getting them to fit right and overall poor awareness when heel is sliding out of his shoe.  He is able to walk x 2 laps on unit with RW and min guard.  Opts to return to bed after session.   Recommendations for follow up therapy are one component of a multi-disciplinary discharge planning process, led by the attending physician.  Recommendations may be updated based on patient status, additional functional criteria and insurance authorization.  Follow Up Recommendations       Assistance Recommended at Discharge Frequent or constant Supervision/Assistance  Patient can return home with the following Assist for transportation;Direct supervision/assist for medications management;A little help with walking and/or transfers;A little help with bathing/dressing/bathroom;Assistance with cooking/housework   Equipment Recommendations  BSC/3in1    Recommendations for Other Services       Precautions / Restrictions Precautions Precautions: Fall Restrictions Weight Bearing Restrictions: No     Mobility  Bed Mobility Overal bed mobility: Modified Independent               Patient Response: Cooperative  Transfers Overall transfer level: Needs assistance Equipment used: Rolling walker (2 wheels) Transfers: Sit to/from Stand Sit to Stand: Supervision                Ambulation/Gait Ambulation/Gait assistance: Min guard Gait Distance (Feet): 300 Feet Assistive device: Rolling walker (2 wheels)   Gait velocity: decreased     General Gait Details: poor awareness of shoes not being on right and cues needed   Stairs              Wheelchair Mobility    Modified Rankin (Stroke Patients Only)       Balance Overall balance assessment: Needs assistance Sitting-balance support: Feet supported Sitting balance-Leahy Scale: Normal     Standing balance support: During functional activity, Bilateral upper extremity supported, Reliant on assistive device for balance Standing balance-Leahy Scale: Fair                              Cognition Arousal/Alertness: Awake/alert Behavior During Therapy: WFL for tasks assessed/performed Overall Cognitive Status: Within Functional Limits for tasks assessed                                          Exercises      General Comments        Pertinent Vitals/Pain Pain Assessment Pain Assessment: No/denies pain    Home Living                          Prior Function            PT Goals (current goals can now be found in the care plan section) Progress towards PT goals: Progressing toward goals    Frequency    Min 2X/week      PT Plan Current plan remains appropriate    Co-evaluation  AM-PAC PT "6 Clicks" Mobility   Outcome Measure  Help needed turning from your back to your side while in a flat bed without using bedrails?: None Help needed moving from lying on your back to sitting on the side of a flat bed without using bedrails?: None Help needed moving to and from a bed to a chair (including a wheelchair)?: A Little Help needed standing up from a chair using your arms (e.g., wheelchair or bedside chair)?: A Little Help needed to walk in hospital room?: A Little Help needed climbing 3-5 steps with a railing? : A Little 6 Click Score: 20    End of Session Equipment Utilized During Treatment: Gait belt Activity Tolerance: Patient tolerated treatment well Patient left: in bed;with call bell/phone within reach;with bed alarm set Nurse Communication: Mobility status PT Visit  Diagnosis: Other abnormalities of gait and mobility (R26.89);Muscle weakness (generalized) (M62.81)     Time: 0312-0325 PT Time Calculation (min) (ACUTE ONLY): 13 min  Charges:  $Gait Training: 8-22 mins                  Danielle Dess, PTA 05/30/22, 3:30 PM

## 2022-05-30 NOTE — Progress Notes (Signed)
RN was attempting to get pt back in the recliner chair after walking a lap around the unit, and pt noticed his comb on the floor which he tried to pick up. RN let patient know not to worry about it and RN would pick it up. Patient disregarded RN's request, and patient bent over to try to pick it up. Patient was unable to stand back up when he bent over, RN attempted to assist patient back up, but was unable to do so, and RN assisted pt to the ground. RN assisted the patient gradually to the ground and pt ended up in a seated position. Pt assisted back to the bed. Pt did not sustain any injury and reported no pain. Vital signs obtained. Charge RN Garment/textile technologist) and NP K. Foust notified.

## 2022-05-30 NOTE — Progress Notes (Signed)
Chap visited Pt on routine rounding. Pt narrated that his family, (Sister and Niece) had just visited with him and he was very grateful. Chap supplied compassionate presence and reflective listening.   05/30/22 1400  Spiritual Encounters  Type of Visit Initial  Care provided to: Patient  Referral source Chaplain assessment  Reason for visit Routine spiritual support  OnCall Visit No  Spiritual Framework  Presenting Themes Goals in life/care;Courage hope and growth  Community/Connection Family  Interventions  Spiritual Care Interventions Made Established relationship of care and support;Compassionate presence  Intervention Outcomes  Outcomes Connection to spiritual care;Awareness of support;Reduced isolation  Spiritual Care Plan  Spiritual Care Issues Still Outstanding No further spiritual care needs at this time (see row info)

## 2022-05-31 DIAGNOSIS — I5023 Acute on chronic systolic (congestive) heart failure: Secondary | ICD-10-CM | POA: Diagnosis not present

## 2022-05-31 DIAGNOSIS — I420 Dilated cardiomyopathy: Secondary | ICD-10-CM | POA: Diagnosis not present

## 2022-05-31 DIAGNOSIS — R413 Other amnesia: Secondary | ICD-10-CM | POA: Diagnosis not present

## 2022-05-31 MED ORDER — FUROSEMIDE 40 MG PO TABS: 40.0000 mg | ORAL_TABLET | Freq: Every day | ORAL | 0 refills | Status: DC

## 2022-05-31 MED ORDER — METOPROLOL SUCCINATE ER 25 MG PO TB24: 12.5000 mg | ORAL_TABLET | Freq: Every day | ORAL | 0 refills | Status: DC

## 2022-05-31 MED ORDER — AMIODARONE HCL 200 MG PO TABS: 200.0000 mg | ORAL_TABLET | Freq: Every day | ORAL | 0 refills | Status: DC

## 2022-05-31 NOTE — Progress Notes (Signed)
Mobility Specialist - Progress Note   05/31/22 1500  Mobility  Activity Ambulated with assistance in hallway  Level of Assistance Standby assist, set-up cues, supervision of patient - no hands on  Assistive Device Front wheel walker  Distance Ambulated (ft) 480 ft  Activity Response Tolerated well  Mobility Referral Yes  $Mobility charge 1 Mobility     Pt lying in bed upon arrival, utilizing RA. Pt completed bed mobility independently. Donned shoes without assist. Ambulated to bathroom for urinary out and ADLs at sink---vc for safety precautions during transfers. Pt ambulated in hallway with supervision. No LOB. VC to keep RW close to body. Pt returned to bed with alarm set, needs in reach.     Filiberto Pinks Mobility Specialist 05/31/22, 3:28 PM

## 2022-05-31 NOTE — Discharge Summary (Signed)
Physician Discharge Summary  DEVEREAUX STALLS VWU:[id number] DOB: [birth year] DOA: 05/12/2022  PCP: Barbette Reichmann, MD  Admit date: 05/12/2022 Discharge date: 05/31/2022  Admitted From: home  Disposition:  home w/ home health & family & friends care  Recommendations for Outpatient Follow-up:  Follow up with PCP in 1-2 weeks F/u w/ cardio, Dr. Darrold Junker, in 1-2 weeks   Home Health: yes Equipment/Devices:  Discharge Condition: stable  CODE STATUS: DNR Diet recommendation: Heart Healthy  Brief/Interim Summary: HPI was taken from [place]: This is an 89 yo male with a PMH of Paroxysmal Atrial Fibrillation, Ventricular Tachycardia s/p BiV ICD (08/09/2007), HLD, HTN, Dilated Cardiomyopathy, Chronic Systolic CHF, and Chronic Kidney Disease.  He presented to [facility] via EMS on 05/2 with c/o shortness of breath.  Pt reports symptoms started 02:00 am this morning.  He did not call EMS at that time because he did not want to wake them up.  However, pts sister stated the pt appeared weak yesterday therefore she called to check on the pt today and decided to call EMS.  Pts sister reported the pt has possible UTI and upon reviewing pts chart a urine culture was sent.  Pt was not started on abx awaiting culture results.     ED Course Upon arrival to the ER pt noted to be in a wide-complex tachycardia heart rate in the 140's with soft bp readings sbp 80-90's.  Pt has an ICD and per [employer] pts device only picks ups if the heart rate is greater than 150's.  ER provider contacted on call Cardiologist Dr. Juliann Pares who recommended 12 mg of adenosine, however pt remained tachycardic.  Therefore, per Dr. Glennis Brink recommendations pt received amiodarone bolus followed by amiodarone gtt.  Pt also noted to have an elevated bnp of 3,155.4 and CXR concerning for vascular congestion and tiny right effusion.  Per cardiology recommendations pt received 80 mg iv lasix x 1 dose per cardiology recommendations.  PCCM team contacted  for ICU admission given possible need of vasopressors following administration of iv lasix.   As per Dr. Mayford Knife 5/3-05/17/22: Pt was found to have CHF exacerbation on admission. Pt was treated w/ IV lasix, metoprolol. Of note, pt has hx of VT and is already on metoprolol & amio as per cardio. Cardio has been following and managing pt's CHF, PAF, dilated cardiomyopathy & ventricular tachycardia. Of note, pt has had poor short term memory as pt does not have a formal dx of dementia. PT/OT evaluated pt and recommends home health. Pt lives alone and pt is unsafe d/c home alone, this has been discussed w/ pt's POA, [name] (pt's daughter) & CM. Pt has no long term care insurance as per CM.    As per Dr. Myriam Forehand: Spencer Coleman is a 89 y.o. male PMH of Paroxysmal Atrial Fibrillation, Ventricular Tachycardia s/p BiV ICD (08/09/2007), HLD, HTN, Dilated Cardiomyopathy, Chronic Systolic CHF,  Chronic Kidney Disease, who presented to the hospital with shortness of breath.      As per Dr. Mayford Knife 5/15-5/21/24: Medically stable. No changes. Discussed pt's care w/ pt's daughter, [name]. [name] maybe able to set up a temporary safe d/c plan for the pt but needs another day. Have called the daughter, [name], twice (5/21 & 5/22) with no answer.  I was finally able to reach [name] who says she will have a community of family and friends to come pt's house to check on him daily and bring his meds/food/other things he needs. Pt's daughter is unable to stay  w/ pt 24-7.    Discharge Diagnoses:  Principal Problem:   Acute hypoxic respiratory failure (HCC) Active Problems:   Acute on chronic systolic CHF (congestive heart failure) (HCC)   CAD (coronary artery disease)   PAF (paroxysmal atrial fibrillation) (HCC)   Automatic implantable cardioverter-defibrillator in situ   CKD (chronic kidney disease) stage 3, GFR 30-59 ml/min (HCC)   Hypotension  Acute on chronic systolic CHF: continue on lasix. Monitor I/Os. Echo showed EF  estimated at 20 to 25%, grade 2 diastolic dysfunction, moderate LVH.  BNP was 3,155.   Dilated cardiomyopathy: w/ hx of ventricular tachycardia, s/p AICD placement. Continue on amio, metoprolol. Cardio signed off 05/20/22   Acute hypoxic respiratory failure: resolved    PAF: continue on amio, metoprolol, & eliquis     Hypotension: resolved    Acute UTI: completed abx course. Abxs were started based off of outpatient urine cx results    CKDIIIa: Cr is labile. Avoid nephrotoxic meds    Poor short term memory: no formal dx of dementia. Code stroke called on 05/16/2022 but CT head did not show any acute abnormality.  Altered mental status was attributed to Ativan at that time & has since been d/c. Continue on low dose seroquel    Thrombocytopenia: resolved    Hx of falls at home: continue ambulation w/ mobility specialist   Discharge Instructions  Discharge Instructions     Diet - low sodium heart healthy   Complete by: As directed    Discharge instructions   Complete by: As directed    F/u w/ PCP in 1-2 weeks. F/u w/ cardio, Dr. Darrold Junker, in 1-2 weeks   Increase activity slowly   Complete by: As directed    No wound care   Complete by: As directed       Allergies as of 05/31/2022   No Known Allergies      Medication List     TAKE these medications    acetaminophen 500 MG tablet Commonly known as: TYLENOL Take 2 tablets (1,000 mg total) by mouth every 6 (six) hours.   amiodarone 200 MG tablet Commonly known as: PACERONE Take 1 tablet (200 mg total) by mouth daily. Start taking on: Jun 01, 2022 What changed:  medication strength how much to take   apixaban 2.5 MG Tabs tablet Commonly known as: ELIQUIS Take 1 tablet (2.5 mg total) by mouth 2 (two) times daily.   furosemide 40 MG tablet Commonly known as: LASIX Take 1 tablet (40 mg total) by mouth daily. Start taking on: Jun 01, 2022 What changed:  medication strength how much to take    HYDROcodone-acetaminophen 5-325 MG tablet Commonly known as: NORCO/VICODIN Take 1 tablet by mouth every 6 (six) hours as needed for moderate pain.   metoprolol succinate 25 MG 24 hr tablet Commonly known as: TOPROL-XL Take 0.5 tablets (12.5 mg total) by mouth daily. Start taking on: Jun 01, 2022 What changed: how much to take   simvastatin 40 MG tablet Commonly known as: ZOCOR Take 40 mg by mouth at bedtime.   Vitamin D (Ergocalciferol) 1.25 MG (50000 UNIT) Caps capsule Commonly known as: DRISDOL Take 50,000 Units by mouth once a week.               Durable Medical Equipment  (From admission, onward)           Start     Ordered   05/24/22 1421  For home use only DME 3 n 1  Once  05/24/22 1420            Follow-up Information     Paraschos, Lyn Hollingshead, MD. Go in 1 week(s).   Specialty: Cardiology Contact information: [address] [facility] [place] [address] [phone]                No Known Allergies  Consultations: Cardio    Procedures/Studies: CT HEAD CODE STROKE WO CONTRAST`  Result Date: 05/16/2022 CLINICAL DATA:  Code stroke.  89 year old male EXAM: CT HEAD WITHOUT CONTRAST TECHNIQUE: Contiguous axial images were obtained from the base of the skull through the vertex without intravenous contrast. RADIATION DOSE REDUCTION: This exam was performed according to the departmental dose-optimization program which includes automated exposure control, adjustment of the mA and/or kV according to patient size and/or use of iterative reconstruction technique. COMPARISON:  Head CT 06/12/2021. FINDINGS: Brain: Stable cerebral volume from last year. No midline shift, mass effect, or evidence of intracranial mass lesion. No acute intracranial hemorrhage identified. No ventriculomegaly. No cortically based acute infarct identified. Gray-white differentiation does not appear significantly changed from last year, mild for  age small vessel disease changes. Vascular: Extensive Calcified atherosclerosis at the skull base. No suspicious intracranial vascular hyperdensity. Skull: No acute osseous abnormality identified. Sinuses/Orbits: Visualized paranasal sinuses and mastoids are stable and well aerated. Other: Visualized orbits and scalp soft tissues are within normal limits. ASPECTS [facility] Stroke Program Early CT Score) Total score (0-10 with 10 being normal): 10 IMPRESSION: 1. No acute cortically based infarct or acute intracranial hemorrhage identified. ASPECTS 10. 2. Mild for age small vessel disease changes. Electronically Signed   By: Odessa Fleming M.D.   On: 05/16/2022 10:24   ECHOCARDIOGRAM COMPLETE  Result Date: 05/13/2022    ECHOCARDIOGRAM REPORT   Patient Name:   Spencer Coleman Date of Exam: 05/13/2022 Medical Rec #:  [id number]    Height:       70.0 in Accession #:    [id number]   Weight:       153.7 lb Date of Birth:  31-May-1930     BSA:          1.866 m Patient Age:    89 years     BP:           89/71 mmHg Patient Gender: M            HR:           75 bpm. Exam Location:  [facility] Procedure: 2D Echo, Cardiac Doppler, Color Doppler and Strain Analysis Indications:     Abnormal ECG  History:         Patient has prior history of Echocardiogram examinations, most                  recent 06/13/2021. CHF, CAD, Abnormal ECG and Defibrillator,                  Arrythmias:Atrial Fibrillation and Tachycardia; Risk                  Factors:Dyslipidemia and Hypertension.  Sonographer:     Mikki Harbor Referring Phys:  [id number] DANA G NELSON Diagnosing Phys: Adrian Blackwater IMPRESSIONS  1. Left ventricular ejection fraction, by estimation, is 20 to 25%. The left ventricle has severely decreased function. The left ventricle demonstrates global hypokinesis. The left ventricular internal cavity size was severely dilated. There is moderate  left ventricular hypertrophy. Left ventricular diastolic parameters are consistent with Grade II diastolic  dysfunction (pseudonormalization).  2. Right ventricular systolic function is moderately reduced. The right ventricular size is moderately enlarged. Mildly increased right ventricular wall thickness. There is normal pulmonary artery systolic pressure.  3. Left atrial size was severely dilated.  4. Right atrial size was severely dilated.  5. The mitral valve is myxomatous. Severe mitral valve regurgitation.  6. Tricuspid valve regurgitation is mild to moderate.  7. The aortic valve is calcified. Aortic valve regurgitation is moderate.  8. Pulmonic valve regurgitation is moderate.  9. Aortic dilatation noted. Conclusion(s)/Recommendation(s): Findings consistent with dilated cardiomyopathy. FINDINGS  Left Ventricle: Left ventricular ejection fraction, by estimation, is 20 to 25%. The left ventricle has severely decreased function. The left ventricle demonstrates global hypokinesis. The left ventricular internal cavity size was severely dilated. There is moderate left ventricular hypertrophy. Left ventricular diastolic parameters are consistent with Grade II diastolic dysfunction (pseudonormalization). Right Ventricle: The right ventricular size is moderately enlarged. Mildly increased right ventricular wall thickness. Right ventricular systolic function is moderately reduced. There is normal pulmonary artery systolic pressure. The tricuspid regurgitant velocity is 2.61 m/s, and with an assumed right atrial pressure of 8 mmHg, the estimated right ventricular systolic pressure is 35.2 mmHg. Left Atrium: Left atrial size was severely dilated. Right Atrium: Right atrial size was severely dilated. Pericardium: There is no evidence of pericardial effusion. Mitral Valve: The mitral valve is myxomatous. Severe mitral valve regurgitation. MV peak gradient, 2.5 mmHg. The mean mitral valve gradient is 1.0 mmHg. Tricuspid Valve: The tricuspid valve is grossly normal. Tricuspid valve regurgitation is mild to moderate. Aortic Valve:  The aortic valve is calcified. Aortic valve regurgitation is moderate. Aortic regurgitation PHT measures 414 msec. Aortic valve mean gradient measures 6.5 mmHg. Aortic valve peak gradient measures 12.7 mmHg. Aortic valve area, by VTI measures 1.91 cm. Pulmonic Valve: The pulmonic valve was grossly normal. Pulmonic valve regurgitation is moderate. Aorta: Aortic dilatation noted. IAS/Shunts: No atrial level shunt detected by color flow Doppler.  LEFT VENTRICLE PLAX 2D LVIDd:         7.00 cm      Diastology LVIDs:         6.50 cm      LV e' medial:    3.16 cm/s LV PW:         0.80 cm      LV E/e' medial:  24.9 LV IVS:        0.90 cm      LV e' lateral:   4.70 cm/s LVOT diam:     2.00 cm      LV E/e' lateral: 16.7 LV SV:         68 LV SV Index:   37 LVOT Area:     3.14 cm  LV Volumes (MOD) LV vol d, MOD A2C: 222.0 ml LV vol d, MOD A4C: 177.0 ml LV vol s, MOD A2C: 165.0 ml LV vol s, MOD A4C: 137.0 ml LV SV MOD A2C:     57.0 ml LV SV MOD A4C:     177.0 ml LV SV MOD BP:      46.7 ml RIGHT VENTRICLE RV Basal diam:  3.65 cm RV Mid diam:    3.30 cm RV S prime:     10.20 cm/s TAPSE (M-mode): 1.3 cm LEFT ATRIUM              Index        RIGHT ATRIUM           Index LA diam:  5.40 cm  2.89 cm/m   RA Area:     15.60 cm LA Vol (A2C):   93.3 ml  50.00 ml/m  RA Volume:   42.60 ml  22.83 ml/m LA Vol (A4C):   100.0 ml 53.59 ml/m LA Biplane Vol: 103.0 ml 55.19 ml/m  AORTIC VALVE                     PULMONIC VALVE AV Area (Vmax):    2.07 cm      PV Vmax:       0.70 m/s AV Area (Vmean):   1.92 cm      PV Peak grad:  1.9 mmHg AV Area (VTI):     1.91 cm AV Vmax:           178.00 cm/s AV Vmean:          121.500 cm/s AV VTI:            0.358 m AV Peak Grad:      12.7 mmHg AV Mean Grad:      6.5 mmHg LVOT Vmax:         117.50 cm/s LVOT Vmean:        74.300 cm/s LVOT VTI:          0.218 m LVOT/AV VTI ratio: 0.61 AI PHT:            414 msec  AORTA Ao Root diam: 3.40 cm Ao Asc diam:  3.40 cm MITRAL VALVE                   TRICUSPID VALVE MV Area (PHT): 5.27 cm       TR Peak grad:   27.2 mmHg MV Area VTI:   3.05 cm       TR Vmax:        261.00 cm/s MV Peak grad:  2.5 mmHg MV Mean grad:  1.0 mmHg       SHUNTS MV Vmax:       0.79 m/s       Systemic VTI:  0.22 m MV Vmean:      48.4 cm/s      Systemic Diam: 2.00 cm MV Decel Time: 144 msec MR Peak grad:    86.9 mmHg MR Mean grad:    55.0 mmHg MR Vmax:         466.00 cm/s MR Vmean:        349.0 cm/s MR PISA:         4.02 cm MR PISA Eff ROA: 80 mm MR PISA Radius:  0.80 cm MV E velocity: 78.60 cm/s MV A velocity: 56.10 cm/s MV E/A ratio:  1.40 Shaukat Khan Electronically signed by Adrian Blackwater Signature Date/Time: 05/13/2022/12:18:19 PM    Final    US Venous Img Lower Unilateral Right  Result Date: 05/12/2022 CLINICAL DATA:  Leg swelling EXAM: RIGHT LOWER EXTREMITY VENOUS DOPPLER ULTRASOUND TECHNIQUE: Gray-scale sonography with compression, as well as color and duplex ultrasound, were performed to evaluate the deep venous system(s) from the level of the common femoral vein through the popliteal and proximal calf veins. COMPARISON:  None Available. FINDINGS: VENOUS Normal compressibility of the common femoral, superficial femoral, and popliteal veins, as well as the visualized calf veins. Visualized portions of profunda femoral vein and great saphenous vein unremarkable. No filling defects to suggest DVT on grayscale or color Doppler imaging. Doppler waveforms show normal direction of venous flow, normal respiratory plasticity and response to augmentation. Limited views  of the contralateral common femoral vein are unremarkable. OTHER Soft tissue edema of the lower right leg. Limitations: none IMPRESSION: No evidence of right lower extremity DVT. Electronically Signed   By: Allegra Lai M.D.   On: 05/12/2022 16:58   DG Chest Portable 1 View  Result Date: 05/12/2022 CLINICAL DATA:  Shortness of breath EXAM: PORTABLE CHEST 1 VIEW COMPARISON:  X-ray 01/10/2022 FINDINGS: Left upper  chest defibrillator. Heart enlarged. Calcified and tortuous aorta. Mild vascular congestion which is new from previous. Elevated right hemidiaphragm with a tiny right effusion and adjacent atelectasis. No pneumothorax. Overlapping cardiac leads. Osteopenia. IMPRESSION: Developing vascular congestion.  Tiny right effusion. Enlarged heart with calcified aorta and defibrillator Electronically Signed   By: Karen Kays M.D.   On: 05/12/2022 13:55   (Echo, Carotid, EGD, Colonoscopy, ERCP)    Subjective: Pt denies any complaints   Discharge Exam: Vitals:   05/31/22 1201 05/31/22 1555  BP: 99/60 108/63  Pulse: 77 69  Resp: 14 14  Temp:  97.8 F (36.6 C)  SpO2: 98% 100%   Vitals:   05/31/22 0833 05/31/22 1156 05/31/22 1201 05/31/22 1555  BP: 116/80 (!) 85/49 99/60 108/63  Pulse: 77 73 77 69  Resp: 14 14 14 14   Temp: 97.8 F (36.6 C) 98.3 F (36.8 C)  97.8 F (36.6 C)  TempSrc:      SpO2: 96% 99% 98% 100%  Weight:      Height:        General: Pt is alert, awake, not in acute distress Cardiovascular: S1/S2 +, no rubs, no gallops Respiratory: CTA bilaterally, no wheezing, no rhonchi Abdominal: Soft, NT, ND, bowel sounds + Extremities: no edema, no cyanosis    The results of significant diagnostics from this hospitalization (including imaging, microbiology, ancillary and laboratory) are listed below for reference.     Microbiology: No results found for this or any previous visit (from the past 240 hour(s)).   Labs: BNP (last 3 results) Recent Labs    06/12/21 0433 06/14/21 0623 05/12/22 1338  BNP >4,500.0* 1,331.9* 3,155.4*   Basic Metabolic Panel: Recent Labs  Lab 05/30/22 0514  NA 137  K 4.2  CL 100  CO2 29  GLUCOSE 96  BUN 45*  CREATININE 1.90*  CALCIUM 8.9   Liver Function Tests: No results for input(s): "AST", "ALT", "ALKPHOS", "BILITOT", "PROT", "ALBUMIN" in the last 168 hours. No results for input(s): "LIPASE", "AMYLASE" in the last 168 hours. No  results for input(s): "AMMONIA" in the last 168 hours. CBC: Recent Labs  Lab 05/30/22 0514  WBC 6.3  HGB 12.9*  HCT 40.6  MCV 93.5  PLT 155   Cardiac Enzymes: No results for input(s): "CKTOTAL", "CKMB", "CKMBINDEX", "TROPONINI" in the last 168 hours. BNP: Invalid input(s): "POCBNP" CBG: No results for input(s): "GLUCAP" in the last 168 hours. D-Dimer No results for input(s): "DDIMER" in the last 72 hours. Hgb A1c No results for input(s): "HGBA1C" in the last 72 hours. Lipid Profile No results for input(s): "CHOL", "HDL", "LDLCALC", "TRIG", "CHOLHDL", "LDLDIRECT" in the last 72 hours. Thyroid function studies No results for input(s): "TSH", "T4TOTAL", "T3FREE", "THYROIDAB" in the last 72 hours.  Invalid input(s): "FREET3" Anemia work up No results for input(s): "VITAMINB12", "FOLATE", "FERRITIN", "TIBC", "IRON", "RETICCTPCT" in the last 72 hours. Urinalysis    Component Value Date/Time   COLORURINE YELLOW (A) 05/12/2022 2138   APPEARANCEUR HAZY (A) 05/12/2022 2138   APPEARANCEUR Clear 02/01/2018 1111   LABSPEC 1.010 05/12/2022 2138   LABSPEC  1.009 04/09/2013 1315   PHURINE 5.0 05/12/2022 2138   GLUCOSEU NEGATIVE 05/12/2022 2138   GLUCOSEU Negative 04/09/2013 1315   HGBUR LARGE (A) 05/12/2022 2138   BILIRUBINUR NEGATIVE 05/12/2022 2138   BILIRUBINUR Negative 02/01/2018 1111   BILIRUBINUR Negative 04/09/2013 1315   KETONESUR NEGATIVE 05/12/2022 2138   PROTEINUR 30 (A) 05/12/2022 2138   NITRITE NEGATIVE 05/12/2022 2138   LEUKOCYTESUR NEGATIVE 05/12/2022 2138   LEUKOCYTESUR Negative 04/09/2013 1315   Sepsis Labs Recent Labs  Lab 05/30/22 0514  WBC 6.3   Microbiology No results found for this or any previous visit (from the past 240 hour(s)).   Time coordinating discharge: Over 30 minutes  SIGNED:   Charise Killian, MD  [facility] 05/31/2022, 4:26 PM Pager   If 7PM-7AM, please contact night-coverage [email or web address]

## 2022-05-31 NOTE — Progress Notes (Signed)
PROGRESS NOTE   HPI was taken from NP [facility]: This is an 89 yo male with a PMH of Paroxysmal Atrial Fibrillation, Ventricular Tachycardia s/p BiV ICD (08/09/2007), HLD, HTN, Dilated Cardiomyopathy, Chronic Systolic CHF, and Chronic Kidney Disease.  He presented to [facility] via EMS on 05/2 with c/o shortness of breath.  Pt reports symptoms started 02:00 am this morning.  He did not call EMS at that time because he did not want to wake them up.  However, pts sister stated the pt appeared weak yesterday therefore she called to check on the pt today and decided to call EMS.  Pts sister reported the pt has possible UTI and upon reviewing pts chart a urine culture was sent.  Pt was not started on abx awaiting culture results.     ED Course Upon arrival to the ER pt noted to be in a wide-complex tachycardia heart rate in the 140's with soft bp readings sbp 80-90's.  Pt has an ICD and per [employer] pts device only picks ups if the heart rate is greater than 150's.  ER provider contacted on call Cardiologist Dr. Juliann Pares who recommended 12 mg of adenosine, however pt remained tachycardic.  Therefore, per Dr. Glennis Brink recommendations pt received amiodarone bolus followed by amiodarone gtt.  Pt also noted to have an elevated bnp of 3,155.4 and CXR concerning for vascular congestion and tiny right effusion.  Per cardiology recommendations pt received 80 mg iv lasix x 1 dose per cardiology recommendations.  PCCM team contacted for ICU admission given possible need of vasopressors following administration of iv lasix.   As per Dr. Mayford Knife 5/3-05/17/22: Pt was found to have CHF exacerbation on admission. Pt was treated w/ IV lasix, metoprolol. Of note, pt has hx of VT and is already on metoprolol & amio as per cardio. Cardio has been following and managing pt's CHF, PAF, dilated cardiomyopathy & ventricular tachycardia. Of note, pt has had poor short term memory as pt does not have a formal dx of dementia. PT/OT evaluated  pt and recommends home health. Pt lives alone and pt is unsafe d/c home alone, this has been discussed w/ pt's POA, [name] (pt's daughter) & CM. Pt has no long term care insurance as per CM.   As per Dr. Myriam Forehand: Spencer Coleman is a 89 y.o. male PMH of Paroxysmal Atrial Fibrillation, Ventricular Tachycardia s/p BiV ICD (08/09/2007), HLD, HTN, Dilated Cardiomyopathy, Chronic Systolic CHF,  Chronic Kidney Disease, who presented to the hospital with shortness of breath.    As per Dr. Mayford Knife 5/15-5/21/24: Medically stable. No changes. Discussed pt's care w/ pt's daughter, [name]. [name] maybe able to set up a temporary safe d/c plan for the pt but needs another day. Have called the daughter, [name], twice (5/21 & 5/22) with no answer. No safe d/c plan yet.    JYE MOOS  ZOX:[id number] DOB: 1930-09-26 DOA: 05/12/2022 PCP: Barbette Reichmann, MD    Assessment & Plan:   Principal Problem:   Acute hypoxic respiratory failure (HCC) Active Problems:   Acute on chronic systolic CHF (congestive heart failure) (HCC)   CAD (coronary artery disease)   PAF (paroxysmal atrial fibrillation) (HCC)   Automatic implantable cardioverter-defibrillator in situ   CKD (chronic kidney disease) stage 3, GFR 30-59 ml/min (HCC)   Hypotension  Assessment and Plan: Acute on chronic systolic CHF: continue on lasix. Monitor I/Os. Echo showed EF estimated at 20 to 25%, grade 2 diastolic dysfunction, moderate LVH.  BNP was 3,155.  Dilated cardiomyopathy:  w/ hx of ventricular tachycardia, s/p AICD placement. Continue on amio, metoprolol. Cardio signed off 05/20/22   Acute hypoxic respiratory failure: resolved    PAF: continue on amio, metoprolol, & eliquis     Hypotension: resolved    Acute UTI: completed abx course. Abxs were started based off of outpatient urine cx results    CKDIIIa: Cr is labile. Avoid nephrotoxic meds    Poor short term memory: no formal dx of dementia. Code stroke called on 05/16/2022 but CT head did  not show any acute abnormality.  Altered mental status was attributed to Ativan at that time & has since been d/c. Continue on low dose seroquel   Thrombocytopenia: resolved    Hx of falls at home: continue ambulation w/ mobility specialist       DVT prophylaxis: eliquis  Code Status: DNR Family Communication: called pt's daughter, [name], to f/u on our conversation on 05/29/22 but no answer so I left a voicemail  Disposition Plan: unsafe d/c plan.   Level of care: Progressive Status is: Inpatient Remains inpatient appropriate because:  Medically stable. Unsafe d/c plan   Consultants:  cardio  Procedures:   Antimicrobials:    Subjective: Pt denies any complaints   Objective: Vitals:   05/30/22 1700 05/30/22 2027 05/30/22 2330 05/31/22 0618  BP: 110/70 (!) 140/96 93/60 (!) 95/56  Pulse: 70 80 70 70  Resp: 12 20 17 19   Temp: 98 F (36.7 C) (!) 97.5 F (36.4 C) 97.7 F (36.5 C) 97.8 F (36.6 C)  TempSrc: Oral Oral Oral Oral  SpO2: 97% 100% 97% 99%  Weight:    62.9 kg  Height:        Intake/Output Summary (Last 24 hours) at 05/31/2022 0816 Last data filed at 05/31/2022 0500 Gross per 24 hour  Intake 580 ml  Output 400 ml  Net 180 ml    Filed Weights   05/29/22 0449 05/30/22 0403 05/31/22 0618  Weight: 62 kg 61.7 kg 62.9 kg    Examination:  General exam: Appears comfortable   Respiratory system: clear breath sounds b/l  Cardiovascular system: S1 & S2+. No rubs or clicks  Gastrointestinal system: abd is sfot, NT, ND & normal bowel sounds    Central nervous system: alert & oriented x 3 but not to situation  Psychiatry: judgement and insight appears at baseline     Data Reviewed: I have personally reviewed following labs and imaging studies  CBC: Recent Labs  Lab 05/30/22 0514  WBC 6.3  HGB 12.9*  HCT 40.6  MCV 93.5  PLT 155   Basic Metabolic Panel: Recent Labs  Lab 05/30/22 0514  NA 137  K 4.2  CL 100  CO2 29  GLUCOSE 96  BUN 45*   CREATININE 1.90*  CALCIUM 8.9   GFR: Estimated Creatinine Clearance: 23.4 mL/min (A) (by C-G formula based on SCr of 1.9 mg/dL (H)). Liver Function Tests: No results for input(s): "AST", "ALT", "ALKPHOS", "BILITOT", "PROT", "ALBUMIN" in the last 168 hours. No results for input(s): "LIPASE", "AMYLASE" in the last 168 hours. No results for input(s): "AMMONIA" in the last 168 hours. Coagulation Profile: No results for input(s): "INR", "PROTIME" in the last 168 hours. Cardiac Enzymes: No results for input(s): "CKTOTAL", "CKMB", "CKMBINDEX", "TROPONINI" in the last 168 hours. BNP (last 3 results) No results for input(s): "PROBNP" in the last 8760 hours. HbA1C: No results for input(s): "HGBA1C" in the last 72 hours. CBG: No results for input(s): "GLUCAP" in the last 168 hours. Lipid  Profile: No results for input(s): "CHOL", "HDL", "LDLCALC", "TRIG", "CHOLHDL", "LDLDIRECT" in the last 72 hours. Thyroid Function Tests: No results for input(s): "TSH", "T4TOTAL", "FREET4", "T3FREE", "THYROIDAB" in the last 72 hours. Anemia Panel: No results for input(s): "VITAMINB12", "FOLATE", "FERRITIN", "TIBC", "IRON", "RETICCTPCT" in the last 72 hours. Sepsis Labs: No results for input(s): "PROCALCITON", "LATICACIDVEN" in the last 168 hours.  No results found for this or any previous visit (from the past 240 hour(s)).       Radiology Studies: No results found.      Scheduled Meds:  amiodarone  200 mg Oral Daily   apixaban  2.5 mg Oral BID   furosemide  40 mg Oral Daily   metoprolol succinate  12.5 mg Oral Daily   QUEtiapine  12.5 mg Oral QHS   simvastatin  40 mg Oral q1800   Continuous Infusions:  sodium chloride       LOS: 19 days    Time spent: 15 mins     Charise Killian, MD [facility] Pager [phone]  If 7PM-7AM, please contact night-coverage [email or web address] 05/31/2022, 8:16 AM

## 2022-05-31 NOTE — Progress Notes (Signed)
Patient is not able to walk the distance required to go the bathroom, or he/she is unable to safely negotiate stairs required to access the bathroom.  A BSC will alleviate this problem. 

## 2022-05-31 NOTE — TOC Progression Note (Addendum)
Transition of Care [facility]) - Progression Note    Patient Details  Name: Spencer Coleman MRN: [id number] Date of Birth: 25-Apr-1930  [facility] CM/SW Contact  Truddie Hidden, RN Phone Number: 05/31/2022, 2:22 PM  Clinical Narrative:    Spoke with patient's daughter [name] to discuss follow up  and updates she may have on discharge plan. She stated she had spoken with Dr. Mayford Knife and was on her way to transport the patient home today. She was advised there are no current discharge orders.[name] stated she was planing to take the patient home and will continue to work on placement while patient is in his home setting. RNCM inquired about who will providing care for the  patient. [name] refused to discuss his plan. "I will not go into details, I'm on my way to pick him up."  She is agreeable to [facility] and does not have a choice of HH agencies. She would like a list to choose from. She has been advised all agencies will not accept patient's insurance. RNCM reviewed agencies that accept patient insurance including:  [facility], [place], [facility], and [facility]. [name] was advised HH visits are determined by the agency that accepts the referral and their assessment.  [name] has been educated that these visits will most likely not occur more than 3 times weekly on average of 45 mins. She was previously educated about the difference in personal care services and skilled [facility] services.  MD notified.  3:27pm Attempted [facility] Adult APS intake. Caller unable to hear this RNCM. Call reattempted intake not able to be assessed at this time. Message left.          Expected Discharge Plan and Services                                               Social Determinants of Health (SDOH) Interventions SDOH Screenings   Food Insecurity: No Food Insecurity (05/16/2022)  Housing: Low Risk  (05/16/2022)  Transportation Needs: No Transportation Needs (05/16/2022)  Utilities: Not At Risk (05/16/2022)   Tobacco Use: Low Risk  (05/18/2022)    Readmission Risk Interventions     No data to display

## 2022-06-10 ENCOUNTER — Telehealth: Payer: Self-pay | Admitting: Family

## 2022-06-10 ENCOUNTER — Encounter: Payer: Medicare HMO | Admitting: Family

## 2022-06-10 NOTE — Telephone Encounter (Signed)
Patient did not show for his Heart Failure Clinic appointment on 06/10/22.

## 2022-09-25 ENCOUNTER — Encounter: Payer: Self-pay | Admitting: Emergency Medicine

## 2022-09-25 ENCOUNTER — Ambulatory Visit
Admission: EM | Admit: 2022-09-25 | Discharge: 2022-09-25 | Disposition: A | Discharge status: Home / Self Care | Payer: Medicare HMO

## 2022-09-25 ENCOUNTER — Other Ambulatory Visit: Payer: Self-pay

## 2022-09-25 ENCOUNTER — Emergency Department
Admission: EM | Admit: 2022-09-25 | Discharge: 2022-09-25 | Disposition: A | Discharge status: Home / Self Care | Payer: Medicare HMO | Attending: Emergency Medicine | Admitting: Emergency Medicine

## 2022-09-25 ENCOUNTER — Emergency Department: Payer: Medicare HMO

## 2022-09-25 DIAGNOSIS — M7989 Other specified soft tissue disorders: Secondary | ICD-10-CM

## 2022-09-25 DIAGNOSIS — I509 Heart failure, unspecified: Secondary | ICD-10-CM | POA: Diagnosis not present

## 2022-09-25 DIAGNOSIS — E8770 Fluid overload, unspecified: Secondary | ICD-10-CM

## 2022-09-25 DIAGNOSIS — R600 Localized edema: Secondary | ICD-10-CM | POA: Insufficient documentation

## 2022-09-25 LAB — URINALYSIS, ROUTINE W REFLEX MICROSCOPIC
Bilirubin Urine: NEGATIVE
Glucose, UA: NEGATIVE mg/dL
Hgb urine dipstick: NEGATIVE
Ketones, ur: NEGATIVE mg/dL
Leukocytes,Ua: NEGATIVE
Nitrite: NEGATIVE
Protein, ur: NEGATIVE mg/dL
Specific Gravity, Urine: 1.02 (ref 1.005–1.030)
pH: 5 (ref 5.0–8.0)

## 2022-09-25 LAB — BASIC METABOLIC PANEL
Anion gap: 12 (ref 5–15)
BUN: 28 mg/dL — ABNORMAL HIGH (ref 8–23)
CO2: 26 mmol/L (ref 22–32)
Calcium: 9.1 mg/dL (ref 8.9–10.3)
Chloride: 101 mmol/L (ref 98–111)
Creatinine, Ser: 1.4 mg/dL — ABNORMAL HIGH (ref 0.61–1.24)
GFR, Estimated: 48 mL/min — ABNORMAL LOW (ref >60)
Glucose, Bld: 93 mg/dL (ref 70–99)
Potassium: 4.4 mmol/L (ref 3.5–5.1)
Sodium: 139 mmol/L (ref 135–145)

## 2022-09-25 LAB — CBC
HCT: 41.2 % (ref 39.0–52.0)
Hemoglobin: 12.9 g/dL — ABNORMAL LOW (ref 13.0–17.0)
MCH: 30.1 pg (ref 26.0–34.0)
MCHC: 31.3 g/dL (ref 30.0–36.0)
MCV: 96 fL (ref 80.0–100.0)
Platelets: 157 10*3/uL (ref 150–400)
RBC: 4.29 MIL/uL (ref 4.22–5.81)
RDW: 14.7 % (ref 11.5–15.5)
WBC: 6.2 10*3/uL (ref 4.0–10.5)
nRBC: 0 % (ref 0.0–0.2)

## 2022-09-25 LAB — TROPONIN I (HIGH SENSITIVITY)
Troponin I (High Sensitivity): 34 ng/L — ABNORMAL HIGH (ref <18)
Troponin I (High Sensitivity): 29 ng/L — ABNORMAL HIGH (ref <18)

## 2022-09-25 LAB — BRAIN NATRIURETIC PEPTIDE: B Natriuretic Peptide: >4500 pg/mL — ABNORMAL HIGH (ref 0.0–100.0)

## 2022-09-25 MED ORDER — FUROSEMIDE 10 MG/ML IJ SOLN
60.0000 mg | Freq: Once | INTRAMUSCULAR | Status: AC
  Administered 2022-09-25: 60 mg via INTRAVENOUS
  Filled 2022-09-25: qty 8

## 2022-09-25 NOTE — Discharge Instructions (Addendum)
Please present to ED on discharge from urgent care to rule out blood clot or worsening of congestive heart failure

## 2022-09-25 NOTE — ED Triage Notes (Signed)
Pt family sts that pt has been having bilateral feet swelling and than this AM pt is having right left swelling. Per daughter this has been going on for the last five days and that the blisters have popped.

## 2022-09-25 NOTE — ED Triage Notes (Signed)
Patient reports swelling in his right leg and foot for the past 10 days.  Patient denies fall or injury.  Patient denies any pain in his right leg.

## 2022-09-25 NOTE — ED Notes (Signed)
Patient is being discharged from the Urgent Care and sent to the Emergency Department via Personal Vehicle . Per Eunice Blase, NP, patient is in need of higher level of care due to Possible Congestive Heart Failure. Patient is aware and verbalizes understanding of plan of care.  Vitals:   09/25/22 1331  BP: 115/75  Pulse: 84  Resp: 16  Temp: 97.6 F (36.4 C)  SpO2: 94%

## 2022-09-25 NOTE — ED Provider Notes (Signed)
[facility] Provider Note    Event Date/Time   First MD Initiated Contact with Patient 09/25/22 1630     (approximate)   History   Leg Swelling   HPI  Spencer Coleman is a 89 y.o. male who presents to the emergency department today because of concerns for leg swelling.  Swelling has gotten worse over the past 4 to 5 days.  Primarily in the right leg.  The patient denies any pain.  Denies any trauma.  Does have history of heart failure.  States he does take a fluid pill he has not noticed any change in his urination. Went to urgent care where they were concerned for blood clot or fluid overload. Patient denies any shortness of breath or chest pain.     Physical Exam   Triage Vital Signs: ED Triage Vitals  Encounter Vitals Group     BP 09/25/22 1452 120/76     Systolic BP Percentile --      Diastolic BP Percentile --      Pulse Rate 09/25/22 1452 72     Resp 09/25/22 1452 18     Temp 09/25/22 1452 98 F (36.7 C)     Temp Source 09/25/22 1452 Oral     SpO2 09/25/22 1452 98 %     Weight 09/25/22 1450 138 lb 10.7 oz (62.9 kg)     Height 09/25/22 1450 5\' 10"  (1.778 m)     Head Circumference --      Peak Flow --      Pain Score 09/25/22 1450 0     Pain Loc --      Pain Education --      Exclude from Growth Chart --     Most recent vital signs: Vitals:   09/25/22 1452  BP: 120/76  Pulse: 72  Resp: 18  Temp: 98 F (36.7 C)  SpO2: 98%   General: Awake, alert, oriented. CV:  Good peripheral perfusion. Regular rate and rhythm. Resp:  Normal effort. Lungs clear. Abd:  No distention.  Other:  Bilateral lower extremity edema, more significant in the right leg. Some blistering. No erythema. No tenderness.   ED Results / Procedures / Treatments   Labs (all labs ordered are listed, but only abnormal results are displayed) Labs Reviewed  BASIC METABOLIC PANEL - Abnormal; Notable for the following components:      Result Value   BUN 28 (*)     Creatinine, Ser 1.40 (*)    GFR, Estimated 48 (*)    All other components within normal limits  CBC - Abnormal; Notable for the following components:   Hemoglobin 12.9 (*)    All other components within normal limits  BRAIN NATRIURETIC PEPTIDE - Abnormal; Notable for the following components:   B Natriuretic Peptide >4,500.0 (*)    All other components within normal limits  URINALYSIS, ROUTINE W REFLEX MICROSCOPIC - Abnormal; Notable for the following components:   Color, Urine YELLOW (*)    APPearance CLEAR (*)    All other components within normal limits  TROPONIN I (HIGH SENSITIVITY) - Abnormal; Notable for the following components:   Troponin I (High Sensitivity) 34 (*)    All other components within normal limits  TROPONIN I (HIGH SENSITIVITY)     EKG  I, Phineas Semen, attending physician, personally viewed and interpreted this EKG  EKG Time: 1450 Rate: 74 Rhythm: AV dual paced rhythm with PVC Axis: left axis deviation Intervals: qtc 559 QRS:  wide ST changes: no st elevation Impression: abnormal ekg   RADIOLOGY Right lower extremity US IMPRESSION:  1. No findings of right lower extremity DVT.  2. Suspected complex fluid collection along the anteromedial knee in  the subcutaneous tissues, possibly a hematoma, measuring 15.5 by 4.3  cm.    PROCEDURES:  Critical Care performed: No  MEDICATIONS ORDERED IN ED: Medications - No data to display   IMPRESSION / MDM / ASSESSMENT AND PLAN / ED COURSE  I reviewed the triage vital signs and the nursing notes.                              Differential diagnosis includes, but is not limited to, DVT, fluid overload  Patient's presentation is most consistent with acute presentation with potential threat to life or bodily function.   The patient is on the cardiac monitor to evaluate for evidence of arrhythmia and/or significant heart rate changes.  Patient presents to the emergency department today because of  concerns for leg swelling worse on the right side.  On exam patient does have bilateral peripheral edema.  It is more pronounced on the right side.  There is no or erythema or tenderness.  Blood work does show elevated BNP.  Patient was given Lasix.  I did obtain an ultrasound which did not show any blood clots but showed possible hematoma.  I discussed this finding with the patient.  Did offer admission however patient without any shortness of breath.  He stated he would rather be discharged home.  I think it is reasonable for patient to be discharged home.  Will give patient heart failure clinic follow-up information.  Did encourage patient to return for any shortness of breath.      FINAL CLINICAL IMPRESSION(S) / ED DIAGNOSES   Final diagnoses:  Peripheral edema  Hypervolemia, unspecified hypervolemia type       Note:  This document was prepared using Dragon voice recognition software and may include unintentional dictation errors.    Phineas Semen, MD 09/25/22 Mikle Bosworth

## 2022-09-25 NOTE — Discharge Instructions (Signed)
Please seek medical attention for any high fevers, chest pain, shortness of breath, change in behavior, persistent vomiting, bloody stool or any other new or concerning symptoms.  

## 2022-09-25 NOTE — ED Provider Notes (Signed)
[facility]    CSN: [id number] Arrival date & time: 09/25/22  1302      History   Chief Complaint Chief Complaint  Patient presents with   Leg Swelling    right    HPI Spencer Coleman is a 89 y.o. male.   Swelling of right leg x 1 week progressively getting larger.  The history is provided by the patient and a relative.    Past Medical History:  Diagnosis Date   CAD (coronary artery disease)    Nonobstructive cardiac disease by cath   CHF (congestive heart failure) (HCC)    nonischemic cardiomyopathy   CKD (chronic kidney disease)    History of kidney stones    Hyperlipidemia    Hypertension    Inguinal hernia    PAF (paroxysmal atrial fibrillation) (HCC)    Patella fracture 01/09/2015   Renal colic 10/07/2011   Ventricular tachycardia (HCC)    s/p ICD    Patient Active Problem List   Diagnosis Date Noted   Hypotension 05/21/2022   Acute hypoxic respiratory failure (HCC) 05/12/2022   Physical deconditioning 06/13/2021   Acute on chronic systolic CHF (congestive heart failure) (HCC) 06/12/2021   Acute renal failure superimposed on stage 3a chronic kidney disease (HCC) 06/12/2021   Gallstone 06/12/2021   Elevated troponin 06/12/2021   Thrombocytopenia (HCC) 06/12/2021   CAD (coronary artery disease) 06/12/2021   Delirium 06/12/2021   PAF (paroxysmal atrial fibrillation) (HCC) 06/12/2021   Hx subclinical atrial fibrillation on ICD interrogation, concern for Afib/RVR  01/06/2021   CKD (chronic kidney disease) stage 3, GFR 30-59 ml/min (HCC) 01/06/2021   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   Closed displaced fracture of coronoid process of left ulna 06/28/2016   Malnutrition of moderate degree 02/08/2015   Calculus of kidney 01/09/2015   History of cardiac catheterization 06/06/2013   Essential hypertension 06/06/2013   HLD (hyperlipidemia) 06/06/2013   ACE inhibitor intolerance 06/06/2013   Ventricular tachycardia  04/05/2013   SVT (supraventricular tachycardia) 04/05/2013   Automatic implantable cardioverter-defibrillator in situ 03/25/2013   Calculus in bladder 08/07/2012   Gross hematuria 08/07/2012   Incomplete emptying of bladder 08/07/2012   Acute cystitis 10/07/2011   Benign localized prostatic hyperplasia with lower urinary tract symptoms (LUTS) 10/07/2011    Past Surgical History:  Procedure Laterality Date   CARDIAC CATHETERIZATION     CARDIAC DEFIBRILLATOR PLACEMENT  2009   CYSTOSCOPY WITH LITHOLAPAXY N/A 02/21/2018   Procedure: CYSTOSCOPY WITH LITHOLAPAXY;  Surgeon: Sondra Come, MD;  Location: [facility];  Service: Urology;  Laterality: N/A;   ELBOW SURGERY Left    HOLMIUM LASER APPLICATION N/A 02/21/2018   Procedure: HOLMIUM LASER APPLICATION;  Surgeon: Sondra Come, MD;  Location: [facility];  Service: Urology;  Laterality: N/A;   ICD GENERATOR CHANGEOUT N/A 01/28/2021   Procedure: ICD GENERATOR CHANGEOUT;  Surgeon: Marcina Millard, MD;  Location: [facility];  Service: Cardiovascular;  Laterality: N/A;   INGUINAL HERNIA REPAIR     LITHOTRIPSY     ORIF left wrist fracture Left    ORIF PATELLA Left 01/10/2015   Procedure: OPEN REDUCTION INTERNAL (ORIF) FIXATION PATELLA;  Surgeon: Donato Heinz, MD;  Location: [facility];  Service: Orthopedics;  Laterality: Left;   ORIF PATELLA Left 02/06/2015   Procedure: OPEN REDUCTION INTERNAL (ORIF) FIXATION PATELLA;  Surgeon: Donato Heinz, MD;  Location: [facility];  Service: Orthopedics;  Laterality: Left;   TRANSURETHRAL RESECTION OF PROSTATE N/A 02/21/2018  Procedure: TRANSURETHRAL RESECTION OF THE PROSTATE (TURP);  Surgeon: Sondra Come, MD;  Location: [facility];  Service: Urology;  Laterality: N/A;       Home Medications    Prior to Admission medications   Medication Sig Start Date End Date Taking? Authorizing Provider  acetaminophen (TYLENOL) 500 MG tablet Take 2 tablets (1,000 mg total) by mouth every 6 (six)  hours. 02/22/18   Sondra Come, MD  amiodarone (PACERONE) 200 MG tablet Take 1 tablet (200 mg total) by mouth daily. 06/01/22 07/01/22  Charise Killian, MD  apixaban (ELIQUIS) 2.5 MG TABS tablet Take 1 tablet (2.5 mg total) by mouth 2 (two) times daily. 06/14/21   Wouk, Wilfred Curtis, MD  furosemide (LASIX) 40 MG tablet Take 1 tablet (40 mg total) by mouth daily. 06/01/22 07/01/22  Charise Killian, MD  HYDROcodone-acetaminophen (NORCO/VICODIN) 5-325 MG tablet Take 1 tablet by mouth every 6 (six) hours as needed for moderate pain. 04/12/22   [provider]  metoprolol succinate (TOPROL-XL) 25 MG 24 hr tablet Take 0.5 tablets (12.5 mg total) by mouth daily. 06/01/22 07/01/22  Charise Killian, MD  simvastatin (ZOCOR) 40 MG tablet Take 40 mg by mouth at bedtime. 11/09/20   [provider]  Vitamin D, Ergocalciferol, (DRISDOL) 1.25 MG (50000 UNIT) CAPS capsule Take 50,000 Units by mouth once a week. 03/20/22   [provider]    Family History Family History  Problem Relation Age of Onset   CAD Mother    Kidney cancer Father    Prostate cancer Neg Hx    Bladder Cancer Neg Hx     Social History Social History   Tobacco Use   Smoking status: Never   Smokeless tobacco: Never  Vaping Use   Vaping status: Never Used  Substance Use Topics   Alcohol use: No    Alcohol/week: 0.0 standard drinks of alcohol   Drug use: No     Allergies   Patient has no known allergies.   Review of Systems Review of Systems  Cardiovascular:  Positive for leg swelling.        pacemaker  Skin:  Positive for wound.  Hematological:  Bruises/bleeds easily.       On Eliquis  All other systems reviewed and are negative.    Physical Exam Triage Vital Signs ED Triage Vitals  Encounter Vitals Group     BP 09/25/22 1331 115/75     Systolic BP Percentile --      Diastolic BP Percentile --      Pulse Rate 09/25/22 1331 84     Resp 09/25/22 1331 16     Temp 09/25/22 1331  97.6 F (36.4 C)     Temp Source 09/25/22 1331 Oral     SpO2 09/25/22 1331 94 %     Weight 09/25/22 1330 138 lb 10.7 oz (62.9 kg)     Height 09/25/22 1330 5\' 10"  (1.778 m)     Head Circumference --      Peak Flow --      Pain Score 09/25/22 1330 0     Pain Loc --      Pain Education --      Exclude from Growth Chart --    No data found.  Updated Vital Signs BP 115/75 (BP Location: Right Arm)   Pulse 84   Temp 97.6 F (36.4 C) (Oral)   Resp 16   Ht 5\' 10"  (1.778 m)   Wt 138 lb 10.7  oz (62.9 kg)   SpO2 94%   BMI 19.90 kg/m   Visual Acuity Right Eye Distance:   Left Eye Distance:   Bilateral Distance:    Right Eye Near:   Left Eye Near:    Bilateral Near:     Physical Exam Vitals and nursing note reviewed.  Cardiovascular:     Rate and Rhythm: Normal rate and regular rhythm.     Pulses: Normal pulses.     Heart sounds: Normal heart sounds.  Pulmonary:     Effort: Pulmonary effort is normal.     Breath sounds: Normal breath sounds.  Abdominal:     General: Abdomen is flat.     Palpations: Abdomen is soft.  Musculoskeletal:     Right upper leg: Swelling and edema present.     Comments: Right leg swollen with +3 pitting edema.  Right thigh measures 17.5 left thigh measures 14.25 right calf measures 15.25 left calf measures 13 inches.  Shin with wounds.  There is noted weeping of the lower leg.    Wounds do not appear to be infected.  Normal color and temperature of the leg  Neurological:     Mental Status: He is alert.      UC Treatments / Results  Labs (all labs ordered are listed, but only abnormal results are displayed) Labs Reviewed - No data to display  EKG   Radiology No results found.  Procedures Procedures (including critical care time)  Medications Ordered in UC Medications - No data to display  Initial Impression / Assessment and Plan / UC Course  I have reviewed the triage vital signs and the nursing notes.  Pertinent labs & imaging  results that were available during my care of the patient were reviewed by me and considered in my medical decision making (see chart for details).  Patient has significant history of cardiac concerns.  He does currently have a pacemaker.  He was recently hospitalized a couple months ago for an issue with congestive heart failure. Currently he denies any chest pain, shortness of breath, or pain with the swelling.  Concern for congestive heart failure exacerbation versus blood clot.  The probability of a blood clot is low due to the fact he is on Eliquis.  However, needs to be ruled out.   Will discharge to ED for further evaluation   Final Clinical Impressions(s) / UC Diagnoses   Final diagnoses:  Leg edema  Left leg swelling  Acute on chronic congestive heart failure, unspecified heart failure type [facility])     Discharge Instructions      Please present to ED on discharge from urgent care to rule out blood clot or worsening of congestive heart failure     ED Prescriptions   None    PDMP not reviewed this encounter.   Nelda Marseille, NP 09/25/22 1415

## 2022-09-26 ENCOUNTER — Encounter: Payer: Self-pay | Admitting: Family

## 2022-09-26 ENCOUNTER — Ambulatory Visit: Payer: Medicare HMO | Attending: Family | Admitting: Family

## 2022-09-26 VITALS — BP 117/68 | HR 75 | Resp 16 | Wt 154.4 lb

## 2022-09-26 DIAGNOSIS — I480 Paroxysmal atrial fibrillation: Secondary | ICD-10-CM | POA: Diagnosis not present

## 2022-09-26 DIAGNOSIS — Z95810 Presence of automatic (implantable) cardiac defibrillator: Secondary | ICD-10-CM | POA: Diagnosis not present

## 2022-09-26 DIAGNOSIS — N189 Chronic kidney disease, unspecified: Secondary | ICD-10-CM | POA: Diagnosis not present

## 2022-09-26 DIAGNOSIS — I10 Essential (primary) hypertension: Secondary | ICD-10-CM

## 2022-09-26 DIAGNOSIS — I130 Hypertensive heart and chronic kidney disease with heart failure and stage 1 through stage 4 chronic kidney disease, or unspecified chronic kidney disease: Secondary | ICD-10-CM | POA: Diagnosis not present

## 2022-09-26 DIAGNOSIS — I5022 Chronic systolic (congestive) heart failure: Secondary | ICD-10-CM | POA: Insufficient documentation

## 2022-09-26 DIAGNOSIS — I472 Ventricular tachycardia, unspecified: Secondary | ICD-10-CM | POA: Insufficient documentation

## 2022-09-26 DIAGNOSIS — E785 Hyperlipidemia, unspecified: Secondary | ICD-10-CM | POA: Insufficient documentation

## 2022-09-26 DIAGNOSIS — I2510 Atherosclerotic heart disease of native coronary artery without angina pectoris: Secondary | ICD-10-CM | POA: Insufficient documentation

## 2022-09-26 DIAGNOSIS — I509 Heart failure, unspecified: Secondary | ICD-10-CM | POA: Diagnosis present

## 2022-09-26 NOTE — Patient Instructions (Signed)
Labs done today, your results will be available in MyChart, we will contact you for abnormal readings.

## 2022-09-26 NOTE — Progress Notes (Signed)
PCP: Barbette Reichmann, MD (last seen 05/24) Primary Cardiologist: Marcina Millard, MD (last seen 07/24)  HPI:  Spencer Coleman is a 89 y/o male with a  history of CAD, hyperlipidemia, HTN, CKD, PAF, renal colic, patella fracture, VT with BiV ICD (08/09/07) and chronic heart failure.   The patient was seen at [facility] ED on 01/04/2021 with 2-week history of generalized weakness thought to be COVID related treated supportively. ICD interrogation revealed normally functioning dual-chamber ICD with predominant atrial and ventricular pacing with 1 paced terminated and 1 shock terminated VT/VF episode, with longevity at elective replacement indication. Seen at [facility] ED on 01/06/2021 at which time he was noted to be in wide-complex tachycardia which was felt to be consistent with atrial fibrillation with rapid ventricular rate, treated with intravenous amiodarone, converted to sinus rhythm and discharged home on Eliquis 2.5 mg twice daily and amiodarone 400 mg daily. The patient failed to start amiodarone but currently is on Eliquis 2.5 mg daily.   Underwent successful BiV ICD change 01/28/2021. ICD interrogation 04/29/2021 revealed 1 nonsustained episode of VT without device therapy.   Admitted 06/12/21 due to SOB. Needed oxygen at 2L but then weaned to room air. Initially given IV lasix with transition to oral diuretics. Cardiology and surgery consults obtained. Discharged after 2 days.    Admitted 05/12/22 due to shortness of breath. Upon arrival to the ER pt noted to be in a wide-complex tachycardia heart rate in the 140's with soft bp readings sbp 80-90's. ER provider contacted on call Cardiologist Dr. Juliann Pares who recommended 12 mg of adenosine, however pt remained tachycardic. Therefore, per Dr. Glennis Brink recommendations pt received amiodarone bolus followed by amiodarone gtt. Pt also noted to have an elevated bnp of 3,155.4 and CXR concerning for vascular congestion and tiny right effusion. Received 80 mg iv  lasix x 1 dose. Was in the ED 09/25/22 due to leg swelling over the last 4-5 days. Mostly in the right leg. Ultrasound negative for blood clot. BNP >4500. Diuresed.   Echo 06/13/21: EF of 20-25% along with moderate/severe Spencer and mild/moderate TR.  Echo 05/13/22: EF 20-25% with moderate LVH, Grade II DD, severe LAE/ RAE, severe Spencer, moderate AR/PR and mild/ moderate TR  He presents today for a follow-up visit with a chief complaint of minimal fatigue with moderate exertion. Chronic in nature. Has associated leg weakness, pedal edema, easy bruising, gradual weight loss and redness over 2 fingers on his right hand. Denies shortness of breath, chest pain, cough, palpitations, abdominal distention or difficulty sleeping.   Went to the ER yesterday because of severe swelling mostly in his right leg that went all up into his thigh. Ruled out DVT, gave IV lasix and experienced marked improvement in the swelling. Had watery blisters on his right shin that started seeping yesterday but patient doesn't notice any leakage today. 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 Not adding any salt to his food and likes to use pepper.   ROS: All systems negative except as listed in HPI, PMH and Problem List.  SH:  Social History   Socioeconomic History   Marital status: Single    Spouse name: Not on file   Number of children: Not on file   Years of education: Not on file   Highest education level: Not on file  Occupational History   Occupation: retired  Tobacco Use   Smoking status: Never   Smokeless tobacco: Never  Vaping Use  Vaping status: Never Used  Substance and Sexual Activity   Alcohol use: No    Alcohol/week: 0.0 standard drinks of alcohol   Drug use: No   Sexual activity: Not on file  Other Topics Concern   Not on file  Social History Narrative   Lives at home. Independent, no assisted devices at baseline   Social Determinants of Health   Financial Resource  Strain: Low Risk  (06/03/2022)   Received from [facility], [facility]   Overall Financial Resource Strain (CARDIA)    Difficulty of Paying Living Expenses: Not hard at all  Food Insecurity: No Food Insecurity (06/03/2022)   Received from [facility], [facility]   Hunger Vital Sign    Worried About Running Out of Food in the Last Year: Never true    Ran Out of Food in the Last Year: Never true  Transportation Needs: No Transportation Needs (06/03/2022)   Received from [facility], [facility]   [facility] - Transportation    In the past 12 months, has lack of transportation kept you from medical appointments or from getting medications?: No    Lack of Transportation (Non-Medical): No  Physical Activity: Not on file  Stress: Not on file  Social Connections: Not on file  Intimate Partner Violence: Not At Risk (05/16/2022)   Humiliation, Afraid, Rape, and Kick questionnaire    Fear of Current or Ex-Partner: No    Emotionally Abused: No    Physically Abused: No    Sexually Abused: No    FH:  Family History  Problem Relation Age of Onset   CAD Mother    Kidney cancer Father    Prostate cancer Neg Hx    Bladder Cancer Neg Hx     Past Medical History:  Diagnosis Date   CAD (coronary artery disease)    Nonobstructive cardiac disease by cath   CHF (congestive heart failure) (HCC)    nonischemic cardiomyopathy   CKD (chronic kidney disease)    History of kidney stones    Hyperlipidemia    Hypertension    Inguinal hernia    PAF (paroxysmal atrial fibrillation) (HCC)    Patella fracture 01/09/2015   Renal colic 10/07/2011   Ventricular tachycardia (HCC)    s/p ICD    Current Outpatient Medications  Medication Sig Dispense Refill   acetaminophen (TYLENOL) 500 MG tablet Take 2 tablets (1,000 mg total) by mouth every 6 (six) hours. 30 tablet 0   amiodarone (PACERONE) 200 MG  tablet Take 1 tablet (200 mg total) by mouth daily. 30 tablet 0   apixaban (ELIQUIS) 2.5 MG TABS tablet Take 1 tablet (2.5 mg total) by mouth 2 (two) times daily. 60 tablet 1   furosemide (LASIX) 40 MG tablet Take 1 tablet (40 mg total) by mouth daily. 30 tablet 0   HYDROcodone-acetaminophen (NORCO/VICODIN) 5-325 MG tablet Take 1 tablet by mouth every 6 (six) hours as needed for moderate pain.     metoprolol succinate (TOPROL-XL) 25 MG 24 hr tablet Take 0.5 tablets (12.5 mg total) by mouth daily. 15 tablet 0   simvastatin (ZOCOR) 40 MG tablet Take 40 mg by mouth at bedtime.     Vitamin D, Ergocalciferol, (DRISDOL) 1.25 MG (50000 UNIT) CAPS capsule Take 50,000 Units by mouth once a week.     No current facility-administered medications for this visit.   Vitals:   09/26/22 1415  BP: 117/68  Pulse: 75  Resp: 16  SpO2: 100%  Weight: 154 lb 6 oz (70 kg)   Wt Readings from Last 3 Encounters:  09/26/22 154 lb 6 oz (70 kg)  09/25/22 138 lb 10.7 oz (62.9 kg)  09/25/22 138 lb 10.7 oz (62.9 kg)   Lab Results  Component Value Date   CREATININE 1.40 (H) 09/25/2022   CREATININE 1.90 (H) 05/30/2022   CREATININE 1.58 (H) 05/23/2022   PHYSICAL EXAM:  General:  Well appearing. No resp difficulty HEENT: normal Neck: supple. JVP flat. No lymphadenopathy or thyromegaly appreciated. Cor: PMI normal. Regular rate & rhythm. No rubs, gallops or murmurs. Lungs: clear Abdomen: soft, nontender, nondistended. No hepatosplenomegaly. No bruits or masses.  Extremities: no cyanosis, clubbing, rash, 2+ pitting edema in bilateral lower legs Neuro: alert & oriented x3, cranial nerves grossly intact. Moves all 4 extremities w/o difficulty. Affect pleasant. Skin: right shin has opened and unopened blisters; some redness but no drainage; right index and middle fingers are red and slightly warm to touch  ECG: not done  ReDs: 26%   ASSESSMENT & PLAN:  1: Chronic heart failure with reduced ejection  fraction- - suspect due to HTN or valvular disease - NYHA class II - euvolemic today - weighing daily; reminded to call for an overnight weight gain of > 2 pounds or a weekly weight gain of > 5 pounds - weight down 15 pounds from last visit here 1 year ago - ReDs 26% - Echo 06/13/21: EF of 20-25% along with moderate/severe Spencer and mild/moderate TR.  - Echo 05/13/22: EF 20-25% with moderate LVH, Grade II DD, severe LAE/ RAE, severe Spencer, moderate AR/PR and mild/ moderate TR - adds "very little" salt to his food and he was encouraged to not add any - discussed the importance of elevating legs once he gets home daily from the adult day care center - with opened blisters, he may not be able to get compression socks on - continue furosemide 20mg  daily - continue metoprolol succinate 12.5mg  daily - BMP/BNP today - discussed increasing furosemide to 40mg  daily or changing it to torsemide but will get lab results back first - BNP 09/25/22 was >4500  2: HTN with CKD- - BP 117/68 - saw PCP (Hande) 05/24 - BMP 09/25/22 reviewed and showed sodium 139, potassium 4.4, creatinine 1.4 and GFR 48  3: VT- - has AICD; denies any shocking - saw cardiology (Paraschos) 07/24 - continue amiodarone 200mg  daily - continue apixaban 2.5mg  BID  Return in 3 weeks, sooner if needed.

## 2022-09-26 NOTE — Progress Notes (Signed)
   09/26/22 1443  ReDS Vest / Clip  Station Marker A  Ruler Value 25  ReDS Value Range < 36  ReDS Actual Value 26

## 2022-09-27 ENCOUNTER — Telehealth: Payer: Self-pay

## 2022-09-27 DIAGNOSIS — I5022 Chronic systolic (congestive) heart failure: Secondary | ICD-10-CM

## 2022-09-27 LAB — BASIC METABOLIC PANEL
BUN/Creatinine Ratio: 17 (ref 10–24)
BUN: 26 mg/dL (ref 10–36)
CO2: 24 mmol/L (ref 20–29)
Calcium: 8.9 mg/dL (ref 8.6–10.2)
Chloride: 103 mmol/L (ref 96–106)
Creatinine, Ser: 1.5 mg/dL — ABNORMAL HIGH (ref 0.76–1.27)
Glucose: 80 mg/dL (ref 70–99)
Potassium: 4.3 mmol/L (ref 3.5–5.2)
Sodium: 144 mmol/L (ref 134–144)
eGFR: 44 mL/min/{1.73_m2} — ABNORMAL LOW (ref >59)

## 2022-09-27 LAB — BRAIN NATRIURETIC PEPTIDE: BNP: 2713.9 pg/mL — ABNORMAL HIGH (ref 0.0–100.0)

## 2022-09-27 MED ORDER — POTASSIUM CHLORIDE ER 10 MEQ PO TBCR: 10.0000 meq | EXTENDED_RELEASE_TABLET | Freq: Every day | ORAL | 3 refills | Status: DC

## 2022-09-27 MED ORDER — FUROSEMIDE 20 MG PO TABS: 20.0000 mg | ORAL_TABLET | Freq: Two times a day (BID) | ORAL | 3 refills | Status: DC

## 2022-09-27 NOTE — Telephone Encounter (Signed)
-----   Message from Delma Freeze sent at 09/27/2022  3:52 PM EDT ----- Call sister [name] and ok to leave detailed message w/ instructions. Increase lasix to 40mg  daily and begin potassium daily. Recheck BMET in 2 weeks

## 2022-10-12 ENCOUNTER — Other Ambulatory Visit: Payer: Self-pay | Admitting: *Deleted

## 2022-10-12 DIAGNOSIS — I5022 Chronic systolic (congestive) heart failure: Secondary | ICD-10-CM

## 2022-10-13 LAB — BASIC METABOLIC PANEL
BUN/Creatinine Ratio: 18 (ref 10–24)
BUN: 30 mg/dL (ref 10–36)
CO2: 28 mmol/L (ref 20–29)
Calcium: 9.2 mg/dL (ref 8.6–10.2)
Chloride: 103 mmol/L (ref 96–106)
Creatinine, Ser: 1.67 mg/dL — ABNORMAL HIGH (ref 0.76–1.27)
Glucose: 90 mg/dL (ref 70–99)
Potassium: 4.6 mmol/L (ref 3.5–5.2)
Sodium: 146 mmol/L — ABNORMAL HIGH (ref 134–144)
eGFR: 39 mL/min/{1.73_m2} — ABNORMAL LOW (ref >59)

## 2022-10-14 NOTE — Progress Notes (Deleted)
PCP: Barbette Reichmann, MD (last seen 05/24) Primary Cardiologist: Marcina Millard, MD (last seen 07/24)  HPI:  Mr Gortney is a 89 y/o male with a  history of CAD, hyperlipidemia, HTN, CKD, PAF, renal colic, patella fracture, VT with BiV ICD (08/09/07) and chronic heart failure.   The patient was seen at [facility] ED on 01/04/2021 with 2-week history of generalized weakness thought to be COVID related treated supportively. ICD interrogation revealed normally functioning dual-chamber ICD with predominant atrial and ventricular pacing with 1 paced terminated and 1 shock terminated VT/VF episode, with longevity at elective replacement indication. Seen at [facility] ED on 01/06/2021 at which time he was noted to be in wide-complex tachycardia which was felt to be consistent with atrial fibrillation with rapid ventricular rate, treated with intravenous amiodarone, converted to sinus rhythm and discharged home on Eliquis 2.5 mg twice daily and amiodarone 400 mg daily. The patient failed to start amiodarone but currently is on Eliquis 2.5 mg daily.   Underwent successful BiV ICD change 01/28/2021. ICD interrogation 04/29/2021 revealed 1 nonsustained episode of VT without device therapy.   Admitted 06/12/21 due to SOB. Needed oxygen at 2L but then weaned to room air. Initially given IV lasix with transition to oral diuretics. Cardiology and surgery consults obtained. Discharged after 2 days.    Admitted 05/12/22 due to shortness of breath. Upon arrival to the ER pt noted to be in a wide-complex tachycardia heart rate in the 140's with soft bp readings sbp 80-90's. ER provider contacted on call Cardiologist Dr. Juliann Pares who recommended 12 mg of adenosine, however pt remained tachycardic. Therefore, per Dr. Glennis Brink recommendations pt received amiodarone bolus followed by amiodarone gtt. Pt also noted to have an elevated bnp of 3,155.4 and CXR concerning for vascular congestion and tiny right effusion. Received 80 mg iv  lasix x 1 dose. Was in the ED 09/25/22 due to leg swelling over the last 4-5 days. Mostly in the right leg. Ultrasound negative for blood clot. BNP >4500. Diuresed.   Echo 06/13/21: EF of 20-25% along with moderate/severe MR and mild/moderate TR.  Echo 05/13/22: EF 20-25% with moderate LVH, Grade II DD, severe LAE/ RAE, severe MR, moderate AR/PR and mild/ moderate TR  He presents today for a follow-up visit with a chief complaint of minimal fatigue with moderate exertion. Chronic in nature. Has associated leg weakness, pedal edema, easy bruising, gradual weight loss and redness over 2 fingers on his right hand. Denies shortness of breath, chest pain, cough, palpitations, abdominal distention or difficulty sleeping.   Went to the ER yesterday because of severe swelling mostly in his right leg that went all up into his thigh. Ruled out DVT, gave IV lasix and experienced marked improvement in the swelling. Had watery blisters on his right shin that started seeping yesterday but patient doesn't notice any leakage today. 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 Not adding any salt to his food and likes to use pepper.   ROS: All systems negative except as listed in HPI, PMH and Problem List.  SH:  Social History   Socioeconomic History   Marital status: Divorced    Spouse name: Not on file   Number of children: Not on file   Years of education: Not on file   Highest education level: Not on file  Occupational History   Occupation: retired  Tobacco Use   Smoking status: Never   Smokeless tobacco: Never  Vaping Use  Vaping status: Never Used  Substance and Sexual Activity   Alcohol use: No    Alcohol/week: 0.0 standard drinks of alcohol   Drug use: No   Sexual activity: Not on file  Other Topics Concern   Not on file  Social History Narrative   Lives at home. Independent, no assisted devices at baseline   Social Determinants of Health   Financial Resource  Strain: Low Risk  (06/03/2022)   Received from [facility], [facility]   Overall Financial Resource Strain (CARDIA)    Difficulty of Paying Living Expenses: Not hard at all  Food Insecurity: No Food Insecurity (06/03/2022)   Received from [facility], [facility]   Hunger Vital Sign    Worried About Running Out of Food in the Last Year: Never true    Ran Out of Food in the Last Year: Never true  Transportation Needs: No Transportation Needs (06/03/2022)   Received from [facility], [facility]   [facility] - Transportation    In the past 12 months, has lack of transportation kept you from medical appointments or from getting medications?: No    Lack of Transportation (Non-Medical): No  Physical Activity: Not on file  Stress: Not on file  Social Connections: Not on file  Intimate Partner Violence: Not At Risk (05/16/2022)   Humiliation, Afraid, Rape, and Kick questionnaire    Fear of Current or Ex-Partner: No    Emotionally Abused: No    Physically Abused: No    Sexually Abused: No    FH:  Family History  Problem Relation Age of Onset   CAD Mother    Kidney cancer Father    Prostate cancer Neg Hx    Bladder Cancer Neg Hx     Past Medical History:  Diagnosis Date   CAD (coronary artery disease)    Nonobstructive cardiac disease by cath   CHF (congestive heart failure) (HCC)    nonischemic cardiomyopathy   CKD (chronic kidney disease)    History of kidney stones    Hyperlipidemia    Hypertension    Inguinal hernia    PAF (paroxysmal atrial fibrillation) (HCC)    Patella fracture 01/09/2015   Renal colic 10/07/2011   Ventricular tachycardia (HCC)    s/p ICD    Current Outpatient Medications  Medication Sig Dispense Refill   acetaminophen (TYLENOL) 500 MG tablet Take 2 tablets (1,000 mg total) by mouth every 6 (six) hours. 30 tablet 0   amiodarone (PACERONE) 200 MG  tablet Take 1 tablet (200 mg total) by mouth daily. 30 tablet 0   apixaban (ELIQUIS) 2.5 MG TABS tablet Take 1 tablet (2.5 mg total) by mouth 2 (two) times daily. 60 tablet 1   cyanocobalamin (VITAMIN B12) 250 MCG tablet Take 250 mcg by mouth daily.     furosemide (LASIX) 20 MG tablet Take 1 tablet (20 mg total) by mouth 2 (two) times daily. One in AM and one in PM 180 tablet 3   metoprolol succinate (TOPROL-XL) 25 MG 24 hr tablet Take 0.5 tablets (12.5 mg total) by mouth daily. 15 tablet 0   potassium chloride (KLOR-CON) 10 MEQ tablet Take 1 tablet (10 mEq total) by mouth daily. 90 tablet 3   simvastatin (ZOCOR) 40 MG tablet Take 40 mg by mouth at bedtime.     Vitamin D, Ergocalciferol, (DRISDOL) 1.25 MG (50000 UNIT) CAPS capsule Take 50,000 Units by mouth once a week.  No current facility-administered medications for this visit.   There were no vitals filed for this visit.  Wt Readings from Last 3 Encounters:  09/26/22 154 lb 6 oz (70 kg)  09/25/22 138 lb 10.7 oz (62.9 kg)  09/25/22 138 lb 10.7 oz (62.9 kg)   Lab Results  Component Value Date   CREATININE 1.67 (H) 10/12/2022   CREATININE 1.50 (H) 09/26/2022   CREATININE 1.40 (H) 09/25/2022   PHYSICAL EXAM:  General:  Well appearing. No resp difficulty HEENT: normal Neck: supple. JVP flat. No lymphadenopathy or thyromegaly appreciated. Cor: PMI normal. Regular rate & rhythm. No rubs, gallops or murmurs. Lungs: clear Abdomen: soft, nontender, nondistended. No hepatosplenomegaly. No bruits or masses.  Extremities: no cyanosis, clubbing, rash, 2+ pitting edema in bilateral lower legs Neuro: alert & oriented x3, cranial nerves grossly intact. Moves all 4 extremities w/o difficulty. Affect pleasant. Skin: right shin has opened and unopened blisters; some redness but no drainage; right index and middle fingers are red and slightly warm to touch  ECG: not done  ReDs: 26%   ASSESSMENT & PLAN:  1: Chronic heart failure with  reduced ejection fraction- - suspect due to HTN or valvular disease - NYHA class II - euvolemic today - weighing daily; reminded to call for an overnight weight gain of > 2 pounds or a weekly weight gain of > 5 pounds - weight down 15 pounds from last visit here 1 year ago - ReDs 26% - Echo 06/13/21: EF of 20-25% along with moderate/severe MR and mild/moderate TR.  - Echo 05/13/22: EF 20-25% with moderate LVH, Grade II DD, severe LAE/ RAE, severe MR, moderate AR/PR and mild/ moderate TR - adds "very little" salt to his food and he was encouraged to not add any - discussed the importance of elevating legs once he gets home daily from the adult day care center - with opened blisters, he may not be able to get compression socks on - continue furosemide 20mg  daily - continue metoprolol succinate 12.5mg  daily - BMP/BNP today - discussed increasing furosemide to 40mg  daily or changing it to torsemide but will get lab results back first - BNP 09/25/22 was >4500  2: HTN with CKD- - BP 117/68 - saw PCP (Hande) 05/24 - BMP 09/25/22 reviewed and showed sodium 139, potassium 4.4, creatinine 1.4 and GFR 48  3: VT- - has AICD; denies any shocking - saw cardiology (Paraschos) 07/24 - continue amiodarone 200mg  daily - continue apixaban 2.5mg  BID  Return in 3 weeks, sooner if needed.

## 2022-10-17 ENCOUNTER — Encounter: Payer: Medicare HMO | Admitting: Family

## 2022-10-18 NOTE — Progress Notes (Unsigned)
PCP: Barbette Reichmann, MD (last seen 10/24) Primary Cardiologist: Marcina Millard, MD (last seen 09/24)  HPI:  Mr Klinger is a 89 y/o male with a  history of CAD, hyperlipidemia, HTN, CKD, PAF, renal colic, patella fracture, VT with BiV ICD (08/09/07) and chronic heart failure.   The patient was seen at [facility] ED on 01/04/2021 with 2-week history of generalized weakness thought to be COVID related treated supportively. ICD interrogation revealed normally functioning dual-chamber ICD with predominant atrial and ventricular pacing with 1 paced terminated and 1 shock terminated VT/VF episode, with longevity at elective replacement indication. Seen at [facility] ED on 01/06/2021 at which time he was noted to be in wide-complex tachycardia which was felt to be consistent with atrial fibrillation with rapid ventricular rate, treated with intravenous amiodarone, converted to sinus rhythm and discharged home on Eliquis 2.5 mg twice daily and amiodarone 400 mg daily. The patient failed to start amiodarone but currently is on Eliquis 2.5 mg daily.   Underwent successful BiV ICD change 01/28/2021. ICD interrogation 04/29/2021 revealed 1 nonsustained episode of VT without device therapy.   Admitted 06/12/21 due to SOB. Needed oxygen at 2L but then weaned to room air. Initially given IV lasix with transition to oral diuretics. Cardiology and surgery consults obtained. Discharged after 2 days.    Admitted 05/12/22 due to shortness of breath. Upon arrival to the ER pt noted to be in a wide-complex tachycardia heart rate in the 140's with soft bp readings sbp 80-90's. ER provider contacted on call Cardiologist Dr. Juliann Pares who recommended 12 mg of adenosine, however pt remained tachycardic. Therefore, per Dr. Glennis Brink recommendations pt received amiodarone bolus followed by amiodarone gtt. Pt also noted to have an elevated bnp of 3,155.4 and CXR concerning for vascular congestion and tiny right effusion. Received 80 mg iv  lasix x 1 dose. Was in the ED 09/25/22 due to leg swelling over the last 4-5 days. Mostly in the right leg. Ultrasound negative for blood clot. BNP >4500. Diuresed.   Echo 06/13/21: EF of 20-25% along with moderate/severe MR and mild/moderate TR.  Echo 05/13/22: EF 20-25% with moderate LVH, Grade II DD, severe LAE/ RAE, severe MR, moderate AR/PR and mild/ moderate TR  He presents today for a HF follow-up visit with a chief complaint of minimal fatigue with moderate exertion. Has associated pedal edema along with this. Denies shortness of breath, chest pain, cough, palpitations, abdominal distention, dizziness, weight gain or difficulty sleeping.   He did fall last week getting his walkers tangled up together and he fell bruising his right arm. Says that he was getting into the bed but didn't turn the light on. Denies any pain anywhere.   At last visit, furosemide was increased to 40mg  daily with the addition of potassium daily. Patient and his sister both say that he has continued to take just 20mg  daily but is taking the potassium.   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   Unclear of exact fluid intake. Patient says that he drinks 1 Qt of water daily (32 oz) along with a cup of coffee and "a little" soda. Sister says that she feels like he drinks half of that water.   ROS: All systems negative except as listed in HPI, PMH and Problem List.  SH:  Social History   Socioeconomic History   Marital status: Divorced    Spouse name: Not on file   Number of children: Not on file  Years of education: Not on file   Highest education level: Not on file  Occupational History   Occupation: retired  Tobacco Use   Smoking status: Never   Smokeless tobacco: Never  Vaping Use   Vaping status: Never Used  Substance and Sexual Activity   Alcohol use: No    Alcohol/week: 0.0 standard drinks of alcohol   Drug use: No   Sexual activity: Not on file  Other Topics  Concern   Not on file  Social History Narrative   Lives at home. Independent, no assisted devices at baseline   Social Determinants of Health   Financial Resource Strain: Low Risk  (06/03/2022)   Received from [facility], [facility]   Overall Financial Resource Strain (CARDIA)    Difficulty of Paying Living Expenses: Not hard at all  Food Insecurity: No Food Insecurity (06/03/2022)   Received from [facility], [facility]   Hunger Vital Sign    Worried About Running Out of Food in the Last Year: Never true    Ran Out of Food in the Last Year: Never true  Transportation Needs: No Transportation Needs (06/03/2022)   Received from [facility], [facility]   [facility] - Transportation    In the past 12 months, has lack of transportation kept you from medical appointments or from getting medications?: No    Lack of Transportation (Non-Medical): No  Physical Activity: Not on file  Stress: Not on file  Social Connections: Not on file  Intimate Partner Violence: Not At Risk (05/16/2022)   Humiliation, Afraid, Rape, and Kick questionnaire    Fear of Current or Ex-Partner: No    Emotionally Abused: No    Physically Abused: No    Sexually Abused: No    FH:  Family History  Problem Relation Age of Onset   CAD Mother    Kidney cancer Father    Prostate cancer Neg Hx    Bladder Cancer Neg Hx     Past Medical History:  Diagnosis Date   CAD (coronary artery disease)    Nonobstructive cardiac disease by cath   CHF (congestive heart failure) (HCC)    nonischemic cardiomyopathy   CKD (chronic kidney disease)    History of kidney stones    Hyperlipidemia    Hypertension    Inguinal hernia    PAF (paroxysmal atrial fibrillation) (HCC)    Patella fracture 01/09/2015   Renal colic 10/07/2011   Ventricular tachycardia (HCC)    s/p ICD    Current Outpatient Medications  Medication  Sig Dispense Refill   acetaminophen (TYLENOL) 500 MG tablet Take 2 tablets (1,000 mg total) by mouth every 6 (six) hours. 30 tablet 0   amiodarone (PACERONE) 200 MG tablet Take 1 tablet (200 mg total) by mouth daily. 30 tablet 0   apixaban (ELIQUIS) 2.5 MG TABS tablet Take 1 tablet (2.5 mg total) by mouth 2 (two) times daily. 60 tablet 1   cyanocobalamin (VITAMIN B12) 250 MCG tablet Take 250 mcg by mouth daily.     furosemide (LASIX) 20 MG tablet Take 1 tablet (20 mg total) by mouth 2 (two) times daily. One in AM and one in PM 180 tablet 3   metoprolol succinate (TOPROL-XL) 25 MG 24 hr tablet Take 0.5 tablets (12.5 mg total) by mouth daily. 15 tablet 0   potassium chloride (KLOR-CON) 10 MEQ tablet Take 1 tablet (10 mEq total) by mouth daily. 90  tablet 3   simvastatin (ZOCOR) 40 MG tablet Take 40 mg by mouth at bedtime.     Vitamin D, Ergocalciferol, (DRISDOL) 1.25 MG (50000 UNIT) CAPS capsule Take 50,000 Units by mouth once a week.     No current facility-administered medications for this visit.   Vitals:   10/19/22 1101  BP: 105/73  Pulse: 88  SpO2: 99%  Weight: 147 lb (66.7 kg)   Wt Readings from Last 3 Encounters:  10/19/22 147 lb (66.7 kg)  09/26/22 154 lb 6 oz (70 kg)  09/25/22 138 lb 10.7 oz (62.9 kg)   Lab Results  Component Value Date   CREATININE 1.67 (H) 10/12/2022   CREATININE 1.50 (H) 09/26/2022   CREATININE 1.40 (H) 09/25/2022     PHYSICAL EXAM:  General:  Well appearing. No resp difficulty HEENT: normal Neck: supple. JVP flat. No lymphadenopathy or thyromegaly appreciated. Cor: PMI normal. Regular rate & rhythm. No rubs, gallops or murmurs. Lungs: clear Abdomen: soft, nontender, nondistended. No hepatosplenomegaly. No bruits or masses.  Extremities: no cyanosis, clubbing, rash, 2+ pitting edema in left lower leg, 1+ pitting in right lower leg Neuro: alert & oriented x3, cranial nerves grossly intact. Moves all 4 extremities w/o difficulty. Affect  pleasant. Skin: right forearm has bruising and healing scabs from recent fall  ECG: not done  ASSESSMENT & PLAN:  1: NICM with reduced ejection fraction- - suspect due to HTN or valvular disease - NYHA class II - euvolemic today - weighing daily; reminded to call for an overnight weight gain of > 2 pounds or a weekly weight gain of > 5 pounds - weight down 7 pounds from last visit here 3 weeks ago - Echo 06/13/21: EF of 20-25% along with moderate/severe MR and mild/moderate TR.  - Echo 05/13/22: EF 20-25% with moderate LVH, Grade II DD, severe LAE/ RAE, severe MR, moderate AR/PR and mild/ moderate TR - adds "very little" salt to his food and he was encouraged to not add any - discussed the importance of elevating legs once he gets home daily from the adult day care center - increase furosemide to 40mg  daily/ continue potassium daily - continue metoprolol succinate 12.5mg  daily - repeat BMP next visit - BNP 09/25/22 was >4500  2: HTN with CKD- - BP 105/73 - saw PCP Harlon Flor) 10/24 - BMP 10/12/22 reviewed and showed sodium 146, potassium 4.6, creatinine 1.67 and GFR 39 - reviewed the importance of drinking 32 oz water daily (2 bottles daily)  3: VT- - has AICD; denies any shocking - saw cardiology (Paraschos) 09/24 - continue amiodarone 200mg  daily - continue apixaban 2.5mg  BID  Return in 3-4 weeks, sooner if needed.

## 2022-10-19 ENCOUNTER — Encounter: Payer: Self-pay | Admitting: Family

## 2022-10-19 ENCOUNTER — Ambulatory Visit: Payer: Medicare HMO | Attending: Family | Admitting: Family

## 2022-10-19 VITALS — BP 105/73 | HR 88 | Wt 147.0 lb

## 2022-10-19 DIAGNOSIS — I480 Paroxysmal atrial fibrillation: Secondary | ICD-10-CM | POA: Diagnosis not present

## 2022-10-19 DIAGNOSIS — I130 Hypertensive heart and chronic kidney disease with heart failure and stage 1 through stage 4 chronic kidney disease, or unspecified chronic kidney disease: Secondary | ICD-10-CM | POA: Diagnosis not present

## 2022-10-19 DIAGNOSIS — Z79899 Other long term (current) drug therapy: Secondary | ICD-10-CM | POA: Diagnosis not present

## 2022-10-19 DIAGNOSIS — N189 Chronic kidney disease, unspecified: Secondary | ICD-10-CM | POA: Insufficient documentation

## 2022-10-19 DIAGNOSIS — I5022 Chronic systolic (congestive) heart failure: Secondary | ICD-10-CM

## 2022-10-19 DIAGNOSIS — I509 Heart failure, unspecified: Secondary | ICD-10-CM | POA: Diagnosis not present

## 2022-10-19 DIAGNOSIS — I472 Ventricular tachycardia, unspecified: Secondary | ICD-10-CM | POA: Diagnosis not present

## 2022-10-19 DIAGNOSIS — I2510 Atherosclerotic heart disease of native coronary artery without angina pectoris: Secondary | ICD-10-CM | POA: Insufficient documentation

## 2022-10-19 DIAGNOSIS — I428 Other cardiomyopathies: Secondary | ICD-10-CM | POA: Diagnosis not present

## 2022-10-19 DIAGNOSIS — E785 Hyperlipidemia, unspecified: Secondary | ICD-10-CM | POA: Diagnosis not present

## 2022-10-19 DIAGNOSIS — R5383 Other fatigue: Secondary | ICD-10-CM | POA: Insufficient documentation

## 2022-10-19 DIAGNOSIS — Z7901 Long term (current) use of anticoagulants: Secondary | ICD-10-CM | POA: Insufficient documentation

## 2022-10-19 DIAGNOSIS — I10 Essential (primary) hypertension: Secondary | ICD-10-CM

## 2022-10-19 MED ORDER — FUROSEMIDE 40 MG PO TABS: 40.0000 mg | ORAL_TABLET | Freq: Every day | ORAL | 3 refills | Status: DC

## 2022-10-19 NOTE — Patient Instructions (Signed)
DRINK 2 BOTTLES OF WATER DAILY  FINISH TAKING YOUR CURRENT LASIX BY TAKING 2 PILLS DAILY  THEN PICK UP YOUR NEW LASIX PRESCRIPTION AND START LASIX 40 MG ONCE DAILY

## 2022-10-28 ENCOUNTER — Encounter: Payer: Self-pay | Admitting: Family

## 2022-11-16 ENCOUNTER — Encounter: Payer: Self-pay | Admitting: Family

## 2022-11-16 ENCOUNTER — Ambulatory Visit: Payer: Medicare HMO | Admitting: Family

## 2022-11-16 VITALS — BP 104/69 | HR 88 | Wt 149.0 lb

## 2022-11-16 DIAGNOSIS — I10 Essential (primary) hypertension: Secondary | ICD-10-CM | POA: Diagnosis not present

## 2022-11-16 DIAGNOSIS — E785 Hyperlipidemia, unspecified: Secondary | ICD-10-CM | POA: Insufficient documentation

## 2022-11-16 DIAGNOSIS — I130 Hypertensive heart and chronic kidney disease with heart failure and stage 1 through stage 4 chronic kidney disease, or unspecified chronic kidney disease: Secondary | ICD-10-CM | POA: Insufficient documentation

## 2022-11-16 DIAGNOSIS — I2510 Atherosclerotic heart disease of native coronary artery without angina pectoris: Secondary | ICD-10-CM | POA: Insufficient documentation

## 2022-11-16 DIAGNOSIS — Z79899 Other long term (current) drug therapy: Secondary | ICD-10-CM | POA: Insufficient documentation

## 2022-11-16 DIAGNOSIS — I5022 Chronic systolic (congestive) heart failure: Secondary | ICD-10-CM

## 2022-11-16 DIAGNOSIS — Z7901 Long term (current) use of anticoagulants: Secondary | ICD-10-CM | POA: Insufficient documentation

## 2022-11-16 DIAGNOSIS — N189 Chronic kidney disease, unspecified: Secondary | ICD-10-CM | POA: Insufficient documentation

## 2022-11-16 DIAGNOSIS — I428 Other cardiomyopathies: Secondary | ICD-10-CM | POA: Insufficient documentation

## 2022-11-16 DIAGNOSIS — R490 Dysphonia: Secondary | ICD-10-CM | POA: Insufficient documentation

## 2022-11-16 DIAGNOSIS — I480 Paroxysmal atrial fibrillation: Secondary | ICD-10-CM | POA: Insufficient documentation

## 2022-11-16 DIAGNOSIS — R7989 Other specified abnormal findings of blood chemistry: Secondary | ICD-10-CM | POA: Diagnosis not present

## 2022-11-16 DIAGNOSIS — I472 Ventricular tachycardia, unspecified: Secondary | ICD-10-CM | POA: Diagnosis not present

## 2022-11-16 NOTE — Progress Notes (Signed)
ReDS Vest / Clip - 11/16/22 1100       ReDS Vest / Clip   Station Marker C    Ruler Value 25    ReDS Value Range Low volume    ReDS Actual Value 33

## 2022-11-16 NOTE — Progress Notes (Addendum)
PCP: Barbette Reichmann, MD (last seen 10/24) Primary Cardiologist: Marcina Millard, MD (last seen 09/24)  HPI:  [name] is a 89 y/o male with a  history of CAD, hyperlipidemia, HTN, CKD, PAF, renal colic, patella fracture, VT with BiV ICD (08/09/07) and chronic heart failure.   The patient was seen at [facility] ED on 01/04/2021 with 2-week history of generalized weakness thought to be COVID related treated supportively. ICD interrogation revealed normally functioning dual-chamber ICD with predominant atrial and ventricular pacing with 1 paced terminated and 1 shock terminated VT/VF episode, with longevity at elective replacement indication. Seen at [facility] ED on 01/06/2021 at which time he was noted to be in wide-complex tachycardia which was felt to be consistent with atrial fibrillation with rapid ventricular rate, treated with intravenous amiodarone, converted to sinus rhythm and discharged home on Eliquis 2.5 mg twice daily and amiodarone 400 mg daily. The patient failed to start amiodarone but currently is on Eliquis 2.5 mg daily.   Underwent successful BiV ICD change 01/28/2021. ICD interrogation 04/29/2021 revealed 1 nonsustained episode of VT without device therapy.   Admitted 06/12/21 due to SOB. Needed oxygen at 2L but then weaned to room air. Initially given IV lasix with transition to oral diuretics. Cardiology and surgery consults obtained. Discharged after 2 days.    Admitted 05/12/22 due to shortness of breath. Upon arrival to the ER pt noted to be in a wide-complex tachycardia heart rate in the 140's with soft bp readings sbp 80-90's. ER provider contacted on call Cardiologist Dr. Juliann Pares who recommended 12 mg of adenosine, however pt remained tachycardic. Therefore, per Dr. Glennis Brink recommendations pt received amiodarone bolus followed by amiodarone gtt. Pt also noted to have an elevated bnp of 3,155.4 and CXR concerning for vascular congestion and tiny right effusion. Received 80 mg iv  lasix x 1 dose. Was in the ED 09/25/22 due to leg swelling over the last 4-5 days. Mostly in the right leg. Ultrasound negative for blood clot. BNP >4500. Diuresed.   Echo 06/13/21: EF of 20-25% along with moderate/severe MR and mild/moderate TR.  Echo 05/13/22: EF 20-25% with moderate LVH, Grade II DD, severe LAE/ RAE, severe MR, moderate AR/PR and mild/ moderate TR  He presents today for a HF follow-up visit with a chief complaint of minimal shortness of breath with moderate exertion. Chronic in nature.  Has associated hoarseness and pedal edema along with this. Denies shortness of breath, chest pain, cough, palpitations, abdominal distention, dizziness or difficulty sleeping.   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 Tried to wear compression socks but needs assistance in getting them on and someone isn't always there to help.   He's continued to take furosemide 20mg  daily instead of increasing it to 40mg  because he forgets to add the second 20mg  dose in.   ROS: All systems negative except as listed in HPI, PMH and Problem List.  SH:  Social History   Socioeconomic History   Marital status: Divorced    Spouse name: Not on file   Number of children: Not on file   Years of education: Not on file   Highest education level: Not on file  Occupational History   Occupation: retired  Tobacco Use   Smoking status: Never   Smokeless tobacco: Never  Vaping Use   Vaping status: Never Used  Substance and Sexual Activity   Alcohol use: No    Alcohol/week: 0.0 standard drinks of alcohol   Drug  use: No   Sexual activity: Not on file  Other Topics Concern   Not on file  Social History Narrative   Lives at home. Independent, no assisted devices at baseline   Social Determinants of Health   Financial Resource Strain: Low Risk  (06/03/2022)   Received from [facility], [facility]   Overall Financial Resource Strain (CARDIA)     Difficulty of Paying Living Expenses: Not hard at all  Food Insecurity: No Food Insecurity (06/03/2022)   Received from [facility], [facility]   Hunger Vital Sign    Worried About Running Out of Food in the Last Year: Never true    Ran Out of Food in the Last Year: Never true  Transportation Needs: No Transportation Needs (06/03/2022)   Received from [facility], [facility]   [facility] - Transportation    In the past 12 months, has lack of transportation kept you from medical appointments or from getting medications?: No    Lack of Transportation (Non-Medical): No  Physical Activity: Not on file  Stress: Not on file  Social Connections: Not on file  Intimate Partner Violence: Not At Risk (05/16/2022)   Humiliation, Afraid, Rape, and Kick questionnaire    Fear of Current or Ex-Partner: No    Emotionally Abused: No    Physically Abused: No    Sexually Abused: No    FH:  Family History  Problem Relation Age of Onset   CAD Mother    Kidney cancer Father    Prostate cancer Neg Hx    Bladder Cancer Neg Hx     Past Medical History:  Diagnosis Date   CAD (coronary artery disease)    Nonobstructive cardiac disease by cath   CHF (congestive heart failure) (HCC)    nonischemic cardiomyopathy   CKD (chronic kidney disease)    History of kidney stones    Hyperlipidemia    Hypertension    Inguinal hernia    PAF (paroxysmal atrial fibrillation) (HCC)    Patella fracture 01/09/2015   Renal colic 10/07/2011   Ventricular tachycardia (HCC)    s/p ICD    Current Outpatient Medications  Medication Sig Dispense Refill   amiodarone (PACERONE) 200 MG tablet Take 1 tablet (200 mg total) by mouth daily. 30 tablet 0   apixaban (ELIQUIS) 2.5 MG TABS tablet Take 1 tablet (2.5 mg total) by mouth 2 (two) times daily. 60 tablet 1   cyanocobalamin (VITAMIN B12) 250 MCG tablet Take 250 mcg by mouth daily.     furosemide  (LASIX) 40 MG tablet Take 1 tablet (40 mg total) by mouth daily. 90 tablet 3   metoprolol succinate (TOPROL-XL) 25 MG 24 hr tablet Take 0.5 tablets (12.5 mg total) by mouth daily. 15 tablet 0   potassium chloride (KLOR-CON) 10 MEQ tablet Take 1 tablet (10 mEq total) by mouth daily. 90 tablet 3   simvastatin (ZOCOR) 40 MG tablet Take 40 mg by mouth at bedtime.     Vitamin D, Ergocalciferol, (DRISDOL) 1.25 MG (50000 UNIT) CAPS capsule Take 50,000 Units by mouth once a week.     No current facility-administered medications for this visit.   Vitals:   11/16/22 1104  BP: 104/69  Pulse: 88  SpO2: 100%  Weight: 149 lb (67.6 kg)   Wt Readings from Last 3 Encounters:  11/16/22 149 lb (67.6 kg)  10/19/22 147 lb (66.7 kg)  09/26/22 154 lb 6 oz (  70 kg)   Lab Results  Component Value Date   CREATININE 1.67 (H) 10/12/2022   CREATININE 1.50 (H) 09/26/2022   CREATININE 1.40 (H) 09/25/2022     PHYSICAL EXAM:  General:  Well appearing. No resp difficulty HEENT: normal Neck: supple. JVP flat. No lymphadenopathy or thyromegaly appreciated. Cor: PMI normal. Regular rate & rhythm. No rubs, gallops or murmurs. Lungs: sporadic rhonchi throughout Abdomen: soft, nontender, nondistended. No hepatosplenomegaly. No bruits or masses.  Extremities: no cyanosis, clubbing, rash, 2+ pitting edema in left lower leg, trace pitting in right lower leg Neuro: alert & oriented x3, cranial nerves grossly intact. Moves all 4 extremities w/o difficulty. Affect pleasant.  ECG: not done  Reds: 33%  ASSESSMENT & PLAN:  1: NICM with reduced ejection fraction- - suspect due to HTN or valvular disease - NYHA class II - euvolemic today - weighing daily; reminded to call for an overnight weight gain of > 2 pounds or a weekly weight gain of > 5 pounds - weight up 2 pounds from last visit here 1 month ago - ReDs 33% - Echo 06/13/21: EF of 20-25% along with moderate/severe MR and mild/moderate TR.  - Echo 05/13/22: EF  20-25% with moderate LVH, Grade II DD, severe LAE/ RAE, severe MR, moderate AR/PR and mild/ moderate TR - adds "very little" salt to his food and he was encouraged to not add any - discussed the importance of elevating legs once he gets home daily from the adult day care center - continue potassium daily - increase furosemide to 40mg  daily; sister is going to get the 40mg  RX filled instead of asking him to double with the 20's - continue metoprolol succinate 12.5mg  daily - since he has continue to take furosemide 20mg  will do BMP next visit - BNP 09/25/22 was >4500  2: HTN with CKD- - BP 104/69 - saw PCP Harlon Flor) 10/24 - BMP 10/12/22 reviewed and showed sodium 146, potassium 4.6, creatinine 1.67 and GFR 39 - reviewed the importance of drinking 32 oz water daily (2 bottles daily)  3: VT- - has AICD; denies any shocking - saw cardiology (Paraschos) 09/24 - continue amiodarone 200mg  daily - continue apixaban 2.5mg  BID   Return in 6 weeks, sooner if needed.

## 2022-11-17 ENCOUNTER — Emergency Department: Payer: Medicare HMO

## 2022-11-17 ENCOUNTER — Other Ambulatory Visit: Payer: Self-pay

## 2022-11-17 DIAGNOSIS — Z7901 Long term (current) use of anticoagulants: Secondary | ICD-10-CM | POA: Diagnosis not present

## 2022-11-17 DIAGNOSIS — Y92009 Unspecified place in unspecified non-institutional (private) residence as the place of occurrence of the external cause: Secondary | ICD-10-CM | POA: Diagnosis not present

## 2022-11-17 DIAGNOSIS — F03A11 Unspecified dementia, mild, with agitation: Secondary | ICD-10-CM | POA: Diagnosis present

## 2022-11-17 DIAGNOSIS — Z66 Do not resuscitate: Secondary | ICD-10-CM | POA: Diagnosis present

## 2022-11-17 DIAGNOSIS — R531 Weakness: Secondary | ICD-10-CM | POA: Diagnosis present

## 2022-11-17 DIAGNOSIS — I2489 Other forms of acute ischemic heart disease: Secondary | ICD-10-CM | POA: Diagnosis present

## 2022-11-17 DIAGNOSIS — I5043 Acute on chronic combined systolic (congestive) and diastolic (congestive) heart failure: Secondary | ICD-10-CM | POA: Diagnosis present

## 2022-11-17 DIAGNOSIS — F05 Delirium due to known physiological condition: Secondary | ICD-10-CM | POA: Diagnosis not present

## 2022-11-17 DIAGNOSIS — Z8249 Family history of ischemic heart disease and other diseases of the circulatory system: Secondary | ICD-10-CM | POA: Diagnosis not present

## 2022-11-17 DIAGNOSIS — K760 Fatty (change of) liver, not elsewhere classified: Secondary | ICD-10-CM | POA: Diagnosis present

## 2022-11-17 DIAGNOSIS — E785 Hyperlipidemia, unspecified: Secondary | ICD-10-CM | POA: Diagnosis present

## 2022-11-17 DIAGNOSIS — Z95810 Presence of automatic (implantable) cardiac defibrillator: Secondary | ICD-10-CM

## 2022-11-17 DIAGNOSIS — K7200 Acute and subacute hepatic failure without coma: Secondary | ICD-10-CM | POA: Diagnosis present

## 2022-11-17 DIAGNOSIS — N179 Acute kidney failure, unspecified: Secondary | ICD-10-CM | POA: Diagnosis present

## 2022-11-17 DIAGNOSIS — J690 Pneumonitis due to inhalation of food and vomit: Secondary | ICD-10-CM

## 2022-11-17 DIAGNOSIS — N1831 Chronic kidney disease, stage 3a: Secondary | ICD-10-CM | POA: Diagnosis present

## 2022-11-17 DIAGNOSIS — R296 Repeated falls: Secondary | ICD-10-CM

## 2022-11-17 DIAGNOSIS — M6282 Rhabdomyolysis: Secondary | ICD-10-CM | POA: Diagnosis present

## 2022-11-17 DIAGNOSIS — I21A1 Myocardial infarction type 2: Secondary | ICD-10-CM | POA: Diagnosis present

## 2022-11-17 DIAGNOSIS — W06XXXA Fall from bed, initial encounter: Secondary | ICD-10-CM | POA: Diagnosis present

## 2022-11-17 DIAGNOSIS — Z9181 History of falling: Secondary | ICD-10-CM

## 2022-11-17 DIAGNOSIS — D631 Anemia in chronic kidney disease: Secondary | ICD-10-CM | POA: Diagnosis present

## 2022-11-17 DIAGNOSIS — R7989 Other specified abnormal findings of blood chemistry: Secondary | ICD-10-CM | POA: Diagnosis present

## 2022-11-17 DIAGNOSIS — K8020 Calculus of gallbladder without cholecystitis without obstruction: Secondary | ICD-10-CM | POA: Diagnosis present

## 2022-11-17 DIAGNOSIS — I420 Dilated cardiomyopathy: Secondary | ICD-10-CM | POA: Diagnosis present

## 2022-11-17 DIAGNOSIS — J189 Pneumonia, unspecified organism: Secondary | ICD-10-CM

## 2022-11-17 DIAGNOSIS — I480 Paroxysmal atrial fibrillation: Secondary | ICD-10-CM | POA: Diagnosis present

## 2022-11-17 DIAGNOSIS — W19XXXA Unspecified fall, initial encounter: Principal | ICD-10-CM

## 2022-11-17 DIAGNOSIS — R579 Shock, unspecified: Secondary | ICD-10-CM | POA: Insufficient documentation

## 2022-11-17 DIAGNOSIS — R54 Age-related physical debility: Secondary | ICD-10-CM | POA: Insufficient documentation

## 2022-11-17 DIAGNOSIS — I2510 Atherosclerotic heart disease of native coronary artery without angina pectoris: Secondary | ICD-10-CM | POA: Diagnosis present

## 2022-11-17 DIAGNOSIS — I509 Heart failure, unspecified: Principal | ICD-10-CM | POA: Insufficient documentation

## 2022-11-17 DIAGNOSIS — I5023 Acute on chronic systolic (congestive) heart failure: Secondary | ICD-10-CM | POA: Diagnosis not present

## 2022-11-17 DIAGNOSIS — S51012A Laceration without foreign body of left elbow, initial encounter: Secondary | ICD-10-CM | POA: Diagnosis present

## 2022-11-17 DIAGNOSIS — I214 Non-ST elevation (NSTEMI) myocardial infarction: Secondary | ICD-10-CM

## 2022-11-17 DIAGNOSIS — Z6821 Body mass index (BMI) 21.0-21.9, adult: Secondary | ICD-10-CM

## 2022-11-17 DIAGNOSIS — Z515 Encounter for palliative care: Secondary | ICD-10-CM | POA: Diagnosis not present

## 2022-11-17 DIAGNOSIS — Z7189 Other specified counseling: Secondary | ICD-10-CM | POA: Diagnosis not present

## 2022-11-17 DIAGNOSIS — D696 Thrombocytopenia, unspecified: Secondary | ICD-10-CM | POA: Diagnosis present

## 2022-11-17 DIAGNOSIS — I130 Hypertensive heart and chronic kidney disease with heart failure and stage 1 through stage 4 chronic kidney disease, or unspecified chronic kidney disease: Principal | ICD-10-CM | POA: Diagnosis present

## 2022-11-17 DIAGNOSIS — R5381 Other malaise: Secondary | ICD-10-CM | POA: Diagnosis present

## 2022-11-17 DIAGNOSIS — Z8051 Family history of malignant neoplasm of kidney: Secondary | ICD-10-CM

## 2022-11-17 DIAGNOSIS — I959 Hypotension, unspecified: Secondary | ICD-10-CM | POA: Diagnosis present

## 2022-11-17 DIAGNOSIS — E46 Unspecified protein-calorie malnutrition: Secondary | ICD-10-CM | POA: Diagnosis present

## 2022-11-17 DIAGNOSIS — Z79899 Other long term (current) drug therapy: Secondary | ICD-10-CM

## 2022-11-17 LAB — CBC WITH DIFFERENTIAL/PLATELET
Abs Immature Granulocytes: 0.05 10*3/uL (ref 0.00–0.07)
Basophils Absolute: 0 10*3/uL (ref 0.0–0.1)
Basophils Relative: 0 %
Eosinophils Absolute: 0 10*3/uL (ref 0.0–0.5)
Eosinophils Relative: 0 %
HCT: 36.5 % — ABNORMAL LOW (ref 39.0–52.0)
Hemoglobin: 12 g/dL — ABNORMAL LOW (ref 13.0–17.0)
Immature Granulocytes: 1 %
Lymphocytes Relative: 8 %
Lymphs Abs: 0.7 10*3/uL (ref 0.7–4.0)
MCH: 30.9 pg (ref 26.0–34.0)
MCHC: 32.9 g/dL (ref 30.0–36.0)
MCV: 94.1 fL (ref 80.0–100.0)
Monocytes Absolute: 0.3 10*3/uL (ref 0.1–1.0)
Monocytes Relative: 4 %
Neutro Abs: 7.8 10*3/uL — ABNORMAL HIGH (ref 1.7–7.7)
Neutrophils Relative %: 87 %
Platelets: 75 10*3/uL — ABNORMAL LOW (ref 150–400)
RBC: 3.88 MIL/uL — ABNORMAL LOW (ref 4.22–5.81)
RDW: 14.6 % (ref 11.5–15.5)
Smear Review: NORMAL
WBC: 8.9 10*3/uL (ref 4.0–10.5)
nRBC: 0 % (ref 0.0–0.2)

## 2022-11-17 LAB — BRAIN NATRIURETIC PEPTIDE: B Natriuretic Peptide: >4500 pg/mL — ABNORMAL HIGH (ref 0.0–100.0)

## 2022-11-17 LAB — URINALYSIS, W/ REFLEX TO CULTURE (INFECTION SUSPECTED)
Bilirubin Urine: NEGATIVE
Glucose, UA: NEGATIVE mg/dL
Ketones, ur: NEGATIVE mg/dL
Leukocytes,Ua: NEGATIVE
Nitrite: NEGATIVE
Protein, ur: 100 mg/dL — AB
Specific Gravity, Urine: 1.041 — ABNORMAL HIGH (ref 1.005–1.030)
pH: 5 (ref 5.0–8.0)

## 2022-11-17 LAB — BASIC METABOLIC PANEL
Anion gap: 9 (ref 5–15)
BUN: 37 mg/dL — ABNORMAL HIGH (ref 8–23)
CO2: 20 mmol/L — ABNORMAL LOW (ref 22–32)
Calcium: 8.2 mg/dL — ABNORMAL LOW (ref 8.9–10.3)
Chloride: 104 mmol/L (ref 98–111)
Creatinine, Ser: 1.92 mg/dL — ABNORMAL HIGH (ref 0.61–1.24)
GFR, Estimated: 33 mL/min — ABNORMAL LOW (ref >60)
Glucose, Bld: 96 mg/dL (ref 70–99)
Potassium: 4.4 mmol/L (ref 3.5–5.1)
Sodium: 133 mmol/L — ABNORMAL LOW (ref 135–145)

## 2022-11-17 LAB — AMMONIA: Ammonia: 11 umol/L (ref 9–35)

## 2022-11-17 LAB — HEPATIC FUNCTION PANEL
ALT: 1049 U/L — ABNORMAL HIGH (ref 0–44)
AST: 1751 U/L — ABNORMAL HIGH (ref 15–41)
Albumin: 3.4 g/dL — ABNORMAL LOW (ref 3.5–5.0)
Alkaline Phosphatase: 134 U/L — ABNORMAL HIGH (ref 38–126)
Bilirubin, Direct: 0.8 mg/dL — ABNORMAL HIGH (ref 0.0–0.2)
Indirect Bilirubin: 1.8 mg/dL — ABNORMAL HIGH (ref 0.3–0.9)
Total Bilirubin: 2.6 mg/dL — ABNORMAL HIGH (ref <1.2)
Total Protein: 5.9 g/dL — ABNORMAL LOW (ref 6.5–8.1)

## 2022-11-17 LAB — TROPONIN I (HIGH SENSITIVITY)
Troponin I (High Sensitivity): 476 ng/L (ref <18)
Troponin I (High Sensitivity): 356 ng/L (ref <18)

## 2022-11-17 LAB — CK: Total CK: 920 U/L — ABNORMAL HIGH (ref 49–397)

## 2022-11-17 LAB — LACTIC ACID, PLASMA: Lactic Acid, Venous: 1.8 mmol/L (ref 0.5–1.9)

## 2022-11-17 MED ORDER — VANCOMYCIN HCL IN DEXTROSE 1-5 GM/200ML-% IV SOLN
1000.0000 mg | Freq: Once | INTRAVENOUS | Status: AC
  Administered 2022-11-17: 1000 mg via INTRAVENOUS
  Filled 2022-11-17: qty 200

## 2022-11-17 MED ORDER — SODIUM CHLORIDE 0.9 % IV SOLN
2.0000 g | Freq: Once | INTRAVENOUS | Status: AC
  Administered 2022-11-17: 2 g via INTRAVENOUS
  Filled 2022-11-17: qty 12.5

## 2022-11-17 MED ORDER — SODIUM CHLORIDE 0.9 % IV BOLUS
1000.0000 mL | Freq: Once | INTRAVENOUS | Status: AC
  Administered 2022-11-17: 1000 mL via INTRAVENOUS

## 2022-11-17 MED ORDER — IOHEXOL 350 MG/ML SOLN
100.0000 mL | Freq: Once | INTRAVENOUS | Status: AC | PRN
  Administered 2022-11-17: 100 mL via INTRAVENOUS

## 2022-11-17 MED ORDER — SODIUM CHLORIDE 0.9% FLUSH
10.0000 mL | Freq: Two times a day (BID) | INTRAVENOUS | Status: DC
  Administered 2022-11-18 – 2022-11-20 (×4): 10 mL via INTRAVENOUS

## 2022-11-17 NOTE — Progress Notes (Signed)
Elink monitoring for the code sepsis protocol.  

## 2022-11-17 NOTE — ED Triage Notes (Signed)
Pt arrived via EMS from home d/t multiple falls today. Pt lives at home by himself and uses a walker to get around.

## 2022-11-17 NOTE — ED Provider Notes (Signed)
[facility] Provider Note    Event Date/Time   First MD Initiated Contact with Patient 12/04/2022 1553     (approximate)   History   Fall   HPI  Spencer Coleman is a 89 y.o. male past medical history significant for paroxysmal atrial fibrillation on Eliquis, ventricular tachycardia with ICD, hyperlipidemia, hypertension, CHF with reduced EF, presents to the emergency department following a fall.  History is provided by the patient's sister at bedside.  States that he had a fall yesterday out of bed and was unable to get up.  States that he has had falls in the past but has always been able to get up on his own.  Laid on the ground throughout the night.  EMS assisted him up this morning.  Again had another fall later this afternoon.  Denies any head injury or loss of consciousness.  States that he has been having weakness and feels like both of his legs are weak.  Does endorse swelling that has been ongoing to his right leg that has been chronic.  Evaluated in heart failure clinic yesterday and was told that he otherwise was well-appearing with no signs of extra fluid on his lungs.  Denies nausea, vomiting or diarrhea.  Denies any blood in his stool.  Denies dysuria, urinary urgency or frequency.  Denies any new changes to home medications.  Lives alone by himself.  Patient's oldest daughter is his healthcare power of attorney.     Physical Exam   Triage Vital Signs: ED Triage Vitals  Encounter Vitals Group     BP 12/06/2022 1359 (!) 99/51     Systolic BP Percentile --      Diastolic BP Percentile --      Pulse Rate 11/12/2022 1359 74     Resp 11/14/2022 1359 16     Temp 11/25/2022 1359 98.3 F (36.8 C)     Temp Source 11/15/2022 1359 Oral     SpO2 11/22/2022 1359 98 %     Weight 11/15/2022 1400 103 lb (46.7 kg)     Height 11/16/2022 1400 5\' 10"  (1.778 m)     Head Circumference --      Peak Flow --      Pain Score 12/02/2022 1400 0     Pain Loc --      Pain Education --       Exclude from Growth Chart --     Most recent vital signs: Vitals:   11/29/2022 2000 11/22/2022 2026  BP: 113/67   Pulse: 70   Resp: (!) 24   Temp:  98.9 F (37.2 C)  SpO2: 96%     Physical Exam Constitutional:      Appearance: He is well-developed.  HENT:     Head: Atraumatic.     Mouth/Throat:     Mouth: Mucous membranes are dry.  Eyes:     Extraocular Movements: Extraocular movements intact.     Conjunctiva/sclera: Conjunctivae normal.     Pupils: Pupils are equal, round, and reactive to light.  Cardiovascular:     Rate and Rhythm: Regular rhythm.  Pulmonary:     Breath sounds: Rhonchi present.     Comments: Hypoxic while sleeping to 88%, placed on 2 L nasal cannula.  Tachypneic. Abdominal:     Tenderness: There is no abdominal tenderness.  Musculoskeletal:     Cervical back: Normal range of motion and neck supple. No tenderness.     Comments: Right lower extremity pitting  edema.  Increased swelling of the right lower extremity when compared to the left (patient states this is chronic).  Mild edema to bilateral upper extremities.  Large skin tear to the left elbow  Skin:    General: Skin is warm.     Capillary Refill: Capillary refill takes less than 2 seconds.  Neurological:     Mental Status: He is alert. Mental status is at baseline.     GCS: GCS eye subscore is 4. GCS verbal subscore is 5. GCS motor subscore is 6.     Cranial Nerves: Cranial nerves 2-12 are intact.     Sensory: Sensation is intact.     Motor: Motor function is intact.     Coordination: Coordination is intact.     Comments: Gait deferred  Psychiatric:        Mood and Affect: Mood normal.     IMPRESSION / MDM / ASSESSMENT AND PLAN / ED COURSE  I reviewed the triage vital signs and the nursing notes.  Differential diagnosis including intracranial hemorrhage, electrolyte abnormality, infectious process, CHF, ACS, pneumonia  Slightly low blood pressure on arrival, afebrile and 98% on room  air.   EKG  I, Corena Herter, the attending physician, personally viewed and interpreted this ECG.  LVH with strain - RBBB, QTc 525.  Negative Sgarbossa's criteria.  Multiple EKGs in the past of different morphology.  No significant change when compared to prior EKG.  Does not meet STEMI criteria.  No tachycardic or bradycardic dysrhythmias while on cardiac telemetry.  RADIOLOGY I independently reviewed imaging, my interpretation of imaging: CT scan of the head without signs of intracranial hemorrhage  CTA of the chest without obvious central pulmonary embolism.  Concern for right lower lobe pneumonia.  No obvious gallbladder dilation.   LABS (all labs ordered are listed, but only abnormal results are displayed) Labs interpreted as -    Labs Reviewed  CBC WITH DIFFERENTIAL/PLATELET - Abnormal; Notable for the following components:      Result Value   RBC 3.88 (*)    Hemoglobin 12.0 (*)    HCT 36.5 (*)    Platelets 75 (*)    Neutro Abs 7.8 (*)    All other components within normal limits  BASIC METABOLIC PANEL - Abnormal; Notable for the following components:   Sodium 133 (*)    CO2 20 (*)    BUN 37 (*)    Creatinine, Ser 1.92 (*)    Calcium 8.2 (*)    GFR, Estimated 33 (*)    All other components within normal limits  CK - Abnormal; Notable for the following components:   Total CK 920 (*)    All other components within normal limits  HEPATIC FUNCTION PANEL - Abnormal; Notable for the following components:   Total Protein 5.9 (*)    Albumin 3.4 (*)    AST 1,751 (*)    ALT 1,049 (*)    Alkaline Phosphatase 134 (*)    Total Bilirubin 2.6 (*)    Bilirubin, Direct 0.8 (*)    Indirect Bilirubin 1.8 (*)    All other components within normal limits  URINALYSIS, W/ REFLEX TO CULTURE (INFECTION SUSPECTED) - Abnormal; Notable for the following components:   Color, Urine AMBER (*)    APPearance CLOUDY (*)    Specific Gravity, Urine 1.041 (*)    Hgb urine dipstick LARGE (*)     Protein, ur 100 (*)    Bacteria, UA RARE (*)    All  other components within normal limits  BRAIN NATRIURETIC PEPTIDE - Abnormal; Notable for the following components:   B Natriuretic Peptide >4,500.0 (*)    All other components within normal limits  BLOOD GAS, VENOUS - Abnormal; Notable for the following components:   Acid-base deficit 4.1 (*)    All other components within normal limits  TROPONIN I (HIGH SENSITIVITY) - Abnormal; Notable for the following components:   Troponin I (High Sensitivity) 476 (*)    All other components within normal limits  TROPONIN I (HIGH SENSITIVITY) - Abnormal; Notable for the following components:   Troponin I (High Sensitivity) 356 (*)    All other components within normal limits  CULTURE, BLOOD (ROUTINE X 2)  CULTURE, BLOOD (ROUTINE X 2)  AMMONIA  LACTIC ACID, PLASMA  LACTIC ACID, PLASMA     MDM  Patient found to have multiple lab work abnormalities.  No significant leukocytosis.  Does have anemia but his hemoglobin is stable and close to his baseline.  Thrombocytopenia with platelets of 75 with noted normal morphology.  Does have a history of thrombocytopenia as low as 128 in the past.  Significantly elevated LFTs in the 1000's and a mildly elevated T. bili.  Troponin significantly elevated at 400, CK elevated at 900.  Significantly elevated BNP.  Felt 30 cc/kg of IV fluids would be detrimental to the patient given his history of heart failure, ordered 1 L bolus -received 200 cc, blood pressure improved after small bolus.  Will hold off on any further IV fluids at this time.  On CT scan concern for right lower lobe pneumonia.  Delaying antibiotics, difficult IV stick requiring ultrasound-guided IV.  Started on broad-spectrum antibiotics given concern for pneumonia and multiorgan failure.  Concerned that the patient is having acute on chronic heart failure exacerbation that is causing hepatic congestion complicated by likely aspiration  pneumonia.  Patient received 200 bolus.  However given his significantly elevated BNP and findings of anasarca with pleural effusion on CT scan we will hold off on any further fluids at this time.  Will hold off on any Lasix given his soft blood pressure.  Consulted hospitalist for admission.     PROCEDURES:  Critical Care performed: yes  Ultrasound ED Peripheral IV (Provider)  Date/Time: 12/06/2022 8:42 PM  Performed by: Corena Herter, MD Authorized by: Corena Herter, MD   Procedure details:    Indications: multiple failed IV attempts     Skin Prep: chlorhexidine gluconate     Location:  Left AC   Angiocath:  20 G   Bedside Ultrasound Guided: Yes     Images: not archived     Patient tolerated procedure without complications: Yes     Dressing applied: Yes   .Critical Care  Performed by: Corena Herter, MD Authorized by: Corena Herter, MD   Critical care provider statement:    Critical care time (minutes):  75   Critical care time was exclusive of:  Separately billable procedures and treating other patients   Critical care was necessary to treat or prevent imminent or life-threatening deterioration of the following conditions:  Metabolic crisis and hepatic failure   Critical care was time spent personally by me on the following activities:  Development of treatment plan with patient or surrogate, discussions with consultants, evaluation of patient's response to treatment, examination of patient, ordering and review of laboratory studies, ordering and review of radiographic studies, ordering and performing treatments and interventions, pulse oximetry, re-evaluation of patient's condition and review of old charts .  Critical Care  Performed by: Corena Herter, MD Authorized by: Corena Herter, MD   Critical care provider statement:    Critical care time (minutes):  80   Critical care time was exclusive of:  Separately billable procedures and treating other patients   Critical  care was necessary to treat or prevent imminent or life-threatening deterioration of the following conditions:  Circulatory failure and hepatic failure   Critical care was time spent personally by me on the following activities:  Development of treatment plan with patient or surrogate, discussions with consultants, evaluation of patient's response to treatment, examination of patient, ordering and review of laboratory studies, ordering and review of radiographic studies, ordering and performing treatments and interventions, pulse oximetry, re-evaluation of patient's condition and review of old charts   Care discussed with: admitting provider     Patient's presentation is most consistent with acute presentation with potential threat to life or bodily function.   MEDICATIONS ORDERED IN ED: Medications  sodium chloride flush (NS) 0.9 % injection 10 mL (10 mLs Intravenous Not Given 11/11/2022 2346)  sodium chloride 0.9 % bolus 1,000 mL (0 mLs Intravenous Stopped 12/06/2022 2353)  iohexol (OMNIPAQUE) 350 MG/ML injection 100 mL (100 mLs Intravenous Contrast Given 12/04/2022 2101)  vancomycin (VANCOCIN) IVPB 1000 mg/200 mL premix (1,000 mg Intravenous New Bag/Given 12/05/2022 2346)  ceFEPIme (MAXIPIME) 2 g in sodium chloride 0.9 % 100 mL IVPB (0 g Intravenous Stopped 11/22/2022 2346)    FINAL CLINICAL IMPRESSION(S) / ED DIAGNOSES   Final diagnoses:  Fall, initial encounter  Acute on chronic systolic congestive heart failure (HCC)  Elevated LFTs  Elevated troponin  Pneumonia of right lower lobe due to infectious organism     Rx / DC Orders   ED Discharge Orders     None        Note:  This document was prepared using Dragon voice recognition software and may include unintentional dictation errors.   Corena Herter, MD 11/18/22 [phone]

## 2022-11-17 NOTE — Progress Notes (Signed)
CODE SEPSIS - PHARMACY COMMUNICATION  **Broad Spectrum Antibiotics should be administered within 1 hour of Sepsis diagnosis**  Time Code Sepsis Called/Page Received: 11/7 @ 2232   Antibiotics Ordered: Cefepime, Vancomycin   Time of 1st antibiotic administration: Cefepime 2 gm IV X 1 on 11/7 @ 2256  Additional action taken by pharmacy:   If necessary, Name of Provider/Nurse Contacted:     Mikaila Grunert D ,PharmD Clinical Pharmacist  11/17/2022  11:21 PM

## 2022-11-17 NOTE — ED Notes (Signed)
CRITICAL LAB RESULT  Troponin 476  Dr. Arnoldo Morale notified [phone]

## 2022-11-17 NOTE — Progress Notes (Signed)
PHARMACY -  BRIEF ANTIBIOTIC NOTE   Pharmacy has received consult(s) for Cefepime, Vancomycin from an ED provider.  The patient's profile has been reviewed for ht/wt/allergies/indication/available labs.    One time order(s) placed for Vancomycin 1 gm IV X 1 and Cefepime 2 gm IV X 1  Further antibiotics/pharmacy consults should be ordered by admitting physician if indicated.                       Thank you, Iain Sawchuk D 11/17/2022  10:36 PM

## 2022-11-17 NOTE — ED Notes (Signed)
Dr. Arnoldo Morale states she will place an [place] IV for patient to have a CTA.

## 2022-11-18 ENCOUNTER — Inpatient Hospital Stay: Payer: Medicare HMO

## 2022-11-18 ENCOUNTER — Other Ambulatory Visit: Payer: Self-pay

## 2022-11-18 ENCOUNTER — Inpatient Hospital Stay (HOSPITAL_COMMUNITY)
Admit: 2022-11-18 | Discharge: 2022-11-18 | Disposition: A | Discharge status: Home / Self Care | Payer: Medicare HMO | Attending: Internal Medicine | Admitting: Internal Medicine

## 2022-11-18 DIAGNOSIS — K8020 Calculus of gallbladder without cholecystitis without obstruction: Secondary | ICD-10-CM | POA: Diagnosis not present

## 2022-11-18 DIAGNOSIS — Z515 Encounter for palliative care: Secondary | ICD-10-CM

## 2022-11-18 DIAGNOSIS — I5043 Acute on chronic combined systolic (congestive) and diastolic (congestive) heart failure: Secondary | ICD-10-CM

## 2022-11-18 DIAGNOSIS — R7989 Other specified abnormal findings of blood chemistry: Secondary | ICD-10-CM | POA: Diagnosis not present

## 2022-11-18 DIAGNOSIS — W19XXXA Unspecified fall, initial encounter: Secondary | ICD-10-CM

## 2022-11-18 DIAGNOSIS — Z7901 Long term (current) use of anticoagulants: Secondary | ICD-10-CM

## 2022-11-18 DIAGNOSIS — J189 Pneumonia, unspecified organism: Secondary | ICD-10-CM

## 2022-11-18 DIAGNOSIS — I5023 Acute on chronic systolic (congestive) heart failure: Secondary | ICD-10-CM | POA: Diagnosis not present

## 2022-11-18 DIAGNOSIS — Z7189 Other specified counseling: Secondary | ICD-10-CM

## 2022-11-18 LAB — CBC
HCT: 38.8 % — ABNORMAL LOW (ref 39.0–52.0)
Hemoglobin: 12.4 g/dL — ABNORMAL LOW (ref 13.0–17.0)
MCH: 30.2 pg (ref 26.0–34.0)
MCHC: 32 g/dL (ref 30.0–36.0)
MCV: 94.6 fL (ref 80.0–100.0)
Platelets: 70 10*3/uL — ABNORMAL LOW (ref 150–400)
RBC: 4.1 MIL/uL — ABNORMAL LOW (ref 4.22–5.81)
RDW: 14.5 % (ref 11.5–15.5)
WBC: 7.7 10*3/uL (ref 4.0–10.5)
nRBC: 0 % (ref 0.0–0.2)

## 2022-11-18 LAB — ECHOCARDIOGRAM COMPLETE
AR max vel: 1.86 cm2
AV Area VTI: 1.91 cm2
AV Area mean vel: 2.07 cm2
AV Mean grad: 6 mm[Hg]
AV Peak grad: 12.1 mm[Hg]
Ao pk vel: 1.74 m/s
Area-P 1/2: 4.6 cm2
Calc EF: 25.6 %
Height: 70 in
MV M vel: 5.2 m/s
MV Peak grad: 108.2 mm[Hg]
MV VTI: 2.11 cm2
S' Lateral: 6.4 cm
Single Plane A2C EF: 38.3 %
Single Plane A4C EF: 20.8 %
Weight: 1648 [oz_av]

## 2022-11-18 LAB — BLOOD GAS, VENOUS
Acid-Base Excess: 1.6 mmol/L (ref 0.0–2.0)
Bicarbonate: 28.9 mmol/L — ABNORMAL HIGH (ref 20.0–28.0)
O2 Saturation: 25.1 %
Patient temperature: 37
pCO2, Ven: 56 mm[Hg] (ref 44–60)
pH, Ven: 7.32 (ref 7.25–7.43)
pO2, Ven: 34 mm[Hg] (ref 32–45)

## 2022-11-18 LAB — COMPREHENSIVE METABOLIC PANEL
ALT: 1433 U/L — ABNORMAL HIGH (ref 0–44)
AST: 2087 U/L — ABNORMAL HIGH (ref 15–41)
Albumin: 3 g/dL — ABNORMAL LOW (ref 3.5–5.0)
Alkaline Phosphatase: 109 U/L (ref 38–126)
Anion gap: 8 (ref 5–15)
BUN: 37 mg/dL — ABNORMAL HIGH (ref 8–23)
CO2: 26 mmol/L (ref 22–32)
Calcium: 8.3 mg/dL — ABNORMAL LOW (ref 8.9–10.3)
Chloride: 102 mmol/L (ref 98–111)
Creatinine, Ser: 1.71 mg/dL — ABNORMAL HIGH (ref 0.61–1.24)
GFR, Estimated: 38 mL/min — ABNORMAL LOW (ref >60)
Glucose, Bld: 87 mg/dL (ref 70–99)
Potassium: 4.6 mmol/L (ref 3.5–5.1)
Sodium: 136 mmol/L (ref 135–145)
Total Bilirubin: 1.8 mg/dL — ABNORMAL HIGH (ref <1.2)
Total Protein: 5.2 g/dL — ABNORMAL LOW (ref 6.5–8.1)

## 2022-11-18 LAB — STREP PNEUMONIAE URINARY ANTIGEN: Strep Pneumo Urinary Antigen: NEGATIVE

## 2022-11-18 LAB — APTT
aPTT: 40 s — ABNORMAL HIGH (ref 24–36)
aPTT: 33 s (ref 24–36)
aPTT: 60 s — ABNORMAL HIGH (ref 24–36)

## 2022-11-18 LAB — CK: Total CK: 692 U/L — ABNORMAL HIGH (ref 49–397)

## 2022-11-18 LAB — HEPARIN LEVEL (UNFRACTIONATED)
Heparin Unfractionated: 0.27 [IU]/mL — ABNORMAL LOW (ref 0.30–0.70)
Heparin Unfractionated: 0.31 [IU]/mL (ref 0.30–0.70)
Heparin Unfractionated: 0.3 [IU]/mL (ref 0.30–0.70)

## 2022-11-18 LAB — PROTIME-INR
INR: 1.5 — ABNORMAL HIGH (ref 0.8–1.2)
Prothrombin Time: 18.3 s — ABNORMAL HIGH (ref 11.4–15.2)

## 2022-11-18 LAB — MAGNESIUM: Magnesium: 2.1 mg/dL (ref 1.7–2.4)

## 2022-11-18 MED ORDER — FUROSEMIDE 10 MG/ML IJ SOLN
20.0000 mg | Freq: Two times a day (BID) | INTRAMUSCULAR | Status: DC
Start: 2022-11-18 — End: 2022-11-18
  Administered 2022-11-18: 20 mg via INTRAVENOUS
  Filled 2022-11-18: qty 4

## 2022-11-18 MED ORDER — DIAZEPAM 5 MG/ML IJ SOLN
2.5000 mg | Freq: Once | INTRAMUSCULAR | Status: AC
  Administered 2022-11-18: 2.5 mg via INTRAVENOUS
  Filled 2022-11-18: qty 2

## 2022-11-18 MED ORDER — ACETAMINOPHEN 325 MG PO TABS: 650.0000 mg | ORAL_TABLET | Freq: Four times a day (QID) | ORAL | Status: DC | PRN

## 2022-11-18 MED ORDER — VANCOMYCIN HCL IN DEXTROSE 1-5 GM/200ML-% IV SOLN: 1000.0000 mg | INTRAVENOUS | Status: DC

## 2022-11-18 MED ORDER — VANCOMYCIN HCL 1250 MG/250ML IV SOLN
1250.0000 mg | INTRAVENOUS | Status: DC
  Administered 2022-11-19: 1250 mg via INTRAVENOUS
  Filled 2022-11-18: qty 250

## 2022-11-18 MED ORDER — AMIODARONE HCL 200 MG PO TABS: 200.0000 mg | ORAL_TABLET | Freq: Every day | ORAL | Status: DC

## 2022-11-18 MED ORDER — HEPARIN (PORCINE) 25000 UT/250ML-% IV SOLN
950.0000 [IU]/h | INTRAVENOUS | Status: DC
  Administered 2022-11-18: 550 [IU]/h via INTRAVENOUS
  Administered 2022-11-19: 950 [IU]/h via INTRAVENOUS
  Filled 2022-11-18 (×2): qty 250

## 2022-11-18 MED ORDER — MORPHINE SULFATE (PF) 2 MG/ML IV SOLN: 1.0000 mg | Freq: Once | INTRAVENOUS | Status: DC

## 2022-11-18 MED ORDER — ONDANSETRON HCL 4 MG/2ML IJ SOLN: 4.0000 mg | Freq: Four times a day (QID) | INTRAMUSCULAR | Status: DC | PRN

## 2022-11-18 MED ORDER — SIMVASTATIN 10 MG PO TABS
40.0000 mg | ORAL_TABLET | Freq: Every day | ORAL | Status: DC
  Administered 2022-11-18: 40 mg via ORAL
  Filled 2022-11-18: qty 4

## 2022-11-18 MED ORDER — ACETAMINOPHEN 650 MG RE SUPP: 650.0000 mg | Freq: Four times a day (QID) | RECTAL | Status: DC | PRN

## 2022-11-18 MED ORDER — ONDANSETRON HCL 4 MG PO TABS: 4.0000 mg | ORAL_TABLET | Freq: Four times a day (QID) | ORAL | Status: DC | PRN

## 2022-11-18 MED ORDER — SODIUM CHLORIDE 0.9 % IV SOLN
2.0000 g | INTRAVENOUS | Status: DC
  Administered 2022-11-18 – 2022-11-20 (×2): 2 g via INTRAVENOUS
  Filled 2022-11-18 (×3): qty 12.5

## 2022-11-18 MED ORDER — FUROSEMIDE 10 MG/ML IJ SOLN
80.0000 mg | Freq: Two times a day (BID) | INTRAMUSCULAR | Status: DC
  Administered 2022-11-18 – 2022-11-20 (×5): 80 mg via INTRAVENOUS
  Filled 2022-11-18 (×5): qty 8

## 2022-11-18 NOTE — Assessment & Plan Note (Addendum)
History of ventricular tachycardia s/p AICD Chronic anticoagulation Continue amiodarone.  Holding Eliquis due to heparin infusion  continue metoprolol if BP allows

## 2022-11-18 NOTE — ED Notes (Signed)
Patient is altered. Pulled out third IV that was placed by Ultrasound. New 20G iv placed in the right upper arm and secured with coband.

## 2022-11-18 NOTE — ED Notes (Signed)
Dr. Myriam Forehand notified pt's HR is now 110, BP is 90/62, and he is afebrile. MD also notified speech is requesting TOC/SW consult.

## 2022-11-18 NOTE — Evaluation (Signed)
Physical Therapy Evaluation Patient Details Name: Spencer Coleman MRN: [id number] DOB: 1930-01-24 Today's Date: 11/18/2022  History of Present Illness  presented to ER secondary to progressive weakness, multiple falls; admitted for management of acute/chronic respiratory failure, aspiration pneumonia  Clinical Impression  Patient resting in bed upon arrival to session; alert and oriented to self, location, unaware of date or reason for admission.   Does follow simple commands, but somewhat impulsive with limited insight into deficits and overall safety needs.  Denies acute pain.  Generally weak throughout all extremities, but no focal weakness appreciated.  Currently requiring cga/min assist for bed mobility; min/mod assist for sit/stand, standing balance and bed/chair transfer with RW. Requires assist for lift off, standing balance; very short, shuffling steps. Limited balance reactions, activity tolerance. Does require use of RW and +1 to optimize safety.  Additional gait deferred this time due to global weakness/unsteadiness and general fatigue. Of note, sats >90% on 2L throughout session with mild exertional activity. Would benefit from skilled PT to address above deficits and promote optimal return to PLOF.; recommend post-acute PT follow up as indicated by interdisciplinary care team.          If plan is discharge home, recommend the following: A lot of help with walking and/or transfers;A lot of help with bathing/dressing/bathroom   Can travel by private vehicle   No    Equipment Recommendations    Recommendations for Other Services       Functional Status Assessment Patient has had a recent decline in their functional status and demonstrates the ability to make significant improvements in function in a reasonable and predictable amount of time.     Precautions / Restrictions Precautions Precautions: Fall Restrictions Weight Bearing Restrictions: No      Mobility  Bed  Mobility Overal bed mobility: Needs Assistance Bed Mobility: Supine to Sit     Supine to sit: Contact guard     General bed mobility comments: increased time/effort, use of HOB/bedrails to complete    Transfers Overall transfer level: Needs assistance   Transfers: Sit to/from Stand, Bed to chair/wheelchair/BSC Sit to Stand: Min assist, Mod assist Stand pivot transfers: Min assist, Mod assist         General transfer comment: assist for lift off, standing balance; very short, shuffling steps. Limited balance reactions, activity tolerance.  Does require use of RW and +1 to optimize safety    Ambulation/Gait               General Gait Details: deferred this date  Stairs            Wheelchair Mobility     Tilt Bed    Modified Rankin (Stroke Patients Only)       Balance Overall balance assessment: Needs assistance Sitting-balance support: No upper extremity supported, Feet supported Sitting balance-Leahy Scale: Good     Standing balance support: Bilateral upper extremity supported Standing balance-Leahy Scale: Poor                               Pertinent Vitals/Pain Pain Assessment Pain Assessment: No/denies pain    Home Living Family/patient expects to be discharged to:: Private residence Living Arrangements: Alone Available Help at Discharge: Family;Available PRN/intermittently Type of Home: House Home Access: Level entry   Entrance Stairs-Number of Steps: 1   Home Layout: One level Home Equipment: Grab bars - toilet;Rollator (4 wheels);Rolling Walker (2 wheels) Additional Comments: Patient unreliable historian;  information taken from previous documentation, will verify with family as available    Prior Function Prior Level of Function : Independent/Modified Independent             Mobility Comments: Ambulatory for some household distances with RW/4WRW; also utilizes manual WC at times.  Patient unreliable historian;  information taken from previous documentation vs patient report, will verify with family as available       Extremity/Trunk Assessment   Upper Extremity Assessment Upper Extremity Assessment: Generalized weakness    Lower Extremity Assessment Lower Extremity Assessment: Generalized weakness (grossly 4-/5 throughout; mild edema R > L LE)       Communication      Cognition Arousal: Alert Behavior During Therapy: Impulsive Overall Cognitive Status: No family/caregiver present to determine baseline cognitive functioning                                 General Comments: Alert and oriented to self, location as hospital; unaware of reason for admission; limited insight into deficits and overall need for assist        General Comments      Exercises     Assessment/Plan    PT Assessment Patient needs continued PT services  PT Problem List Decreased strength;Decreased range of motion;Decreased activity tolerance;Decreased balance;Decreased mobility;Decreased cognition;Decreased knowledge of use of DME;Decreased safety awareness;Decreased knowledge of precautions       PT Treatment Interventions DME instruction;Gait training;Functional mobility training;Balance training;Therapeutic exercise;Therapeutic activities;Cognitive remediation;Patient/family education    PT Goals (Current goals can be found in the Care Plan section)  Acute Rehab PT Goals Patient Stated Goal: agreeable to OOB to chair PT Goal Formulation: With patient Time For Goal Achievement: 12/02/22 Potential to Achieve Goals: Good    Frequency Min 1X/week     Co-evaluation               AM-PAC PT "6 Clicks" Mobility  Outcome Measure Help needed turning from your back to your side while in a flat bed without using bedrails?: None Help needed moving from lying on your back to sitting on the side of a flat bed without using bedrails?: A Little Help needed moving to and from a bed to a chair  (including a wheelchair)?: A Lot Help needed standing up from a chair using your arms (e.g., wheelchair or bedside chair)?: A Lot Help needed to walk in hospital room?: A Lot Help needed climbing 3-5 steps with a railing? : A Lot 6 Click Score: 15    End of Session   Activity Tolerance: Patient tolerated treatment well Patient left: in chair;with call bell/phone within reach;with chair alarm set Nurse Communication: Mobility status PT Visit Diagnosis: Repeated falls (R29.6);Difficulty in walking, not elsewhere classified (R26.2);Muscle weakness (generalized) (M62.81)    Time: 1545-1610 PT Time Calculation (min) (ACUTE ONLY): 25 min   Charges:   PT Evaluation $PT Eval Moderate Complexity: 1 Mod   PT General Charges $$ ACUTE PT VISIT: 1 Visit         Ahman Dugdale H. Manson Passey, PT, DPT, NCS 11/18/22, 8:33 PM [phone]

## 2022-11-18 NOTE — Progress Notes (Signed)
PHARMACY - ANTICOAGULATION CONSULT NOTE  Pharmacy Consult for Heparin  Indication: chest pain/ACS  No Known Allergies  Patient Measurements: Height: 5\' 10"  (177.8 cm) Weight: 46.7 kg (103 lb) IBW/kg (Calculated) : 73 Heparin Dosing Weight: 46.7 kg   Vital Signs: Temp: 98.3 F (36.8 C) (11/08 0123) Temp Source: Oral (11/08 0123) BP: 117/68 (11/08 0100) Pulse Rate: 69 (11/08 0100)  Labs: Recent Labs    12/05/2022 1404 11/28/2022 1718 12/04/2022 2249  HGB 12.0*  --   --   HCT 36.5*  --   --   PLT 75*  --   --   CREATININE 1.92*  --   --   CKTOTAL  --  920*  --   TROPONINIHS  --  476* 356*    Estimated Creatinine Clearance: 16.9 mL/min (A) (by C-G formula based on SCr of 1.92 mg/dL (H)).   Medical History: Past Medical History:  Diagnosis Date   CAD (coronary artery disease)    Nonobstructive cardiac disease by cath   CHF (congestive heart failure) (HCC)    nonischemic cardiomyopathy   CKD (chronic kidney disease)    History of kidney stones    Hyperlipidemia    Hypertension    Inguinal hernia    PAF (paroxysmal atrial fibrillation) (HCC)    Patella fracture 01/09/2015   Renal colic 10/07/2011   Ventricular tachycardia (HCC)    s/p ICD    Medications:  (Not in a hospital admission)   Assessment: Pharmacy consulted to dose heparin in this 89 year old male admitted with ACS/NSTEMI.  Pt was on Eliquis 2.5 mg PO BID PTA, unsure of last dose.  CrCl = 16.9 ml/min   Goal of Therapy:  Heparin level 0.3-0.7 units/ml aPTT 66 -102  seconds Monitor platelets by anticoagulation protocol: Yes   Plan:  - No bolus per MD request Start heparin infusion at 550 units/hr - Will use aPTT to guide dosing until correlating with HL  - Will draw aPTT 8 hrs after start of drip - Will draw HL on 11/9 with AM labs   Romey Cohea D 11/18/2022,1:36 AM

## 2022-11-18 NOTE — Assessment & Plan Note (Signed)
Frequent falls/frailty/physical deconditioning Patient lives independently and ambulates with walker PT and TOC consults

## 2022-11-18 NOTE — Consult Note (Signed)
Consultation  Referring Provider:    Hospitalist  Admit date: 12/08/2022 Consult date: 11/18/2022         Reason for Consultation: Abnormal LFT's              HPI:   Spencer Coleman is a 89 y.o. gentleman with history of HFrEF (EF 20-25%), CAD, CKD, and hypertension who presents after multiple falls and subsequently found to have severely elevated AST/ALT and possible cholecystitis. Patient is alert and oriented x 3 but is noted to have some mild dementia. He does live alone. He notes falling after standing up too quickly and crawling to get help. He denies any fevers, chills, or abdominal pain. No alcohol use. No significant tylenol use. No past history of liver disease. He does have a consistently elevated indirect hyperbilirubinemia. CT imaging with no dilated bile ducts. Ultrasound with 2 mm bile duct. Alk phos normal today but AST/ALT in the thousands.  Past Medical History:  Diagnosis Date   CAD (coronary artery disease)    Nonobstructive cardiac disease by cath   CHF (congestive heart failure) (HCC)    nonischemic cardiomyopathy   CKD (chronic kidney disease)    History of kidney stones    Hyperlipidemia    Hypertension    Inguinal hernia    PAF (paroxysmal atrial fibrillation) (HCC)    Patella fracture 01/09/2015   Renal colic 10/07/2011   Ventricular tachycardia [facility])    s/p ICD    Past Surgical History:  Procedure Laterality Date   CARDIAC CATHETERIZATION     CARDIAC DEFIBRILLATOR PLACEMENT  2009   CYSTOSCOPY WITH LITHOLAPAXY N/A 02/21/2018   Procedure: CYSTOSCOPY WITH LITHOLAPAXY;  Surgeon: Sondra Come, MD;  Location: [facility] ORS;  Service: Urology;  Laterality: N/A;   ELBOW SURGERY Left    HOLMIUM LASER APPLICATION N/A 02/21/2018   Procedure: HOLMIUM LASER APPLICATION;  Surgeon: Sondra Come, MD;  Location: [facility] ORS;  Service: Urology;  Laterality: N/A;   ICD GENERATOR CHANGEOUT N/A 01/28/2021   Procedure: ICD GENERATOR CHANGEOUT;  Surgeon: Marcina Millard, MD;   Location: [facility] INVASIVE CV LAB;  Service: Cardiovascular;  Laterality: N/A;   INGUINAL HERNIA REPAIR     LITHOTRIPSY     ORIF left wrist fracture Left    ORIF PATELLA Left 01/10/2015   Procedure: OPEN REDUCTION INTERNAL (ORIF) FIXATION PATELLA;  Surgeon: Donato Heinz, MD;  Location: [facility] ORS;  Service: Orthopedics;  Laterality: Left;   ORIF PATELLA Left 02/06/2015   Procedure: OPEN REDUCTION INTERNAL (ORIF) FIXATION PATELLA;  Surgeon: Donato Heinz, MD;  Location: [facility] ORS;  Service: Orthopedics;  Laterality: Left;   TRANSURETHRAL RESECTION OF PROSTATE N/A 02/21/2018   Procedure: TRANSURETHRAL RESECTION OF THE PROSTATE (TURP);  Surgeon: Sondra Come, MD;  Location: [facility] ORS;  Service: Urology;  Laterality: N/A;    Family History  Problem Relation Age of Onset   CAD Mother    Kidney cancer Father    Prostate cancer Neg Hx    Bladder Cancer Neg Hx     Social History   Tobacco Use   Smoking status: Never   Smokeless tobacco: Never  Vaping Use   Vaping status: Never Used  Substance Use Topics   Alcohol use: No    Alcohol/week: 0.0 standard drinks of alcohol   Drug use: No    Prior to Admission medications   Medication Sig Start Date End Date Taking? Authorizing Provider  amiodarone (PACERONE) 400 MG tablet Take 400 mg by mouth daily.  Yes [provider]  apixaban (ELIQUIS) 2.5 MG TABS tablet Take 1 tablet (2.5 mg total) by mouth 2 (two) times daily. 06/14/21  Yes Wouk, Wilfred Curtis, MD  cyanocobalamin (VITAMIN B12) 250 MCG tablet Take 250 mcg by mouth daily.   Yes [provider]  furosemide (LASIX) 40 MG tablet Take 1 tablet (40 mg total) by mouth daily. 10/19/22 01/17/23 Yes Clarisa Kindred A, FNP  metoprolol succinate (TOPROL-XL) 25 MG 24 hr tablet Take 0.5 tablets (12.5 mg total) by mouth daily. 06/01/22  Yes Charise Killian, MD  potassium chloride (KLOR-CON) 10 MEQ tablet Take 1 tablet (10 mEq total) by mouth daily. 09/27/22 12/26/22 Yes Hackney, Inetta Fermo A,  FNP  simvastatin (ZOCOR) 20 MG tablet Take 20 mg by mouth daily at 6 PM.   Yes [provider]  Vitamin D, Ergocalciferol, (DRISDOL) 1.25 MG (50000 UNIT) CAPS capsule Take 50,000 Units by mouth once a week. 03/20/22  Yes [provider]  amiodarone (PACERONE) 200 MG tablet Take 1 tablet (200 mg total) by mouth daily. Patient not taking: Reported on 11/18/2022 06/01/22   Charise Killian, MD    Current Facility-Administered Medications  Medication Dose Route Frequency Provider Last Rate Last Admin   acetaminophen (TYLENOL) tablet 650 mg  650 mg Oral Q6H PRN Andris Baumann, MD       Or   acetaminophen (TYLENOL) suppository 650 mg  650 mg Rectal Q6H PRN Andris Baumann, MD       ceFEPIme (MAXIPIME) 2 g in sodium chloride 0.9 % 100 mL IVPB  2 g Intravenous Q24H Andris Baumann, MD       furosemide (LASIX) injection 80 mg  80 mg Intravenous Q12H Custovic, Sabina, DO   80 mg at 11/18/22 1427   heparin ADULT infusion 100 units/mL (25000 units/294mL)  750 Units/hr Intravenous Continuous Orson Aloe, RPH 7.5 mL/hr at 11/18/22 1149 750 Units/hr at 11/18/22 1149   ondansetron (ZOFRAN) tablet 4 mg  4 mg Oral Q6H PRN Andris Baumann, MD       Or   ondansetron [facility]) injection 4 mg  4 mg Intravenous Q6H PRN Andris Baumann, MD       sodium chloride flush (NS) 0.9 % injection 10 mL  10 mL Intravenous Q12H Corena Herter, MD   10 mL at 11/18/22 0943   [START ON 11/19/2022] vancomycin (VANCOREADY) IVPB 1250 mg/250 mL  1,250 mg Intravenous Q48H Orson Aloe, [facility]       Current Outpatient Medications  Medication Sig Dispense Refill   amiodarone (PACERONE) 400 MG tablet Take 400 mg by mouth daily.     apixaban (ELIQUIS) 2.5 MG TABS tablet Take 1 tablet (2.5 mg total) by mouth 2 (two) times daily. 60 tablet 1   cyanocobalamin (VITAMIN B12) 250 MCG tablet Take 250 mcg by mouth daily.     furosemide (LASIX) 40 MG tablet Take 1 tablet (40 mg total) by mouth daily. 90 tablet 3    metoprolol succinate (TOPROL-XL) 25 MG 24 hr tablet Take 0.5 tablets (12.5 mg total) by mouth daily. 15 tablet 0   potassium chloride (KLOR-CON) 10 MEQ tablet Take 1 tablet (10 mEq total) by mouth daily. 90 tablet 3   simvastatin (ZOCOR) 20 MG tablet Take 20 mg by mouth daily at 6 PM.     Vitamin D, Ergocalciferol, (DRISDOL) 1.25 MG (50000 UNIT) CAPS capsule Take 50,000 Units by mouth once a week.     amiodarone (PACERONE) 200 MG tablet  Take 1 tablet (200 mg total) by mouth daily. (Patient not taking: Reported on 11/18/2022) 30 tablet 0    Allergies as of 11/18/2022   (No Known Allergies)     Review of Systems:    All systems reviewed and negative except where noted in HPI.  Review of Systems  Constitutional:  Negative for chills and fever.  Cardiovascular:  Negative for chest pain.  Gastrointestinal:  Negative for abdominal pain, blood in stool, constipation, diarrhea, heartburn, melena, nausea and vomiting.  Musculoskeletal:  Positive for falls.  Skin:  Negative for rash.  Neurological:  Negative for focal weakness.  Psychiatric/Behavioral:  Negative for substance abuse.   All other systems reviewed and are negative.      Physical Exam:  Vital signs in last 24 hours: Temp:  [97.9 F (36.6 C)-98.9 F (37.2 C)] 97.9 F (36.6 C) (11/08 1152) Pulse Rate:  [69-110] 71 (11/08 1307) Resp:  [16-28] 16 (11/08 1307) BP: (90-132)/(62-78) 102/63 (11/08 1307) SpO2:  [93 %-100 %] 99 % (11/08 1307)   General:   Pleasant in NAD Head:  Normocephalic and atraumatic. Eyes:   No icterus.   Conjunctiva pink. Mouth: Mucosa pink moist, no lesions. Neck:  Supple; no masses felt Lungs: No respiratory distress Abdomen:   Flat, soft, nondistended, non-tender Msk:  No clubbing or cyanosis.  Neurologic:  Alert and  oriented x4;  No focal deficits Skin:  Warm, dry, pink without significant lesions or rashes. Psych:  Alert and cooperative. Normal affect.  LAB RESULTS: Recent Labs     11/14/2022 1404 11/18/22 0454  WBC 8.9 7.7  HGB 12.0* 12.4*  HCT 36.5* 38.8*  PLT 75* 70*   BMET Recent Labs    12/08/2022 1404 11/18/22 0454  NA 133* 136  K 4.4 4.6  CL 104 102  CO2 20* 26  GLUCOSE 96 87  BUN 37* 37*  CREATININE 1.92* 1.71*  CALCIUM 8.2* 8.3*   LFT Recent Labs    12/10/2022 1718 11/18/22 0454  PROT 5.9* 5.2*  ALBUMIN 3.4* 3.0*  AST 1,751* 2,087*  ALT 1,049* 1,433*  ALKPHOS 134* 109  BILITOT 2.6* 1.8*  BILIDIR 0.8*  --   IBILI 1.8*  --    PT/INR Recent Labs    11/18/22 0215  LABPROT 18.3*  INR 1.5*    STUDIES: DG Swallowing Func-Speech Pathology  Result Date: 11/18/2022 Table formatting from the original result was not included. Modified Barium Swallow Study Patient Details Name: JALIEN JANSKY MRN: [id number] Date of Birth: 1930-08-10 Today's Date: 11/18/2022 HPI/PMH: Pt is a 89 y.o. male past medical history significant for Dementia, paroxysmal atrial fibrillation on Eliquis, ventricular tachycardia with ICD, hyperlipidemia, hypertension, CHF with reduced EF, presents to the emergency department following a fall.  History is provided by the patient's sister at bedside.  States that he had a fall yesterday out of bed and was unable to get up.  States that he has had falls in the past but has always been able to get up on his own.  Laid on the ground throughout the night.  EMS assisted him up this morning.  Again had another fall later this afternoon.  Pt lives at home Alone.  CT of Chest: Right-sided volume loss with elevation of the right hemidiaphragm  and right middle and lower lobar collapse and consolidation.  Superimposed extensive airway impaction within the right lower lobe  and, to a lesser extent, right middle lobe and peribronchial  nodularity which may relate to superimposed changes aspiration  or  acute infection. Trace right pleural effusion.  3. Mild to moderate global cardiomegaly with left ventricular  dilation. Extensive multi-vessel coronary  artery calcification.  Morphologic changes in keeping with pulmonary arterial hypertension.  Head CT: No evidence of acute infarct, hemorrhage, mass, mass effect,  or midline shift. No hydrocephalus or extra-axial fluid collection.  Age related cerebral atrophy. Clinical Impression Patient presents with functional oropharyngeal swallowing for Age. NO aspiration nor laryngeal penetration noted w/ any consistency. Pt fed self w/ setup support. Intermittent verbal cues given; baseline Cognitive decline/Dementia dx per chart. Oral stage is characterized by adequate lip closure, but min disorganized bolus preparation and containment followed by min decreased control of liquid boluses during anterior to posterior transit. Trace+ oral residue noted diffusely in oral cavity on tongue and upper denture plate; a f/u Dry swallow cleared this residue. Min increased mastication time/effort noted w/ soft solid d/t impact of Lacking Bottom Dentition (upper denture plate only).  Swallow initiation occurs at the level of the valleculae w/ majority of trials; thin liquids spilled inconsistently from the valleculae. Pharyngeal stage is noted for mildly reduced tongue base retraction (likely age-related), adequate hyolaryngeal excursion, and adequate pharyngeal constriction. Epiglottic deflection is complete; there is NO penetration nor aspiration. There is trace-min BOT, valleculae and intermittently aeryepiglottic folds/pyriform sinus residue which cleared w/ f/u Dry swallow. Pharyngeal stripping wave is complete. Amplitude/duration of cricopharyngeus opening is WFL. There is adequate/complete clearance through the cervical esophagus. No retrograde activity noted in the cervical esophagus.  Consistencies tested were thin liquids x2 tsps, 3 cup sip, 2-3 sequential sips slowly, nectar x1 tsp, 1 cup sip, honey x1 tsp, pudding x1 tsp, regular solid (1/2 graham cracker with pudding). Would recommend Pills given in a Puree for the  increased viscosity and d/t advanced Age of pt. Recommend continue a farily regular/mech soft diet consistency with thin liquids VIA CUP; general aspiration precautions including SMALL, SINGLE sips SLOWLY and moistening meats/foods for easier mastication. Recommend a Dry swallow after bites and sips -- BUT pt already does a Dry swallow independently.  No further ST services indicated.  IF it is felt that he could have REFLUX activty contributing to risk for aspiration of retrograde, Reflux activity, the recommend f/u w/ GI for assessment.  MD and NSG made aware of the MBSS results and recommendations. Factors that may increase risk of adverse event in presence of aspiration Rubye Oaks [employer] Clearance Coots 2021): Factors that may increase risk of adverse event in presence of aspiration Rubye Oaks & Clearance Coots 2021): Poor general health and/or compromised immunity; Reduced cognitive function; Frail or deconditioned (Lacking Bottom Dentition) Recommendations/Plan: Swallowing Evaluation Recommendations Swallowing Evaluation Recommendations Recommendations: PO diet PO Diet Recommendation: Dysphagia 3 (Mechanical soft); Thin liquids (Level 0) Liquid Administration via: Cup; No straw Medication Administration: Whole meds with puree (vs Crushed) Supervision: Patient able to self-feed; Set-up assistance for safety (monitor for needs) Swallowing strategies  : Minimize environmental distractions; Slow rate; Small bites/sips; Multiple dry swallows after each bite/sip Postural changes: Position pt fully upright for meals; Stay upright 30-60 min after meals Oral care recommendations: Oral care BID (2x/day); Pt independent with oral care (staff support) Recommended consults: -- (Dietician; Palliative Care for GOC) Treatment Plan Treatment Plan Treatment recommendations: No treatment recommended at this time Follow-up recommendations: No SLP follow up Recommendations Comment: appears at his baseline Functional status assessment: Patient has not had a  recent decline in their functional status. Treatment frequency: -- (n/a) Treatment duration: -- (n/a) Interventions: Aspiration precaution training; Patient/family education Recommendations  Recommendations for follow up therapy are one component of a multi-disciplinary discharge planning process, led by the attending physician.  Recommendations may be updated based on patient status, additional functional criteria and insurance authorization. Assessment: Orofacial Exam: Orofacial Exam Oral Cavity: Oral Hygiene: WFL Oral Cavity - Dentition: Edentulous; Dentures, top (on bottom) Orofacial Anatomy: WFL Oral Motor/Sensory Function: WFL Anatomy: Anatomy: WFL Boluses Administered: Boluses Administered Boluses Administered: Thin liquids (Level 0); Mildly thick liquids (Level 2, nectar thick); Moderately thick liquids (Level 3, honey thick); Puree; Solid  Oral Impairment Domain: Oral Impairment Domain Lip Closure: No labial escape Tongue control during bolus hold: Escape to lateral buccal cavity/floor of mouth; Posterior escape of less than half of bolus Bolus preparation/mastication: Disorganized chewing/mashing with solid pieces of bolus unchewed (min d/t edentulous on bottom; top denture plate only) Bolus transport/lingual motion: Brisk tongue motion Oral residue: Trace residue lining oral structures (diffuse) Location of oral residue : Tongue (denture plate) Initiation of pharyngeal swallow : Valleculae; Posterior laryngeal surface of the epiglottis (spilling from valleculae w/ thin liquids)  Pharyngeal Impairment Domain: Pharyngeal Impairment Domain Soft palate elevation: No bolus between soft palate (SP)/pharyngeal wall (PW) Laryngeal elevation: Complete superior movement of thyroid cartilage with complete approximation of arytenoids to epiglottic petiole Anterior hyoid excursion: Complete anterior movement Epiglottic movement: Complete inversion Laryngeal vestibule closure: Complete, no air/contrast in laryngeal  vestibule Pharyngeal stripping wave : Present - diminished (functional) Pharyngeal contraction (A/P view only): N/A Pharyngoesophageal segment opening: Complete distension and complete duration, no obstruction of flow Tongue base retraction: No contrast between tongue base and posterior pharyngeal wall (PPW) Pharyngeal residue: Trace residue within or on pharyngeal structures Location of pharyngeal residue: Tongue base; Valleculae; Aryepiglottic folds (cleared w/ f/u swallow)  Esophageal Impairment Domain: Esophageal Impairment Domain Esophageal clearance upright position: Complete clearance, esophageal coating Pill: No data recorded Penetration/Aspiration Scale Score: Penetration/Aspiration Scale Score 1.  Material does not enter airway: Thin liquids (Level 0); Mildly thick liquids (Level 2, nectar thick); Moderately thick liquids (Level 3, honey thick); Puree; Solid Compensatory Strategies: Compensatory Strategies Compensatory strategies: Yes Multiple swallows: Effective (f/u dry swallow x1) Effective Multiple Swallows: Thin liquid (Level 0); Mildly thick liquid (Level 2, nectar thick); Moderately thick liquid (Level 3, honey thick); Puree; Solid   General Information: Caregiver present: No  Diet Prior to this Study: NPO   Temperature : Normal (wbc 7.7)   Respiratory Status: WFL   Supplemental O2: None (Room air)   History of Recent Intubation: No  Behavior/Cognition: Alert; Cooperative; Pleasant mood; Distractible; Requires cueing (baseline Dementia dx) Self-Feeding Abilities: Able to self-feed; Needs set-up for self-feeding Baseline vocal quality/speech: Normal Volitional Cough: Able to elicit Volitional Swallow: Able to elicit Exam Limitations: No limitations Goal Planning: Prognosis for improved oropharyngeal function: Good Barriers to Reach Goals: Cognitive deficits Barriers/Prognosis Comment: baseline Dementia Patient/Family Stated Goal: "something to drink" Consulted and agree with results and  recommendations: Patient; Nurse; Physician Pain: Pain Assessment Pain Assessment: No/denies pain End of Session: Start Time:SLP Start Time (ACUTE ONLY): 0915 Stop Time: SLP Stop Time (ACUTE ONLY): 1015 Time Calculation:SLP Time Calculation (min) (ACUTE ONLY): 60 min Charges: SLP Evaluations $ SLP Speech Visit: 1 Visit SLP Evaluations $MBS Swallow: 1 Procedure SLP visit diagnosis: SLP Visit Diagnosis: Dysphagia, unspecified (R13.10) Past Medical History: Past Medical History: Diagnosis Date  CAD (coronary artery disease)   Nonobstructive cardiac disease by cath  CHF (congestive heart failure) (HCC)   nonischemic cardiomyopathy  CKD (chronic kidney disease)   History of kidney stones   Hyperlipidemia  Hypertension   Inguinal hernia   PAF (paroxysmal atrial fibrillation) (HCC)   Patella fracture 01/09/2015  Renal colic 10/07/2011  Ventricular tachycardia [facility])   s/p ICD Past Surgical History: Past Surgical History: Procedure Laterality Date  CARDIAC CATHETERIZATION    CARDIAC DEFIBRILLATOR PLACEMENT  2009  CYSTOSCOPY WITH LITHOLAPAXY N/A 02/21/2018  Procedure: CYSTOSCOPY WITH LITHOLAPAXY;  Surgeon: Sondra Come, MD;  Location: [facility] ORS;  Service: Urology;  Laterality: N/A;  ELBOW SURGERY Left   HOLMIUM LASER APPLICATION N/A 02/21/2018  Procedure: HOLMIUM LASER APPLICATION;  Surgeon: Sondra Come, MD;  Location: [facility] ORS;  Service: Urology;  Laterality: N/A;  ICD GENERATOR CHANGEOUT N/A 01/28/2021  Procedure: ICD GENERATOR CHANGEOUT;  Surgeon: Marcina Millard, MD;  Location: [facility] IN[facility]IVE [facility] LAB;  Service: Cardiovascular;  Laterality: N/A;  INGUINAL HERNIA REPAIR    LITHOTRIPSY    ORIF left wrist fracture Left   ORIF PATELLA Left 01/10/2015  Procedure: OPEN REDUCTION INTERNAL (ORIF) FIXATION PATELLA;  Surgeon: Donato Heinz, MD;  Location: [facility] ORS;  Service: Orthopedics;  Laterality: Left;  ORIF PATELLA Left 02/06/2015  Procedure: OPEN REDUCTION INTERNAL (ORIF) FIXATION PATELLA;  Surgeon: Donato Heinz, MD;   Location: [facility] ORS;  Service: Orthopedics;  Laterality: Left;  TRANSURETHRAL RESECTION OF PROSTATE N/A 02/21/2018  Procedure: TRANSURETHRAL RESECTION OF THE PROSTATE (TURP);  Surgeon: Sondra Come, MD;  Location: [facility] ORS;  Service: Urology;  Laterality: N/A; Jerilynn Som, MS, CCC-SLP Speech Language Pathologist Rehab Services; [facility] [place] [phone] (ascom) Watson,Katherine 11/18/2022, 3:19 PM  US Abdomen Limited RUQ (LIVER/GB)  Result Date: 11/18/2022 CLINICAL DATA:  Abnormal LFTs EXAM: ULTRASOUND ABDOMEN LIMITED RIGHT UPPER QUADRANT COMPARISON:  None Available. FINDINGS: Gallbladder: Gallbladder wall thickening. Gallstones, largest measures 1.2 cm. Pericholecystic fluid. No sonographic Murphy sign noted by sonographer. Common bile duct: Diameter: 2 mm Liver: No focal lesion identified. Increased parenchymal echogenicity. Portal vein is patent on color Doppler imaging with normal direction of blood flow towards the liver. Other: None. IMPRESSION: 1. Gallstones, gallbladder wall thickening and pericholecystic fluid. Although findings are concerning for acute cholecystitis, sonographic Eulah Pont sign is negative and findings could also be secondary to systemic process. Recommend clinical correlation. 2. Hepatic steatosis. Electronically Signed   By: Allegra Lai M.D.   On: 11/18/2022 10:40   CT Angio Chest Pulmonary Embolism (PE) W or WO Contrast  Result Date: 11/23/2022 CLINICAL DATA:  Pulmonary embolism (PE) suspected, high prob RLE swelling, elevated trop, elevated LFTs, AMS, falls; Abdominal pain, acute, nonlocalized elevated LFTs and t bili EXAM: CT ANGIOGRAPHY CHEST CT ABDOMEN AND PELVIS WITH CONTRAST TECHNIQUE: Multidetector CT imaging of the chest was performed using the standard protocol during bolus administration of intravenous contrast. Multiplanar CT image reconstructions and MIPs were obtained to evaluate the vascular anatomy. Multidetector CT imaging of the abdomen and pelvis  was performed using the standard protocol during bolus administration of intravenous contrast. RADIATION DOSE REDUCTION: This exam was performed according to the departmental dose-optimization program which includes automated exposure control, adjustment of the mA and/or kV according to patient size and/or use of iterative reconstruction technique. CONTRAST:  OMNIPAQUE IOHEXOL 350 MG/ML SOLN COMPARISON:  06/12/2021 FINDINGS: CTA CHEST FINDINGS Cardiovascular: Apparent filling defects within the right middle and lower lobar pulmonary arteries are artifactual and related to pulmonary vascular redistribution of setting of consolidation. Delayed imaging on CT examination of the abdomen pelvis demonstrates patency of these structures and no intraluminal filling defect identified to suggest pulmonary embolism. The central pulmonary arteries, however, are enlarged  in keeping with changes of pulmonary arterial hypertension. Extensive multi-vessel coronary artery calcification. Extensive calcification of the aortic valve leaflets. Mild to moderate global cardiomegaly with left ventricular dilation. Left subclavian pacemaker in place with leads within the right atrium, right ventricle, and left ventricular venous outflow. No pericardial effusion. Moderate atherosclerotic calcification within the thoracic aorta. No aortic aneurysm. Mediastinum/Nodes: Visualized thyroid is unremarkable. No pathologic thoracic adenopathy. Esophagus unremarkable. Lungs/Pleura: There is right-sided volume loss with elevation of the right hemidiaphragm and right middle and lower lobar collapse and consolidation. Superimposed extensive airway impaction within the right lower lobe and, to a lesser extent, right middle lobe and peribronchial nodularity is seen which may relate to superimposed changes aspiration or acute infection. Trace right pleural effusion. No pneumothorax. Musculoskeletal: No acute bone abnormality. No lytic or blastic bone  lesion. Review of the MIP images confirms the above findings. CT ABDOMEN and PELVIS FINDINGS Hepatobiliary: Cholelithiasis without pericholecystic inflammatory changes identified. Liver unremarkable. No intra or extrahepatic biliary ductal dilation. Pancreas: Unremarkable Spleen: Unremarkable Adrenals/Urinary Tract: The adrenal glands are unremarkable. The kidneys are mildly atrophic. Multiple simple cortical and parapelvic cysts are seen within the kidneys bilaterally for which no follow-up imaging is recommended. The kidneys are otherwise unremarkable. The bladder contains a 15 mm dependently layering calculus. The bladder is not distended. Stomach/Bowel: The stomach, small bowel, and large bowel are unremarkable. The appendix is absent. No free intraperitoneal gas or fluid. Vascular/Lymphatic: Extensive aortoiliac atherosclerotic calcification. Extensive visceral arteriosclerosis. No aortic aneurysm. No pathologic adenopathy within the abdomen and pelvis. Reproductive: Prostate is unremarkable. Other: No abdominal wall hernia. Diffuse subcutaneous body wall edema is present in keeping with changes of mild anasarca. Musculoskeletal: Osseous structures are age-appropriate. Diffuse osteopenia. No acute bone abnormality. No lytic or blastic bone lesion. Review of the MIP images confirms the above findings. IMPRESSION: 1. No pulmonary embolism. 2. Right-sided volume loss with elevation of the right hemidiaphragm and right middle and lower lobar collapse and consolidation. Superimposed extensive airway impaction within the right lower lobe and, to a lesser extent, right middle lobe and peribronchial nodularity which may relate to superimposed changes aspiration or acute infection. Trace right pleural effusion. 3. Mild to moderate global cardiomegaly with left ventricular dilation. Extensive multi-vessel coronary artery calcification. Morphologic changes in keeping with pulmonary arterial hypertension. 4.  Cholelithiasis. 5. 15 mm dependently layering calculus within the bladder. 6. Mild anasarca. Aortic Atherosclerosis (ICD10-I70.0). Electronically Signed   By: Helyn Numbers M.D.   On: 11/24/2022 22:15   CT ABDOMEN PELVIS W CONTRAST  Result Date: 11/29/2022 CLINICAL DATA:  Pulmonary embolism (PE) suspected, high prob RLE swelling, elevated trop, elevated LFTs, AMS, falls; Abdominal pain, acute, nonlocalized elevated LFTs and t bili EXAM: CT ANGIOGRAPHY CHEST CT ABDOMEN AND PELVIS WITH CONTRAST TECHNIQUE: Multidetector CT imaging of the chest was performed using the standard protocol during bolus administration of intravenous contrast. Multiplanar CT image reconstructions and MIPs were obtained to evaluate the vascular anatomy. Multidetector CT imaging of the abdomen and pelvis was performed using the standard protocol during bolus administration of intravenous contrast. RADIATION DOSE REDUCTION: This exam was performed according to the departmental dose-optimization program which includes automated exposure control, adjustment of the mA and/or kV according to patient size and/or use of iterative reconstruction technique. CONTRAST:  OMNIPAQUE IOHEXOL 350 MG/ML SOLN COMPARISON:  06/12/2021 FINDINGS: CTA CHEST FINDINGS Cardiovascular: Apparent filling defects within the right middle and lower lobar pulmonary arteries are artifactual and related to pulmonary vascular redistribution of setting of consolidation.  Delayed imaging on CT examination of the abdomen pelvis demonstrates patency of these structures and no intraluminal filling defect identified to suggest pulmonary embolism. The central pulmonary arteries, however, are enlarged in keeping with changes of pulmonary arterial hypertension. Extensive multi-vessel coronary artery calcification. Extensive calcification of the aortic valve leaflets. Mild to moderate global cardiomegaly with left ventricular dilation. Left subclavian pacemaker in place with  leads within the right atrium, right ventricle, and left ventricular venous outflow. No pericardial effusion. Moderate atherosclerotic calcification within the thoracic aorta. No aortic aneurysm. Mediastinum/Nodes: Visualized thyroid is unremarkable. No pathologic thoracic adenopathy. Esophagus unremarkable. Lungs/Pleura: There is right-sided volume loss with elevation of the right hemidiaphragm and right middle and lower lobar collapse and consolidation. Superimposed extensive airway impaction within the right lower lobe and, to a lesser extent, right middle lobe and peribronchial nodularity is seen which may relate to superimposed changes aspiration or acute infection. Trace right pleural effusion. No pneumothorax. Musculoskeletal: No acute bone abnormality. No lytic or blastic bone lesion. Review of the MIP images confirms the above findings. CT ABDOMEN and PELVIS FINDINGS Hepatobiliary: Cholelithiasis without pericholecystic inflammatory changes identified. Liver unremarkable. No intra or extrahepatic biliary ductal dilation. Pancreas: Unremarkable Spleen: Unremarkable Adrenals/Urinary Tract: The adrenal glands are unremarkable. The kidneys are mildly atrophic. Multiple simple cortical and parapelvic cysts are seen within the kidneys bilaterally for which no follow-up imaging is recommended. The kidneys are otherwise unremarkable. The bladder contains a 15 mm dependently layering calculus. The bladder is not distended. Stomach/Bowel: The stomach, small bowel, and large bowel are unremarkable. The appendix is absent. No free intraperitoneal gas or fluid. Vascular/Lymphatic: Extensive aortoiliac atherosclerotic calcification. Extensive visceral arteriosclerosis. No aortic aneurysm. No pathologic adenopathy within the abdomen and pelvis. Reproductive: Prostate is unremarkable. Other: No abdominal wall hernia. Diffuse subcutaneous body wall edema is present in keeping with changes of mild anasarca. Musculoskeletal:  Osseous structures are age-appropriate. Diffuse osteopenia. No acute bone abnormality. No lytic or blastic bone lesion. Review of the MIP images confirms the above findings. IMPRESSION: 1. No pulmonary embolism. 2. Right-sided volume loss with elevation of the right hemidiaphragm and right middle and lower lobar collapse and consolidation. Superimposed extensive airway impaction within the right lower lobe and, to a lesser extent, right middle lobe and peribronchial nodularity which may relate to superimposed changes aspiration or acute infection. Trace right pleural effusion. 3. Mild to moderate global cardiomegaly with left ventricular dilation. Extensive multi-vessel coronary artery calcification. Morphologic changes in keeping with pulmonary arterial hypertension. 4. Cholelithiasis. 5. 15 mm dependently layering calculus within the bladder. 6. Mild anasarca. Aortic Atherosclerosis (ICD10-I70.0). Electronically Signed   By: Helyn Numbers M.D.   On: 11/12/2022 22:15   CT Head Wo Contrast  Result Date: 11/13/2022 CLINICAL DATA:  Multiple falls EXAM: CT HEAD WITHOUT CONTRAST CT CERVICAL SPINE WITHOUT CONTRAST TECHNIQUE: Multidetector CT imaging of the head and cervical spine was performed following the standard protocol without intravenous contrast. Multiplanar CT image reconstructions of the cervical spine were also generated. RADIATION DOSE REDUCTION: This exam was performed according to the departmental dose-optimization program which includes automated exposure control, adjustment of the mA and/or kV according to patient size and/or use of iterative reconstruction technique. COMPARISON:  05/16/2022 CT head, no prior CT cervical spine. FINDINGS: CT HEAD FINDINGS Brain: No evidence of acute infarct, hemorrhage, mass, mass effect, or midline shift. No hydrocephalus or extra-axial fluid collection. Age related cerebral atrophy. Vascular: No hyperdense vessel. Atherosclerotic calcifications in the intracranial  carotid and vertebral arteries. Skull: Negative  for fracture or focal lesion. Sinuses/Orbits: No acute finding. Chronic left lamina papyracea fracture. Other: The mastoid air cells are well aerated. CT CERVICAL SPINE FINDINGS Alignment: No traumatic listhesis. Trace anterolisthesis of to C5 on C6 and C7 on T1, which appears facet mediated. Dextrocurvature of the cervical spine may be positional. Skull base and vertebrae: No acute fracture or suspicious osseous lesion. Soft tissues and spinal canal: No prevertebral fluid or swelling. No visible canal hematoma. Disc levels: Degenerative changes in the cervical spine.No high-grade spinal canal stenosis. Upper chest: No focal pulmonary opacity or pleural effusion. IMPRESSION: 1. No acute intracranial process. 2. No acute fracture or traumatic listhesis in the cervical spine. Electronically Signed   By: Wiliam Ke M.D.   On: 12/01/2022 19:51   CT CERVICAL SPINE WO CONTRAST  Result Date: 11/16/2022 CLINICAL DATA:  Multiple falls EXAM: CT HEAD WITHOUT CONTRAST CT CERVICAL SPINE WITHOUT CONTRAST TECHNIQUE: Multidetector CT imaging of the head and cervical spine was performed following the standard protocol without intravenous contrast. Multiplanar CT image reconstructions of the cervical spine were also generated. RADIATION DOSE REDUCTION: This exam was performed according to the departmental dose-optimization program which includes automated exposure control, adjustment of the mA and/or kV according to patient size and/or use of iterative reconstruction technique. COMPARISON:  05/16/2022 CT head, no prior CT cervical spine. FINDINGS: CT HEAD FINDINGS Brain: No evidence of acute infarct, hemorrhage, mass, mass effect, or midline shift. No hydrocephalus or extra-axial fluid collection. Age related cerebral atrophy. Vascular: No hyperdense vessel. Atherosclerotic calcifications in the intracranial carotid and vertebral arteries. Skull: Negative for fracture or focal  lesion. Sinuses/Orbits: No acute finding. Chronic left lamina papyracea fracture. Other: The mastoid air cells are well aerated. CT CERVICAL SPINE FINDINGS Alignment: No traumatic listhesis. Trace anterolisthesis of to C5 on C6 and C7 on T1, which appears facet mediated. Dextrocurvature of the cervical spine may be positional. Skull base and vertebrae: No acute fracture or suspicious osseous lesion. Soft tissues and spinal canal: No prevertebral fluid or swelling. No visible canal hematoma. Disc levels: Degenerative changes in the cervical spine.No high-grade spinal canal stenosis. Upper chest: No focal pulmonary opacity or pleural effusion. IMPRESSION: 1. No acute intracranial process. 2. No acute fracture or traumatic listhesis in the cervical spine. Electronically Signed   By: Wiliam Ke M.D.   On: 12/06/2022 19:51       Impression / Plan:   89 y.o. gentleman with history of HFrEF (EF 20-25%), CAD, CKD, and hypertension who presents after multiple falls and subsequently found to have severely elevated AST/ALT that is likely due to ischemic hepatopathy as the only other causes of severe elevation would be viral hepatitis and tylenol overdose. The main treatment of ischemic hepatopathy is supportive care and maintaining normotension. The indirect hyperbilirubinemia is consistent with [place] Lone disease. Abnormal gallbladder imaging likely secondary to severely reduced EF  - supportive care, maintain normotension - will check viral hepatitis tomorrow - daily CMP's. Usually takes around 5 days for enzymes to peak before trending down so don't be surprised if they continue to increase for another day or two - will continue to follow  Please call with any questions or concerns.  Merlyn Lot MD, MPH [facility]

## 2022-11-18 NOTE — Progress Notes (Signed)
Pharmacy Antibiotic Note  Spencer Coleman is a 89 y.o. male admitted on 11/11/2022 with pneumonia.  Pharmacy has been consulted for Vanc, cefepime dosing.  Plan: Cefepime 2 gm IV X 1 given in ED on 11/7 @ 2256. Cefepime 2 gm IV Q24H ordered to start on 11/8 @ 2300.  Vancomycin 1 gm IV X 1 given in ED on 11/7 @ 2346. Vancomycin 1 gm IV Q48H ordered to start on 11/10 @ 0000.  AUC = 565.1 Vanc trough = 12.0   Height: 5\' 10"  (177.8 cm) Weight: 46.7 kg (103 lb) IBW/kg (Calculated) : 73  Temp (24hrs), Avg:98.5 F (36.9 C), Min:98.3 F (36.8 C), Max:98.9 F (37.2 C)  Recent Labs  Lab 11/19/2022 1404 12/04/2022 2249  WBC 8.9  --   CREATININE 1.92*  --   LATICACIDVEN  --  1.8    Estimated Creatinine Clearance: 16.9 mL/min (A) (by C-G formula based on SCr of 1.92 mg/dL (H)).    No Known Allergies  Antimicrobials this admission:   >>    >>   Dose adjustments this admission:   Microbiology results:  BCx:   UCx:    Sputum:    MRSA PCR:   Thank you for allowing pharmacy to be a part of this patient's care.  Ayona Yniguez D 11/18/2022 2:31 AM

## 2022-11-18 NOTE — Progress Notes (Signed)
Pharmacy Antibiotic Note  Spencer Coleman is a 89 yo M with PMH including Afib (on Eliquis), frequent falls, frailty, dementia, HLD, HTN, CHF (EF 20-25%) due to DCM s/p AICD, CKD3 is a 89 y.o. male admitted on 11/15/2022 with pneumonia. Here with chief complaint of fall and CTAP exhibits extensive airway impaction in right lung, possibly reflective of infection. Given fall and h/o dementia, there is concern for aspiration as well. No evidence of abscess or empyema so anaerobic coverage not warranted at this time unless patient worsens.  Plan: Adjust vancomycin from 1000 mg IV q48H to 1250 mg q48H Continue cefepime 2 g IV q24H If condition worsens, consider switching to Zosyn or otherwise adding anaerobic coverage Continue to monitor renal function for antibiotic dosing Follow up with cultures and MRSA PCR to potentially de-escalate as appropriate   Height: 5\' 10"  (177.8 cm) Weight: 67.6 kg (149 lb) IBW/kg (Calculated) : 73  Temp (24hrs), Avg:98.3 F (36.8 C), Min:97.9 F (36.6 C), Max:98.9 F (37.2 C)  Recent Labs  Lab 12/02/2022 1404 11/27/2022 2249 11/18/22 0454  WBC 8.9  --  7.7  CREATININE 1.92*  --  1.71*  LATICACIDVEN  --  1.8  --     Estimated Creatinine Clearance: 27.5 mL/min (A) (by C-G formula based on SCr of 1.71 mg/dL (H)).    No Known Allergies  Antimicrobials this admission: Vancomycin 11/7 >>  Cefepime 11/7 >>   Dose adjustments this admission: Vancomycin 1000 mg q48H >> 1250 mg q48H  Microbiology results: 11/7 BCx: NGTD  11/8 MRSA PCR: ordered  Thank you for allowing pharmacy to be a part of this patient's care.  Will M. Dareen Piano, PharmD Clinical Pharmacist 11/18/2022 3:24 PM

## 2022-11-18 NOTE — Progress Notes (Signed)
       CROSS COVER NOTE  NAME: Spencer Coleman MRN: [id number] DOB : 07-07-1930    Concern as stated by nurse / staff    Patient confused and combative    Pertinent findings on chart review: Patient with dementia PAF, HTN, HLD,  dilated cardiomyopathy HFrEF 20-25%, CKD. Has been declining at home, frequent falls and frail  Assessment and  Interventions   Assessment: EKG A FIB PROLONGED qt Plan: Valium 2.5 mg IV  (received 2 doses overnight)       Donnie Mesa NP [facility] Cross Cover 7pm-7am - check amion for availability Pager [phone]

## 2022-11-18 NOTE — Progress Notes (Signed)
Modified Barium Swallow Study  Patient Details  Name: Spencer Coleman MRN: [id number] Date of Birth: Aug 03, 1930  Today's Date: 11/18/2022  Modified Barium Swallow completed.  Full report located under Chart Review in the Imaging Section.  History of Present Illness Pt is a 89 y.o. male past medical history significant for Dementia, paroxysmal atrial fibrillation on Eliquis, ventricular tachycardia with ICD, hyperlipidemia, hypertension, CHF with reduced EF, presents to the emergency department following a fall.  History is provided by the patient's sister at bedside.  States that he had a fall yesterday out of bed and was unable to get up.  States that he has had falls in the past but has always been able to get up on his own.  Laid on the ground throughout the night.  EMS assisted him up this morning.  Again had another fall later this afternoon.  Pt lives at home Alone.   CT of Chest: Right-sided volume loss with elevation of the right hemidiaphragm  and right middle and lower lobar collapse and consolidation.  Superimposed extensive airway impaction within the right lower lobe  and, to a lesser extent, right middle lobe and peribronchial  nodularity which may relate to superimposed changes aspiration or  acute infection. Trace right pleural effusion.  3. Mild to moderate global cardiomegaly with left ventricular  dilation. Extensive multi-vessel coronary artery calcification.  Morphologic changes in keeping with pulmonary arterial hypertension.  Head CT: No evidence of acute infarct, hemorrhage, mass, mass effect,  or midline shift. No hydrocephalus or extra-axial fluid collection.  Age related cerebral atrophy.   Clinical Impression Patient presents with functional oropharyngeal swallowing for Age. NO aspiration nor laryngeal penetration noted w/ any consistency. Pt fed self w/ setup support. Intermittent verbal cues given; baseline Cognitive decline/Dementia dx per chart.   Oral stage is  characterized by adequate lip closure, but min disorganized bolus preparation and containment followed by min decreased control of liquid boluses during anterior to posterior transit. Trace+ oral residue noted diffusely in oral cavity on tongue and upper denture plate; a f/u Dry swallow cleared this residue. Min increased mastication time/effort noted w/ soft solid d/t impact of Lacking Bottom Dentition (upper denture plate only).  Swallow initiation occurs at the level of the valleculae w/ majority of trials; thin liquids spilled inconsistently from the valleculae.  Pharyngeal stage is noted for mildly reduced tongue base retraction (likely age-related), adequate hyolaryngeal excursion, and adequate pharyngeal constriction. Epiglottic deflection is complete; there is NO penetration nor aspiration. There is trace-min BOT, valleculae and intermittently aeryepiglottic folds/pyriform sinus residue which cleared w/ f/u Dry swallow. Pharyngeal stripping wave is complete.  Amplitude/duration of cricopharyngeus opening is WFL. There is adequate/complete clearance through the cervical esophagus. No retrograde activity noted in the cervical esophagus.    Consistencies tested were thin liquids x2 tsps, 3 cup sip, 2-3 sequential sips slowly, nectar x1 tsp, 1 cup sip, honey x1 tsp, pudding x1 tsp, regular solid (1/2 graham cracker with pudding).  Would recommend Pills given in a Puree for the increased viscosity and d/t advanced Age of pt. Recommend continue a farily regular/mech soft diet consistency with thin liquids VIA CUP; general aspiration precautions including SMALL, SINGLE sips SLOWLY and moistening meats/foods for easier mastication. Recommend a Dry swallow after bites and sips -- BUT pt already does a Dry swallow independently.  No further ST services indicated.  IF it is felt that he could have REFLUX activty contributing to risk for aspiration of retrograde, Reflux activity, the recommend f/u  w/ GI for  assessment.   MD and NSG made aware of the MBSS results and recommendations. Factors that may increase risk of adverse event in presence of aspiration Rubye Oaks & Clearance Coots 2021): Poor general health and/or compromised immunity;Reduced cognitive function;Frail or deconditioned (Lacking Bottom Dentition)  Swallow Evaluation Recommendations Recommendations: PO diet PO Diet Recommendation: Dysphagia 3 (Mechanical soft);Thin liquids (Level 0) Liquid Administration via: Cup;No straw Medication Administration: Whole meds with puree (vs Crushed) Supervision: Patient able to self-feed;Set-up assistance for safety (monitor for needs) Swallowing strategies  : Minimize environmental distractions;Slow rate;Small bites/sips;Multiple dry swallows after each bite/sip Postural changes: Position pt fully upright for meals;Stay upright 30-60 min after meals Oral care recommendations: Oral care BID (2x/day);Pt independent with oral care (staff support) Recommended consults:  (Dietician; Palliative Care for GOC)         Jerilynn Som, MS, CCC-SLP Speech Language Pathologist Rehab Services; [facility] [place] [phone] (ascom) Aasha Dina 11/18/2022,3:16 PM

## 2022-11-18 NOTE — Progress Notes (Signed)
PHARMACY - ANTICOAGULATION CONSULT NOTE  Pharmacy Consult for Heparin  Indication: chest pain/ACS  No Known Allergies  Patient Measurements: Height: 5\' 10"  (177.8 cm) Weight: 67.6 kg (149 lb) IBW/kg (Calculated) : 73 Heparin Dosing Weight: 46.7 kg   Vital Signs: Temp: 98.1 F (36.7 C) (11/08 1816) Temp Source: Oral (11/08 1816) BP: 110/74 (11/08 1816) Pulse Rate: 118 (11/08 1816)  Labs: Recent Labs    12/02/2022 1404 11/29/2022 1718 11/14/2022 2249 11/18/22 0215 11/18/22 0454 11/18/22 1007 11/18/22 1420  HGB 12.0*  --   --   --  12.4*  --   --   HCT 36.5*  --   --   --  38.8*  --   --   PLT 75*  --   --   --  70*  --   --   APTT  --   --   --  40*  --  33  --   LABPROT  --   --   --  18.3*  --   --   --   INR  --   --   --  1.5*  --   --   --   HEPARINUNFRC  --   --   --  0.27*  --   --  0.31  CREATININE 1.92*  --   --   --  1.71*  --   --   CKTOTAL  --  920*  --   --  692*  --   --   TROPONINIHS  --  476* 356*  --   --   --   --     Estimated Creatinine Clearance: 27.5 mL/min (A) (by C-G formula based on SCr of 1.71 mg/dL (H)).   Medical History: Past Medical History:  Diagnosis Date   CAD (coronary artery disease)    Nonobstructive cardiac disease by cath   CHF (congestive heart failure) (HCC)    nonischemic cardiomyopathy   CKD (chronic kidney disease)    History of kidney stones    Hyperlipidemia    Hypertension    Inguinal hernia    PAF (paroxysmal atrial fibrillation) (HCC)    Patella fracture 01/09/2015   Renal colic 10/07/2011   Ventricular tachycardia (HCC)    s/p ICD    Medications:  Eliquis 2.5 mg twice daily Last dose  Assessment: 89 yo M with PMH Afib (on Eliquis), frequent falls, frailty, dementia, HLD, HTN, CHF (EF 20-25%) due to DCM s/p AICD, CKD3 presents with ACS. His ECG exhibits atrial fibrillation and RBBB. Pharmacy consulted to dose heparin. Notably patient takes apixaban 2.5 mg twice daily prior to admission (with recent dispense  history) however baseline heparin level does not exhibit false elevation, so will titrate by antiXa level going forward. Also, patient with low baseline platelet level.  Date Time aPTT/HL  Rate/Comment 11/08 0215 40 s and HL 0.27 Subtherapeutic and correlating 11/08 1007 33 s    Subtherapeutic 11/08 1420 HL 0.31  Irrelevant lab (ordered as add-on to 1007; collected as new draw) 11/08 2014 HL 0.30  Therapeutic, but on the lower end      Baseline Labs: aPTT - 40; antiXa - 0.27; INR - 1.5; Hgb - 12.0; Plts - 75   Goal of Therapy:  Heparin level 0.3-0.7 units/ml aPTT 66 -102  seconds Monitor platelets by anticoagulation protocol: Yes   Plan:  Heparin level therapeutic but on the lower end, will increase rate to ensure patient remains within therapeutic goal Increase heparin  to 850 units/hr Check antiXa level in 8 hours after rate change Continue to monitor CBC daily while on heparin infusion.  Paulita Fujita, PharmD Clinical Pharmacist 11/18/2022 8:44 PM

## 2022-11-18 NOTE — Consult Note (Signed)
Consultation Note Date: 11/18/2022   Patient Name: Spencer Coleman  DOB: [birth year]  MRN: [id number]  Age / Sex: 89 y.o., male  PCP: Barbette Reichmann, MD Referring Physician: Lurene Shadow, MD  Reason for Consultation: Establishing goals of care  HPI/Patient Profile: 89 y.o. 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  Diagnosed with acute on chronic CHF.  Follow-up echo pending.  Patient also found to have significantly elevated liver enzymes.  Patient being treated for aspiration pneumonia as well.  Also found to have elevated troponins and rhabdomyolysis.  Also complicating care is acute renal failure and thrombocytopenia.  PMT consulted to discuss goals of care.  Clinical Assessment and Goals of Care: I have reviewed medical records including EPIC notes, labs and imaging, received report from bedside staff, assessed the patient and then met with patient to discuss diagnosis prognosis, GOC, EOL wishes, disposition and options.  Patient was seen multiple times by palliative medicine team in May 2024 -extensive review of those multiple notes.  During that time DNR/DNI status was established.  Daughter [name] was established as HCPOA -her name and number listed in demographics.  At that time, main concern was disposition as family was concerned patient would not be safe at home however ultimately he was discharged home.  Patient lives alone.  Went to see patient.  He is pleasant, alert and oriented. No signs of distress. Able to make needs known.  He has no complaints.  Conversation mostly revolves around his requests for dinner. Attempted discussion regarding current situation however he is unaware why  he is in the hospital.  I shared with him the medical team is worried about his heart and he agrees with me but is unable to contribute to conversation.  He is able to confirm that his oldest daughter [name] helps him with medical decisions and agrees to me calling her.  Attempted to call [name] however no answer -left callback number.  Will request PMT provider follow-up over the weekend.  Primary Decision Maker HCPOA - daughter [name] (this was confirmed with patient, daughter, and multiple other family members during patient's lengthy hospital stay May 2024 - apparently there are documents but have not been filed to [employer])  At this time does not seem patient is able to independently make medical decisions    SUMMARY OF RECOMMENDATIONS   - continue goals of care discussions with daughter when she is available - PMT to f/u over weekend - Per previous PMT meetings, DNR/DNI and daughter [name] as [place] (family has stated previously they have documents to support HCPOA)  Code Status/Advance Care Planning: DNR     Primary Diagnoses: Present on Admission:  Acute renal failure superimposed on stage 3a chronic kidney disease (HCC)  Acute on chronic combined systolic and diastolic CHF (congestive heart failure) (HCC)  Paroxysmal atrial fibrillation (HCC)  Automatic implantable cardioverter-defibrillator in situ  Protein calorie malnutrition (HCC)  CAD (coronary artery disease)  Thrombocytopenia (HCC)  Hypotension  Physical deconditioning  Cholelithiasis   I have reviewed the medical record, interviewed the patient and family, and examined the patient. The following aspects are pertinent.  Past Medical History:  Diagnosis Date   CAD (coronary artery disease)    Nonobstructive cardiac disease by cath   CHF (congestive heart failure) (  HCC)    nonischemic cardiomyopathy   CKD (chronic kidney disease)    History of kidney stones    Hyperlipidemia    Hypertension    Inguinal hernia    PAF  (paroxysmal atrial fibrillation) (HCC)    Patella fracture 01/09/2015   Renal colic 10/07/2011   Ventricular tachycardia (HCC)    s/p ICD   Social History   Socioeconomic History   Marital status: Divorced    Spouse name: Not on file   Number of children: Not on file   Years of education: Not on file   Highest education level: Not on file  Occupational History   Occupation: retired  Tobacco Use   Smoking status: Never   Smokeless tobacco: Never  Vaping Use   Vaping status: Never Used  Substance and Sexual Activity   Alcohol use: No    Alcohol/week: 0.0 standard drinks of alcohol   Drug use: No   Sexual activity: Not on file  Other Topics Concern   Not on file  Social History Narrative   Lives at home. Independent, no assisted devices at baseline   Social Determinants of Health   Financial Resource Strain: Low Risk  (06/03/2022)   Received from [facility], [facility]   Overall Financial Resource Strain (CARDIA)    Difficulty of Paying Living Expenses: Not hard at all  Food Insecurity: No Food Insecurity (06/03/2022)   Received from [facility], [facility]   Hunger Vital Sign    Worried About Running Out of Food in the Last Year: Never true    Ran Out of Food in the Last Year: Never true  Transportation Needs: No Transportation Needs (06/03/2022)   Received from [facility], [facility]   [facility] - Transportation    In the past 12 months, has lack of transportation kept you from medical appointments or from getting medications?: No    Lack of Transportation (Non-Medical): No  Physical Activity: Not on file  Stress: Not on file  Social Connections: Not on file   Family History  Problem Relation Age of Onset   CAD Mother    Kidney cancer Father    Prostate cancer Neg Hx    Bladder Cancer Neg Hx    Scheduled Meds:  furosemide  80 mg Intravenous Q12H    sodium chloride flush  10 mL Intravenous Q12H   Continuous Infusions:  ceFEPime (MAXIPIME) IV     heparin 750 Units/hr (11/18/22 1149)   [START ON 11/19/2022] vancomycin     PRN Meds:.acetaminophen **OR** acetaminophen, ondansetron **OR** ondansetron (ZOFRAN) IV No Known Allergies Review of Systems  Unable to perform ROS: Dementia    Physical Exam Constitutional:      General: He is not in acute distress.    Appearance: Normal appearance.     Comments: Somewhat frail pleasant  Pulmonary:     Effort: Pulmonary effort is normal.     Comments: Nasal cannula in place Skin:    General: Skin is warm and dry.  Neurological:     Mental Status: He is alert. He is disoriented.     Vital Signs: BP 102/63 (BP Location: Right Arm)   Pulse 71   Temp 97.9 F (36.6 C) (Oral)   Resp 16   Ht 5\' 10"  (1.778 m)   Wt 67.6 kg   SpO2 99%   BMI 21.38 kg/m  Pain Scale: 0-10   Pain Score: 0-No pain  SpO2: SpO2: 99 % O2 Device:SpO2: 99 % O2 Flow Rate: .   IO: Intake/output summary:  Intake/Output Summary (Last 24 hours) at 11/18/2022 1604 Last data filed at 11/18/2022 0123 Gross per 24 hour  Intake 400 ml  Output --  Net 400 ml    LBM:   Baseline Weight: Weight: 67.6 kg Most recent weight: Weight: 67.6 kg     Palliative Assessment/Data: PPS 50%     *Please note that this is a verbal dictation therefore any spelling or grammatical errors are due to the "[employer] One" system interpretation.   Time Total: 45 minutes Time spent includes: Detailed review of medical records (labs, imaging, vital signs), medically appropriate exam, discussion with treatment team, counseling and educating patient, family and/or staff, documenting clinical information, medication management and coordination of care.    Gerlean Ren, DNP, AGNP-C Palliative Medicine Team [phone] Pager: [phone]

## 2022-11-18 NOTE — ED Notes (Signed)
Informed Rn Heather via chat/ pt has bed assigned ([id number])

## 2022-11-18 NOTE — ED Notes (Signed)
Assisted patient with urinal. 20 ml urine output

## 2022-11-18 NOTE — ED Notes (Signed)
Patient attempting to get OOB. Bed alarm is set and side rails are up. Patient is confused saying he would like to walk up the street. This nurse explained to patient that this is the emergency room and will be spending the night for observation. Patient made comfortable with warm blanket.

## 2022-11-18 NOTE — Progress Notes (Signed)
*  PRELIMINARY RESULTS* Echocardiogram 2D Echocardiogram has been performed.  Carolyne Fiscal 11/18/2022, 2:55 PM

## 2022-11-18 NOTE — Progress Notes (Signed)
Brief IR Note Request received for IR to place perc chole drain. Upon review by Dr Juliette Alcide, the gallbladder is significantly contracted and in a subdiaphragmatic location which precludes safe placement of a percutaneous cholecystostomy drain. IR will not be proceeding with perc chole placement. Please contact IR team with any questions or concerns.   Kennieth Francois, PA-C 11/18/2022

## 2022-11-18 NOTE — ED Notes (Signed)
Dr Ayiku at bedside 

## 2022-11-18 NOTE — Progress Notes (Signed)
PHARMACY - ANTICOAGULATION CONSULT NOTE  Pharmacy Consult for Heparin  Indication: chest pain/ACS  No Known Allergies  Patient Measurements: Height: 5\' 10"  (177.8 cm) Weight: 46.7 kg (103 lb) IBW/kg (Calculated) : 73 Heparin Dosing Weight: 46.7 kg   Vital Signs: Temp: 98.1 F (36.7 C) (11/08 0900) Temp Source: Oral (11/08 0900) BP: 90/62 (11/08 0900) Pulse Rate: 110 (11/08 0900)  Labs: Recent Labs    11/13/2022 1404 11/11/2022 1718 11/20/2022 2249 11/18/22 0215 11/18/22 0454 11/18/22 1007  HGB 12.0*  --   --   --  12.4*  --   HCT 36.5*  --   --   --  38.8*  --   PLT 75*  --   --   --  70*  --   APTT  --   --   --  40*  --  33  LABPROT  --   --   --  18.3*  --   --   INR  --   --   --  1.5*  --   --   HEPARINUNFRC  --   --   --  0.27*  --   --   CREATININE 1.92*  --   --   --  1.71*  --   CKTOTAL  --  920*  --   --  692*  --   TROPONINIHS  --  476* 356*  --   --   --     Estimated Creatinine Clearance: 19 mL/min (A) (by C-G formula based on SCr of 1.71 mg/dL (H)).   Medical History: Past Medical History:  Diagnosis Date   CAD (coronary artery disease)    Nonobstructive cardiac disease by cath   CHF (congestive heart failure) (HCC)    nonischemic cardiomyopathy   CKD (chronic kidney disease)    History of kidney stones    Hyperlipidemia    Hypertension    Inguinal hernia    PAF (paroxysmal atrial fibrillation) (HCC)    Patella fracture 01/09/2015   Renal colic 10/07/2011   Ventricular tachycardia (HCC)    s/p ICD    Medications:  Eliquis 2.5 mg twice daily Last dose  Assessment: 89 yo M with PMH Afib (on Eliquis), frequent falls, frailty, dementia, HLD, HTN, CHF (EF 20-25%) due to DCM s/p AICD, CKD3 presents with ACS. His ECG exhibits atrial fibrillation and RBBB. Pharmacy consulted to dose heparin. Notably patient takes apixaban 2.5 mg twice daily prior to admission (with recent dispense history) however baseline heparin level does not exhibit false  elevation, so will titrate by antiXa level going forward. Also, patient with low baseline platelet level.  Date Time aPTT/HL  Rate/Comment 11/08 0215 40 s and HL 0.27 Subtherapeutic and correlating 11/10 1007 33 s    Subtherapeutic 11/10 1420 HL 0.31  Irrelevant lab (ordered as add-on to 1007; collected as new draw)       Baseline Labs: aPTT - 40; antiXa - 0.27; INR - 1.5; Hgb - 12.0; Plts - 75   Goal of Therapy:  Heparin level 0.3-0.7 units/ml aPTT 66 -102  seconds Monitor platelets by anticoagulation protocol: Yes   Plan:  Increase heparin to 750 units/hr Check aPTT and antiXa level in 8 hours after rate change Continue to monitor CBC daily while on heparin infusion.  Will M. Dareen Piano, PharmD Clinical Pharmacist 11/18/2022 2:02 PM

## 2022-11-18 NOTE — Assessment & Plan Note (Signed)
Chest CT showing right middle and lower lobe collapse and consolidation Continue vancomycin and cefepime Will keep n.p.o. Aspiration precautions Speech therapy evaluation Incentive spirometer

## 2022-11-18 NOTE — ED Notes (Signed)
Pt. Having frequent wide complex QRS, pt. Is vent. Paced. EKG captured.  Dr. Myriam Forehand notified. MD also notified pt. Having swelling and bruising to left arm. Overnight RN states left arm was swollen previously, but bruising is new.

## 2022-11-18 NOTE — Assessment & Plan Note (Addendum)
Suspect congestive hepatopathy/shock liver related to low output cardiac failure AST 1751, ALT 1049, alk phos 134, total bili 2.6 Patient denies abdominal pain but endorses nausea CT abdomen and pelvis showing cholelithiasis Will get right upper quadrant ultrasound Continue to trend LFTs

## 2022-11-18 NOTE — Assessment & Plan Note (Signed)
CAD Suspect type II NSTEMI given downward trend in troponin from 476-->356 Chest CT showed extensive multivessel coronary artery calcification Started on heparin infusion without bolus due to low platelets and low suspicion for type I NSTEMI Cardiology consult for continuing recommendations Continue simvastatin.  Holding metoprolol due to soft BP

## 2022-11-18 NOTE — H&P (Addendum)
History and Physical    Patient: Spencer Coleman DOB: Oct 05, 1930 DOA: 11/28/2022 DOS: the patient was seen and examined on 11/18/2022 PCP: Barbette Reichmann, MD  Patient coming from: Home  Chief Complaint:  Chief Complaint  Patient presents with   Fall    HPI: Spencer Coleman is a 89 y.o. male with medical history significant for Paroxysmal Atrial Fibrillation, , HLD, HTN, CHF secondary to dilated cardiomyopathy s/p AICD (EF 20 to 25% 05/2022), CKD 3A, frequent falls and frailty, dementia, who lives independently, ambulant with walker, with a sister checking on him daily, who presents to the ED due to multiple falls.  Patient fell the night prior and was unable to get up spending all night on the floor.  EMS assisted him up the morning but he fell again in the afternoon and was brought in by EMS for evaluation.  History was provided to the ED provider and by sister who is unavailable at the time of my evaluation of the patient.  Patient denies having fallen and states that he came to the hospital because he was feeling sick to his stomach.  Denies chest pain or shortness of breath and states he feels great.  He does have a history of dementia so his account may be unreliable.  Patient was seen in the heart failure clinic the day prior to his falls and was found to be stable. ED course and Data review: On arrival BP 99/51 with mild tachypnea to the low 20s and otherwise normal vitals. Labs: Troponin 476--356, with BNP> 4500 WBC 8900 with lactic acid 1.8.  Hemoglobin 12 VBG with normal pH and pCO2 CK 920, ammonia 11, UA unremarkable BMP with creatinine 1.92 up from baseline of 1.4 a month ago Markedly abnormal hepatic function panel as follows: AST 1751, ALT 1049, alk phos 134, total bili 2.6 EKG, personally viewed and interpreted with sinus at 67 with VPCs and RBBB Imaging: CT head and C-spine nonacute/nontraumatic CT abdomen and pelvis with contrast as well as CTA chest showing right  middle and lower lobe collapse and consolidation, anasarca, cholelithiasis as further detailed below: IMPRESSION: 1. No pulmonary embolism. 2. Right-sided volume loss with elevation of the right hemidiaphragm and right middle and lower lobar collapse and consolidation. Superimposed extensive airway impaction within the right lower lobe and, to a lesser extent, right middle lobe and peribronchial nodularity which may relate to superimposed changes aspiration or acute infection. Trace right pleural effusion. 3. Mild to moderate global cardiomegaly with left ventricular dilation. Extensive multi-vessel coronary artery calcification. Morphologic changes in keeping with pulmonary arterial hypertension. 4. Cholelithiasis. 5. 15 mm dependently layering calculus within the bladder. 6. Mild anasarca.  Patient treated with cefepime and vancomycin for pneumonia and given an NS fluid bolus Hospitalist consulted for admission.     Past Medical History:  Diagnosis Date   CAD (coronary artery disease)    Nonobstructive cardiac disease by cath   CHF (congestive heart failure) (HCC)    nonischemic cardiomyopathy   CKD (chronic kidney disease)    History of kidney stones    Hyperlipidemia    Hypertension    Inguinal hernia    PAF (paroxysmal atrial fibrillation) (HCC)    Patella fracture 01/09/2015   Renal colic 10/07/2011   Ventricular tachycardia [facility])    s/p ICD   Past Surgical History:  Procedure Laterality Date   CARDIAC CATHETERIZATION     CARDIAC DEFIBRILLATOR PLACEMENT  2009   CYSTOSCOPY WITH LITHOLAPAXY N/A 02/21/2018  Procedure: CYSTOSCOPY WITH LITHOLAPAXY;  Surgeon: Sondra Come, MD;  Location: [facility] ORS;  Service: Urology;  Laterality: N/A;   ELBOW SURGERY Left    HOLMIUM LASER APPLICATION N/A 02/21/2018   Procedure: HOLMIUM LASER APPLICATION;  Surgeon: Sondra Come, MD;  Location: [facility] ORS;  Service: Urology;  Laterality: N/A;   ICD GENERATOR CHANGEOUT N/A 01/28/2021    Procedure: ICD GENERATOR CHANGEOUT;  Surgeon: Marcina Millard, MD;  Location: [facility] INVASIVE CV LAB;  Service: Cardiovascular;  Laterality: N/A;   INGUINAL HERNIA REPAIR     LITHOTRIPSY     ORIF left wrist fracture Left    ORIF PATELLA Left 01/10/2015   Procedure: OPEN REDUCTION INTERNAL (ORIF) FIXATION PATELLA;  Surgeon: Donato Heinz, MD;  Location: [facility] ORS;  Service: Orthopedics;  Laterality: Left;   ORIF PATELLA Left 02/06/2015   Procedure: OPEN REDUCTION INTERNAL (ORIF) FIXATION PATELLA;  Surgeon: Donato Heinz, MD;  Location: [facility] ORS;  Service: Orthopedics;  Laterality: Left;   TRANSURETHRAL RESECTION OF PROSTATE N/A 02/21/2018   Procedure: TRANSURETHRAL RESECTION OF THE PROSTATE (TURP);  Surgeon: Sondra Come, MD;  Location: [facility] ORS;  Service: Urology;  Laterality: N/A;   Social History:  reports that he has never smoked. He has never used smokeless tobacco. He reports that he does not drink alcohol and does not use drugs.  No Known Allergies  Family History  Problem Relation Age of Onset   CAD Mother    Kidney cancer Father    Prostate cancer Neg Hx    Bladder Cancer Neg Hx     Prior to Admission medications   Medication Sig Start Date End Date Taking? Authorizing Provider  amiodarone (PACERONE) 200 MG tablet Take 1 tablet (200 mg total) by mouth daily. 06/01/22   Charise Killian, MD  apixaban (ELIQUIS) 2.5 MG TABS tablet Take 1 tablet (2.5 mg total) by mouth 2 (two) times daily. 06/14/21   Wouk, Wilfred Curtis, MD  cyanocobalamin (VITAMIN B12) 250 MCG tablet Take 250 mcg by mouth daily.    [provider]  furosemide (LASIX) 40 MG tablet Take 1 tablet (40 mg total) by mouth daily. 10/19/22 01/17/23  Delma Freeze, FNP  metoprolol succinate (TOPROL-XL) 25 MG 24 hr tablet Take 0.5 tablets (12.5 mg total) by mouth daily. 06/01/22   Charise Killian, MD  potassium chloride (KLOR-CON) 10 MEQ tablet Take 1 tablet (10 mEq total) by mouth daily. 09/27/22 12/26/22   Delma Freeze, FNP  simvastatin (ZOCOR) 40 MG tablet Take 40 mg by mouth at bedtime. 11/09/20   [provider]  Vitamin D, Ergocalciferol, (DRISDOL) 1.25 MG (50000 UNIT) CAPS capsule Take 50,000 Units by mouth once a week. 03/20/22   [provider]    Physical Exam: Vitals:   11/16/2022 1900 12/01/2022 1930 11/11/2022 2000 11/12/2022 2026  BP: 115/66 117/71 113/67   Pulse: 77 69 70   Resp: (!) 22 (!) 21 (!) 24   Temp:    98.9 F (37.2 C)  TempSrc:    Oral  SpO2: 97% 96% 96%   Weight:      Height:       Physical Exam Vitals and nursing note reviewed.  Constitutional:      General: He is not in acute distress. HENT:     Head: Normocephalic and atraumatic.  Cardiovascular:     Rate and Rhythm: Normal rate and regular rhythm.     Heart sounds: Normal heart sounds.  Pulmonary:  Effort: Tachypnea present.     Breath sounds: Normal breath sounds.  Abdominal:     Palpations: Abdomen is soft.     Tenderness: There is no abdominal tenderness.  Musculoskeletal:     Right lower leg: Edema present.     Left lower leg: Edema present.  Neurological:     Mental Status: Mental status is at baseline.     Labs on Admission: I have personally reviewed following labs and imaging studies  CBC: Recent Labs  Lab 12/08/2022 1404  WBC 8.9  NEUTROABS 7.8*  HGB 12.0*  HCT 36.5*  MCV 94.1  PLT 75*   Basic Metabolic Panel: Recent Labs  Lab 11/18/2022 1404  NA 133*  K 4.4  CL 104  CO2 20*  GLUCOSE 96  BUN 37*  CREATININE 1.92*  CALCIUM 8.2*   GFR: Estimated Creatinine Clearance: 16.9 mL/min (A) (by C-G formula based on SCr of 1.92 mg/dL (H)). Liver Function Tests: Recent Labs  Lab 11/24/2022 1718  AST 1,751*  ALT 1,049*  ALKPHOS 134*  BILITOT 2.6*  PROT 5.9*  ALBUMIN 3.4*   No results for input(s): "LIPASE", "AMYLASE" in the last 168 hours. Recent Labs  Lab 11/30/2022 1844  AMMONIA 11   Coagulation Profile: No results for input(s): "INR", "PROTIME" in  the last 168 hours. Cardiac Enzymes: Recent Labs  Lab 11/29/2022 1718  CKTOTAL 920*   BNP (last 3 results) No results for input(s): "PROBNP" in the last 8760 hours. HbA1C: No results for input(s): "HGBA1C" in the last 72 hours. CBG: No results for input(s): "GLUCAP" in the last 168 hours. Lipid Profile: No results for input(s): "CHOL", "HDL", "LDLCALC", "TRIG", "CHOLHDL", "LDLDIRECT" in the last 72 hours. Thyroid Function Tests: No results for input(s): "TSH", "T4TOTAL", "FREET4", "T3FREE", "THYROIDAB" in the last 72 hours. Anemia Panel: No results for input(s): "VITAMINB12", "FOLATE", "FERRITIN", "TIBC", "IRON", "RETICCTPCT" in the last 72 hours. Urine analysis:    Component Value Date/Time   COLORURINE AMBER (A) 11/26/2022 1717   APPEARANCEUR CLOUDY (A) 11/13/2022 1717   APPEARANCEUR Clear 02/01/2018 1111   LABSPEC 1.041 (H) 11/16/2022 1717   LABSPEC 1.009 04/09/2013 1315   PHURINE 5.0 12/02/2022 1717   GLUCOSEU NEGATIVE 12/01/2022 1717   GLUCOSEU Negative 04/09/2013 1315   HGBUR LARGE (A) 12/07/2022 1717   BILIRUBINUR NEGATIVE 11/30/2022 1717   BILIRUBINUR Negative 02/01/2018 1111   BILIRUBINUR Negative 04/09/2013 1315   KETONESUR NEGATIVE 12/06/2022 1717   PROTEINUR 100 (A) 11/23/2022 1717   NITRITE NEGATIVE 11/16/2022 1717   LEUKOCYTESUR NEGATIVE 11/20/2022 1717   LEUKOCYTESUR Negative 04/09/2013 1315    Radiological Exams on Admission: CT Angio Chest Pulmonary Embolism (PE) W or WO Contrast  Result Date: 11/23/2022 CLINICAL DATA:  Pulmonary embolism (PE) suspected, high prob RLE swelling, elevated trop, elevated LFTs, AMS, falls; Abdominal pain, acute, nonlocalized elevated LFTs and t bili EXAM: CT ANGIOGRAPHY CHEST CT ABDOMEN AND PELVIS WITH CONTRAST TECHNIQUE: Multidetector CT imaging of the chest was performed using the standard protocol during bolus administration of intravenous contrast. Multiplanar CT image reconstructions and MIPs were obtained to evaluate the  vascular anatomy. Multidetector CT imaging of the abdomen and pelvis was performed using the standard protocol during bolus administration of intravenous contrast. RADIATION DOSE REDUCTION: This exam was performed according to the departmental dose-optimization program which includes automated exposure control, adjustment of the mA and/or kV according to patient size and/or use of iterative reconstruction technique. CONTRAST:  OMNIPAQUE IOHEXOL 350 MG/ML SOLN COMPARISON:  06/12/2021 FINDINGS: CTA CHEST FINDINGS  Cardiovascular: Apparent filling defects within the right middle and lower lobar pulmonary arteries are artifactual and related to pulmonary vascular redistribution of setting of consolidation. Delayed imaging on CT examination of the abdomen pelvis demonstrates patency of these structures and no intraluminal filling defect identified to suggest pulmonary embolism. The central pulmonary arteries, however, are enlarged in keeping with changes of pulmonary arterial hypertension. Extensive multi-vessel coronary artery calcification. Extensive calcification of the aortic valve leaflets. Mild to moderate global cardiomegaly with left ventricular dilation. Left subclavian pacemaker in place with leads within the right atrium, right ventricle, and left ventricular venous outflow. No pericardial effusion. Moderate atherosclerotic calcification within the thoracic aorta. No aortic aneurysm. Mediastinum/Nodes: Visualized thyroid is unremarkable. No pathologic thoracic adenopathy. Esophagus unremarkable. Lungs/Pleura: There is right-sided volume loss with elevation of the right hemidiaphragm and right middle and lower lobar collapse and consolidation. Superimposed extensive airway impaction within the right lower lobe and, to a lesser extent, right middle lobe and peribronchial nodularity is seen which may relate to superimposed changes aspiration or acute infection. Trace right pleural effusion. No pneumothorax.  Musculoskeletal: No acute bone abnormality. No lytic or blastic bone lesion. Review of the MIP images confirms the above findings. CT ABDOMEN and PELVIS FINDINGS Hepatobiliary: Cholelithiasis without pericholecystic inflammatory changes identified. Liver unremarkable. No intra or extrahepatic biliary ductal dilation. Pancreas: Unremarkable Spleen: Unremarkable Adrenals/Urinary Tract: The adrenal glands are unremarkable. The kidneys are mildly atrophic. Multiple simple cortical and parapelvic cysts are seen within the kidneys bilaterally for which no follow-up imaging is recommended. The kidneys are otherwise unremarkable. The bladder contains a 15 mm dependently layering calculus. The bladder is not distended. Stomach/Bowel: The stomach, small bowel, and large bowel are unremarkable. The appendix is absent. No free intraperitoneal gas or fluid. Vascular/Lymphatic: Extensive aortoiliac atherosclerotic calcification. Extensive visceral arteriosclerosis. No aortic aneurysm. No pathologic adenopathy within the abdomen and pelvis. Reproductive: Prostate is unremarkable. Other: No abdominal wall hernia. Diffuse subcutaneous body wall edema is present in keeping with changes of mild anasarca. Musculoskeletal: Osseous structures are age-appropriate. Diffuse osteopenia. No acute bone abnormality. No lytic or blastic bone lesion. Review of the MIP images confirms the above findings. IMPRESSION: 1. No pulmonary embolism. 2. Right-sided volume loss with elevation of the right hemidiaphragm and right middle and lower lobar collapse and consolidation. Superimposed extensive airway impaction within the right lower lobe and, to a lesser extent, right middle lobe and peribronchial nodularity which may relate to superimposed changes aspiration or acute infection. Trace right pleural effusion. 3. Mild to moderate global cardiomegaly with left ventricular dilation. Extensive multi-vessel coronary artery calcification. Morphologic  changes in keeping with pulmonary arterial hypertension. 4. Cholelithiasis. 5. 15 mm dependently layering calculus within the bladder. 6. Mild anasarca. Aortic Atherosclerosis (ICD10-I70.0). Electronically Signed   By: Helyn Numbers M.D.   On: 12/08/2022 22:15   CT ABDOMEN PELVIS W CONTRAST  Result Date: 11/13/2022 CLINICAL DATA:  Pulmonary embolism (PE) suspected, high prob RLE swelling, elevated trop, elevated LFTs, AMS, falls; Abdominal pain, acute, nonlocalized elevated LFTs and t bili EXAM: CT ANGIOGRAPHY CHEST CT ABDOMEN AND PELVIS WITH CONTRAST TECHNIQUE: Multidetector CT imaging of the chest was performed using the standard protocol during bolus administration of intravenous contrast. Multiplanar CT image reconstructions and MIPs were obtained to evaluate the vascular anatomy. Multidetector CT imaging of the abdomen and pelvis was performed using the standard protocol during bolus administration of intravenous contrast. RADIATION DOSE REDUCTION: This exam was performed according to the departmental dose-optimization program which includes automated exposure control,  adjustment of the mA and/or kV according to patient size and/or use of iterative reconstruction technique. CONTRAST:  OMNIPAQUE IOHEXOL 350 MG/ML SOLN COMPARISON:  06/12/2021 FINDINGS: CTA CHEST FINDINGS Cardiovascular: Apparent filling defects within the right middle and lower lobar pulmonary arteries are artifactual and related to pulmonary vascular redistribution of setting of consolidation. Delayed imaging on CT examination of the abdomen pelvis demonstrates patency of these structures and no intraluminal filling defect identified to suggest pulmonary embolism. The central pulmonary arteries, however, are enlarged in keeping with changes of pulmonary arterial hypertension. Extensive multi-vessel coronary artery calcification. Extensive calcification of the aortic valve leaflets. Mild to moderate global cardiomegaly with left  ventricular dilation. Left subclavian pacemaker in place with leads within the right atrium, right ventricle, and left ventricular venous outflow. No pericardial effusion. Moderate atherosclerotic calcification within the thoracic aorta. No aortic aneurysm. Mediastinum/Nodes: Visualized thyroid is unremarkable. No pathologic thoracic adenopathy. Esophagus unremarkable. Lungs/Pleura: There is right-sided volume loss with elevation of the right hemidiaphragm and right middle and lower lobar collapse and consolidation. Superimposed extensive airway impaction within the right lower lobe and, to a lesser extent, right middle lobe and peribronchial nodularity is seen which may relate to superimposed changes aspiration or acute infection. Trace right pleural effusion. No pneumothorax. Musculoskeletal: No acute bone abnormality. No lytic or blastic bone lesion. Review of the MIP images confirms the above findings. CT ABDOMEN and PELVIS FINDINGS Hepatobiliary: Cholelithiasis without pericholecystic inflammatory changes identified. Liver unremarkable. No intra or extrahepatic biliary ductal dilation. Pancreas: Unremarkable Spleen: Unremarkable Adrenals/Urinary Tract: The adrenal glands are unremarkable. The kidneys are mildly atrophic. Multiple simple cortical and parapelvic cysts are seen within the kidneys bilaterally for which no follow-up imaging is recommended. The kidneys are otherwise unremarkable. The bladder contains a 15 mm dependently layering calculus. The bladder is not distended. Stomach/Bowel: The stomach, small bowel, and large bowel are unremarkable. The appendix is absent. No free intraperitoneal gas or fluid. Vascular/Lymphatic: Extensive aortoiliac atherosclerotic calcification. Extensive visceral arteriosclerosis. No aortic aneurysm. No pathologic adenopathy within the abdomen and pelvis. Reproductive: Prostate is unremarkable. Other: No abdominal wall hernia. Diffuse subcutaneous body wall edema is  present in keeping with changes of mild anasarca. Musculoskeletal: Osseous structures are age-appropriate. Diffuse osteopenia. No acute bone abnormality. No lytic or blastic bone lesion. Review of the MIP images confirms the above findings. IMPRESSION: 1. No pulmonary embolism. 2. Right-sided volume loss with elevation of the right hemidiaphragm and right middle and lower lobar collapse and consolidation. Superimposed extensive airway impaction within the right lower lobe and, to a lesser extent, right middle lobe and peribronchial nodularity which may relate to superimposed changes aspiration or acute infection. Trace right pleural effusion. 3. Mild to moderate global cardiomegaly with left ventricular dilation. Extensive multi-vessel coronary artery calcification. Morphologic changes in keeping with pulmonary arterial hypertension. 4. Cholelithiasis. 5. 15 mm dependently layering calculus within the bladder. 6. Mild anasarca. Aortic Atherosclerosis (ICD10-I70.0). Electronically Signed   By: Helyn Numbers M.D.   On: 12/07/2022 22:15   CT Head Wo Contrast  Result Date: 11/22/2022 CLINICAL DATA:  Multiple falls EXAM: CT HEAD WITHOUT CONTRAST CT CERVICAL SPINE WITHOUT CONTRAST TECHNIQUE: Multidetector CT imaging of the head and cervical spine was performed following the standard protocol without intravenous contrast. Multiplanar CT image reconstructions of the cervical spine were also generated. RADIATION DOSE REDUCTION: This exam was performed according to the departmental dose-optimization program which includes automated exposure control, adjustment of the mA and/or kV according to patient size and/or use of  iterative reconstruction technique. COMPARISON:  05/16/2022 CT head, no prior CT cervical spine. FINDINGS: CT HEAD FINDINGS Brain: No evidence of acute infarct, hemorrhage, mass, mass effect, or midline shift. No hydrocephalus or extra-axial fluid collection. Age related cerebral atrophy. Vascular: No  hyperdense vessel. Atherosclerotic calcifications in the intracranial carotid and vertebral arteries. Skull: Negative for fracture or focal lesion. Sinuses/Orbits: No acute finding. Chronic left lamina papyracea fracture. Other: The mastoid air cells are well aerated. CT CERVICAL SPINE FINDINGS Alignment: No traumatic listhesis. Trace anterolisthesis of to C5 on C6 and C7 on T1, which appears facet mediated. Dextrocurvature of the cervical spine may be positional. Skull base and vertebrae: No acute fracture or suspicious osseous lesion. Soft tissues and spinal canal: No prevertebral fluid or swelling. No visible canal hematoma. Disc levels: Degenerative changes in the cervical spine.No high-grade spinal canal stenosis. Upper chest: No focal pulmonary opacity or pleural effusion. IMPRESSION: 1. No acute intracranial process. 2. No acute fracture or traumatic listhesis in the cervical spine. Electronically Signed   By: Wiliam Ke M.D.   On: 12/07/2022 19:51   CT CERVICAL SPINE WO CONTRAST  Result Date: 12/06/2022 CLINICAL DATA:  Multiple falls EXAM: CT HEAD WITHOUT CONTRAST CT CERVICAL SPINE WITHOUT CONTRAST TECHNIQUE: Multidetector CT imaging of the head and cervical spine was performed following the standard protocol without intravenous contrast. Multiplanar CT image reconstructions of the cervical spine were also generated. RADIATION DOSE REDUCTION: This exam was performed according to the departmental dose-optimization program which includes automated exposure control, adjustment of the mA and/or kV according to patient size and/or use of iterative reconstruction technique. COMPARISON:  05/16/2022 CT head, no prior CT cervical spine. FINDINGS: CT HEAD FINDINGS Brain: No evidence of acute infarct, hemorrhage, mass, mass effect, or midline shift. No hydrocephalus or extra-axial fluid collection. Age related cerebral atrophy. Vascular: No hyperdense vessel. Atherosclerotic calcifications in the intracranial  carotid and vertebral arteries. Skull: Negative for fracture or focal lesion. Sinuses/Orbits: No acute finding. Chronic left lamina papyracea fracture. Other: The mastoid air cells are well aerated. CT CERVICAL SPINE FINDINGS Alignment: No traumatic listhesis. Trace anterolisthesis of to C5 on C6 and C7 on T1, which appears facet mediated. Dextrocurvature of the cervical spine may be positional. Skull base and vertebrae: No acute fracture or suspicious osseous lesion. Soft tissues and spinal canal: No prevertebral fluid or swelling. No visible canal hematoma. Disc levels: Degenerative changes in the cervical spine.No high-grade spinal canal stenosis. Upper chest: No focal pulmonary opacity or pleural effusion. IMPRESSION: 1. No acute intracranial process. 2. No acute fracture or traumatic listhesis in the cervical spine. Electronically Signed   By: Wiliam Ke M.D.   On: 11/24/2022 19:51     Data Reviewed: Relevant notes from primary care and specialist visits, past discharge summaries as available in EHR, including Care Everywhere. Prior diagnostic testing as pertinent to current admission diagnoses Updated medications and problem lists for reconciliation ED course, including vitals, labs, imaging, treatment and response to treatment Triage notes, nursing and pharmacy notes and ED provider's notes Notable results as noted in HPI   Assessment and Plan: Acute on chronic systolic CHF (congestive heart failure) (HCC) Cardiomyopathy, EF 20 to 25% 05/2022 Patient clinically fluid overloaded, BNP over 4500, chest CT showing global cardiomegaly, mild anasarca.  LFT elevation Patient with soft blood pressures so unable to tolerate IV Lasix without pressors Add low-dose Lasix and midodrine if needed for BP support-may need milrinone Daily weights with intake and output monitoring Will get updated echo Cardiology  consult Close monitoring with low threshold for transfer to stepdown/ICU due to low output  heart failure/?cardiogenic shock  Abnormal LFTs Suspect congestive hepatopathy/shock liver related to low output cardiac failure AST 1751, ALT 1049, alk phos 134, total bili 2.6 Patient denies abdominal pain but endorses nausea CT abdomen and pelvis showing cholelithiasis Will get right upper quadrant ultrasound Continue to trend LFTs  Aspiration pneumonia (HCC) Chest CT showing right middle and lower lobe collapse and consolidation Continue vancomycin and cefepime Will keep n.p.o. Aspiration precautions Speech therapy evaluation Incentive spirometer  NSTEMI (non-ST elevated myocardial infarction) (HCC) CAD Suspect type II NSTEMI given downward trend in troponin from 476-->356 Chest CT showed extensive multivessel coronary artery calcification Started on heparin infusion without bolus due to low platelets and low suspicion for type I NSTEMI Cardiology consult for continuing recommendations Continue simvastatin.  Holding metoprolol due to soft BP  Rhabdomyolysis Frequent falls/frailty/physical deconditioning Patient lives independently and ambulates with walker PT and TOC consults  Paroxysmal atrial fibrillation (HCC) History of ventricular tachycardia s/p AICD Chronic anticoagulation Continue amiodarone.  Holding Eliquis due to heparin infusion  continue metoprolol if BP allows  Acute renal failure superimposed on stage 3a chronic kidney disease (HCC) Suspect due to ATN from chronic low volume state due to poor cardiac output Continue to monitor  Thrombocytopenia (HCC) Platelets 75,000.  Was normal a month prior, previously 126,000 On heparin but with monitoring for bleeding Holding Eliquis due to heparin    DVT prophylaxis: hepatin  Consults: cardiology  Advance Care Planning: Might need SNF  Family Communication: none  Disposition Plan: Back to previous home environment  Severity of Illness: The appropriate patient status for this patient is INPATIENT.  Inpatient status is judged to be reasonable and necessary in order to provide the required intensity of service to ensure the patient's safety. The patient's presenting symptoms, physical exam findings, and initial radiographic and laboratory data in the context of their chronic comorbidities is felt to place them at high risk for further clinical deterioration. Furthermore, it is not anticipated that the patient will be medically stable for discharge from the hospital within 2 midnights of admission.   * I certify that at the point of admission it is my clinical judgment that the patient will require inpatient hospital care spanning beyond 2 midnights from the point of admission due to high intensity of service, high risk for further deterioration and high frequency of surveillance required.* CRITICAL CARE Performed by: Andris Baumann   Total critical care time: 63 minutes  Critical care time was exclusive of separately billable procedures and treating other patients.  Critical care was necessary to treat or prevent imminent or life-threatening deterioration.  Critical care was time spent personally by me on the following activities: development of treatment plan with patient and/or surrogate as well as nursing, discussions with consultants, evaluation of patient's response to treatment, examination of patient, obtaining history from patient or surrogate, ordering and performing treatments and interventions, ordering and review of laboratory studies, ordering and review of radiographic studies, pulse oximetry and re-evaluation of patient's condition.  Author: Andris Baumann, MD 11/18/2022 12:44 AM  For on call review [email or web address].

## 2022-11-18 NOTE — ED Notes (Signed)
Patient is resting with eyes closed. Vitals WNL. RR are even and unlabored. Continuous monitoring in place

## 2022-11-18 NOTE — Consult Note (Signed)
Date of Consultation:  11/18/2022  Requesting Physician:  Lurene Shadow, MD  Reason for Consultation:  Elevated LFTs and possible cholecystitis  History of Present Illness: Spencer Coleman is a 89 y.o. male admitted to the medical team yesterday after presenting with multiple falls at home and dizziness.  On workup, yesterday he labs which showed a very elevated BNP >4500, troponin of 476, CK 920, with elevated LFTS with tbili 2.6 (direct 0.8), AST 1751, ALT 1049, alk phos 134.  He had a CT head/cspine which was negative, CTA chest which was negative for PE but a possible aspiration pneumonia, cardiomegaly, cholelithiasis, and extensive CAD.  His LFTs remained elevated today and he had a RUQ U/S which showed gallbladder wall thickening and pericholecystic fluid, suspicious for cholecystitis.    He does have a significant cardiac history, with non-ischemic cardiomyopathy, with EF 20-25%, atrial fibrillation, HTN, CAD, s/p ICD placement.   On my visit with the patient this afternoon, he reports he's feeling well and denies any abdominal pain, nausea, or vomiting.  Past Medical History: Past Medical History:  Diagnosis Date   CAD (coronary artery disease)    Nonobstructive cardiac disease by cath   CHF (congestive heart failure) (HCC)    nonischemic cardiomyopathy   CKD (chronic kidney disease)    History of kidney stones    Hyperlipidemia    Hypertension    Inguinal hernia    PAF (paroxysmal atrial fibrillation) (HCC)    Patella fracture 01/09/2015   Renal colic 10/07/2011   Ventricular tachycardia [facility])    s/p ICD     Past Surgical History: Past Surgical History:  Procedure Laterality Date   CARDIAC CATHETERIZATION     CARDIAC DEFIBRILLATOR PLACEMENT  2009   CYSTOSCOPY WITH LITHOLAPAXY N/A 02/21/2018   Procedure: CYSTOSCOPY WITH LITHOLAPAXY;  Surgeon: Sondra Come, MD;  Location: [facility] ORS;  Service: Urology;  Laterality: N/A;   ELBOW SURGERY Left    HOLMIUM LASER APPLICATION  N/A 02/21/2018   Procedure: HOLMIUM LASER APPLICATION;  Surgeon: Sondra Come, MD;  Location: [facility] ORS;  Service: Urology;  Laterality: N/A;   ICD GENERATOR CHANGEOUT N/A 01/28/2021   Procedure: ICD GENERATOR CHANGEOUT;  Surgeon: Marcina Millard, MD;  Location: [facility] INVASIVE CV LAB;  Service: Cardiovascular;  Laterality: N/A;   INGUINAL HERNIA REPAIR     LITHOTRIPSY     ORIF left wrist fracture Left    ORIF PATELLA Left 01/10/2015   Procedure: OPEN REDUCTION INTERNAL (ORIF) FIXATION PATELLA;  Surgeon: Donato Heinz, MD;  Location: [facility] ORS;  Service: Orthopedics;  Laterality: Left;   ORIF PATELLA Left 02/06/2015   Procedure: OPEN REDUCTION INTERNAL (ORIF) FIXATION PATELLA;  Surgeon: Donato Heinz, MD;  Location: [facility] ORS;  Service: Orthopedics;  Laterality: Left;   TRANSURETHRAL RESECTION OF PROSTATE N/A 02/21/2018   Procedure: TRANSURETHRAL RESECTION OF THE PROSTATE (TURP);  Surgeon: Sondra Come, MD;  Location: [facility] ORS;  Service: Urology;  Laterality: N/A;    Home Medications: Prior to Admission medications   Medication Sig Start Date End Date Taking? Authorizing Provider  amiodarone (PACERONE) 400 MG tablet Take 400 mg by mouth daily.   Yes [provider]  apixaban (ELIQUIS) 2.5 MG TABS tablet Take 1 tablet (2.5 mg total) by mouth 2 (two) times daily. 06/14/21  Yes Wouk, Wilfred Curtis, MD  cyanocobalamin (VITAMIN B12) 250 MCG tablet Take 250 mcg by mouth daily.   Yes [provider]  furosemide (LASIX) 40 MG tablet Take 1 tablet (40 mg  total) by mouth daily. 10/19/22 01/17/23 Yes Clarisa Kindred A, FNP  metoprolol succinate (TOPROL-XL) 25 MG 24 hr tablet Take 0.5 tablets (12.5 mg total) by mouth daily. 06/01/22  Yes Charise Killian, MD  potassium chloride (KLOR-CON) 10 MEQ tablet Take 1 tablet (10 mEq total) by mouth daily. 09/27/22 12/26/22 Yes Hackney, Inetta Fermo A, FNP  simvastatin (ZOCOR) 20 MG tablet Take 20 mg by mouth daily at 6 PM.   Yes [provider]   Vitamin D, Ergocalciferol, (DRISDOL) 1.25 MG (50000 UNIT) CAPS capsule Take 50,000 Units by mouth once a week. 03/20/22  Yes [provider]  amiodarone (PACERONE) 200 MG tablet Take 1 tablet (200 mg total) by mouth daily. Patient not taking: Reported on 11/18/2022 06/01/22   Charise Killian, MD    Allergies: No Known Allergies  Social History:  reports that he has never smoked. He has never used smokeless tobacco. He reports that he does not drink alcohol and does not use drugs.   Family History: Family History  Problem Relation Age of Onset   CAD Mother    Kidney cancer Father    Prostate cancer Neg Hx    Bladder Cancer Neg Hx     Review of Systems: Review of Systems  Constitutional:  Negative for chills and fever.  Respiratory:  Negative for shortness of breath.   Cardiovascular:  Negative for chest pain.  Gastrointestinal:  Negative for abdominal pain, nausea and vomiting.  Musculoskeletal:  Positive for falls.  Skin:  Negative for rash.  Neurological:  Positive for dizziness (with resulting falls).  Psychiatric/Behavioral:  Negative for depression.     Physical Exam BP 102/63 (BP Location: Right Arm)   Pulse 71   Temp 97.9 F (36.6 C) (Oral)   Resp 16   Ht 5\' 10"  (1.778 m)   Wt 67.6 kg   SpO2 99%   BMI 21.38 kg/m  CONSTITUTIONAL: No acute distress HEENT:  Normocephalic, atraumatic, extraocular motion intact. RESPIRATORY:  Normal respiratory effort without pathologic use of accessory muscles. CARDIOVASCULAR: Irregular rhythm, regular rate. GI: The abdomen is soft, non-distended, non-tender.  Negative Murphy's sign.  MUSCULOSKELETAL:  Bilateral lower extremity edema.  Bilateral upper extremity skin changes from ecchymosis. NEUROLOGIC:  Motor and sensation is grossly normal.  Cranial nerves are grossly intact. PSYCH:  Alert and oriented to person, place and time. Affect is normal.  Laboratory Analysis: Results for orders placed or performed during  the hospital encounter of 12/07/2022 (from the past 24 hour(s))  Ammonia     Status: None   Collection Time: 12/09/2022  6:44 PM  Result Value Ref Range   Ammonia 11 9 - 35 umol/L  Blood gas, venous     Status: Abnormal   Collection Time: 11/16/2022  8:47 PM  Result Value Ref Range   pH, Ven 7.32 7.25 - 7.43   pCO2, Ven 56 44 - 60 mmHg   pO2, Ven 34 32 - 45 mmHg   Bicarbonate 28.9 (H) 20.0 - 28.0 mmol/L   Acid-Base Excess 1.6 0.0 - 2.0 mmol/L   O2 Saturation 25.1 %   Patient temperature 37.0    Collection site VEIN   Troponin I (High Sensitivity)     Status: Abnormal   Collection Time: 11/16/2022 10:49 PM  Result Value Ref Range   Troponin I (High Sensitivity) 356 (HH) <18 ng/L  Blood culture (routine x 2)     Status: None (Preliminary result)   Collection Time: 11/20/2022 10:49 PM   Specimen: BLOOD  Result Value Ref Range   Specimen Description BLOOD RIGHT FOREARM    Special Requests      BOTTLES DRAWN AEROBIC AND ANAEROBIC Blood Culture adequate volume   Culture      NO GROWTH < 12 HOURS Performed at [facility], [address] [place], [place] [address]    Report Status PENDING   Blood culture (routine x 2)     Status: None (Preliminary result)   Collection Time: 11/15/2022 10:49 PM   Specimen: BLOOD  Result Value Ref Range   Specimen Description BLOOD LEFT FOREARM    Special Requests      BOTTLES DRAWN AEROBIC AND ANAEROBIC Blood Culture results may not be optimal due to an inadequate volume of blood received in culture bottles   Culture      NO GROWTH < 12 HOURS Performed at [facility], [address] [place], [place] [address]    Report Status PENDING   Lactic acid, plasma     Status: None   Collection Time: 11/13/2022 10:49 PM  Result Value Ref Range   Lactic Acid, Venous 1.8 0.5 - 1.9 mmol/L  APTT     Status: Abnormal   Collection Time: 11/18/22  2:15 AM  Result Value Ref Range   aPTT 40 (H) 24 - 36 seconds  Heparin level (unfractionated)      Status: Abnormal   Collection Time: 11/18/22  2:15 AM  Result Value Ref Range   Heparin Unfractionated 0.27 (L) 0.30 - 0.70 IU/mL  Protime-INR     Status: Abnormal   Collection Time: 11/18/22  2:15 AM  Result Value Ref Range   Prothrombin Time 18.3 (H) 11.4 - 15.2 seconds   INR 1.5 (H) 0.8 - 1.2  Strep pneumoniae urinary antigen     Status: None   Collection Time: 11/18/22  4:54 AM  Result Value Ref Range   Strep Pneumo Urinary Antigen NEGATIVE NEGATIVE  Comprehensive metabolic panel     Status: Abnormal   Collection Time: 11/18/22  4:54 AM  Result Value Ref Range   Sodium 136 135 - 145 mmol/L   Potassium 4.6 3.5 - 5.1 mmol/L   Chloride 102 98 - 111 mmol/L   CO2 26 22 - 32 mmol/L   Glucose, Bld 87 70 - 99 mg/dL   BUN 37 (H) 8 - 23 mg/dL   Creatinine, Ser 0.93 (H) 0.61 - 1.24 mg/dL   Calcium 8.3 (L) 8.9 - 10.3 mg/dL   Total Protein 5.2 (L) 6.5 - 8.1 g/dL   Albumin 3.0 (L) 3.5 - 5.0 g/dL   AST 2,355 (H) 15 - 41 U/L   ALT 1,433 (H) 0 - 44 U/L   Alkaline Phosphatase 109 38 - 126 U/L   Total Bilirubin 1.8 (H) <1.2 mg/dL   GFR, Estimated 38 (L) >60 mL/min   Anion gap 8 5 - 15  CBC     Status: Abnormal   Collection Time: 11/18/22  4:54 AM  Result Value Ref Range   WBC 7.7 4.0 - 10.5 K/uL   RBC 4.10 (L) 4.22 - 5.81 MIL/uL   Hemoglobin 12.4 (L) 13.0 - 17.0 g/dL   HCT 73.2 (L) 20.2 - 54.2 %   MCV 94.6 80.0 - 100.0 fL   MCH 30.2 26.0 - 34.0 pg   MCHC 32.0 30.0 - 36.0 g/dL   RDW 70.6 23.7 - 62.8 %   Platelets 70 (L) 150 - 400 K/uL   nRBC 0.0 0.0 - 0.2 %  Magnesium  Status: None   Collection Time: 11/18/22  4:54 AM  Result Value Ref Range   Magnesium 2.1 1.7 - 2.4 mg/dL  CK     Status: Abnormal   Collection Time: 11/18/22  4:54 AM  Result Value Ref Range   Total CK 692 (H) 49 - 397 U/L  APTT     Status: None   Collection Time: 11/18/22 10:07 AM  Result Value Ref Range   aPTT 33 24 - 36 seconds  Heparin level (unfractionated)     Status: None   Collection Time: 11/18/22   2:20 PM  Result Value Ref Range   Heparin Unfractionated 0.31 0.30 - 0.70 IU/mL    Imaging: ECHOCARDIOGRAM COMPLETE  Result Date: 11/18/2022    ECHOCARDIOGRAM REPORT   Patient Name:   Spencer Coleman Date of Exam: 11/18/2022 Medical Rec #:  [id number]    Height:       70.0 in Accession #:    [id number]   Weight:       103.0 lb Date of Birth:  07-03-1930     BSA:          1.574 m Patient Age:    89 years     BP:           101/68 mmHg Patient Gender: M            HR:           82 bpm. Exam Location:  [facility] Procedure: 2D Echo, Cardiac Doppler and Color Doppler Indications:     CHF  History:         Patient has prior history of Echocardiogram examinations, most                  recent 05/13/2022. CHF, CAD, Pacemaker and Defibrillator,                  Arrythmias:Atrial Fibrillation and Tachycardia; Risk                  Factors:Hypertension and Dyslipidemia. NSTEMI, CKD.  Sonographer:     Mikki Harbor Referring Phys:  [id number] Andris Baumann Diagnosing Phys: Debbe Odea MD IMPRESSIONS  1. Left ventricular ejection fraction, by estimation, is 20 to 25%. The left ventricle has severely decreased function. The left ventricle demonstrates global hypokinesis. The left ventricular internal cavity size was severely dilated. Left ventricular diastolic parameters are indeterminate.  2. Right ventricular systolic function is low normal. The right ventricular size is mildly enlarged. There is normal pulmonary artery systolic pressure.  3. Left atrial size was severely dilated.  4. The mitral valve is normal in structure. Moderate to severe mitral valve regurgitation.  5. The aortic valve is calcified. Aortic valve regurgitation is moderate. Aortic valve sclerosis/calcification is present, without any evidence of aortic stenosis.  6. The inferior vena cava is dilated in size with <50% respiratory variability, suggesting right atrial pressure of 15 mmHg. FINDINGS  Left Ventricle: Left ventricular ejection fraction, by  estimation, is 20 to 25%. The left ventricle has severely decreased function. The left ventricle demonstrates global hypokinesis. The left ventricular internal cavity size was severely dilated. There is no left ventricular hypertrophy. Left ventricular diastolic parameters are indeterminate. Right Ventricle: The right ventricular size is mildly enlarged. No increase in right ventricular wall thickness. Right ventricular systolic function is low normal. There is normal pulmonary artery systolic pressure. The tricuspid regurgitant velocity is 2.11 m/s, and with an assumed right atrial pressure of 15 mmHg,  the estimated right ventricular systolic pressure is 32.8 mmHg. Left Atrium: Left atrial size was severely dilated. Right Atrium: Right atrial size was normal in size. Pericardium: There is no evidence of pericardial effusion. Mitral Valve: The mitral valve is normal in structure. Moderate to severe mitral valve regurgitation. MV peak gradient, 3.6 mmHg. The mean mitral valve gradient is 1.0 mmHg. Tricuspid Valve: The tricuspid valve is normal in structure. Tricuspid valve regurgitation is mild. Aortic Valve: The aortic valve is calcified. Aortic valve regurgitation is moderate. Aortic valve sclerosis/calcification is present, without any evidence of aortic stenosis. Aortic valve mean gradient measures 6.0 mmHg. Aortic valve peak gradient measures 12.1 mmHg. Aortic valve area, by VTI measures 1.91 cm. Pulmonic Valve: The pulmonic valve was normal in structure. Pulmonic valve regurgitation is mild to moderate. Aorta: The aortic root is normal in size and structure. Venous: The inferior vena cava is dilated in size with less than 50% respiratory variability, suggesting right atrial pressure of 15 mmHg. IAS/Shunts: No atrial level shunt detected by color flow Doppler. Additional Comments: A device lead is visualized.  LEFT VENTRICLE PLAX 2D LVIDd:         7.10 cm LVIDs:         6.40 cm LV PW:         0.90 cm LV IVS:         0.90 cm LVOT diam:     2.00 cm LV SV:         62 LV SV Index:   39 LVOT Area:     3.14 cm  LV Volumes (MOD) LV vol d, MOD A2C: 209.0 ml LV vol d, MOD A4C: 202.0 ml LV vol s, MOD A2C: 129.0 ml LV vol s, MOD A4C: 160.0 ml LV SV MOD A2C:     80.0 ml LV SV MOD A4C:     202.0 ml LV SV MOD BP:      54.4 ml RIGHT VENTRICLE RV Basal diam:  4.20 cm RV Mid diam:    3.40 cm RV S prime:     12.30 cm/s LEFT ATRIUM              Index        RIGHT ATRIUM           Index LA diam:        5.70 cm  3.62 cm/m   RA Area:     15.50 cm LA Vol (A2C):   130.0 ml 82.57 ml/m  RA Volume:   42.30 ml  26.87 ml/m LA Vol (A4C):   101.0 ml 64.15 ml/m LA Biplane Vol: 116.0 ml 73.68 ml/m  AORTIC VALVE                     PULMONIC VALVE AV Area (Vmax):    1.86 cm      PV Vmax:       0.88 m/s AV Area (Vmean):   2.07 cm      PV Peak grad:  3.1 mmHg AV Area (VTI):     1.91 cm AV Vmax:           174.00 cm/s AV Vmean:          107.900 cm/s AV VTI:            0.323 m AV Peak Grad:      12.1 mmHg AV Mean Grad:      6.0 mmHg LVOT Vmax:  103.10 cm/s LVOT Vmean:        71.050 cm/s LVOT VTI:          0.197 m LVOT/AV VTI ratio: 0.61  AORTA Ao Root diam: 3.30 cm MITRAL VALVE               TRICUSPID VALVE MV Area (PHT): 4.60 cm    TR Peak grad:   17.8 mmHg MV Area VTI:   2.11 cm    TR Vmax:        211.00 cm/s MV Peak grad:  3.6 mmHg MV Mean grad:  1.0 mmHg    SHUNTS MV Vmax:       0.95 m/s    Systemic VTI:  0.20 m MV Vmean:      51.1 cm/s   Systemic Diam: 2.00 cm MV Decel Time: 165 msec MR Peak grad: 108.2 mmHg MR Mean grad: 61.0 mmHg MR Vmax:      520.00 cm/s MR Vmean:     360.0 cm/s MV E velocity: 74.30 cm/s MV A velocity: 84.20 cm/s MV E/A ratio:  0.88 Debbe Odea MD Electronically signed by Debbe Odea MD Signature Date/Time: 11/18/2022/4:47:44 PM    Final    DG Swallowing Func-Speech Pathology  Result Date: 11/18/2022 Table formatting from the original result was not included. Modified Barium Swallow Study Patient Details  Name: Spencer Coleman MRN: [id number] Date of Birth: 1930-05-13 Today's Date: 11/18/2022 HPI/PMH: Pt is a 89 y.o. male past medical history significant for Dementia, paroxysmal atrial fibrillation on Eliquis, ventricular tachycardia with ICD, hyperlipidemia, hypertension, CHF with reduced EF, presents to the emergency department following a fall.  History is provided by the patient's sister at bedside.  States that he had a fall yesterday out of bed and was unable to get up.  States that he has had falls in the past but has always been able to get up on his own.  Laid on the ground throughout the night.  EMS assisted him up this morning.  Again had another fall later this afternoon.  Pt lives at home Alone.  CT of Chest: Right-sided volume loss with elevation of the right hemidiaphragm  and right middle and lower lobar collapse and consolidation.  Superimposed extensive airway impaction within the right lower lobe  and, to a lesser extent, right middle lobe and peribronchial  nodularity which may relate to superimposed changes aspiration or  acute infection. Trace right pleural effusion.  3. Mild to moderate global cardiomegaly with left ventricular  dilation. Extensive multi-vessel coronary artery calcification.  Morphologic changes in keeping with pulmonary arterial hypertension.  Head CT: No evidence of acute infarct, hemorrhage, mass, mass effect,  or midline shift. No hydrocephalus or extra-axial fluid collection.  Age related cerebral atrophy. Clinical Impression Patient presents with functional oropharyngeal swallowing for Age. NO aspiration nor laryngeal penetration noted w/ any consistency. Pt fed self w/ setup support. Intermittent verbal cues given; baseline Cognitive decline/Dementia dx per chart. Oral stage is characterized by adequate lip closure, but min disorganized bolus preparation and containment followed by min decreased control of liquid boluses during anterior to posterior transit. Trace+ oral residue  noted diffusely in oral cavity on tongue and upper denture plate; a f/u Dry swallow cleared this residue. Min increased mastication time/effort noted w/ soft solid d/t impact of Lacking Bottom Dentition (upper denture plate only).  Swallow initiation occurs at the level of the valleculae w/ majority of trials; thin liquids spilled inconsistently from the valleculae. Pharyngeal stage is noted for  mildly reduced tongue base retraction (likely age-related), adequate hyolaryngeal excursion, and adequate pharyngeal constriction. Epiglottic deflection is complete; there is NO penetration nor aspiration. There is trace-min BOT, valleculae and intermittently aeryepiglottic folds/pyriform sinus residue which cleared w/ f/u Dry swallow. Pharyngeal stripping wave is complete. Amplitude/duration of cricopharyngeus opening is WFL. There is adequate/complete clearance through the cervical esophagus. No retrograde activity noted in the cervical esophagus.  Consistencies tested were thin liquids x2 tsps, 3 cup sip, 2-3 sequential sips slowly, nectar x1 tsp, 1 cup sip, honey x1 tsp, pudding x1 tsp, regular solid (1/2 graham cracker with pudding). Would recommend Pills given in a Puree for the increased viscosity and d/t advanced Age of pt. Recommend continue a farily regular/mech soft diet consistency with thin liquids VIA CUP; general aspiration precautions including SMALL, SINGLE sips SLOWLY and moistening meats/foods for easier mastication. Recommend a Dry swallow after bites and sips -- BUT pt already does a Dry swallow independently.  No further ST services indicated.  IF it is felt that he could have REFLUX activty contributing to risk for aspiration of retrograde, Reflux activity, the recommend f/u w/ GI for assessment.  MD and NSG made aware of the MBSS results and recommendations. Factors that may increase risk of adverse event in presence of aspiration Rubye Oaks & Clearance Coots 2021): Factors that may increase risk of adverse  event in presence of aspiration Rubye Oaks & Clearance Coots 2021): Poor general health and/or compromised immunity; Reduced cognitive function; Frail or deconditioned (Lacking Bottom Dentition) Recommendations/Plan: Swallowing Evaluation Recommendations Swallowing Evaluation Recommendations Recommendations: PO diet PO Diet Recommendation: Dysphagia 3 (Mechanical soft); Thin liquids (Level 0) Liquid Administration via: Cup; No straw Medication Administration: Whole meds with puree (vs Crushed) Supervision: Patient able to self-feed; Set-up assistance for safety (monitor for needs) Swallowing strategies  : Minimize environmental distractions; Slow rate; Small bites/sips; Multiple dry swallows after each bite/sip Postural changes: Position pt fully upright for meals; Stay upright 30-60 min after meals Oral care recommendations: Oral care BID (2x/day); Pt independent with oral care (staff support) Recommended consults: -- (Dietician; Palliative Care for GOC) Treatment Plan Treatment Plan Treatment recommendations: No treatment recommended at this time Follow-up recommendations: No SLP follow up Recommendations Comment: appears at his baseline Functional status assessment: Patient has not had a recent decline in their functional status. Treatment frequency: -- (n/a) Treatment duration: -- (n/a) Interventions: Aspiration precaution training; Patient/family education Recommendations Recommendations for follow up therapy are one component of a multi-disciplinary discharge planning process, led by the attending physician.  Recommendations may be updated based on patient status, additional functional criteria and insurance authorization. Assessment: Orofacial Exam: Orofacial Exam Oral Cavity: Oral Hygiene: WFL Oral Cavity - Dentition: Edentulous; Dentures, top (on bottom) Orofacial Anatomy: WFL Oral Motor/Sensory Function: WFL Anatomy: Anatomy: WFL Boluses Administered: Boluses Administered Boluses Administered: Thin liquids (Level 0);  Mildly thick liquids (Level 2, nectar thick); Moderately thick liquids (Level 3, honey thick); Puree; Solid  Oral Impairment Domain: Oral Impairment Domain Lip Closure: No labial escape Tongue control during bolus hold: Escape to lateral buccal cavity/floor of mouth; Posterior escape of less than half of bolus Bolus preparation/mastication: Disorganized chewing/mashing with solid pieces of bolus unchewed (min d/t edentulous on bottom; top denture plate only) Bolus transport/lingual motion: Brisk tongue motion Oral residue: Trace residue lining oral structures (diffuse) Location of oral residue : Tongue (denture plate) Initiation of pharyngeal swallow : Valleculae; Posterior laryngeal surface of the epiglottis (spilling from valleculae w/ thin liquids)  Pharyngeal Impairment Domain: Pharyngeal Impairment Domain Soft palate elevation:  No bolus between soft palate (SP)/pharyngeal wall (PW) Laryngeal elevation: Complete superior movement of thyroid cartilage with complete approximation of arytenoids to epiglottic petiole Anterior hyoid excursion: Complete anterior movement Epiglottic movement: Complete inversion Laryngeal vestibule closure: Complete, no air/contrast in laryngeal vestibule Pharyngeal stripping wave : Present - diminished (functional) Pharyngeal contraction (A/P view only): N/A Pharyngoesophageal segment opening: Complete distension and complete duration, no obstruction of flow Tongue base retraction: No contrast between tongue base and posterior pharyngeal wall (PPW) Pharyngeal residue: Trace residue within or on pharyngeal structures Location of pharyngeal residue: Tongue base; Valleculae; Aryepiglottic folds (cleared w/ f/u swallow)  Esophageal Impairment Domain: Esophageal Impairment Domain Esophageal clearance upright position: Complete clearance, esophageal coating Pill: No data recorded Penetration/Aspiration Scale Score: Penetration/Aspiration Scale Score 1.  Material does not enter airway: Thin  liquids (Level 0); Mildly thick liquids (Level 2, nectar thick); Moderately thick liquids (Level 3, honey thick); Puree; Solid Compensatory Strategies: Compensatory Strategies Compensatory strategies: Yes Multiple swallows: Effective (f/u dry swallow x1) Effective Multiple Swallows: Thin liquid (Level 0); Mildly thick liquid (Level 2, nectar thick); Moderately thick liquid (Level 3, honey thick); Puree; Solid   General Information: Caregiver present: No  Diet Prior to this Study: NPO   Temperature : Normal (wbc 7.7)   Respiratory Status: WFL   Supplemental O2: None (Room air)   History of Recent Intubation: No  Behavior/Cognition: Alert; Cooperative; Pleasant mood; Distractible; Requires cueing (baseline Dementia dx) Self-Feeding Abilities: Able to self-feed; Needs set-up for self-feeding Baseline vocal quality/speech: Normal Volitional Cough: Able to elicit Volitional Swallow: Able to elicit Exam Limitations: No limitations Goal Planning: Prognosis for improved oropharyngeal function: Good Barriers to Reach Goals: Cognitive deficits Barriers/Prognosis Comment: baseline Dementia Patient/Family Stated Goal: "something to drink" Consulted and agree with results and recommendations: Patient; Nurse; Physician Pain: Pain Assessment Pain Assessment: No/denies pain End of Session: Start Time:SLP Start Time (ACUTE ONLY): 0915 Stop Time: SLP Stop Time (ACUTE ONLY): 1015 Time Calculation:SLP Time Calculation (min) (ACUTE ONLY): 60 min Charges: SLP Evaluations $ SLP Speech Visit: 1 Visit SLP Evaluations $MBS Swallow: 1 Procedure SLP visit diagnosis: SLP Visit Diagnosis: Dysphagia, unspecified (R13.10) Past Medical History: Past Medical History: Diagnosis Date  CAD (coronary artery disease)   Nonobstructive cardiac disease by cath  CHF (congestive heart failure) (HCC)   nonischemic cardiomyopathy  CKD (chronic kidney disease)   History of kidney stones   Hyperlipidemia   Hypertension   Inguinal hernia   PAF (paroxysmal atrial  fibrillation) (HCC)   Patella fracture 01/09/2015  Renal colic 10/07/2011  Ventricular tachycardia (HCC)   s/p ICD Past Surgical History: Past Surgical History: Procedure Laterality Date  CARDIAC CATHETERIZATION    CARDIAC DEFIBRILLATOR PLACEMENT  2009  CYSTOSCOPY WITH LITHOLAPAXY N/A 02/21/2018  Procedure: CYSTOSCOPY WITH LITHOLAPAXY;  Surgeon: Sondra Come, MD;  Location: [facility];  Service: Urology;  Laterality: N/A;  ELBOW SURGERY Left   HOLMIUM LASER APPLICATION N/A 02/21/2018  Procedure: HOLMIUM LASER APPLICATION;  Surgeon: Sondra Come, MD;  Location: [facility];  Service: Urology;  Laterality: N/A;  ICD GENERATOR CHANGEOUT N/A 01/28/2021  Procedure: ICD GENERATOR CHANGEOUT;  Surgeon: Marcina Millard, MD;  Location: [facility];  Service: Cardiovascular;  Laterality: N/A;  INGUINAL HERNIA REPAIR    LITHOTRIPSY    ORIF left wrist fracture Left   ORIF PATELLA Left 01/10/2015  Procedure: OPEN REDUCTION INTERNAL (ORIF) FIXATION PATELLA;  Surgeon: Donato Heinz, MD;  Location: [facility];  Service: Orthopedics;  Laterality: Left;  ORIF PATELLA Left 02/06/2015  Procedure: OPEN REDUCTION INTERNAL (ORIF) FIXATION PATELLA;  Surgeon: Donato Heinz, MD;  Location: [facility];  Service: Orthopedics;  Laterality: Left;  TRANSURETHRAL RESECTION OF PROSTATE N/A 02/21/2018  Procedure: TRANSURETHRAL RESECTION OF THE PROSTATE (TURP);  Surgeon: Sondra Come, MD;  Location: [facility];  Service: Urology;  Laterality: N/A; Jerilynn Som, MS, CCC-SLP Speech Language Pathologist Rehab Services; [facility] [place] [phone] (ascom) Watson,Katherine 11/18/2022, 3:19 PM  US Abdomen Limited RUQ (LIVER/GB)  Result Date: 11/18/2022 CLINICAL DATA:  Abnormal LFTs EXAM: ULTRASOUND ABDOMEN LIMITED RIGHT UPPER QUADRANT COMPARISON:  None Available. FINDINGS: Gallbladder: Gallbladder wall thickening. Gallstones, largest measures 1.2 cm. Pericholecystic fluid. No sonographic Murphy sign noted by sonographer. Common bile  duct: Diameter: 2 mm Liver: No focal lesion identified. Increased parenchymal echogenicity. Portal vein is patent on color Doppler imaging with normal direction of blood flow towards the liver. Other: None. IMPRESSION: 1. Gallstones, gallbladder wall thickening and pericholecystic fluid. Although findings are concerning for acute cholecystitis, sonographic Eulah Pont sign is negative and findings could also be secondary to systemic process. Recommend clinical correlation. 2. Hepatic steatosis. Electronically Signed   By: Allegra Lai M.D.   On: 11/18/2022 10:40   CT Angio Chest Pulmonary Embolism (PE) W or WO Contrast  Result Date: 11/24/2022 CLINICAL DATA:  Pulmonary embolism (PE) suspected, high prob RLE swelling, elevated trop, elevated LFTs, AMS, falls; Abdominal pain, acute, nonlocalized elevated LFTs and t bili EXAM: CT ANGIOGRAPHY CHEST CT ABDOMEN AND PELVIS WITH CONTRAST TECHNIQUE: Multidetector CT imaging of the chest was performed using the standard protocol during bolus administration of intravenous contrast. Multiplanar CT image reconstructions and MIPs were obtained to evaluate the vascular anatomy. Multidetector CT imaging of the abdomen and pelvis was performed using the standard protocol during bolus administration of intravenous contrast. RADIATION DOSE REDUCTION: This exam was performed according to the departmental dose-optimization program which includes automated exposure control, adjustment of the mA and/or kV according to patient size and/or use of iterative reconstruction technique. CONTRAST:  OMNIPAQUE IOHEXOL 350 MG/ML SOLN COMPARISON:  06/12/2021 FINDINGS: CTA CHEST FINDINGS Cardiovascular: Apparent filling defects within the right middle and lower lobar pulmonary arteries are artifactual and related to pulmonary vascular redistribution of setting of consolidation. Delayed imaging on CT examination of the abdomen pelvis demonstrates patency of these structures and no intraluminal  filling defect identified to suggest pulmonary embolism. The central pulmonary arteries, however, are enlarged in keeping with changes of pulmonary arterial hypertension. Extensive multi-vessel coronary artery calcification. Extensive calcification of the aortic valve leaflets. Mild to moderate global cardiomegaly with left ventricular dilation. Left subclavian pacemaker in place with leads within the right atrium, right ventricle, and left ventricular venous outflow. No pericardial effusion. Moderate atherosclerotic calcification within the thoracic aorta. No aortic aneurysm. Mediastinum/Nodes: Visualized thyroid is unremarkable. No pathologic thoracic adenopathy. Esophagus unremarkable. Lungs/Pleura: There is right-sided volume loss with elevation of the right hemidiaphragm and right middle and lower lobar collapse and consolidation. Superimposed extensive airway impaction within the right lower lobe and, to a lesser extent, right middle lobe and peribronchial nodularity is seen which may relate to superimposed changes aspiration or acute infection. Trace right pleural effusion. No pneumothorax. Musculoskeletal: No acute bone abnormality. No lytic or blastic bone lesion. Review of the MIP images confirms the above findings. CT ABDOMEN and PELVIS FINDINGS Hepatobiliary: Cholelithiasis without pericholecystic inflammatory changes identified. Liver unremarkable. No intra or extrahepatic biliary ductal dilation. Pancreas: Unremarkable Spleen: Unremarkable Adrenals/Urinary Tract: The adrenal glands are unremarkable. The kidneys are mildly atrophic. Multiple  simple cortical and parapelvic cysts are seen within the kidneys bilaterally for which no follow-up imaging is recommended. The kidneys are otherwise unremarkable. The bladder contains a 15 mm dependently layering calculus. The bladder is not distended. Stomach/Bowel: The stomach, small bowel, and large bowel are unremarkable. The appendix is absent. No free  intraperitoneal gas or fluid. Vascular/Lymphatic: Extensive aortoiliac atherosclerotic calcification. Extensive visceral arteriosclerosis. No aortic aneurysm. No pathologic adenopathy within the abdomen and pelvis. Reproductive: Prostate is unremarkable. Other: No abdominal wall hernia. Diffuse subcutaneous body wall edema is present in keeping with changes of mild anasarca. Musculoskeletal: Osseous structures are age-appropriate. Diffuse osteopenia. No acute bone abnormality. No lytic or blastic bone lesion. Review of the MIP images confirms the above findings. IMPRESSION: 1. No pulmonary embolism. 2. Right-sided volume loss with elevation of the right hemidiaphragm and right middle and lower lobar collapse and consolidation. Superimposed extensive airway impaction within the right lower lobe and, to a lesser extent, right middle lobe and peribronchial nodularity which may relate to superimposed changes aspiration or acute infection. Trace right pleural effusion. 3. Mild to moderate global cardiomegaly with left ventricular dilation. Extensive multi-vessel coronary artery calcification. Morphologic changes in keeping with pulmonary arterial hypertension. 4. Cholelithiasis. 5. 15 mm dependently layering calculus within the bladder. 6. Mild anasarca. Aortic Atherosclerosis (ICD10-I70.0). Electronically Signed   By: Helyn Numbers M.D.   On: 12/08/2022 22:15   CT ABDOMEN PELVIS W CONTRAST  Result Date: 11/30/2022 CLINICAL DATA:  Pulmonary embolism (PE) suspected, high prob RLE swelling, elevated trop, elevated LFTs, AMS, falls; Abdominal pain, acute, nonlocalized elevated LFTs and t bili EXAM: CT ANGIOGRAPHY CHEST CT ABDOMEN AND PELVIS WITH CONTRAST TECHNIQUE: Multidetector CT imaging of the chest was performed using the standard protocol during bolus administration of intravenous contrast. Multiplanar CT image reconstructions and MIPs were obtained to evaluate the vascular anatomy. Multidetector CT imaging of the  abdomen and pelvis was performed using the standard protocol during bolus administration of intravenous contrast. RADIATION DOSE REDUCTION: This exam was performed according to the departmental dose-optimization program which includes automated exposure control, adjustment of the mA and/or kV according to patient size and/or use of iterative reconstruction technique. CONTRAST:  OMNIPAQUE IOHEXOL 350 MG/ML SOLN COMPARISON:  06/12/2021 FINDINGS: CTA CHEST FINDINGS Cardiovascular: Apparent filling defects within the right middle and lower lobar pulmonary arteries are artifactual and related to pulmonary vascular redistribution of setting of consolidation. Delayed imaging on CT examination of the abdomen pelvis demonstrates patency of these structures and no intraluminal filling defect identified to suggest pulmonary embolism. The central pulmonary arteries, however, are enlarged in keeping with changes of pulmonary arterial hypertension. Extensive multi-vessel coronary artery calcification. Extensive calcification of the aortic valve leaflets. Mild to moderate global cardiomegaly with left ventricular dilation. Left subclavian pacemaker in place with leads within the right atrium, right ventricle, and left ventricular venous outflow. No pericardial effusion. Moderate atherosclerotic calcification within the thoracic aorta. No aortic aneurysm. Mediastinum/Nodes: Visualized thyroid is unremarkable. No pathologic thoracic adenopathy. Esophagus unremarkable. Lungs/Pleura: There is right-sided volume loss with elevation of the right hemidiaphragm and right middle and lower lobar collapse and consolidation. Superimposed extensive airway impaction within the right lower lobe and, to a lesser extent, right middle lobe and peribronchial nodularity is seen which may relate to superimposed changes aspiration or acute infection. Trace right pleural effusion. No pneumothorax. Musculoskeletal: No acute bone abnormality. No  lytic or blastic bone lesion. Review of the MIP images confirms the above findings. CT ABDOMEN and PELVIS FINDINGS Hepatobiliary:  Cholelithiasis without pericholecystic inflammatory changes identified. Liver unremarkable. No intra or extrahepatic biliary ductal dilation. Pancreas: Unremarkable Spleen: Unremarkable Adrenals/Urinary Tract: The adrenal glands are unremarkable. The kidneys are mildly atrophic. Multiple simple cortical and parapelvic cysts are seen within the kidneys bilaterally for which no follow-up imaging is recommended. The kidneys are otherwise unremarkable. The bladder contains a 15 mm dependently layering calculus. The bladder is not distended. Stomach/Bowel: The stomach, small bowel, and large bowel are unremarkable. The appendix is absent. No free intraperitoneal gas or fluid. Vascular/Lymphatic: Extensive aortoiliac atherosclerotic calcification. Extensive visceral arteriosclerosis. No aortic aneurysm. No pathologic adenopathy within the abdomen and pelvis. Reproductive: Prostate is unremarkable. Other: No abdominal wall hernia. Diffuse subcutaneous body wall edema is present in keeping with changes of mild anasarca. Musculoskeletal: Osseous structures are age-appropriate. Diffuse osteopenia. No acute bone abnormality. No lytic or blastic bone lesion. Review of the MIP images confirms the above findings. IMPRESSION: 1. No pulmonary embolism. 2. Right-sided volume loss with elevation of the right hemidiaphragm and right middle and lower lobar collapse and consolidation. Superimposed extensive airway impaction within the right lower lobe and, to a lesser extent, right middle lobe and peribronchial nodularity which may relate to superimposed changes aspiration or acute infection. Trace right pleural effusion. 3. Mild to moderate global cardiomegaly with left ventricular dilation. Extensive multi-vessel coronary artery calcification. Morphologic changes in keeping with pulmonary arterial  hypertension. 4. Cholelithiasis. 5. 15 mm dependently layering calculus within the bladder. 6. Mild anasarca. Aortic Atherosclerosis (ICD10-I70.0). Electronically Signed   By: Helyn Numbers M.D.   On: 12/10/2022 22:15    Assessment and Plan: This is a 89 y.o. male with elevated LFTs and possible cholecystitis.  --Discussed with the patient the findings on his labs and imaging.  Although there are changes to his gallbladder, he is currently asymptomatic.  Furthermore, if he had cholecystitis, this would not explain the degree of elevated of his AST and ALT.  Appreciate GI and IR evaluation of the patient.  Agree that it's less likely for this to be cholecystitis but rather some degree of ischemia.  This could be explained by his dizziness and falls, particularly in the setting of such diminished cardiac function with EF of 20-25%.  At this point, do not think this patient needs any surgical intervention.   --No dietary restrictions from surgical standpoint, would defer to SLP recommendations for his diet. --Will be available if any issues/concerns.  I spent 60 minutes dedicated to the care of this patient on the date of this encounter to include pre-visit review of records, face-to-face time with the patient discussing diagnosis and management, and any post-visit coordination of care.   Howie Ill, MD [facility]:  [phone]

## 2022-11-18 NOTE — ED Notes (Signed)
Lab draw requested

## 2022-11-18 NOTE — ED Notes (Signed)
Ria Comment, PA at bedside

## 2022-11-18 NOTE — Consult Note (Signed)
[facility] C[facility] CONSULT NOTE       Patient ID: RY KHAM MRN: [id number] DOB/AGE: [birth year] 89 y.o.  Admit date: 12/03/2022 Referring Physician Spencer Royal, MD Primary Physician Spencer Reichmann, MD Primary Cardiologist Spencer Millard, MD & Spencer Kindred, FNP Reason for Consultation CHF  HPI: [facility] is a 89 y.o. 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 Cardiology was consulted for further evaluation.   Majority of patient's history obtained from chart review due to patient's baseline dementia. Patient's sister reported as historian from ED notes.  Patient presented to the ED after falling out of bed on the night of 11/6 and unable to get up. Patient laid on the ground throughout the night and EMS helped patient get up. Denies any head injury or loss of consciousness. Patient states he's been having weakness in both legs. Patient endorses LE swelling, but states has chronic swelling to his right leg. Denies nausea/vomiting/diarrhea. Patient's oldest daughter is his healthcare power of attorney.   Work up in the ED notable for Na 133, K 4.4, bicarb 20, Cr 1.92, Alk Phos 134, AST 1,751, ALT 1,049, total bili 2.6, GFR 33, Hgb 12.0, Platelets 75. CK 920. Lactic Acid 1.8. Troponin 476 > 356. BNP > 4,500. EKG appears to be sinus rhythm PVCs with rate 67 bpm. CT cervical spine, CT head showed no acute abnormalities. CTA Chest showed no pulmonary embolism, elevated right hemidiaphragm and right middle and lower lobar collapse and consolidation, trace pleural effusions. Patient vitals upon admission was BP 99/51, HR 74 bpm,  afebrile. Patient was started on fluids, cefepime and vancomycin.   At the time of my evaluation this morning, patient was laying in his hospital bed in no acute distress at an incline on 2L [place]. Patient reports he's fallen in his bathroom a few times over the past month. Patient states he lives alone. Patient denies chest pain or shortness of breath. Patients BP and HR were stable while in the room. He appears stable, resting in bed.  Review of systems complete and found to be negative unless listed above    Past Medical History:  Diagnosis Date   CAD (coronary artery disease)    Nonobstructive cardiac disease by cath   CHF (congestive heart failure) (HCC)    nonischemic cardiomyopathy   CKD (chronic kidney disease)    History of kidney stones    Hyperlipidemia    Hypertension    Inguinal hernia    PAF (paroxysmal atrial fibrillation) (HCC)    Patella fracture 01/09/2015   Renal colic 10/07/2011   Ventricular tachycardia [facility])    s/p ICD    Past Surgical History:  Procedure Laterality Date   CARDIAC CATHETERIZATION     CARDIAC DEFIBRILLATOR PLACEMENT  2009   CYSTOSCOPY WITH LITHOLAPAXY N/A 02/21/2018   Procedure: CYSTOSCOPY WITH LITHOLAPAXY;  Surgeon: Sondra Come, MD;  Location: [facility] ORS;  Service: Urology;  Laterality: N/A;   ELBOW SURGERY Left    HOLMIUM LASER APPLICATION N/A 02/21/2018   Procedure: HOLMIUM LASER APPLICATION;  Surgeon: Sondra Come, MD;  Location: [facility] ORS;  Service: Urology;  Laterality: N/A;   ICD GENERATOR CHANGEOUT N/A  01/28/2021   Procedure: ICD GENERATOR CHANGEOUT;  Surgeon: Spencer Millard, MD;  Location: [facility] INVASIVE CV LAB;  Service: Cardiovascular;  Laterality: N/A;   INGUINAL HERNIA REPAIR     LITHOTRIPSY     ORIF left wrist fracture Left    ORIF PATELLA Left 01/10/2015   Procedure: OPEN REDUCTION INTERNAL (ORIF) FIXATION PATELLA;  Surgeon: Donato Heinz, MD;  Location: [facility] ORS;  Service: Orthopedics;  Laterality: Left;   ORIF  PATELLA Left 02/06/2015   Procedure: OPEN REDUCTION INTERNAL (ORIF) FIXATION PATELLA;  Surgeon: Donato Heinz, MD;  Location: [facility] ORS;  Service: Orthopedics;  Laterality: Left;   TRANSURETHRAL RESECTION OF PROSTATE N/A 02/21/2018   Procedure: TRANSURETHRAL RESECTION OF THE PROSTATE (TURP);  Surgeon: Sondra Come, MD;  Location: [facility] ORS;  Service: Urology;  Laterality: N/A;    (Not in a hospital admission)  Social History   Socioeconomic History   Marital status: Divorced    Spouse name: Not on file   Number of children: Not on file   Years of education: Not on file   Highest education level: Not on file  Occupational History   Occupation: retired  Tobacco Use   Smoking status: Never   Smokeless tobacco: Never  Vaping Use   Vaping status: Never Used  Substance and Sexual Activity   Alcohol use: No    Alcohol/week: 0.0 standard drinks of alcohol   Drug use: No   Sexual activity: Not on file  Other Topics Concern   Not on file  Social History Narrative   Lives at home. Independent, no assisted devices at baseline   Social Determinants of Health   Financial Resource Strain: Low Risk  (06/03/2022)   Received from [facility], [facility]   Overall Financial Resource Strain (CARDIA)    Difficulty of Paying Living Expenses: Not hard at all  Food Insecurity: No Food Insecurity (06/03/2022)   Received from [facility], [facility]   Hunger Vital Sign    Worried About Running Out of Food in the Last Year: Never true    Ran Out of Food in the Last Year: Never true  Transportation Needs: No Transportation Needs (06/03/2022)   Received from [facility], [facility]   [facility] - Transportation    In the past 12 months, has lack of transportation kept you from medical appointments or from getting medications?: No    Lack of Transportation (Non-Medical): No  Physical Activity:  Not on file  Stress: Not on file  Social Connections: Not on file  Intimate Partner Violence: Not At Risk (05/16/2022)   Humiliation, Afraid, Rape, and Kick questionnaire    Fear of Current or Ex-Partner: No    Emotionally Abused: No    Physically Abused: No    Sexually Abused: No    Family History  Problem Relation Age of Onset   CAD Mother    Kidney cancer Father    Prostate cancer Neg Hx    Bladder Cancer Neg Hx      Vitals:   11/18/22 0830 11/18/22 0900 11/18/22 1152 11/18/22 1307  BP: 99/72 90/62  102/63  Pulse: 79 (!) 110  71  Resp: (!) 23   16  Temp:  98.1 F (36.7 C) 97.9 F (36.6 C)   TempSrc:  Oral Oral   SpO2: 99% 99%  99%  Weight:      Height:        PHYSICAL  EXAM General: Elderly appearing male, well nourished, in no acute distress laying in his hospital bed at an incline HEENT: Normocephalic and atraumatic. Neck: No JVD.   Lungs: Normal respiratory effort on 2L . Crackles and rhonchi bilaterally to auscultation.  Heart: HRRR. Normal S1 and S2 without gallops or murmurs..  Abdomen: Non-distended appearing.  Msk: Normal strength and tone for age. Extremities: Warm and well perfused. No clubbing, cyanosis. 2+ pitting edema.  Neuro: Alert and oriented X 3. Psych: Answers questions appropriately.   Labs: Basic Metabolic Panel: Recent Labs    11/15/2022 1404 11/18/22 0454  NA 133* 136  K 4.4 4.6  CL 104 102  CO2 20* 26  GLUCOSE 96 87  BUN 37* 37*  CREATININE 1.92* 1.71*  CALCIUM 8.2* 8.3*  MG  --  2.1   Liver Function Tests: Recent Labs    11/12/2022 1718 11/18/22 0454  AST 1,751* 2,087*  ALT 1,049* 1,433*  ALKPHOS 134* 109  BILITOT 2.6* 1.8*  PROT 5.9* 5.2*  ALBUMIN 3.4* 3.0*   No results for input(s): "LIPASE", "AMYLASE" in the last 72 hours. CBC: Recent Labs    12/04/2022 1404 11/18/22 0454  WBC 8.9 7.7  NEUTROABS 7.8*  --   HGB 12.0* 12.4*  HCT 36.5* 38.8*  MCV 94.1 94.6  PLT 75* 70*   Cardiac Enzymes: Recent Labs     12/05/2022 1718 12/01/2022 2249 11/18/22 0454  CKTOTAL 920*  --  692*  TROPONINIHS 476* 356*  --    BNP: Recent Labs    11/26/2022 1404  BNP >4,500.0*   D-Dimer: No results for input(s): "DDIMER" in the last 72 hours. Hemoglobin A1C: No results for input(s): "HGBA1C" in the last 72 hours. Fasting Lipid Panel: No results for input(s): "CHOL", "HDL", "LDLCALC", "TRIG", "CHOLHDL", "LDLDIRECT" in the last 72 hours. Thyroid Function Tests: No results for input(s): "TSH", "T4TOTAL", "T3FREE", "THYROIDAB" in the last 72 hours.  Invalid input(s): "FREET3" Anemia Panel: No results for input(s): "VITAMINB12", "FOLATE", "FERRITIN", "TIBC", "IRON", "RETICCTPCT" in the last 72 hours.   Radiology: US Abdomen Limited RUQ (LIVER/GB)  Result Date: 11/18/2022 CLINICAL DATA:  Abnormal LFTs EXAM: ULTRASOUND ABDOMEN LIMITED RIGHT UPPER QUADRANT COMPARISON:  None Available. FINDINGS: Gallbladder: Gallbladder wall thickening. Gallstones, largest measures 1.2 cm. Pericholecystic fluid. No sonographic Murphy sign noted by sonographer. Common bile duct: Diameter: 2 mm Liver: No focal lesion identified. Increased parenchymal echogenicity. Portal vein is patent on color Doppler imaging with normal direction of blood flow towards the liver. Other: None. IMPRESSION: 1. Gallstones, gallbladder wall thickening and pericholecystic fluid. Although findings are concerning for acute cholecystitis, sonographic Eulah Pont sign is negative and findings could also be secondary to systemic process. Recommend clinical correlation. 2. Hepatic steatosis. Electronically Signed   By: Allegra Lai M.D.   On: 11/18/2022 10:40   CT Angio Chest Pulmonary Embolism (PE) W or WO Contrast  Result Date: 12/03/2022 CLINICAL DATA:  Pulmonary embolism (PE) suspected, high prob RLE swelling, elevated trop, elevated LFTs, AMS, falls; Abdominal pain, acute, nonlocalized elevated LFTs and t bili EXAM: CT ANGIOGRAPHY CHEST CT ABDOMEN AND PELVIS WITH  CONTRAST TECHNIQUE: Multidetector CT imaging of the chest was performed using the standard protocol during bolus administration of intravenous contrast. Multiplanar CT image reconstructions and MIPs were obtained to evaluate the vascular anatomy. Multidetector CT imaging of the abdomen and pelvis was performed using the standard protocol during bolus administration of intravenous contrast. RADIATION DOSE REDUCTION: This exam was performed according to the departmental dose-optimization program which includes automated  exposure control, adjustment of the mA and/or kV according to patient size and/or use of iterative reconstruction technique. CONTRAST:  OMNIPAQUE IOHEXOL 350 MG/ML SOLN COMPARISON:  06/12/2021 FINDINGS: CTA CHEST FINDINGS Cardiovascular: Apparent filling defects within the right middle and lower lobar pulmonary arteries are artifactual and related to pulmonary vascular redistribution of setting of consolidation. Delayed imaging on CT examination of the abdomen pelvis demonstrates patency of these structures and no intraluminal filling defect identified to suggest pulmonary embolism. The central pulmonary arteries, however, are enlarged in keeping with changes of pulmonary arterial hypertension. Extensive multi-vessel coronary artery calcification. Extensive calcification of the aortic valve leaflets. Mild to moderate global cardiomegaly with left ventricular dilation. Left subclavian pacemaker in place with leads within the right atrium, right ventricle, and left ventricular venous outflow. No pericardial effusion. Moderate atherosclerotic calcification within the thoracic aorta. No aortic aneurysm. Mediastinum/Nodes: Visualized thyroid is unremarkable. No pathologic thoracic adenopathy. Esophagus unremarkable. Lungs/Pleura: There is right-sided volume loss with elevation of the right hemidiaphragm and right middle and lower lobar collapse and consolidation. Superimposed extensive airway  impaction within the right lower lobe and, to a lesser extent, right middle lobe and peribronchial nodularity is seen which may relate to superimposed changes aspiration or acute infection. Trace right pleural effusion. No pneumothorax. Musculoskeletal: No acute bone abnormality. No lytic or blastic bone lesion. Review of the MIP images confirms the above findings. CT ABDOMEN and PELVIS FINDINGS Hepatobiliary: Cholelithiasis without pericholecystic inflammatory changes identified. Liver unremarkable. No intra or extrahepatic biliary ductal dilation. Pancreas: Unremarkable Spleen: Unremarkable Adrenals/Urinary Tract: The adrenal glands are unremarkable. The kidneys are mildly atrophic. Multiple simple cortical and parapelvic cysts are seen within the kidneys bilaterally for which no follow-up imaging is recommended. The kidneys are otherwise unremarkable. The bladder contains a 15 mm dependently layering calculus. The bladder is not distended. Stomach/Bowel: The stomach, small bowel, and large bowel are unremarkable. The appendix is absent. No free intraperitoneal gas or fluid. Vascular/Lymphatic: Extensive aortoiliac atherosclerotic calcification. Extensive visceral arteriosclerosis. No aortic aneurysm. No pathologic adenopathy within the abdomen and pelvis. Reproductive: Prostate is unremarkable. Other: No abdominal wall hernia. Diffuse subcutaneous body wall edema is present in keeping with changes of mild anasarca. Musculoskeletal: Osseous structures are age-appropriate. Diffuse osteopenia. No acute bone abnormality. No lytic or blastic bone lesion. Review of the MIP images confirms the above findings. IMPRESSION: 1. No pulmonary embolism. 2. Right-sided volume loss with elevation of the right hemidiaphragm and right middle and lower lobar collapse and consolidation. Superimposed extensive airway impaction within the right lower lobe and, to a lesser extent, right middle lobe and peribronchial nodularity which  may relate to superimposed changes aspiration or acute infection. Trace right pleural effusion. 3. Mild to moderate global cardiomegaly with left ventricular dilation. Extensive multi-vessel coronary artery calcification. Morphologic changes in keeping with pulmonary arterial hypertension. 4. Cholelithiasis. 5. 15 mm dependently layering calculus within the bladder. 6. Mild anasarca. Aortic Atherosclerosis (ICD10-I70.0). Electronically Signed   By: Helyn Numbers M.D.   On: 11/18/2022 22:15   CT ABDOMEN PELVIS W CONTRAST  Result Date: 12/05/2022 CLINICAL DATA:  Pulmonary embolism (PE) suspected, high prob RLE swelling, elevated trop, elevated LFTs, AMS, falls; Abdominal pain, acute, nonlocalized elevated LFTs and t bili EXAM: CT ANGIOGRAPHY CHEST CT ABDOMEN AND PELVIS WITH CONTRAST TECHNIQUE: Multidetector CT imaging of the chest was performed using the standard protocol during bolus administration of intravenous contrast. Multiplanar CT image reconstructions and MIPs were obtained to evaluate the vascular anatomy. Multidetector CT imaging of the  abdomen and pelvis was performed using the standard protocol during bolus administration of intravenous contrast. RADIATION DOSE REDUCTION: This exam was performed according to the departmental dose-optimization program which includes automated exposure control, adjustment of the mA and/or kV according to patient size and/or use of iterative reconstruction technique. CONTRAST:  OMNIPAQUE IOHEXOL 350 MG/ML SOLN COMPARISON:  06/12/2021 FINDINGS: CTA CHEST FINDINGS Cardiovascular: Apparent filling defects within the right middle and lower lobar pulmonary arteries are artifactual and related to pulmonary vascular redistribution of setting of consolidation. Delayed imaging on CT examination of the abdomen pelvis demonstrates patency of these structures and no intraluminal filling defect identified to suggest pulmonary embolism. The central pulmonary arteries, however,  are enlarged in keeping with changes of pulmonary arterial hypertension. Extensive multi-vessel coronary artery calcification. Extensive calcification of the aortic valve leaflets. Mild to moderate global cardiomegaly with left ventricular dilation. Left subclavian pacemaker in place with leads within the right atrium, right ventricle, and left ventricular venous outflow. No pericardial effusion. Moderate atherosclerotic calcification within the thoracic aorta. No aortic aneurysm. Mediastinum/Nodes: Visualized thyroid is unremarkable. No pathologic thoracic adenopathy. Esophagus unremarkable. Lungs/Pleura: There is right-sided volume loss with elevation of the right hemidiaphragm and right middle and lower lobar collapse and consolidation. Superimposed extensive airway impaction within the right lower lobe and, to a lesser extent, right middle lobe and peribronchial nodularity is seen which may relate to superimposed changes aspiration or acute infection. Trace right pleural effusion. No pneumothorax. Musculoskeletal: No acute bone abnormality. No lytic or blastic bone lesion. Review of the MIP images confirms the above findings. CT ABDOMEN and PELVIS FINDINGS Hepatobiliary: Cholelithiasis without pericholecystic inflammatory changes identified. Liver unremarkable. No intra or extrahepatic biliary ductal dilation. Pancreas: Unremarkable Spleen: Unremarkable Adrenals/Urinary Tract: The adrenal glands are unremarkable. The kidneys are mildly atrophic. Multiple simple cortical and parapelvic cysts are seen within the kidneys bilaterally for which no follow-up imaging is recommended. The kidneys are otherwise unremarkable. The bladder contains a 15 mm dependently layering calculus. The bladder is not distended. Stomach/Bowel: The stomach, small bowel, and large bowel are unremarkable. The appendix is absent. No free intraperitoneal gas or fluid. Vascular/Lymphatic: Extensive aortoiliac atherosclerotic calcification.  Extensive visceral arteriosclerosis. No aortic aneurysm. No pathologic adenopathy within the abdomen and pelvis. Reproductive: Prostate is unremarkable. Other: No abdominal wall hernia. Diffuse subcutaneous body wall edema is present in keeping with changes of mild anasarca. Musculoskeletal: Osseous structures are age-appropriate. Diffuse osteopenia. No acute bone abnormality. No lytic or blastic bone lesion. Review of the MIP images confirms the above findings. IMPRESSION: 1. No pulmonary embolism. 2. Right-sided volume loss with elevation of the right hemidiaphragm and right middle and lower lobar collapse and consolidation. Superimposed extensive airway impaction within the right lower lobe and, to a lesser extent, right middle lobe and peribronchial nodularity which may relate to superimposed changes aspiration or acute infection. Trace right pleural effusion. 3. Mild to moderate global cardiomegaly with left ventricular dilation. Extensive multi-vessel coronary artery calcification. Morphologic changes in keeping with pulmonary arterial hypertension. 4. Cholelithiasis. 5. 15 mm dependently layering calculus within the bladder. 6. Mild anasarca. Aortic Atherosclerosis (ICD10-I70.0). Electronically Signed   By: Helyn Numbers M.D.   On: 11/14/2022 22:15   CT Head Wo Contrast  Result Date: 12/04/2022 CLINICAL DATA:  Multiple falls EXAM: CT HEAD WITHOUT CONTRAST CT CERVICAL SPINE WITHOUT CONTRAST TECHNIQUE: Multidetector CT imaging of the head and cervical spine was performed following the standard protocol without intravenous contrast. Multiplanar CT image reconstructions of the cervical spine were  also generated. RADIATION DOSE REDUCTION: This exam was performed according to the departmental dose-optimization program which includes automated exposure control, adjustment of the mA and/or kV according to patient size and/or use of iterative reconstruction technique. COMPARISON:  05/16/2022 CT head, no prior CT  cervical spine. FINDINGS: CT HEAD FINDINGS Brain: No evidence of acute infarct, hemorrhage, mass, mass effect, or midline shift. No hydrocephalus or extra-axial fluid collection. Age related cerebral atrophy. Vascular: No hyperdense vessel. Atherosclerotic calcifications in the intracranial carotid and vertebral arteries. Skull: Negative for fracture or focal lesion. Sinuses/Orbits: No acute finding. Chronic left lamina papyracea fracture. Other: The mastoid air cells are well aerated. CT CERVICAL SPINE FINDINGS Alignment: No traumatic listhesis. Trace anterolisthesis of to C5 on C6 and C7 on T1, which appears facet mediated. Dextrocurvature of the cervical spine may be positional. Skull base and vertebrae: No acute fracture or suspicious osseous lesion. Soft tissues and spinal canal: No prevertebral fluid or swelling. No visible canal hematoma. Disc levels: Degenerative changes in the cervical spine.No high-grade spinal canal stenosis. Upper chest: No focal pulmonary opacity or pleural effusion. IMPRESSION: 1. No acute intracranial process. 2. No acute fracture or traumatic listhesis in the cervical spine. Electronically Signed   By: Wiliam Ke M.D.   On: 11/12/2022 19:51   CT CERVICAL SPINE WO CONTRAST  Result Date: 11/19/2022 CLINICAL DATA:  Multiple falls EXAM: CT HEAD WITHOUT CONTRAST CT CERVICAL SPINE WITHOUT CONTRAST TECHNIQUE: Multidetector CT imaging of the head and cervical spine was performed following the standard protocol without intravenous contrast. Multiplanar CT image reconstructions of the cervical spine were also generated. RADIATION DOSE REDUCTION: This exam was performed according to the departmental dose-optimization program which includes automated exposure control, adjustment of the mA and/or kV according to patient size and/or use of iterative reconstruction technique. COMPARISON:  05/16/2022 CT head, no prior CT cervical spine. FINDINGS: CT HEAD FINDINGS Brain: No evidence of acute  infarct, hemorrhage, mass, mass effect, or midline shift. No hydrocephalus or extra-axial fluid collection. Age related cerebral atrophy. Vascular: No hyperdense vessel. Atherosclerotic calcifications in the intracranial carotid and vertebral arteries. Skull: Negative for fracture or focal lesion. Sinuses/Orbits: No acute finding. Chronic left lamina papyracea fracture. Other: The mastoid air cells are well aerated. CT CERVICAL SPINE FINDINGS Alignment: No traumatic listhesis. Trace anterolisthesis of to C5 on C6 and C7 on T1, which appears facet mediated. Dextrocurvature of the cervical spine may be positional. Skull base and vertebrae: No acute fracture or suspicious osseous lesion. Soft tissues and spinal canal: No prevertebral fluid or swelling. No visible canal hematoma. Disc levels: Degenerative changes in the cervical spine.No high-grade spinal canal stenosis. Upper chest: No focal pulmonary opacity or pleural effusion. IMPRESSION: 1. No acute intracranial process. 2. No acute fracture or traumatic listhesis in the cervical spine. Electronically Signed   By: Wiliam Ke M.D.   On: 12/06/2022 19:51    ECHO pending  TELEMETRY reviewed by me 11/18/2022: ventricular paced, rate variable 90-100s.   EKG reviewed by me: sinus rhythm at rate 67 bpm   Data reviewed by me 11/18/2022: last 24h vitals tele labs imaging I/O ED provider note, admission H&P.  Active Problems:   Automatic implantable cardioverter-defibrillator in situ   Protein calorie malnutrition (HCC)   Acute on chronic systolic CHF (congestive heart failure) (HCC)   Acute renal failure superimposed on stage 3a chronic kidney disease (HCC)   Cholelithiasis   Thrombocytopenia (HCC)   CAD (coronary artery disease)   Paroxysmal atrial fibrillation (HCC)  Physical deconditioning   Hypotension   Aspiration pneumonia (HCC)   Rhabdomyolysis   NSTEMI (non-ST elevated myocardial infarction) (HCC)   Frequent falls   Frailty   Abnormal  LFTs   Chronic anticoagulation    ASSESSMENT AND PLAN:  Spencer [facility] is a 89 y.o. 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 Cardiology was consulted for further evaluation.    # Acute on chronic HFrEF (EF 20-25% - 05/2022) # Non-ischemic Cardiomyopathy  # S/p AICD (Ventricular Tachycardia s/p ICD shock, Bi V ICD (08/09/2007) # Demand ischemia # HX Paroxsymal Atrial fibrillation  Patient came arrived at ED after multiple falls at home. Troponin 476 > 356. BNP > 4,500. EKG showed sinus rhythm at rate 67 bpm, ventricular paced. Patient denies shortness of breath or chest pain. Interrogation of ICD demonstrated episodes of AF (chronic), no episodes of VT/VF requiring shocks, evidence of progressive fluid overload via device fluid index.  -Increase IV lasix to 80 mg BID.  -Continue to hold home metoprolol. -Hold home amio due to elevated LFTs -Hold home Eliquis. Continue heparin gtt. Heparin managed per pharmacy.  -Strictly monitor I's & O's, daily weights, UOP -Maintain K > 4, Mg > 2.  -Agree with palliative care consults for GOC discussion.   #Hypertension #Hyperlipidemia Upon admission, patients BP has been soft and slightly tachycardic. Currently, BP and HR are stable  -Hold home metoprolol -Hold home simvastatin 40 mg until LFTs improve.   # AKI on CKD 3a Upon admission, Cr 1.92 with GFR 33. Baseline Cr around 1.5. Cr is improving, now at 1.71. Electrolytes are stable, Na 136, K 4.6. -Monitor electrolytes and renal function with diuresis.  -Manager per primary   # Elevated LFTs   Upon admission, acute elevation of LFTs (AST 1,751, ALT 1,049) with  LFTs normal in 05/2022. RUQ US showed gallstones, gallbladder wall thickening and pericholecystic fluid, hepatic steatoses. LFTs continue to elevated now AST 2,087, ALT 1,433. -Hold home amio and statin due to elevated LFTs -Manager per primary   # Rhabdomyolysis Patient fell at home on night of 11/6. Patient laid on the ground throughout the night until EMS arrived to help patient up. Upon admission, CK 920. Now CK 692. -Manager per primary   # Thrombocytopenia Upon admission, platelets 75. Now at 47.  -Manager per primary    This patient's plan of care was discussed and created with Dr. Melton Alar and she is in agreement.  Signed: Gale Journey, PA-C  11/18/2022, 1:42 PM [facility]

## 2022-11-18 NOTE — Assessment & Plan Note (Signed)
Platelets 75,000.  Was normal a month prior, previously 126,000 On heparin but with monitoring for bleeding Holding Eliquis due to heparin

## 2022-11-18 NOTE — Assessment & Plan Note (Signed)
Suspect due to ATN from chronic low volume state due to poor cardiac output Continue to monitor

## 2022-11-18 NOTE — ED Notes (Signed)
US at bedside

## 2022-11-18 NOTE — Assessment & Plan Note (Addendum)
Cardiomyopathy, EF 20 to 25% 05/2022 Patient clinically fluid overloaded, BNP over 4500, chest CT showing global cardiomegaly, mild anasarca.  LFT elevation Patient with soft blood pressures so unable to tolerate IV Lasix without pressors Add low-dose Lasix and midodrine if needed for BP support-may need milrinone Daily weights with intake and output monitoring Will get updated echo Cardiology consult Close monitoring with low threshold for transfer to stepdown/ICU due to low output heart failure/?cardiogenic shock

## 2022-11-18 NOTE — Progress Notes (Addendum)
Discharge    Progress Note    Spencer Coleman  UJW:[id number] DOB: Aug 19, 1930  DOA: 11/26/2022 PCP: Barbette Reichmann, MD      Brief Narrative:    Medical records reviewed and are as summarized below:  Spencer Coleman is a 89 y.o. 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  He presented to the hospital because of multiple falls. Patient fell the night prior to admission and was unable to get up spending all night on the floor.  EMS assisted him up the following morning but he fell again in the afternoon and was brought in by EMS for evaluation.    On arrival BP 99/51 with mild tachypnea to the low 20s and otherwise normal vitals. Labs: Troponin 476--356, with BNP> 4500 WBC 8900 with lactic acid 1.8.  Hemoglobin 12 VBG with normal pH and pCO2 CK 920, ammonia 11, UA unremarkable BMP with creatinine 1.92 up from baseline of 1.4 a month ago Markedly abnormal hepatic function panel as follows: AST 1751, ALT 1049, alk phos 134, total bili 2.6         Assessment/Plan:   Active Problems:   Acute on chronic combined systolic and diastolic CHF (congestive heart failure) (HCC)   Aspiration pneumonia (HCC)   Abnormal LFTs   Acute renal failure superimposed on stage 3a chronic kidney disease (HCC)   Automatic implantable cardioverter-defibrillator in situ   CAD (coronary artery disease)   Chronic anticoagulation   Cholelithiasis   Frequent falls   NSTEMI (non-ST elevated myocardial infarction) (HCC)   Paroxysmal atrial fibrillation (HCC)   Rhabdomyolysis   Thrombocytopenia (HCC)   Protein calorie malnutrition (HCC)   Physical deconditioning   Hypotension   Frailty     Body mass  index is 21.38 kg/m.  (Underweight)   Acute on chronic systolic and diastolic CHF (congestive heart failure) (HCC) Cardiomyopathy, EF 20 to 25%, grade 2 diastolic dysfunction in 05/2022 Patient clinically fluid overloaded, BNP over 4500, chest CT showing global cardiomegaly, mild anasarca.  LFT elevation Continue IV Lasix.  Monitor BP, daily weight, urine output and BMP. 2D echo is pending. Follow-up follow-up with cardiologist.    Elevated liver enzymes Liver enzymes are trending upward.  Differential diagnosis include CHF exacerbation or shock liver. Liver ultrasound showed hepatic steatosis, gallstones, gallbladder wall thickening and pericholecystic fluid concerning for acute cholecystitis. Consulted Dr. Aleen Campi, general surgeon, to assist with management.  He said patient will not be a candidate for surgery. He recommended GI and IR consult.  I have consulted Dr. Mia Creek (GI) and Dr. Juliette Alcide (IR)    Aspiration pneumonia [facility]) Chest CT showing right middle and lower lobe collapse and consolidation Continue vancomycin and cefepime Speech therapist recommended dysphagia 3 diet   Demand ischemia, elevated troponins No chest pain.  Type I NSTEMI has been ruled out.  Metoprolol on hold. Continue IV heparin drip per cardiologist. Follow-up with cardiologist.    Rhabdomyolysis Frequent falls/frailty/physical deconditioning CK is trending down. Patient lives independently and ambulates with walker PT and OT evaluation.  Follow-up with TOC    Paroxysmal atrial fibrillation (HCC) History of ventricular tachycardia s/p AICD Chronic anticoagulation Amiodarone discontinued because of elevated liver enzymes: Eliquis on hold.  He is on IV heparin  Acute renal failure superimposed on stage 3a chronic kidney disease (HCC) This could be cardiorenal.  Monitor BMP closely   Thrombocytopenia (HCC) Platelet count slowly trending downward.  Continue to monitor.    Prognosis is  guarded. Consult palliative care team for goals of care     Diet Order             DIET DYS 3 Room service appropriate? Yes with Assist; Fluid consistency: Thin  Diet effective now                            Consultants: Cardiologist  Procedures: None    Medications:    furosemide  80 mg Intravenous Q12H   sodium chloride flush  10 mL Intravenous Q12H   Continuous Infusions:  ceFEPime (MAXIPIME) IV     heparin 750 Units/hr (11/18/22 1149)   [START ON 11/19/2022] vancomycin       Anti-infectives (From admission, onward)    Start     Dose/Rate Route Frequency Ordered Stop   11/20/22 0000  vancomycin (VANCOCIN) IVPB 1000 mg/200 mL premix  Status:  Discontinued        1,000 mg 200 mL/hr over 60 Minutes Intravenous Every 48 hours 11/18/22 0229 11/18/22 1511   11/19/22 2200  vancomycin (VANCOREADY) IVPB 1250 mg/250 mL        1,250 mg 166.7 mL/hr over 90 Minutes Intravenous Every 48 hours 11/18/22 1511     11/18/22 2300  ceFEPIme (MAXIPIME) 2 g in sodium chloride 0.9 % 100 mL IVPB        2 g 200 mL/hr over 30 Minutes Intravenous Every 24 hours 11/18/22 0223     11/24/2022 2245  vancomycin (VANCOCIN) IVPB 1000 mg/200 mL premix        1,000 mg 200 mL/hr over 60 Minutes Intravenous  Once 12/02/2022 2232 11/18/22 0123   12/03/2022 2245  ceFEPIme (MAXIPIME) 2 g in sodium chloride 0.9 % 100 mL IVPB        2 g 200 mL/hr over 30 Minutes Intravenous  Once 12/07/2022 2232 12/02/2022 2346              Family Communication/Anticipated D/C date and plan/Code Status   DVT prophylaxis:      Code Status: Limited: Do not attempt resuscitation (DNR) -DNR-LIMITED -Do Not Intubate/DNI   Family Communication: None Disposition Plan: May need discharge to SNF   Status is: Inpatient Remains inpatient appropriate because: CHF exacerbation, pneumonia       Subjective:   Interval events noted.  He said he feels fine.  No chest pain or shortness of breath.  He is  confused and cannot provide an adequate history.  Objective:    Vitals:   11/18/22 0830 11/18/22 0900 11/18/22 1152 11/18/22 1307  BP: 99/72 90/62  102/63  Pulse: 79 (!) 110  71  Resp: (!) 23   16  Temp:  98.1 F (36.7 C) 97.9 F (36.6 C)   TempSrc:  Oral Oral   SpO2: 99% 99%  99%  Weight:      Height:       No data found.   Intake/Output Summary (Last 24 hours) at 11/18/2022 1513 Last data filed at 11/18/2022 0123 Gross per 24 hour  Intake 400 ml  Output --  Net 400 ml   Filed Weights   11/13/2022 1400  Weight: 67.6 kg    Exam:  GEN: NAD SKIN: Multiple bruises on bilateral upper extremities.  Skin tear on left elbow EYES: EOMI, PERRLA ENT: MMM CV: Irregular rate and rhythm, tachycardic, pansystolic, loudest in the mitral area PULM: Bronchial breath sounds bilaterally, bibasilar rales ABD: soft, ND, NT, +BS CNS: AAO x 1 (person), non focal EXT: Significant lateral leg and pedal pitting edema.  No erythema or tenderness        Data Reviewed:   I have personally reviewed following labs and imaging studies:  Labs: Labs show the following:   Basic Metabolic Panel: Recent Labs  Lab 11/28/2022 1404 11/18/22 0454  NA 133* 136  K 4.4 4.6  CL 104 102  CO2 20* 26  GLUCOSE 96 87  BUN 37* 37*  CREATININE 1.92* 1.71*  CALCIUM 8.2* 8.3*  MG  --  2.1   GFR Estimated Creatinine Clearance: 27.5 mL/min (A) (by C-G formula based on SCr of 1.71 mg/dL (H)). Liver Function Tests: Recent Labs  Lab 11/26/2022 1718 11/18/22 0454  AST 1,751* 2,087*  ALT 1,049* 1,433*  ALKPHOS 134* 109  BILITOT 2.6* 1.8*  PROT 5.9* 5.2*  ALBUMIN 3.4* 3.0*   No results for input(s): "LIPASE", "AMYLASE" in the last 168 hours. Recent Labs  Lab 11/23/2022 1844  AMMONIA 11   Coagulation profile Recent Labs  Lab 11/18/22 0215  INR 1.5*    CBC: Recent Labs  Lab 11/24/2022 1404 11/18/22 0454  WBC 8.9 7.7  NEUTROABS 7.8*  --   HGB 12.0* 12.4*  HCT 36.5* 38.8*  MCV 94.1 94.6   PLT 75* 70*   Cardiac Enzymes: Recent Labs  Lab 12/05/2022 1718 11/18/22 0454  CKTOTAL 920* 692*   BNP (last 3 results) No results for input(s): "PROBNP" in the last 8760 hours. CBG: No results for input(s): "GLUCAP" in the last 168 hours. D-Dimer: No results for input(s): "DDIMER" in the last 72 hours. Hgb A1c: No results for input(s): "HGBA1C" in the last 72 hours. Lipid Profile: No results for input(s): "CHOL", "HDL", "LDLCALC", "TRIG", "CHOLHDL", "LDLDIRECT" in the last 72 hours. Thyroid function studies: No results for input(s): "TSH", "T4TOTAL", "T3FREE", "THYROIDAB" in the last 72 hours.  Invalid input(s): "FREET3" Anemia work up: No results for input(s): "VITAMINB12", "FOLATE", "FERRITIN", "TIBC", "IRON", "RETICCTPCT" in the last 72 hours. Sepsis Labs: Recent Labs  Lab 11/18/2022 1404 11/29/2022 2249 11/18/22 0454  WBC 8.9  --  7.7  LATICACIDVEN  --  1.8  --     Microbiology Recent Results (from the past 240 hour(s))  Blood culture (routine x 2)     Status: None (Preliminary result)   Collection Time: 11/28/2022 10:49 PM   Specimen: BLOOD  Result Value Ref Range Status   Specimen Description BLOOD RIGHT FOREARM  Final   Special Requests   Final    BOTTLES DRAWN AEROBIC AND ANAEROBIC Blood Culture adequate volume   Culture   Final    NO GROWTH < 12 HOURS Performed at [facility], [address] [place], [place] [address]    Report Status PENDING  Incomplete  Blood culture (routine x 2)     Status: None (Preliminary result)   Collection Time: 11/14/2022 10:49 PM   Specimen: BLOOD  Result Value Ref Range Status   Specimen Description BLOOD LEFT FOREARM  Final   Special Requests   Final    BOTTLES DRAWN AEROBIC AND ANAEROBIC Blood Culture results may not be optimal due to an inadequate volume of blood received in culture bottles   Culture   Final    NO GROWTH < 12 HOURS Performed  at [facility], [address] [place], [place] [address]     Report Status PENDING  Incomplete    Procedures and diagnostic studies:  US Abdomen Limited RUQ (LIVER/GB)  Result Date: 11/18/2022 CLINICAL DATA:  Abnormal LFTs EXAM: ULTRASOUND ABDOMEN LIMITED RIGHT UPPER QUADRANT COMPARISON:  None Available. FINDINGS: Gallbladder: Gallbladder wall thickening. Gallstones, largest measures 1.2 cm. Pericholecystic fluid. No sonographic Murphy sign noted by sonographer. Common bile duct: Diameter: 2 mm Liver: No focal lesion identified. Increased parenchymal echogenicity. Portal vein is patent on color Doppler imaging with normal direction of blood flow towards the liver. Other: None. IMPRESSION: 1. Gallstones, gallbladder wall thickening and pericholecystic fluid. Although findings are concerning for acute cholecystitis, sonographic Eulah Pont sign is negative and findings could also be secondary to systemic process. Recommend clinical correlation. 2. Hepatic steatosis. Electronically Signed   By: Allegra Lai M.D.   On: 11/18/2022 10:40   CT Angio Chest Pulmonary Embolism (PE) W or WO Contrast  Result Date: 11/23/2022 CLINICAL DATA:  Pulmonary embolism (PE) suspected, high prob RLE swelling, elevated trop, elevated LFTs, AMS, falls; Abdominal pain, acute, nonlocalized elevated LFTs and t bili EXAM: CT ANGIOGRAPHY CHEST CT ABDOMEN AND PELVIS WITH CONTRAST TECHNIQUE: Multidetector CT imaging of the chest was performed using the standard protocol during bolus administration of intravenous contrast. Multiplanar CT image reconstructions and MIPs were obtained to evaluate the vascular anatomy. Multidetector CT imaging of the abdomen and pelvis was performed using the standard protocol during bolus administration of intravenous contrast. RADIATION DOSE REDUCTION: This exam was performed according to the departmental dose-optimization program which includes automated exposure control, adjustment of the mA and/or kV according to patient size and/or use of iterative  reconstruction technique. CONTRAST:  OMNIPAQUE IOHEXOL 350 MG/ML SOLN COMPARISON:  06/12/2021 FINDINGS: CTA CHEST FINDINGS Cardiovascular: Apparent filling defects within the right middle and lower lobar pulmonary arteries are artifactual and related to pulmonary vascular redistribution of setting of consolidation. Delayed imaging on CT examination of the abdomen pelvis demonstrates patency of these structures and no intraluminal filling defect identified to suggest pulmonary embolism. The central pulmonary arteries, however, are enlarged in keeping with changes of pulmonary arterial hypertension. Extensive multi-vessel coronary artery calcification. Extensive calcification of the aortic valve leaflets. Mild to moderate global cardiomegaly with left ventricular dilation. Left subclavian pacemaker in place with leads within the right atrium, right ventricle, and left ventricular venous outflow. No pericardial effusion. Moderate atherosclerotic calcification within the thoracic aorta. No aortic aneurysm. Mediastinum/Nodes: Visualized thyroid is unremarkable. No pathologic thoracic adenopathy. Esophagus unremarkable. Lungs/Pleura: There is right-sided volume loss with elevation of the right hemidiaphragm and right middle and lower lobar collapse and consolidation. Superimposed extensive airway impaction within the right lower lobe and, to a lesser extent, right middle lobe and peribronchial nodularity is seen which may relate to superimposed changes aspiration or acute infection. Trace right pleural effusion. No pneumothorax. Musculoskeletal: No acute bone abnormality. No lytic or blastic bone lesion. Review of the MIP images confirms the above findings. CT ABDOMEN and PELVIS FINDINGS Hepatobiliary: Cholelithiasis without pericholecystic inflammatory changes identified. Liver unremarkable. No intra or extrahepatic biliary ductal dilation. Pancreas: Unremarkable Spleen: Unremarkable Adrenals/Urinary Tract: The  adrenal glands are unremarkable. The kidneys are mildly atrophic. Multiple simple cortical and parapelvic cysts are seen within the kidneys bilaterally for which no follow-up imaging is recommended. The kidneys are otherwise unremarkable. The bladder contains a 15 mm dependently layering calculus. The bladder is not distended. Stomach/Bowel: The stomach, small bowel, and large bowel are  unremarkable. The appendix is absent. No free intraperitoneal gas or fluid. Vascular/Lymphatic: Extensive aortoiliac atherosclerotic calcification. Extensive visceral arteriosclerosis. No aortic aneurysm. No pathologic adenopathy within the abdomen and pelvis. Reproductive: Prostate is unremarkable. Other: No abdominal wall hernia. Diffuse subcutaneous body wall edema is present in keeping with changes of mild anasarca. Musculoskeletal: Osseous structures are age-appropriate. Diffuse osteopenia. No acute bone abnormality. No lytic or blastic bone lesion. Review of the MIP images confirms the above findings. IMPRESSION: 1. No pulmonary embolism. 2. Right-sided volume loss with elevation of the right hemidiaphragm and right middle and lower lobar collapse and consolidation. Superimposed extensive airway impaction within the right lower lobe and, to a lesser extent, right middle lobe and peribronchial nodularity which may relate to superimposed changes aspiration or acute infection. Trace right pleural effusion. 3. Mild to moderate global cardiomegaly with left ventricular dilation. Extensive multi-vessel coronary artery calcification. Morphologic changes in keeping with pulmonary arterial hypertension. 4. Cholelithiasis. 5. 15 mm dependently layering calculus within the bladder. 6. Mild anasarca. Aortic Atherosclerosis (ICD10-I70.0). Electronically Signed   By: Helyn Numbers M.D.   On: 12/05/2022 22:15   CT ABDOMEN PELVIS W CONTRAST  Result Date: 11/18/2022 CLINICAL DATA:  Pulmonary embolism (PE) suspected, high prob RLE swelling,  elevated trop, elevated LFTs, AMS, falls; Abdominal pain, acute, nonlocalized elevated LFTs and t bili EXAM: CT ANGIOGRAPHY CHEST CT ABDOMEN AND PELVIS WITH CONTRAST TECHNIQUE: Multidetector CT imaging of the chest was performed using the standard protocol during bolus administration of intravenous contrast. Multiplanar CT image reconstructions and MIPs were obtained to evaluate the vascular anatomy. Multidetector CT imaging of the abdomen and pelvis was performed using the standard protocol during bolus administration of intravenous contrast. RADIATION DOSE REDUCTION: This exam was performed according to the departmental dose-optimization program which includes automated exposure control, adjustment of the mA and/or kV according to patient size and/or use of iterative reconstruction technique. CONTRAST:  OMNIPAQUE IOHEXOL 350 MG/ML SOLN COMPARISON:  06/12/2021 FINDINGS: CTA CHEST FINDINGS Cardiovascular: Apparent filling defects within the right middle and lower lobar pulmonary arteries are artifactual and related to pulmonary vascular redistribution of setting of consolidation. Delayed imaging on CT examination of the abdomen pelvis demonstrates patency of these structures and no intraluminal filling defect identified to suggest pulmonary embolism. The central pulmonary arteries, however, are enlarged in keeping with changes of pulmonary arterial hypertension. Extensive multi-vessel coronary artery calcification. Extensive calcification of the aortic valve leaflets. Mild to moderate global cardiomegaly with left ventricular dilation. Left subclavian pacemaker in place with leads within the right atrium, right ventricle, and left ventricular venous outflow. No pericardial effusion. Moderate atherosclerotic calcification within the thoracic aorta. No aortic aneurysm. Mediastinum/Nodes: Visualized thyroid is unremarkable. No pathologic thoracic adenopathy. Esophagus unremarkable. Lungs/Pleura: There is  right-sided volume loss with elevation of the right hemidiaphragm and right middle and lower lobar collapse and consolidation. Superimposed extensive airway impaction within the right lower lobe and, to a lesser extent, right middle lobe and peribronchial nodularity is seen which may relate to superimposed changes aspiration or acute infection. Trace right pleural effusion. No pneumothorax. Musculoskeletal: No acute bone abnormality. No lytic or blastic bone lesion. Review of the MIP images confirms the above findings. CT ABDOMEN and PELVIS FINDINGS Hepatobiliary: Cholelithiasis without pericholecystic inflammatory changes identified. Liver unremarkable. No intra or extrahepatic biliary ductal dilation. Pancreas: Unremarkable Spleen: Unremarkable Adrenals/Urinary Tract: The adrenal glands are unremarkable. The kidneys are mildly atrophic. Multiple simple cortical and parapelvic cysts are seen within the kidneys bilaterally for which no follow-up  imaging is recommended. The kidneys are otherwise unremarkable. The bladder contains a 15 mm dependently layering calculus. The bladder is not distended. Stomach/Bowel: The stomach, small bowel, and large bowel are unremarkable. The appendix is absent. No free intraperitoneal gas or fluid. Vascular/Lymphatic: Extensive aortoiliac atherosclerotic calcification. Extensive visceral arteriosclerosis. No aortic aneurysm. No pathologic adenopathy within the abdomen and pelvis. Reproductive: Prostate is unremarkable. Other: No abdominal wall hernia. Diffuse subcutaneous body wall edema is present in keeping with changes of mild anasarca. Musculoskeletal: Osseous structures are age-appropriate. Diffuse osteopenia. No acute bone abnormality. No lytic or blastic bone lesion. Review of the MIP images confirms the above findings. IMPRESSION: 1. No pulmonary embolism. 2. Right-sided volume loss with elevation of the right hemidiaphragm and right middle and lower lobar collapse and  consolidation. Superimposed extensive airway impaction within the right lower lobe and, to a lesser extent, right middle lobe and peribronchial nodularity which may relate to superimposed changes aspiration or acute infection. Trace right pleural effusion. 3. Mild to moderate global cardiomegaly with left ventricular dilation. Extensive multi-vessel coronary artery calcification. Morphologic changes in keeping with pulmonary arterial hypertension. 4. Cholelithiasis. 5. 15 mm dependently layering calculus within the bladder. 6. Mild anasarca. Aortic Atherosclerosis (ICD10-I70.0). Electronically Signed   By: Helyn Numbers M.D.   On: 11/26/2022 22:15   CT Head Wo Contrast  Result Date: 12/06/2022 CLINICAL DATA:  Multiple falls EXAM: CT HEAD WITHOUT CONTRAST CT CERVICAL SPINE WITHOUT CONTRAST TECHNIQUE: Multidetector CT imaging of the head and cervical spine was performed following the standard protocol without intravenous contrast. Multiplanar CT image reconstructions of the cervical spine were also generated. RADIATION DOSE REDUCTION: This exam was performed according to the departmental dose-optimization program which includes automated exposure control, adjustment of the mA and/or kV according to patient size and/or use of iterative reconstruction technique. COMPARISON:  05/16/2022 CT head, no prior CT cervical spine. FINDINGS: CT HEAD FINDINGS Brain: No evidence of acute infarct, hemorrhage, mass, mass effect, or midline shift. No hydrocephalus or extra-axial fluid collection. Age related cerebral atrophy. Vascular: No hyperdense vessel. Atherosclerotic calcifications in the intracranial carotid and vertebral arteries. Skull: Negative for fracture or focal lesion. Sinuses/Orbits: No acute finding. Chronic left lamina papyracea fracture. Other: The mastoid air cells are well aerated. CT CERVICAL SPINE FINDINGS Alignment: No traumatic listhesis. Trace anterolisthesis of to C5 on C6 and C7 on T1, which appears  facet mediated. Dextrocurvature of the cervical spine may be positional. Skull base and vertebrae: No acute fracture or suspicious osseous lesion. Soft tissues and spinal canal: No prevertebral fluid or swelling. No visible canal hematoma. Disc levels: Degenerative changes in the cervical spine.No high-grade spinal canal stenosis. Upper chest: No focal pulmonary opacity or pleural effusion. IMPRESSION: 1. No acute intracranial process. 2. No acute fracture or traumatic listhesis in the cervical spine. Electronically Signed   By: Wiliam Ke M.D.   On: 12/09/2022 19:51   CT CERVICAL SPINE WO CONTRAST  Result Date: 11/22/2022 CLINICAL DATA:  Multiple falls EXAM: CT HEAD WITHOUT CONTRAST CT CERVICAL SPINE WITHOUT CONTRAST TECHNIQUE: Multidetector CT imaging of the head and cervical spine was performed following the standard protocol without intravenous contrast. Multiplanar CT image reconstructions of the cervical spine were also generated. RADIATION DOSE REDUCTION: This exam was performed according to the departmental dose-optimization program which includes automated exposure control, adjustment of the mA and/or kV according to patient size and/or use of iterative reconstruction technique. COMPARISON:  05/16/2022 CT head, no prior CT cervical spine. FINDINGS: CT HEAD FINDINGS Brain:  No evidence of acute infarct, hemorrhage, mass, mass effect, or midline shift. No hydrocephalus or extra-axial fluid collection. Age related cerebral atrophy. Vascular: No hyperdense vessel. Atherosclerotic calcifications in the intracranial carotid and vertebral arteries. Skull: Negative for fracture or focal lesion. Sinuses/Orbits: No acute finding. Chronic left lamina papyracea fracture. Other: The mastoid air cells are well aerated. CT CERVICAL SPINE FINDINGS Alignment: No traumatic listhesis. Trace anterolisthesis of to C5 on C6 and C7 on T1, which appears facet mediated. Dextrocurvature of the cervical spine may be positional.  Skull base and vertebrae: No acute fracture or suspicious osseous lesion. Soft tissues and spinal canal: No prevertebral fluid or swelling. No visible canal hematoma. Disc levels: Degenerative changes in the cervical spine.No high-grade spinal canal stenosis. Upper chest: No focal pulmonary opacity or pleural effusion. IMPRESSION: 1. No acute intracranial process. 2. No acute fracture or traumatic listhesis in the cervical spine. Electronically Signed   By: Wiliam Ke M.D.   On: 11/24/2022 19:51               LOS: 1 day   Mannat Benedetti  [facility]   Pager on [email or web address]. If 7PM-7AM, please contact night-coverage at [email or web address]     11/18/2022, 3:13 PM

## 2022-11-19 ENCOUNTER — Other Ambulatory Visit: Payer: Self-pay

## 2022-11-19 DIAGNOSIS — Z7189 Other specified counseling: Secondary | ICD-10-CM | POA: Diagnosis not present

## 2022-11-19 DIAGNOSIS — I5043 Acute on chronic combined systolic (congestive) and diastolic (congestive) heart failure: Secondary | ICD-10-CM

## 2022-11-19 LAB — COMPREHENSIVE METABOLIC PANEL
ALT: 1148 U/L — ABNORMAL HIGH (ref 0–44)
AST: 887 U/L — ABNORMAL HIGH (ref 15–41)
Albumin: 2.8 g/dL — ABNORMAL LOW (ref 3.5–5.0)
Alkaline Phosphatase: 105 U/L (ref 38–126)
Anion gap: 10 (ref 5–15)
BUN: 38 mg/dL — ABNORMAL HIGH (ref 8–23)
CO2: 25 mmol/L (ref 22–32)
Calcium: 8.2 mg/dL — ABNORMAL LOW (ref 8.9–10.3)
Chloride: 98 mmol/L (ref 98–111)
Creatinine, Ser: 1.92 mg/dL — ABNORMAL HIGH (ref 0.61–1.24)
GFR, Estimated: 33 mL/min — ABNORMAL LOW (ref >60)
Glucose, Bld: 114 mg/dL — ABNORMAL HIGH (ref 70–99)
Potassium: 3.9 mmol/L (ref 3.5–5.1)
Sodium: 133 mmol/L — ABNORMAL LOW (ref 135–145)
Total Bilirubin: 1.6 mg/dL — ABNORMAL HIGH (ref <1.2)
Total Protein: 5.1 g/dL — ABNORMAL LOW (ref 6.5–8.1)

## 2022-11-19 LAB — HEPATITIS PANEL, ACUTE
HCV Ab: NONREACTIVE
Hep A IgM: NONREACTIVE
Hep B C IgM: NONREACTIVE
Hepatitis B Surface Ag: NONREACTIVE

## 2022-11-19 LAB — CBC
HCT: 41.5 % (ref 39.0–52.0)
Hemoglobin: 13.6 g/dL (ref 13.0–17.0)
MCH: 30.4 pg (ref 26.0–34.0)
MCHC: 32.8 g/dL (ref 30.0–36.0)
MCV: 92.8 fL (ref 80.0–100.0)
Platelets: 96 10*3/uL — ABNORMAL LOW (ref 150–400)
RBC: 4.47 MIL/uL (ref 4.22–5.81)
RDW: 14.6 % (ref 11.5–15.5)
WBC: 6.6 10*3/uL (ref 4.0–10.5)
nRBC: 0 % (ref 0.0–0.2)

## 2022-11-19 LAB — HEPARIN LEVEL (UNFRACTIONATED)
Heparin Unfractionated: 0.22 [IU]/mL — ABNORMAL LOW (ref 0.30–0.70)
Heparin Unfractionated: 0.34 [IU]/mL (ref 0.30–0.70)

## 2022-11-19 MED ORDER — DIGOXIN 0.25 MG/ML IJ SOLN
0.1250 mg | Freq: Once | INTRAMUSCULAR | Status: AC
  Administered 2022-11-19: 0.125 mg via INTRAVENOUS
  Filled 2022-11-19: qty 2

## 2022-11-19 MED ORDER — DIAZEPAM 5 MG/ML IJ SOLN
INTRAMUSCULAR | Status: AC
  Administered 2022-11-19: 2.5 mg via INTRAVENOUS
  Filled 2022-11-19: qty 2

## 2022-11-19 MED ORDER — HEPARIN BOLUS VIA INFUSION
700.0000 [IU] | Freq: Once | INTRAVENOUS | Status: AC
Start: 2022-11-19 — End: 2022-11-19
  Administered 2022-11-19: 700 [IU] via INTRAVENOUS
  Filled 2022-11-19: qty 700

## 2022-11-19 MED ORDER — DIAZEPAM 5 MG/ML IJ SOLN: 2.5000 mg | Freq: Once | INTRAMUSCULAR | Status: AC

## 2022-11-19 NOTE — Progress Notes (Addendum)
Daily Progress Note   Patient Name: Spencer Coleman       Date: 11/19/2022 DOB: 14-Sep-1930  Age: 89 y.o. MRN#: [id number] Attending Physician: Lurene Shadow, MD Primary Care Physician: Barbette Reichmann, MD Admit Date: 12/08/2022  Reason for Consultation/Follow-up: Establishing goals of care  Subjective: Notes and labs reviewed.  Into see patient.  He is currently confused.  He is able to tell me his age and that he is divorced.  He states he has 3 children, but has difficulty with stating their names.  He advises that he sees his daughter [name] regularly and she helps him.  He states she would be his surrogate decision maker.  He tells me the year is 2025 and is unable to tell me who the president is.  He is unable to tell me that we are in the hospital.  Patient's brother is at bedside who states the information regarding [name] is not true, and that their sister Drema Balzarine is the person who goes to help him with his daily needs.  He advises that the patient does not take his medications the way that he should and has poor p.o. intake.  He discusses that the patient goes to adult daycare.  Patient's brother advises that they have been made aware the patient needs hospice level care.  Patient's brother discusses that they have spoken with DSS and have spoken with judges and attorneys regarding their concerns for "neglect" as they are concerned about his welfare, and daughters' willingness/ability to provide the care that he needs.  Communicated with attending via epic chat and he advises that he is currently working with the family regarding plans moving forward.  PMT will shadow at this time.  Length of Stay: 2  Current Medications: Scheduled Meds:   furosemide  80 mg Intravenous Q12H   sodium  chloride flush  10 mL Intravenous Q12H    Continuous Infusions:  ceFEPime (MAXIPIME) IV Stopped (11/19/22 0050)   vancomycin      PRN Meds: acetaminophen **OR** acetaminophen, ondansetron **OR** ondansetron (ZOFRAN) IV  Physical Exam Pulmonary:     Effort: Pulmonary effort is normal.  Neurological:     Mental Status: He is alert.             Vital Signs: BP 97/81 (BP Location: Left Arm)  Pulse (!) 117   Temp (!) 97.4 F (36.3 C)   Resp 16   Ht 5\' 10"  (1.778 m)   Wt 67.6 kg   SpO2 100%   BMI 21.38 kg/m  SpO2: SpO2: 100 % O2 Device: O2 Device: Nasal Cannula O2 Flow Rate: O2 Flow Rate (L/min): 2 L/min  Intake/output summary:  Intake/Output Summary (Last 24 hours) at 11/19/2022 1614 Last data filed at 11/19/2022 7829 Gross per 24 hour  Intake 100.7 ml  Output 25 ml  Net 75.7 ml   LBM:   Baseline Weight: Weight: 67.6 kg Most recent weight: Weight: 67.6 kg   Patient Active Problem List   Diagnosis Date Noted   Elevated LFTs 11/18/2022   Chronic anticoagulation 11/18/2022   CHF exacerbation (HCC) 11/28/2022   Aspiration pneumonia (HCC) 12/02/2022   Rhabdomyolysis 11/22/2022   NSTEMI (non-ST elevated myocardial infarction) (HCC) 11/12/2022   Frequent falls 11/16/2022   Frailty 11/12/2022   Hypotension 05/21/2022   Acute hypoxic respiratory failure (HCC) 05/12/2022   Physical deconditioning 06/13/2021   Acute on chronic combined systolic and diastolic CHF (congestive heart failure) (HCC) 06/12/2021   Acute renal failure superimposed on stage 3a chronic kidney disease (HCC) 06/12/2021   Cholelithiasis 06/12/2021   Elevated troponin 06/12/2021   Thrombocytopenia (HCC) 06/12/2021   CAD (coronary artery disease) 06/12/2021   Delirium 06/12/2021   Paroxysmal atrial fibrillation (HCC) 06/12/2021   Hx subclinical atrial fibrillation on ICD interrogation, concern for Afib/RVR  01/06/2021   CKD (chronic kidney disease) stage 3, GFR 30-59 ml/min (HCC) 01/06/2021   BPH  with obstruction/lower urinary tract symptoms 02/21/2018   Closed displaced fracture of coronoid process of left ulna 06/28/2016   Protein calorie malnutrition (HCC) 02/08/2015   Calculus of kidney 01/09/2015   History of cardiac catheterization 06/06/2013   Essential hypertension 06/06/2013   HLD (hyperlipidemia) 06/06/2013   ACE inhibitor intolerance 06/06/2013   Ventricular tachycardia 04/05/2013   SVT (supraventricular tachycardia) (HCC) 04/05/2013   Automatic implantable cardioverter-defibrillator in situ 03/25/2013   Calculus in bladder 08/07/2012   Gross hematuria 08/07/2012   Incomplete emptying of bladder 08/07/2012   Acute cystitis 10/07/2011   Benign localized prostatic hyperplasia with lower urinary tract symptoms (LUTS) 10/07/2011    Palliative Care Assessment & Plan     Recommendations/Plan:  Attending team is working closely with family regarding plans moving forward.   If family opts for hospice care, would recommend reaching out to hospice liaison of their choice.    TOC and attending made aware of patient's brother's concerns.  PMT will shadow at this time.  Code Status:    Code Status Orders  (From admission, onward)           Start     Ordered   11/18/22 0134  Do not attempt resuscitation (DNR)- Limited -Do Not Intubate (DNI)  Continuous       Question Answer Comment  If pulseless and not breathing No CPR or chest compressions.   In Pre-Arrest Conditions (Patient Is Breathing and Has A Pulse) Do not intubate. Provide all appropriate non-invasive medical interventions. Avoid ICU transfer unless indicated or required.   Consent: Discussion documented in EHR or advanced directives reviewed      11/18/22 0134           Code Status History     Date Active Date Inactive Code Status Order ID Comments User Context   05/18/2022 1559 05/31/2022 2303 DNR [id number]  Georgiann Cocker, FNP Inpatient   05/12/2022 1553 05/18/2022  1559 Full Code [id number]  Ezequiel Essex, NP ED   06/12/2021 1012 06/14/2021 1924 DNR [id number]  Lorretta Harp, MD ED   06/12/2021 0915 06/12/2021 1012 Full Code [id number]  Lorretta Harp, MD ED   01/28/2021 1056 01/28/2021 1834 Full Code [id number]  Marcina Millard, MD Inpatient   01/28/2021 1055 01/28/2021 1056 Full Code [id number]  Marcina Millard, MD Inpatient   01/06/2021 1942 01/07/2021 2051 Full Code [id number]  Sunnie Nielsen, DO ED   02/21/2018 1629 02/22/2018 1713 Full Code [id number]  Sondra Come, MD Inpatient   02/06/2015 1923 02/08/2015 1842 Full Code [id number]  Donato Heinz, MD Inpatient   01/10/2015 1503 01/14/2015 1821 Full Code [id number]  Donato Heinz, MD Inpatient   01/09/2015 2052 01/10/2015 1503 Full Code [id number]  Enid Baas, MD ED   01/09/2015 2007 01/09/2015 2052 Full Code [id number]  Hooten, Illene Labrador, MD ED       Care plan was discussed with tending and TOC via epic chat  Thank you for allowing the Palliative Medicine Team to assist in the care of this patient.     Morton Stall, NP  Please contact Palliative Medicine Team phone at [phone] for questions and concerns.

## 2022-11-19 NOTE — Progress Notes (Signed)
Discharge    Progress Note    Spencer Coleman  ZOX:[id number] DOB: October 28, 1930  DOA: 11/13/2022 PCP: Barbette Reichmann, MD      Brief Narrative:    Medical records reviewed and are as summarized below:  Spencer Coleman is a 89 y.o. 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  He presented to the hospital because of multiple falls. Patient fell the night prior to admission and was unable to get up spending all night on the floor.  EMS assisted him up the following morning but he fell again in the afternoon and was brought in by EMS for evaluation.    On arrival BP 99/51 with mild tachypnea to the low 20s and otherwise normal vitals. Labs: Troponin 476--356, with BNP> 4500 WBC 8900 with lactic acid 1.8.  Hemoglobin 12 VBG with normal pH and pCO2 CK 920, ammonia 11, UA unremarkable BMP with creatinine 1.92 up from baseline of 1.4 a month ago Markedly abnormal hepatic function panel as follows: AST 1751, ALT 1049, alk phos 134, total bili 2.6         Assessment/Plan:   Active Problems:   Acute on chronic combined systolic and diastolic CHF (congestive heart failure) (HCC)   Aspiration pneumonia (HCC)   Elevated LFTs   Acute renal failure superimposed on stage 3a chronic kidney disease (HCC)   Automatic implantable cardioverter-defibrillator in situ   CAD (coronary artery disease)   Chronic anticoagulation   Cholelithiasis   Frequent falls   NSTEMI (non-ST elevated myocardial infarction) (HCC)   Paroxysmal atrial fibrillation (HCC)   Rhabdomyolysis   Thrombocytopenia (HCC)   Protein calorie malnutrition (HCC)   Physical deconditioning   Hypotension   Frailty     Body mass  index is 21.38 kg/m.  (Underweight)   Acute on chronic systolic and diastolic CHF (congestive heart failure) (HCC) Cardiomyopathy, EF 20 to 25%, grade 2 diastolic dysfunction in 05/2022 Patient clinically fluid overloaded, BNP over 4500, chest CT showing global cardiomegaly, mild anasarca.  LFT elevation Continue IV Lasix.  Monitor BP, daily weight, urine output and BMP. 2D echo on 11/18/2022 showed estimated EF 20 to 25%, indeterminate LV diastolic parameters.  EF is unchanged from prior echo in May 2024. Follow-up follow-up with cardiologist.    Elevated liver enzymes Liver enzymes are starting to trend downward.  Shock liver/ischemic hepatopathy suspected. Liver ultrasound showed hepatic steatosis, gallstones, gallbladder wall thickening and pericholecystic fluid concerning for acute cholecystitis. Patient was evaluated by general surgeon.  Acute cholecystitis is not suspected at this time.  Patient is asymptomatic..    Aspiration pneumonia (HCC) Chest CT showing right middle and lower lobe collapse and consolidation Continue empiric IV antibiotics. Speech therapist recommended dysphagia 3 diet   Demand ischemia, elevated troponins No chest pain.  Type I NSTEMI has been ruled out.  Metoprolol on hold. Discontinue IV heparin drip.  Discussed with Dr. Melton Alar, cardiologist.    Rhabdomyolysis Frequent falls/frailty/physical deconditioning CK is trending down. Patient lives independently and ambulates with walker PT and OT evaluation.  Follow-up with TOC    Paroxysmal atrial fibrillation (HCC) with RVR History of ventricular tachycardia s/p AICD Chronic anticoagulation Amiodarone discontinued because of elevated liver enzymes: IV digoxin 0.125 mg x 1 dose  was given. Discontinue IV heparin drip. [name], daughter, said not to restart Eliquis.    Acute renal failure superimposed on stage 3a chronic kidney disease (HCC) This could be cardiorenal.  Monitor BMP  closely   Thrombocytopenia (HCC) Platelet count is starting to go up.  Continue to monitor.   Delirium with agitation, underlying cognitive impairment/dementia Start low-dose Seroquel at night.  Psychotropics as needed for agitation    Prognosis is guarded.  Plan of care was discussed with [name], daughter and healthcare power of attorney.  Goals of care were discussed as well.  She said no to continue amiodarone, statins and Eliquis.  She said she had tried to get patient's cardiologist to take him off these medicines a few months ago. Follow-up with palliative care team.   Diet Order             DIET DYS 3 Room service appropriate? Yes with Assist; Fluid consistency: Thin  Diet effective now                            Consultants: Cardiologist  Procedures: None    Medications:    furosemide  80 mg Intravenous Q12H   sodium chloride flush  10 mL Intravenous Q12H   Continuous Infusions:  ceFEPime (MAXIPIME) IV Stopped (11/19/22 0050)   heparin 950 Units/hr (11/19/22 1150)   vancomycin       Anti-infectives (From admission, onward)    Start     Dose/Rate Route Frequency Ordered Stop   11/20/22 0000  vancomycin (VANCOCIN) IVPB 1000 mg/200 mL premix  Status:  Discontinued        1,000 mg 200 mL/hr over 60 Minutes Intravenous Every 48 hours 11/18/22 0229 11/18/22 1511   11/19/22 2200  vancomycin (VANCOREADY) IVPB 1250 mg/250 mL        1,250 mg 166.7 mL/hr over 90 Minutes Intravenous Every 48 hours 11/18/22 1511     11/18/22 2300  ceFEPIme (MAXIPIME) 2 g in sodium chloride 0.9 % 100 mL IVPB        2 g 200 mL/hr over 30 Minutes Intravenous Every 24 hours 11/18/22 0223     12/01/2022 2245  vancomycin (VANCOCIN) IVPB 1000 mg/200 mL premix        1,000 mg 200 mL/hr over 60 Minutes Intravenous  Once 11/27/2022 2232 11/18/22 0123   11/16/2022 2245  ceFEPIme (MAXIPIME) 2 g in sodium chloride 0.9 % 100 mL IVPB        2 g 200 mL/hr over 30 Minutes Intravenous   Once 11/15/2022 2232 11/22/2022 2346              Family Communication/Anticipated D/C date and plan/Code Status   DVT prophylaxis:      Code Status: Limited: Do not attempt resuscitation (DNR) -DNR-LIMITED -Do Not Intubate/DNI   Family Communication: None Disposition Plan: May need discharge to SNF   Status is: Inpatient Remains inpatient appropriate because: CHF exacerbation, pneumonia       Subjective:   Interval events noted.  Patient was agitated and combative overnight.  He is confused and cannot provide any history.  Objective:    Vitals:   11/19/22 1300 11/19/22 1330 11/19/22 1336 11/19/22 1358  BP: (!) 83/59 93/67  97/81  Pulse: (!) 120 (!) 116  (!) 117  Resp: 20 (!) 23  16  Temp:   (!) 97.5 F (36.4 C) (!) 97.4 F (36.3 C)  TempSrc:   Axillary   SpO2:  100% 100%  100%  Weight:      Height:       No data found.   Intake/Output Summary (Last 24 hours) at 11/19/2022 1408 Last data filed at 11/19/2022 0228 Gross per 24 hour  Intake 100.7 ml  Output 25 ml  Net 75.7 ml   Filed Weights   11/13/2022 1400  Weight: 67.6 kg    Exam:  GEN: NAD SKIN: Multiple bruises on bilateral upper extremities.  Skin tear on the left elbow. EYES: No pallor or icterus ENT: MMM CV: Irregular rate and rhythm, tachycardic PULM: Bibasilar rales ABD: soft, ND, NT, +BS CNS: Drowsy but easily aroused.  Confused. EXT: Bilateral leg and pedal edema      Data Reviewed:   I have personally reviewed following labs and imaging studies:  Labs: Labs show the following:   Basic Metabolic Panel: Recent Labs  Lab 11/30/2022 1404 11/18/22 0454 11/19/22 0623  NA 133* 136 133*  K 4.4 4.6 3.9  CL 104 102 98  CO2 20* 26 25  GLUCOSE 96 87 114*  BUN 37* 37* 38*  CREATININE 1.92* 1.71* 1.92*  CALCIUM 8.2* 8.3* 8.2*  MG  --  2.1  --    GFR Estimated Creatinine Clearance: 24.5 mL/min (A) (by C-G formula based on SCr of 1.92 mg/dL (H)). Liver Function Tests: Recent  Labs  Lab 11/12/2022 1718 11/18/22 0454 11/19/22 0623  AST 1,751* 2,087* 887*  ALT 1,049* 1,433* 1,148*  ALKPHOS 134* 109 105  BILITOT 2.6* 1.8* 1.6*  PROT 5.9* 5.2* 5.1*  ALBUMIN 3.4* 3.0* 2.8*   No results for input(s): "LIPASE", "AMYLASE" in the last 168 hours. Recent Labs  Lab 11/30/2022 1844  AMMONIA 11   Coagulation profile Recent Labs  Lab 11/18/22 0215  INR 1.5*    CBC: Recent Labs  Lab 12/02/2022 1404 11/18/22 0454 11/19/22 0623  WBC 8.9 7.7 6.6  NEUTROABS 7.8*  --   --   HGB 12.0* 12.4* 13.6  HCT 36.5* 38.8* 41.5  MCV 94.1 94.6 92.8  PLT 75* 70* 96*   Cardiac Enzymes: Recent Labs  Lab 12/01/2022 1718 11/18/22 0454  CKTOTAL 920* 692*   BNP (last 3 results) No results for input(s): "PROBNP" in the last 8760 hours. CBG: No results for input(s): "GLUCAP" in the last 168 hours. D-Dimer: No results for input(s): "DDIMER" in the last 72 hours. Hgb A1c: No results for input(s): "HGBA1C" in the last 72 hours. Lipid Profile: No results for input(s): "CHOL", "HDL", "LDLCALC", "TRIG", "CHOLHDL", "LDLDIRECT" in the last 72 hours. Thyroid function studies: No results for input(s): "TSH", "T4TOTAL", "T3FREE", "THYROIDAB" in the last 72 hours.  Invalid input(s): "FREET3" Anemia work up: No results for input(s): "VITAMINB12", "FOLATE", "FERRITIN", "TIBC", "IRON", "RETICCTPCT" in the last 72 hours. Sepsis Labs: Recent Labs  Lab 11/23/2022 1404 12/10/2022 2249 11/18/22 0454 11/19/22 0623  WBC 8.9  --  7.7 6.6  LATICACIDVEN  --  1.8  --   --     Microbiology Recent Results (from the past 240 hour(s))  Blood culture (routine x 2)     Status: None (Preliminary result)   Collection Time: 11/23/2022 10:49 PM   Specimen: BLOOD  Result Value Ref Range Status   Specimen Description BLOOD RIGHT FOREARM  Final   Special Requests   Final    BOTTLES DRAWN AEROBIC AND ANAEROBIC Blood Culture adequate volume   Culture   Final    NO GROWTH 2 DAYS Performed at [facility], [address] [place], [place] [address]    Report Status PENDING  Incomplete  Blood culture (routine x 2)     Status: None (Preliminary result)   Collection Time: 11/14/2022 10:49 PM   Specimen: BLOOD  Result Value Ref Range Status   Specimen Description BLOOD LEFT FOREARM  Final   Special Requests   Final    BOTTLES DRAWN AEROBIC AND ANAEROBIC Blood Culture results may not be optimal due to an inadequate volume of blood received in culture bottles   Culture   Final    NO GROWTH 2 DAYS Performed at [facility], [address] [place], [place] [address]    Report Status PENDING  Incomplete    Procedures and diagnostic studies:  ECHOCARDIOGRAM COMPLETE  Result Date: 11/18/2022    ECHOCARDIOGRAM REPORT   Patient Name:   Spencer Coleman Date of Exam: 11/18/2022 Medical Rec #:  [id number]    Height:       70.0 in Accession #:    [id number]   Weight:       103.0 lb Date of Birth:  [birth year]     BSA:          1.574 m Patient Age:    89 years     BP:           101/68 mmHg Patient Gender: M            HR:           82 bpm. Exam Location:  [facility] Procedure: 2D Echo, Cardiac Doppler and Color Doppler Indications:     CHF  History:         Patient has prior history of Echocardiogram examinations, most                  recent 05/13/2022. CHF, CAD, Pacemaker and Defibrillator,                  Arrythmias:Atrial Fibrillation and Tachycardia; Risk                  Factors:Hypertension and Dyslipidemia. NSTEMI, CKD.  Sonographer:     Mikki Harbor Referring Phys:  [id number] Andris Baumann Diagnosing Phys: Debbe Odea MD IMPRESSIONS  1. Left ventricular ejection fraction, by estimation, is 20 to 25%. The left ventricle has severely decreased function. The left ventricle demonstrates global hypokinesis. The left ventricular internal cavity size was severely dilated. Left ventricular diastolic parameters are indeterminate.  2. Right ventricular systolic function is low normal. The right  ventricular size is mildly enlarged. There is normal pulmonary artery systolic pressure.  3. Left atrial size was severely dilated.  4. The mitral valve is normal in structure. Moderate to severe mitral valve regurgitation.  5. The aortic valve is calcified. Aortic valve regurgitation is moderate. Aortic valve sclerosis/calcification is present, without any evidence of aortic stenosis.  6. The inferior vena cava is dilated in size with <50% respiratory variability, suggesting right atrial pressure of 15 mmHg. FINDINGS  Left Ventricle: Left ventricular ejection fraction, by estimation, is 20 to 25%. The left ventricle has severely decreased function. The left ventricle demonstrates global hypokinesis. The left ventricular internal cavity size was severely dilated. There is no left ventricular hypertrophy. Left ventricular diastolic parameters are indeterminate. Right Ventricle: The right ventricular size is mildly enlarged. No increase in right ventricular wall thickness. Right ventricular systolic function is low normal. There is normal pulmonary artery systolic pressure. The tricuspid regurgitant velocity is 2.11 m/s, and with  an assumed right atrial pressure of 15 mmHg, the estimated right ventricular systolic pressure is 32.8 mmHg. Left Atrium: Left atrial size was severely dilated. Right Atrium: Right atrial size was normal in size. Pericardium: There is no evidence of pericardial effusion. Mitral Valve: The mitral valve is normal in structure. Moderate to severe mitral valve regurgitation. MV peak gradient, 3.6 mmHg. The mean mitral valve gradient is 1.0 mmHg. Tricuspid Valve: The tricuspid valve is normal in structure. Tricuspid valve regurgitation is mild. Aortic Valve: The aortic valve is calcified. Aortic valve regurgitation is moderate. Aortic valve sclerosis/calcification is present, without any evidence of aortic stenosis. Aortic valve mean gradient measures 6.0 mmHg. Aortic valve peak gradient measures  12.1 mmHg. Aortic valve area, by VTI measures 1.91 cm. Pulmonic Valve: The pulmonic valve was normal in structure. Pulmonic valve regurgitation is mild to moderate. Aorta: The aortic root is normal in size and structure. Venous: The inferior vena cava is dilated in size with less than 50% respiratory variability, suggesting right atrial pressure of 15 mmHg. IAS/Shunts: No atrial level shunt detected by color flow Doppler. Additional Comments: A device lead is visualized.  LEFT VENTRICLE PLAX 2D LVIDd:         7.10 cm LVIDs:         6.40 cm LV PW:         0.90 cm LV IVS:        0.90 cm LVOT diam:     2.00 cm LV SV:         62 LV SV Index:   39 LVOT Area:     3.14 cm  LV Volumes (MOD) LV vol d, MOD A2C: 209.0 ml LV vol d, MOD A4C: 202.0 ml LV vol s, MOD A2C: 129.0 ml LV vol s, MOD A4C: 160.0 ml LV SV MOD A2C:     80.0 ml LV SV MOD A4C:     202.0 ml LV SV MOD BP:      54.4 ml RIGHT VENTRICLE RV Basal diam:  4.20 cm RV Mid diam:    3.40 cm RV S prime:     12.30 cm/s LEFT ATRIUM              Index        RIGHT ATRIUM           Index LA diam:        5.70 cm  3.62 cm/m   RA Area:     15.50 cm LA Vol (A2C):   130.0 ml 82.57 ml/m  RA Volume:   42.30 ml  26.87 ml/m LA Vol (A4C):   101.0 ml 64.15 ml/m LA Biplane Vol: 116.0 ml 73.68 ml/m  AORTIC VALVE                     PULMONIC VALVE AV Area (Vmax):    1.86 cm      PV Vmax:       0.88 m/s AV Area (Vmean):   2.07 cm      PV Peak grad:  3.1 mmHg AV Area (VTI):     1.91 cm AV Vmax:           174.00 cm/s AV Vmean:          107.900 cm/s AV VTI:            0.323 m AV Peak Grad:      12.1 mmHg AV Mean Grad:      6.0 mmHg LVOT Vmax:  103.10 cm/s LVOT Vmean:        71.050 cm/s LVOT VTI:          0.197 m LVOT/AV VTI ratio: 0.61  AORTA Ao Root diam: 3.30 cm MITRAL VALVE               TRICUSPID VALVE MV Area (PHT): 4.60 cm    TR Peak grad:   17.8 mmHg MV Area VTI:   2.11 cm    TR Vmax:        211.00 cm/s MV Peak grad:  3.6 mmHg MV Mean grad:  1.0 mmHg    SHUNTS MV  Vmax:       0.95 m/s    Systemic VTI:  0.20 m MV Vmean:      51.1 cm/s   Systemic Diam: 2.00 cm MV Decel Time: 165 msec MR Peak grad: 108.2 mmHg MR Mean grad: 61.0 mmHg MR Vmax:      520.00 cm/s MR Vmean:     360.0 cm/s MV E velocity: 74.30 cm/s MV A velocity: 84.20 cm/s MV E/A ratio:  0.88 Debbe Odea MD Electronically signed by Debbe Odea MD Signature Date/Time: 11/18/2022/4:47:44 PM    Final    DG Swallowing Func-Speech Pathology  Result Date: 11/18/2022 Table formatting from the original result was not included. Modified Barium Swallow Study Patient Details Name: Spencer Coleman MRN: [id number] Date of Birth: Jun 08, 1930 Today's Date: 11/18/2022 HPI/PMH: Pt is a 89 y.o. male past medical history significant for Dementia, paroxysmal atrial fibrillation on Eliquis, ventricular tachycardia with ICD, hyperlipidemia, hypertension, CHF with reduced EF, presents to the emergency department following a fall.  History is provided by the patient's sister at bedside.  States that he had a fall yesterday out of bed and was unable to get up.  States that he has had falls in the past but has always been able to get up on his own.  Laid on the ground throughout the night.  EMS assisted him up this morning.  Again had another fall later this afternoon.  Pt lives at home Alone.  CT of Chest: Right-sided volume loss with elevation of the right hemidiaphragm  and right middle and lower lobar collapse and consolidation.  Superimposed extensive airway impaction within the right lower lobe  and, to a lesser extent, right middle lobe and peribronchial  nodularity which may relate to superimposed changes aspiration or  acute infection. Trace right pleural effusion.  3. Mild to moderate global cardiomegaly with left ventricular  dilation. Extensive multi-vessel coronary artery calcification.  Morphologic changes in keeping with pulmonary arterial hypertension.  Head CT: No evidence of acute infarct, hemorrhage, mass, mass  effect,  or midline shift. No hydrocephalus or extra-axial fluid collection.  Age related cerebral atrophy. Clinical Impression Patient presents with functional oropharyngeal swallowing for Age. NO aspiration nor laryngeal penetration noted w/ any consistency. Pt fed self w/ setup support. Intermittent verbal cues given; baseline Cognitive decline/Dementia dx per chart. Oral stage is characterized by adequate lip closure, but min disorganized bolus preparation and containment followed by min decreased control of liquid boluses during anterior to posterior transit. Trace+ oral residue noted diffusely in oral cavity on tongue and upper denture plate; a f/u Dry swallow cleared this residue. Min increased mastication time/effort noted w/ soft solid d/t impact of Lacking Bottom Dentition (upper denture plate only).  Swallow initiation occurs at the level of the valleculae w/ majority of trials; thin liquids spilled inconsistently from the valleculae. Pharyngeal stage is noted for  mildly reduced tongue base retraction (likely age-related), adequate hyolaryngeal excursion, and adequate pharyngeal constriction. Epiglottic deflection is complete; there is NO penetration nor aspiration. There is trace-min BOT, valleculae and intermittently aeryepiglottic folds/pyriform sinus residue which cleared w/ f/u Dry swallow. Pharyngeal stripping wave is complete. Amplitude/duration of cricopharyngeus opening is WFL. There is adequate/complete clearance through the cervical esophagus. No retrograde activity noted in the cervical esophagus.  Consistencies tested were thin liquids x2 tsps, 3 cup sip, 2-3 sequential sips slowly, nectar x1 tsp, 1 cup sip, honey x1 tsp, pudding x1 tsp, regular solid (1/2 graham cracker with pudding). Would recommend Pills given in a Puree for the increased viscosity and d/t advanced Age of pt. Recommend continue a farily regular/mech soft diet consistency with thin liquids VIA CUP; general aspiration  precautions including SMALL, SINGLE sips SLOWLY and moistening meats/foods for easier mastication. Recommend a Dry swallow after bites and sips -- BUT pt already does a Dry swallow independently.  No further ST services indicated.  IF it is felt that he could have REFLUX activty contributing to risk for aspiration of retrograde, Reflux activity, the recommend f/u w/ GI for assessment.  MD and NSG made aware of the MBSS results and recommendations. Factors that may increase risk of adverse event in presence of aspiration Rubye Oaks & Clearance Coots 2021): Factors that may increase risk of adverse event in presence of aspiration Rubye Oaks & Clearance Coots 2021): Poor general health and/or compromised immunity; Reduced cognitive function; Frail or deconditioned (Lacking Bottom Dentition) Recommendations/Plan: Swallowing Evaluation Recommendations Swallowing Evaluation Recommendations Recommendations: PO diet PO Diet Recommendation: Dysphagia 3 (Mechanical soft); Thin liquids (Level 0) Liquid Administration via: Cup; No straw Medication Administration: Whole meds with puree (vs Crushed) Supervision: Patient able to self-feed; Set-up assistance for safety (monitor for needs) Swallowing strategies  : Minimize environmental distractions; Slow rate; Small bites/sips; Multiple dry swallows after each bite/sip Postural changes: Position pt fully upright for meals; Stay upright 30-60 min after meals Oral care recommendations: Oral care BID (2x/day); Pt independent with oral care (staff support) Recommended consults: -- (Dietician; Palliative Care for GOC) Treatment Plan Treatment Plan Treatment recommendations: No treatment recommended at this time Follow-up recommendations: No SLP follow up Recommendations Comment: appears at his baseline Functional status assessment: Patient has not had a recent decline in their functional status. Treatment frequency: -- (n/a) Treatment duration: -- (n/a) Interventions: Aspiration precaution training;  Patient/family education Recommendations Recommendations for follow up therapy are one component of a multi-disciplinary discharge planning process, led by the attending physician.  Recommendations may be updated based on patient status, additional functional criteria and insurance authorization. Assessment: Orofacial Exam: Orofacial Exam Oral Cavity: Oral Hygiene: WFL Oral Cavity - Dentition: Edentulous; Dentures, top (on bottom) Orofacial Anatomy: WFL Oral Motor/Sensory Function: WFL Anatomy: Anatomy: WFL Boluses Administered: Boluses Administered Boluses Administered: Thin liquids (Level 0); Mildly thick liquids (Level 2, nectar thick); Moderately thick liquids (Level 3, honey thick); Puree; Solid  Oral Impairment Domain: Oral Impairment Domain Lip Closure: No labial escape Tongue control during bolus hold: Escape to lateral buccal cavity/floor of mouth; Posterior escape of less than half of bolus Bolus preparation/mastication: Disorganized chewing/mashing with solid pieces of bolus unchewed (min d/t edentulous on bottom; top denture plate only) Bolus transport/lingual motion: Brisk tongue motion Oral residue: Trace residue lining oral structures (diffuse) Location of oral residue : Tongue (denture plate) Initiation of pharyngeal swallow : Valleculae; Posterior laryngeal surface of the epiglottis (spilling from valleculae w/ thin liquids)  Pharyngeal Impairment Domain: Pharyngeal Impairment Domain Soft palate elevation:  No bolus between soft palate (SP)/pharyngeal wall (PW) Laryngeal elevation: Complete superior movement of thyroid cartilage with complete approximation of arytenoids to epiglottic petiole Anterior hyoid excursion: Complete anterior movement Epiglottic movement: Complete inversion Laryngeal vestibule closure: Complete, no air/contrast in laryngeal vestibule Pharyngeal stripping wave : Present - diminished (functional) Pharyngeal contraction (A/P view only): N/A Pharyngoesophageal segment opening:  Complete distension and complete duration, no obstruction of flow Tongue base retraction: No contrast between tongue base and posterior pharyngeal wall (PPW) Pharyngeal residue: Trace residue within or on pharyngeal structures Location of pharyngeal residue: Tongue base; Valleculae; Aryepiglottic folds (cleared w/ f/u swallow)  Esophageal Impairment Domain: Esophageal Impairment Domain Esophageal clearance upright position: Complete clearance, esophageal coating Pill: No data recorded Penetration/Aspiration Scale Score: Penetration/Aspiration Scale Score 1.  Material does not enter airway: Thin liquids (Level 0); Mildly thick liquids (Level 2, nectar thick); Moderately thick liquids (Level 3, honey thick); Puree; Solid Compensatory Strategies: Compensatory Strategies Compensatory strategies: Yes Multiple swallows: Effective (f/u dry swallow x1) Effective Multiple Swallows: Thin liquid (Level 0); Mildly thick liquid (Level 2, nectar thick); Moderately thick liquid (Level 3, honey thick); Puree; Solid   General Information: Caregiver present: No  Diet Prior to this Study: NPO   Temperature : Normal (wbc 7.7)   Respiratory Status: WFL   Supplemental O2: None (Room air)   History of Recent Intubation: No  Behavior/Cognition: Alert; Cooperative; Pleasant mood; Distractible; Requires cueing (baseline Dementia dx) Self-Feeding Abilities: Able to self-feed; Needs set-up for self-feeding Baseline vocal quality/speech: Normal Volitional Cough: Able to elicit Volitional Swallow: Able to elicit Exam Limitations: No limitations Goal Planning: Prognosis for improved oropharyngeal function: Good Barriers to Reach Goals: Cognitive deficits Barriers/Prognosis Comment: baseline Dementia Patient/Family Stated Goal: "something to drink" Consulted and agree with results and recommendations: Patient; Nurse; Physician Pain: Pain Assessment Pain Assessment: No/denies pain End of Session: Start Time:SLP Start Time (ACUTE ONLY): 0915 Stop  Time: SLP Stop Time (ACUTE ONLY): 1015 Time Calculation:SLP Time Calculation (min) (ACUTE ONLY): 60 min Charges: SLP Evaluations $ SLP Speech Visit: 1 Visit SLP Evaluations $MBS Swallow: 1 Procedure SLP visit diagnosis: SLP Visit Diagnosis: Dysphagia, unspecified (R13.10) Past Medical History: Past Medical History: Diagnosis Date  CAD (coronary artery disease)   Nonobstructive cardiac disease by cath  CHF (congestive heart failure) (HCC)   nonischemic cardiomyopathy  CKD (chronic kidney disease)   History of kidney stones   Hyperlipidemia   Hypertension   Inguinal hernia   PAF (paroxysmal atrial fibrillation) (HCC)   Patella fracture 01/09/2015  Renal colic 10/07/2011  Ventricular tachycardia (HCC)   s/p ICD Past Surgical History: Past Surgical History: Procedure Laterality Date  CARDIAC CATHETERIZATION    CARDIAC DEFIBRILLATOR PLACEMENT  2009  CYSTOSCOPY WITH LITHOLAPAXY N/A 02/21/2018  Procedure: CYSTOSCOPY WITH LITHOLAPAXY;  Surgeon: Sondra Come, MD;  Location: [facility];  Service: Urology;  Laterality: N/A;  ELBOW SURGERY Left   HOLMIUM LASER APPLICATION N/A 02/21/2018  Procedure: HOLMIUM LASER APPLICATION;  Surgeon: Sondra Come, MD;  Location: [facility];  Service: Urology;  Laterality: N/A;  ICD GENERATOR CHANGEOUT N/A 01/28/2021  Procedure: ICD GENERATOR CHANGEOUT;  Surgeon: Marcina Millard, MD;  Location: [facility];  Service: Cardiovascular;  Laterality: N/A;  INGUINAL HERNIA REPAIR    LITHOTRIPSY    ORIF left wrist fracture Left   ORIF PATELLA Left 01/10/2015  Procedure: OPEN REDUCTION INTERNAL (ORIF) FIXATION PATELLA;  Surgeon: Donato Heinz, MD;  Location: [facility];  Service: Orthopedics;  Laterality: Left;  ORIF PATELLA Left 02/06/2015  Procedure: OPEN REDUCTION INTERNAL (ORIF) FIXATION PATELLA;  Surgeon: Donato Heinz, MD;  Location: [facility];  Service: Orthopedics;  Laterality: Left;  TRANSURETHRAL RESECTION OF PROSTATE N/A 02/21/2018  Procedure: TRANSURETHRAL RESECTION OF THE  PROSTATE (TURP);  Surgeon: Sondra Come, MD;  Location: [facility];  Service: Urology;  Laterality: N/A; Jerilynn Som, MS, CCC-SLP Speech Language Pathologist Rehab Services; [facility] [place] [phone] (ascom) Watson,Katherine 11/18/2022, 3:19 PM  US Abdomen Limited RUQ (LIVER/GB)  Result Date: 11/18/2022 CLINICAL DATA:  Abnormal LFTs EXAM: ULTRASOUND ABDOMEN LIMITED RIGHT UPPER QUADRANT COMPARISON:  None Available. FINDINGS: Gallbladder: Gallbladder wall thickening. Gallstones, largest measures 1.2 cm. Pericholecystic fluid. No sonographic Murphy sign noted by sonographer. Common bile duct: Diameter: 2 mm Liver: No focal lesion identified. Increased parenchymal echogenicity. Portal vein is patent on color Doppler imaging with normal direction of blood flow towards the liver. Other: None. IMPRESSION: 1. Gallstones, gallbladder wall thickening and pericholecystic fluid. Although findings are concerning for acute cholecystitis, sonographic Eulah Pont sign is negative and findings could also be secondary to systemic process. Recommend clinical correlation. 2. Hepatic steatosis. Electronically Signed   By: Allegra Lai M.D.   On: 11/18/2022 10:40   CT Angio Chest Pulmonary Embolism (PE) W or WO Contrast  Result Date: 11/12/2022 CLINICAL DATA:  Pulmonary embolism (PE) suspected, high prob RLE swelling, elevated trop, elevated LFTs, AMS, falls; Abdominal pain, acute, nonlocalized elevated LFTs and t bili EXAM: CT ANGIOGRAPHY CHEST CT ABDOMEN AND PELVIS WITH CONTRAST TECHNIQUE: Multidetector CT imaging of the chest was performed using the standard protocol during bolus administration of intravenous contrast. Multiplanar CT image reconstructions and MIPs were obtained to evaluate the vascular anatomy. Multidetector CT imaging of the abdomen and pelvis was performed using the standard protocol during bolus administration of intravenous contrast. RADIATION DOSE REDUCTION: This exam was performed according to  the departmental dose-optimization program which includes automated exposure control, adjustment of the mA and/or kV according to patient size and/or use of iterative reconstruction technique. CONTRAST:  OMNIPAQUE IOHEXOL 350 MG/ML SOLN COMPARISON:  06/12/2021 FINDINGS: CTA CHEST FINDINGS Cardiovascular: Apparent filling defects within the right middle and lower lobar pulmonary arteries are artifactual and related to pulmonary vascular redistribution of setting of consolidation. Delayed imaging on CT examination of the abdomen pelvis demonstrates patency of these structures and no intraluminal filling defect identified to suggest pulmonary embolism. The central pulmonary arteries, however, are enlarged in keeping with changes of pulmonary arterial hypertension. Extensive multi-vessel coronary artery calcification. Extensive calcification of the aortic valve leaflets. Mild to moderate global cardiomegaly with left ventricular dilation. Left subclavian pacemaker in place with leads within the right atrium, right ventricle, and left ventricular venous outflow. No pericardial effusion. Moderate atherosclerotic calcification within the thoracic aorta. No aortic aneurysm. Mediastinum/Nodes: Visualized thyroid is unremarkable. No pathologic thoracic adenopathy. Esophagus unremarkable. Lungs/Pleura: There is right-sided volume loss with elevation of the right hemidiaphragm and right middle and lower lobar collapse and consolidation. Superimposed extensive airway impaction within the right lower lobe and, to a lesser extent, right middle lobe and peribronchial nodularity is seen which may relate to superimposed changes aspiration or acute infection. Trace right pleural effusion. No pneumothorax. Musculoskeletal: No acute bone abnormality. No lytic or blastic bone lesion. Review of the MIP images confirms the above findings. CT ABDOMEN and PELVIS FINDINGS Hepatobiliary: Cholelithiasis without pericholecystic  inflammatory changes identified. Liver unremarkable. No intra or extrahepatic biliary ductal dilation. Pancreas: Unremarkable Spleen: Unremarkable Adrenals/Urinary Tract: The adrenal glands are unremarkable. The kidneys are mildly atrophic.  Multiple simple cortical and parapelvic cysts are seen within the kidneys bilaterally for which no follow-up imaging is recommended. The kidneys are otherwise unremarkable. The bladder contains a 15 mm dependently layering calculus. The bladder is not distended. Stomach/Bowel: The stomach, small bowel, and large bowel are unremarkable. The appendix is absent. No free intraperitoneal gas or fluid. Vascular/Lymphatic: Extensive aortoiliac atherosclerotic calcification. Extensive visceral arteriosclerosis. No aortic aneurysm. No pathologic adenopathy within the abdomen and pelvis. Reproductive: Prostate is unremarkable. Other: No abdominal wall hernia. Diffuse subcutaneous body wall edema is present in keeping with changes of mild anasarca. Musculoskeletal: Osseous structures are age-appropriate. Diffuse osteopenia. No acute bone abnormality. No lytic or blastic bone lesion. Review of the MIP images confirms the above findings. IMPRESSION: 1. No pulmonary embolism. 2. Right-sided volume loss with elevation of the right hemidiaphragm and right middle and lower lobar collapse and consolidation. Superimposed extensive airway impaction within the right lower lobe and, to a lesser extent, right middle lobe and peribronchial nodularity which may relate to superimposed changes aspiration or acute infection. Trace right pleural effusion. 3. Mild to moderate global cardiomegaly with left ventricular dilation. Extensive multi-vessel coronary artery calcification. Morphologic changes in keeping with pulmonary arterial hypertension. 4. Cholelithiasis. 5. 15 mm dependently layering calculus within the bladder. 6. Mild anasarca. Aortic Atherosclerosis (ICD10-I70.0). Electronically Signed   By:  Helyn Numbers M.D.   On: 12/10/2022 22:15   CT ABDOMEN PELVIS W CONTRAST  Result Date: 11/25/2022 CLINICAL DATA:  Pulmonary embolism (PE) suspected, high prob RLE swelling, elevated trop, elevated LFTs, AMS, falls; Abdominal pain, acute, nonlocalized elevated LFTs and t bili EXAM: CT ANGIOGRAPHY CHEST CT ABDOMEN AND PELVIS WITH CONTRAST TECHNIQUE: Multidetector CT imaging of the chest was performed using the standard protocol during bolus administration of intravenous contrast. Multiplanar CT image reconstructions and MIPs were obtained to evaluate the vascular anatomy. Multidetector CT imaging of the abdomen and pelvis was performed using the standard protocol during bolus administration of intravenous contrast. RADIATION DOSE REDUCTION: This exam was performed according to the departmental dose-optimization program which includes automated exposure control, adjustment of the mA and/or kV according to patient size and/or use of iterative reconstruction technique. CONTRAST:  OMNIPAQUE IOHEXOL 350 MG/ML SOLN COMPARISON:  06/12/2021 FINDINGS: CTA CHEST FINDINGS Cardiovascular: Apparent filling defects within the right middle and lower lobar pulmonary arteries are artifactual and related to pulmonary vascular redistribution of setting of consolidation. Delayed imaging on CT examination of the abdomen pelvis demonstrates patency of these structures and no intraluminal filling defect identified to suggest pulmonary embolism. The central pulmonary arteries, however, are enlarged in keeping with changes of pulmonary arterial hypertension. Extensive multi-vessel coronary artery calcification. Extensive calcification of the aortic valve leaflets. Mild to moderate global cardiomegaly with left ventricular dilation. Left subclavian pacemaker in place with leads within the right atrium, right ventricle, and left ventricular venous outflow. No pericardial effusion. Moderate atherosclerotic calcification within the  thoracic aorta. No aortic aneurysm. Mediastinum/Nodes: Visualized thyroid is unremarkable. No pathologic thoracic adenopathy. Esophagus unremarkable. Lungs/Pleura: There is right-sided volume loss with elevation of the right hemidiaphragm and right middle and lower lobar collapse and consolidation. Superimposed extensive airway impaction within the right lower lobe and, to a lesser extent, right middle lobe and peribronchial nodularity is seen which may relate to superimposed changes aspiration or acute infection. Trace right pleural effusion. No pneumothorax. Musculoskeletal: No acute bone abnormality. No lytic or blastic bone lesion. Review of the MIP images confirms the above findings. CT ABDOMEN and PELVIS FINDINGS Hepatobiliary:  Cholelithiasis without pericholecystic inflammatory changes identified. Liver unremarkable. No intra or extrahepatic biliary ductal dilation. Pancreas: Unremarkable Spleen: Unremarkable Adrenals/Urinary Tract: The adrenal glands are unremarkable. The kidneys are mildly atrophic. Multiple simple cortical and parapelvic cysts are seen within the kidneys bilaterally for which no follow-up imaging is recommended. The kidneys are otherwise unremarkable. The bladder contains a 15 mm dependently layering calculus. The bladder is not distended. Stomach/Bowel: The stomach, small bowel, and large bowel are unremarkable. The appendix is absent. No free intraperitoneal gas or fluid. Vascular/Lymphatic: Extensive aortoiliac atherosclerotic calcification. Extensive visceral arteriosclerosis. No aortic aneurysm. No pathologic adenopathy within the abdomen and pelvis. Reproductive: Prostate is unremarkable. Other: No abdominal wall hernia. Diffuse subcutaneous body wall edema is present in keeping with changes of mild anasarca. Musculoskeletal: Osseous structures are age-appropriate. Diffuse osteopenia. No acute bone abnormality. No lytic or blastic bone lesion. Review of the MIP images confirms the  above findings. IMPRESSION: 1. No pulmonary embolism. 2. Right-sided volume loss with elevation of the right hemidiaphragm and right middle and lower lobar collapse and consolidation. Superimposed extensive airway impaction within the right lower lobe and, to a lesser extent, right middle lobe and peribronchial nodularity which may relate to superimposed changes aspiration or acute infection. Trace right pleural effusion. 3. Mild to moderate global cardiomegaly with left ventricular dilation. Extensive multi-vessel coronary artery calcification. Morphologic changes in keeping with pulmonary arterial hypertension. 4. Cholelithiasis. 5. 15 mm dependently layering calculus within the bladder. 6. Mild anasarca. Aortic Atherosclerosis (ICD10-I70.0). Electronically Signed   By: Helyn Numbers M.D.   On: 11/24/2022 22:15   CT Head Wo Contrast  Result Date: 12/04/2022 CLINICAL DATA:  Multiple falls EXAM: CT HEAD WITHOUT CONTRAST CT CERVICAL SPINE WITHOUT CONTRAST TECHNIQUE: Multidetector CT imaging of the head and cervical spine was performed following the standard protocol without intravenous contrast. Multiplanar CT image reconstructions of the cervical spine were also generated. RADIATION DOSE REDUCTION: This exam was performed according to the departmental dose-optimization program which includes automated exposure control, adjustment of the mA and/or kV according to patient size and/or use of iterative reconstruction technique. COMPARISON:  05/16/2022 CT head, no prior CT cervical spine. FINDINGS: CT HEAD FINDINGS Brain: No evidence of acute infarct, hemorrhage, mass, mass effect, or midline shift. No hydrocephalus or extra-axial fluid collection. Age related cerebral atrophy. Vascular: No hyperdense vessel. Atherosclerotic calcifications in the intracranial carotid and vertebral arteries. Skull: Negative for fracture or focal lesion. Sinuses/Orbits: No acute finding. Chronic left lamina papyracea fracture. Other:  The mastoid air cells are well aerated. CT CERVICAL SPINE FINDINGS Alignment: No traumatic listhesis. Trace anterolisthesis of to C5 on C6 and C7 on T1, which appears facet mediated. Dextrocurvature of the cervical spine may be positional. Skull base and vertebrae: No acute fracture or suspicious osseous lesion. Soft tissues and spinal canal: No prevertebral fluid or swelling. No visible canal hematoma. Disc levels: Degenerative changes in the cervical spine.No high-grade spinal canal stenosis. Upper chest: No focal pulmonary opacity or pleural effusion. IMPRESSION: 1. No acute intracranial process. 2. No acute fracture or traumatic listhesis in the cervical spine. Electronically Signed   By: Wiliam Ke M.D.   On: 12/06/2022 19:51   CT CERVICAL SPINE WO CONTRAST  Result Date: 12/01/2022 CLINICAL DATA:  Multiple falls EXAM: CT HEAD WITHOUT CONTRAST CT CERVICAL SPINE WITHOUT CONTRAST TECHNIQUE: Multidetector CT imaging of the head and cervical spine was performed following the standard protocol without intravenous contrast. Multiplanar CT image reconstructions of the cervical spine were also generated. RADIATION DOSE  REDUCTION: This exam was performed according to the departmental dose-optimization program which includes automated exposure control, adjustment of the mA and/or kV according to patient size and/or use of iterative reconstruction technique. COMPARISON:  05/16/2022 CT head, no prior CT cervical spine. FINDINGS: CT HEAD FINDINGS Brain: No evidence of acute infarct, hemorrhage, mass, mass effect, or midline shift. No hydrocephalus or extra-axial fluid collection. Age related cerebral atrophy. Vascular: No hyperdense vessel. Atherosclerotic calcifications in the intracranial carotid and vertebral arteries. Skull: Negative for fracture or focal lesion. Sinuses/Orbits: No acute finding. Chronic left lamina papyracea fracture. Other: The mastoid air cells are well aerated. CT CERVICAL SPINE FINDINGS  Alignment: No traumatic listhesis. Trace anterolisthesis of to C5 on C6 and C7 on T1, which appears facet mediated. Dextrocurvature of the cervical spine may be positional. Skull base and vertebrae: No acute fracture or suspicious osseous lesion. Soft tissues and spinal canal: No prevertebral fluid or swelling. No visible canal hematoma. Disc levels: Degenerative changes in the cervical spine.No high-grade spinal canal stenosis. Upper chest: No focal pulmonary opacity or pleural effusion. IMPRESSION: 1. No acute intracranial process. 2. No acute fracture or traumatic listhesis in the cervical spine. Electronically Signed   By: Wiliam Ke M.D.   On: 11/14/2022 19:51               LOS: 2 days   Beverlyann Broxterman  [facility]   Pager on [email or web address]. If 7PM-7AM, please contact night-coverage at [email or web address]     11/19/2022, 2:08 PM

## 2022-11-19 NOTE — Progress Notes (Signed)
GI Inpatient Follow-up Note  Subjective:  Patient seen and is mildly delirious. Apparently he was pulling out IV's last night. Liver enzymes are trending down.  Scheduled Inpatient Medications:   furosemide  80 mg Intravenous Q12H   sodium chloride flush  10 mL Intravenous Q12H    Continuous Inpatient Infusions:    ceFEPime (MAXIPIME) IV Stopped (11/19/22 0050)   heparin 950 Units/hr (11/19/22 0708)   vancomycin      PRN Inpatient Medications:  acetaminophen **OR** acetaminophen, ondansetron **OR** ondansetron (ZOFRAN) IV  Review of Systems:  Unable to assess due to mental status   Physical Examination: BP 102/70   Pulse (!) 111   Temp 98.7 F (37.1 C) (Axillary)   Resp 19   Ht 5\' 10"  (1.778 m)   Wt 67.6 kg   SpO2 99%   BMI 21.38 kg/m  Gen: NAD HEENT: PEERLA, EOMI, Neck: supple, no JVD or thyromegaly Chest: No respiratory distress Abd: soft, non-tender, non-distended Ext: trace edema Skin: no rash or lesions noted Lymph: no LAD  Data: Lab Results  Component Value Date   WBC 6.6 11/19/2022   HGB 13.6 11/19/2022   HCT 41.5 11/19/2022   MCV 92.8 11/19/2022   PLT 96 (L) 11/19/2022   Recent Labs  Lab 11/30/2022 1404 11/18/22 0454 11/19/22 0623  HGB 12.0* 12.4* 13.6   Lab Results  Component Value Date   NA 133 (L) 11/19/2022   K 3.9 11/19/2022   CL 98 11/19/2022   CO2 25 11/19/2022   BUN 38 (H) 11/19/2022   CREATININE 1.92 (H) 11/19/2022   Lab Results  Component Value Date   ALT 1,148 (H) 11/19/2022   AST 887 (H) 11/19/2022   ALKPHOS 105 11/19/2022   BILITOT 1.6 (H) 11/19/2022   Recent Labs  Lab 11/18/22 0215 11/18/22 1007 11/18/22 2014  APTT 40*   < > 60*  INR 1.5*  --   --    < > = values in this interval not displayed.   Assessment/Plan: Mr. Maidonado is a 89 y.o. gentleman with history of HFrEF (EF 20-25%), CAD, CKD, and hypertension who presents after multiple falls and subsequently found to have severely elevated AST/ALT that is likely  due to ischemic hepatopathy as the only other causes of severe elevation would be viral hepatitis and tylenol overdose. The main treatment of ischemic hepatopathy is supportive care and maintaining normotension. The indirect hyperbilirubinemia is consistent with [place] Lone disease. Abnormal gallbladder imaging likely secondary to severely reduced EF   Recommendations:  - supportive care, maintain normotension - f/u hepatitis panel - daily CMP's. Liver enzymes seem to have already peaked and should continue to trend down - would strongly consider palliative care discussion given this is his 3rd-4th hospitalization this year, sun downing, and comorbidities. He would likely do much better at home than continued hospitalization. - will follow peripherally   Please call with any questions or concerns.  Merlyn Lot MD, MPH [facility] GI

## 2022-11-19 NOTE — Progress Notes (Signed)
PHARMACY - ANTICOAGULATION CONSULT NOTE  Pharmacy Consult for Heparin  Indication: chest pain/ACS  No Known Allergies  Patient Measurements: Height: 5\' 10"  (177.8 cm) Weight: 67.6 kg (149 lb) IBW/kg (Calculated) : 73 Heparin Dosing Weight: 46.7 kg   Vital Signs: Temp: 97.9 F (36.6 C) (11/09 0302) Temp Source: Oral (11/09 0302) BP: 102/70 (11/09 0530) Pulse Rate: 111 (11/09 0530)  Labs: Recent Labs    11/26/2022 1404 11/19/2022 1404 11/23/2022 1718 11/28/2022 2249 11/18/22 0215 11/18/22 0454 11/18/22 1007 11/18/22 1420 11/18/22 2014 11/19/22 0623  HGB 12.0*  --   --   --   --  12.4*  --   --   --  13.6  HCT 36.5*  --   --   --   --  38.8*  --   --   --  41.5  PLT 75*  --   --   --   --  70*  --   --   --  96*  APTT  --   --   --   --  40*  --  33  --  60*  --   LABPROT  --   --   --   --  18.3*  --   --   --   --   --   INR  --   --   --   --  1.5*  --   --   --   --   --   HEPARINUNFRC  --    < >  --   --  0.27*  --   --  0.31 0.30 0.22*  CREATININE 1.92*  --   --   --   --  1.71*  --   --   --   --   CKTOTAL  --   --  920*  --   --  692*  --   --   --   --   TROPONINIHS  --   --  476* 356*  --   --   --   --   --   --    < > = values in this interval not displayed.    Estimated Creatinine Clearance: 27.5 mL/min (A) (by C-G formula based on SCr of 1.71 mg/dL (H)).   Medical History: Past Medical History:  Diagnosis Date   CAD (coronary artery disease)    Nonobstructive cardiac disease by cath   CHF (congestive heart failure) (HCC)    nonischemic cardiomyopathy   CKD (chronic kidney disease)    History of kidney stones    Hyperlipidemia    Hypertension    Inguinal hernia    PAF (paroxysmal atrial fibrillation) (HCC)    Patella fracture 01/09/2015   Renal colic 10/07/2011   Ventricular tachycardia (HCC)    s/p ICD    Medications:  Eliquis 2.5 mg twice daily Last dose  Assessment: 89 yo M with PMH Afib (on Eliquis), frequent falls, frailty, dementia,  HLD, HTN, CHF (EF 20-25%) due to DCM s/p AICD, CKD3 presents with ACS. His ECG exhibits atrial fibrillation and RBBB. Pharmacy consulted to dose heparin. Notably patient takes apixaban 2.5 mg twice daily prior to admission (with recent dispense history) however baseline heparin level does not exhibit false elevation, so will titrate by antiXa level going forward. Also, patient with low baseline platelet level.  Date Time aPTT/HL  Rate/Comment 11/08 0215 40 s and HL 0.27 Subtherapeutic and correlating 11/08 1007 33 s  Subtherapeutic 11/08 1420 HL 0.31  Irrelevant lab (ordered as add-on to 1007; collected as new draw) 11/08 2014 HL 0.30  Therapeutic, but on the lower end 11/09   0623    HL  0.22                     SUBtherapeutic       Baseline Labs: aPTT - 40; antiXa - 0.27; INR - 1.5; Hgb - 12.0; Plts - 75   Goal of Therapy:  Heparin level 0.3-0.7 units/ml aPTT 66 -102  seconds Monitor platelets by anticoagulation protocol: Yes   Plan:  11/09: HL @ 0623 = 0.22, SUBtherapeutic  - Will order heparin 700 units IV X 1 bolus and increase drip rate to 950 units/hr Check antiXa level in 8 hours after rate change Continue to monitor CBC daily while on heparin infusion.  Tavie Haseman D Clinical Pharmacist 11/19/2022 7:06 AM

## 2022-11-19 NOTE — Progress Notes (Signed)
[facility] CLINIC CARDIOLOGY PROGRESS NOTE   Patient ID: Spencer Coleman MRN: [id number] DOB/AGE: 09-14-1930 89 y.o.  Admit date: 12/09/2022 Referring Physician Lindajo Royal, MD Primary Physician Barbette Reichmann, MD Primary Cardiologist Marcina Millard, MD & Clarisa Kindred, FNP Reason for Consultation CHF   HPI: Spencer Coleman is a 89 y.o. 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 Cardiology was consulted for further evaluation.    Majority of patient's history obtained from chart review due to patient's baseline dementia. Patient's sister reported as historian from ED notes.  Patient presented to the ED after falling out of bed on the night of 11/6 and unable to get up. Patient laid on the ground throughout the night and EMS helped patient get up. Denies any head injury or loss of consciousness. Patient states he's been having weakness in both legs. Patient endorses LE swelling, but states has chronic swelling to his right leg. Denies nausea/vomiting/diarrhea. Patient's oldest daughter is his healthcare power of attorney.    Work up in the ED notable for Na 133, K 4.4, bicarb 20, Cr 1.92, Alk Phos 134, AST 1,751, ALT 1,049, total bili 2.6, GFR 33, Hgb 12.0, Platelets 75. CK 920. Lactic Acid 1.8. Troponin 476 > 356. BNP > 4,500. EKG appears to be sinus rhythm PVCs with rate 67 bpm. CT cervical spine, CT head showed no acute abnormalities. CTA Chest showed no pulmonary embolism, elevated right hemidiaphragm and right middle and lower lobar collapse and consolidation, trace pleural effusions. Patient vitals upon admission was BP 99/51, HR 74 bpm,  afebrile. Patient was started on fluids, cefepime and vancomycin.  Interval History:  -Patient seen and examined at bedside, sister is here with him today. She would like to speak to palliative care and likely transition to hospice care.  Review of systems complete and found to be negative unless listed above    Vitals:   11/19/22 1230 11/19/22 1300 11/19/22 1330 11/19/22 1336  BP: (!) 82/66 (!) 83/59 93/67   Pulse: (!) 121 (!) 120 (!) 116   Resp: (!) 21 20 (!) 23   Temp:    (!) 97.5 F (36.4 C)  TempSrc:    Axillary  SpO2: 99% 100% 100%   Weight:      Height:         Intake/Output Summary (Last 24 hours) at 11/19/2022 1348 Last data filed at 11/19/2022 0228 Gross per 24 hour  Intake 100.7 ml  Output 25 ml  Net 75.7 ml     PHYSICAL EXAM General: Elderly appearing male, well nourished, in no acute distress laying in his hospital bed at an incline HEENT: Normocephalic and atraumatic. Neck: No JVD.   Lungs: Normal respiratory effort on 2L [place]. Crackles and rhonchi bilaterally to auscultation.  Heart: HRRR. Normal S1 and S2 without gallops or murmurs..  Abdomen: Non-distended appearing.  Msk: Normal strength and tone for age. Extremities: Warm and well perfused. No clubbing, cyanosis. 2+ pitting edema.  Neuro: somnolent   LABS: Basic Metabolic Panel: Recent Labs    11/18/22 0454 11/19/22 0623  NA 136 133*  K 4.6 3.9  CL 102 98  CO2 26 25  GLUCOSE 87  114*  BUN 37* 38*  CREATININE 1.71* 1.92*  CALCIUM 8.3* 8.2*  MG 2.1  --    Liver Function Tests: Recent Labs    11/18/22 0454 11/19/22 0623  AST 2,087* 887*  ALT 1,433* 1,148*  ALKPHOS 109 105  BILITOT 1.8* 1.6*  PROT 5.2* 5.1*  ALBUMIN 3.0* 2.8*   No results for input(s): "LIPASE", "AMYLASE" in the last 72 hours. CBC: Recent Labs    11/26/2022 1404 11/18/22 0454 11/19/22 0623  WBC 8.9 7.7 6.6  NEUTROABS 7.8*  --   --   HGB 12.0* 12.4* 13.6  HCT 36.5* 38.8* 41.5  MCV 94.1 94.6 92.8  PLT 75* 70*  96*   Cardiac Enzymes: Recent Labs    11/29/2022 1718 11/26/2022 2249 11/18/22 0454  CKTOTAL 920*  --  692*  TROPONINIHS 476* 356*  --    BNP: Recent Labs    12/03/2022 1404  BNP >4,500.0*   D-Dimer: No results for input(s): "DDIMER" in the last 72 hours. Hemoglobin A1C: No results for input(s): "HGBA1C" in the last 72 hours. Fasting Lipid Panel: No results for input(s): "CHOL", "HDL", "LDLCALC", "TRIG", "CHOLHDL", "LDLDIRECT" in the last 72 hours. Thyroid Function Tests: No results for input(s): "TSH", "T4TOTAL", "T3FREE", "THYROIDAB" in the last 72 hours.  Invalid input(s): "FREET3" Anemia Panel: No results for input(s): "VITAMINB12", "FOLATE", "FERRITIN", "TIBC", "IRON", "RETICCTPCT" in the last 72 hours.  ECHOCARDIOGRAM COMPLETE  Result Date: 11/18/2022    ECHOCARDIOGRAM REPORT   Patient Name:   Spencer Coleman Date of Exam: 11/18/2022 Medical Rec #:  [id number]    Height:       70.0 in Accession #:    [id number]   Weight:       103.0 lb Date of Birth:  [birth year]     BSA:          1.574 m Patient Age:    89 years     BP:           101/68 mmHg Patient Gender: M            HR:           82 bpm. Exam Location:  [facility] Procedure: 2D Echo, Cardiac Doppler and Color Doppler Indications:     CHF  History:         Patient has prior history of Echocardiogram examinations, most                  recent 05/13/2022. CHF, CAD, Pacemaker and Defibrillator,                  Arrythmias:Atrial Fibrillation and Tachycardia; Risk                  Factors:Hypertension and Dyslipidemia. NSTEMI, CKD.  Sonographer:     Spencer Coleman Referring Phys:  [id number] Spencer Coleman Diagnosing Phys: Spencer Odea MD IMPRESSIONS  1. Left ventricular ejection fraction, by estimation, is 20 to 25%. The left ventricle has severely decreased function. The left ventricle demonstrates global hypokinesis. The left ventricular internal cavity size was severely dilated. Left ventricular diastolic parameters are indeterminate.  2.  Right ventricular systolic function is low normal. The right ventricular size is mildly enlarged. There is normal pulmonary artery systolic pressure.  3. Left atrial size was severely dilated.  4. The mitral valve is normal in structure. Moderate to severe mitral valve regurgitation.  5. The aortic valve is calcified. Aortic valve regurgitation is moderate. Aortic valve sclerosis/calcification is present, without  any evidence of aortic stenosis.  6. The inferior vena cava is dilated in size with <50% respiratory variability, suggesting right atrial pressure of 15 mmHg. FINDINGS  Left Ventricle: Left ventricular ejection fraction, by estimation, is 20 to 25%. The left ventricle has severely decreased function. The left ventricle demonstrates global hypokinesis. The left ventricular internal cavity size was severely dilated. There is no left ventricular hypertrophy. Left ventricular diastolic parameters are indeterminate. Right Ventricle: The right ventricular size is mildly enlarged. No increase in right ventricular wall thickness. Right ventricular systolic function is low normal. There is normal pulmonary artery systolic pressure. The tricuspid regurgitant velocity is 2.11 m/s, and with an assumed right atrial pressure of 15 mmHg, the estimated right ventricular systolic pressure is 32.8 mmHg. Left Atrium: Left atrial size was severely dilated. Right Atrium: Right atrial size was normal in size. Pericardium: There is no evidence of pericardial effusion. Mitral Valve: The mitral valve is normal in structure. Moderate to severe mitral valve regurgitation. MV peak gradient, 3.6 mmHg. The mean mitral valve gradient is 1.0 mmHg. Tricuspid Valve: The tricuspid valve is normal in structure. Tricuspid valve regurgitation is mild. Aortic Valve: The aortic valve is calcified. Aortic valve regurgitation is moderate. Aortic valve sclerosis/calcification is present, without any evidence of aortic stenosis. Aortic valve mean  gradient measures 6.0 mmHg. Aortic valve peak gradient measures 12.1 mmHg. Aortic valve area, by VTI measures 1.91 cm. Pulmonic Valve: The pulmonic valve was normal in structure. Pulmonic valve regurgitation is mild to moderate. Aorta: The aortic root is normal in size and structure. Venous: The inferior vena cava is dilated in size with less than 50% respiratory variability, suggesting right atrial pressure of 15 mmHg. IAS/Shunts: No atrial level shunt detected by color flow Doppler. Additional Comments: A device lead is visualized.  LEFT VENTRICLE PLAX 2D LVIDd:         7.10 cm LVIDs:         6.40 cm LV PW:         0.90 cm LV IVS:        0.90 cm LVOT diam:     2.00 cm LV SV:         62 LV SV Index:   39 LVOT Area:     3.14 cm  LV Volumes (MOD) LV vol d, MOD A2C: 209.0 ml LV vol d, MOD A4C: 202.0 ml LV vol s, MOD A2C: 129.0 ml LV vol s, MOD A4C: 160.0 ml LV SV MOD A2C:     80.0 ml LV SV MOD A4C:     202.0 ml LV SV MOD BP:      54.4 ml RIGHT VENTRICLE RV Basal diam:  4.20 cm RV Mid diam:    3.40 cm RV S prime:     12.30 cm/s LEFT ATRIUM              Index        RIGHT ATRIUM           Index LA diam:        5.70 cm  3.62 cm/m   RA Area:     15.50 cm LA Vol (A2C):   130.0 ml 82.57 ml/m  RA Volume:   42.30 ml  26.87 ml/m LA Vol (A4C):   101.0 ml 64.15 ml/m LA Biplane Vol: 116.0 ml 73.68 ml/m  AORTIC VALVE                     PULMONIC VALVE AV  Area (Vmax):    1.86 cm      PV Vmax:       0.88 m/s AV Area (Vmean):   2.07 cm      PV Peak grad:  3.1 mmHg AV Area (VTI):     1.91 cm AV Vmax:           174.00 cm/s AV Vmean:          107.900 cm/s AV VTI:            0.323 m AV Peak Grad:      12.1 mmHg AV Mean Grad:      6.0 mmHg LVOT Vmax:         103.10 cm/s LVOT Vmean:        71.050 cm/s LVOT VTI:          0.197 m LVOT/AV VTI ratio: 0.61  AORTA Ao Root diam: 3.30 cm MITRAL VALVE               TRICUSPID VALVE MV Area (PHT): 4.60 cm    TR Peak grad:   17.8 mmHg MV Area VTI:   2.11 cm    TR Vmax:        211.00 cm/s  MV Peak grad:  3.6 mmHg MV Mean grad:  1.0 mmHg    SHUNTS MV Vmax:       0.95 m/s    Systemic VTI:  0.20 m MV Vmean:      51.1 cm/s   Systemic Diam: 2.00 cm MV Decel Time: 165 msec MR Peak grad: 108.2 mmHg MR Mean grad: 61.0 mmHg MR Vmax:      520.00 cm/s MR Vmean:     360.0 cm/s MV E velocity: 74.30 cm/s MV A velocity: 84.20 cm/s MV E/A ratio:  0.88 Spencer Odea MD Electronically signed by Spencer Odea MD Signature Date/Time: 11/18/2022/4:47:44 PM    Final    DG Swallowing Func-Speech Pathology  Result Date: 11/18/2022 Table formatting from the original result was not included. Modified Barium Swallow Study Patient Details Name: JOSGAR SIXKILLER MRN: [id number] Date of Birth: September 27, 1930 Today's Date: 11/18/2022 HPI/PMH: Pt is a 89 y.o. male past medical history significant for Dementia, paroxysmal atrial fibrillation on Eliquis, ventricular tachycardia with ICD, hyperlipidemia, hypertension, CHF with reduced EF, presents to the emergency department following a fall.  History is provided by the patient's sister at bedside.  States that he had a fall yesterday out of bed and was unable to get up.  States that he has had falls in the past but has always been able to get up on his own.  Laid on the ground throughout the night.  EMS assisted him up this morning.  Again had another fall later this afternoon.  Pt lives at home Alone.  CT of Chest: Right-sided volume loss with elevation of the right hemidiaphragm  and right middle and lower lobar collapse and consolidation.  Superimposed extensive airway impaction within the right lower lobe  and, to a lesser extent, right middle lobe and peribronchial  nodularity which may relate to superimposed changes aspiration or  acute infection. Trace right pleural effusion.  3. Mild to moderate global cardiomegaly with left ventricular  dilation. Extensive multi-vessel coronary artery calcification.  Morphologic changes in keeping with pulmonary arterial hypertension.  Head  CT: No evidence of acute infarct, hemorrhage, mass, mass effect,  or midline shift. No hydrocephalus or extra-axial fluid collection.  Age related cerebral atrophy. Clinical Impression Patient presents with functional oropharyngeal  swallowing for Age. NO aspiration nor laryngeal penetration noted w/ any consistency. Pt fed self w/ setup support. Intermittent verbal cues given; baseline Cognitive decline/Dementia dx per chart. Oral stage is characterized by adequate lip closure, but min disorganized bolus preparation and containment followed by min decreased control of liquid boluses during anterior to posterior transit. Trace+ oral residue noted diffusely in oral cavity on tongue and upper denture plate; a f/u Dry swallow cleared this residue. Min increased mastication time/effort noted w/ soft solid d/t impact of Lacking Bottom Dentition (upper denture plate only).  Swallow initiation occurs at the level of the valleculae w/ majority of trials; thin liquids spilled inconsistently from the valleculae. Pharyngeal stage is noted for mildly reduced tongue base retraction (likely age-related), adequate hyolaryngeal excursion, and adequate pharyngeal constriction. Epiglottic deflection is complete; there is NO penetration nor aspiration. There is trace-min BOT, valleculae and intermittently aeryepiglottic folds/pyriform sinus residue which cleared w/ f/u Dry swallow. Pharyngeal stripping wave is complete. Amplitude/duration of cricopharyngeus opening is WFL. There is adequate/complete Spencer through the cervical esophagus. No retrograde activity noted in the cervical esophagus.  Consistencies tested were thin liquids x2 tsps, 3 cup sip, 2-3 sequential sips slowly, nectar x1 tsp, 1 cup sip, honey x1 tsp, pudding x1 tsp, regular solid (1/2 graham cracker with pudding). Would recommend Pills given in a Puree for the increased viscosity and d/t advanced Age of pt. Recommend continue a farily regular/mech soft diet  consistency with thin liquids VIA CUP; general aspiration precautions including SMALL, SINGLE sips SLOWLY and moistening meats/foods for easier mastication. Recommend a Dry swallow after bites and sips -- BUT pt already does a Dry swallow independently.  No further ST services indicated.  IF it is felt that he could have REFLUX activty contributing to risk for aspiration of retrograde, Reflux activity, the recommend f/u w/ GI for assessment.  MD and NSG made aware of the MBSS results and recommendations. Factors that may increase risk of adverse event in presence of aspiration Spencer Coleman & Spencer Coleman 2021): Factors that may increase risk of adverse event in presence of aspiration Spencer Coleman & Spencer Coleman 2021): Poor general health and/or compromised immunity; Reduced cognitive function; Frail or deconditioned (Lacking Bottom Dentition) Recommendations/Plan: Swallowing Evaluation Recommendations Swallowing Evaluation Recommendations Recommendations: PO diet PO Diet Recommendation: Dysphagia 3 (Mechanical soft); Thin liquids (Level 0) Liquid Administration via: Cup; No straw Medication Administration: Whole meds with puree (vs Crushed) Supervision: Patient able to self-feed; Set-up assistance for safety (monitor for needs) Swallowing strategies  : Minimize environmental distractions; Slow rate; Small bites/sips; Multiple dry swallows after each bite/sip Postural changes: Position pt fully upright for meals; Stay upright 30-60 min after meals Oral care recommendations: Oral care BID (2x/day); Pt independent with oral care (staff support) Recommended consults: -- (Dietician; Palliative Care for GOC) Treatment Plan Treatment Plan Treatment recommendations: No treatment recommended at this time Follow-up recommendations: No SLP follow up Recommendations Comment: appears at his baseline Functional status assessment: Patient has not had a recent decline in their functional status. Treatment frequency: -- (n/a) Treatment duration: --  (n/a) Interventions: Aspiration precaution training; Patient/family education Recommendations Recommendations for follow up therapy are one component of a multi-disciplinary discharge planning process, led by the attending physician.  Recommendations may be updated based on patient status, additional functional criteria and insurance authorization. Assessment: Orofacial Exam: Orofacial Exam Oral Cavity: Oral Hygiene: WFL Oral Cavity - Dentition: Edentulous; Dentures, top (on bottom) Orofacial Anatomy: WFL Oral Motor/Sensory Function: WFL Anatomy: Anatomy: WFL Boluses Administered: Boluses Administered Boluses Administered:  Thin liquids (Level 0); Mildly thick liquids (Level 2, nectar thick); Moderately thick liquids (Level 3, honey thick); Puree; Solid  Oral Impairment Domain: Oral Impairment Domain Lip Closure: No labial escape Tongue control during bolus hold: Escape to lateral buccal cavity/floor of mouth; Posterior escape of less than half of bolus Bolus preparation/mastication: Disorganized chewing/mashing with solid pieces of bolus unchewed (min d/t edentulous on bottom; top denture plate only) Bolus transport/lingual motion: Brisk tongue motion Oral residue: Trace residue lining oral structures (diffuse) Location of oral residue : Tongue (denture plate) Initiation of pharyngeal swallow : Valleculae; Posterior laryngeal surface of the epiglottis (spilling from valleculae w/ thin liquids)  Pharyngeal Impairment Domain: Pharyngeal Impairment Domain Soft palate elevation: No bolus between soft palate (SP)/pharyngeal wall (PW) Laryngeal elevation: Complete superior movement of thyroid cartilage with complete approximation of arytenoids to epiglottic petiole Anterior hyoid excursion: Complete anterior movement Epiglottic movement: Complete inversion Laryngeal vestibule closure: Complete, no air/contrast in laryngeal vestibule Pharyngeal stripping wave : Present - diminished (functional) Pharyngeal contraction (A/P  view only): N/A Pharyngoesophageal segment opening: Complete distension and complete duration, no obstruction of flow Tongue base retraction: No contrast between tongue base and posterior pharyngeal wall (PPW) Pharyngeal residue: Trace residue within or on pharyngeal structures Location of pharyngeal residue: Tongue base; Valleculae; Aryepiglottic folds (cleared w/ f/u swallow)  Esophageal Impairment Domain: Esophageal Impairment Domain Esophageal Spencer upright position: Complete Spencer, esophageal coating Pill: No data recorded Penetration/Aspiration Scale Score: Penetration/Aspiration Scale Score 1.  Material does not enter airway: Thin liquids (Level 0); Mildly thick liquids (Level 2, nectar thick); Moderately thick liquids (Level 3, honey thick); Puree; Solid Compensatory Strategies: Compensatory Strategies Compensatory strategies: Yes Multiple swallows: Effective (f/u dry swallow x1) Effective Multiple Swallows: Thin liquid (Level 0); Mildly thick liquid (Level 2, nectar thick); Moderately thick liquid (Level 3, honey thick); Puree; Solid   General Information: Caregiver present: No  Diet Prior to this Study: NPO   Temperature : Normal (wbc 7.7)   Respiratory Status: WFL   Supplemental O2: None (Room air)   History of Recent Intubation: No  Behavior/Cognition: Alert; Cooperative; Pleasant mood; Distractible; Requires cueing (baseline Dementia dx) Self-Feeding Abilities: Able to self-feed; Needs set-up for self-feeding Baseline vocal quality/speech: Normal Volitional Cough: Able to elicit Volitional Swallow: Able to elicit Exam Limitations: No limitations Goal Planning: Prognosis for improved oropharyngeal function: Good Barriers to Reach Goals: Cognitive deficits Barriers/Prognosis Comment: baseline Dementia Patient/Family Stated Goal: "something to drink" Consulted and agree with results and recommendations: Patient; Nurse; Physician Pain: Pain Assessment Pain Assessment: No/denies pain End of Session:  Start Time:SLP Start Time (ACUTE ONLY): 0915 Stop Time: SLP Stop Time (ACUTE ONLY): 1015 Time Calculation:SLP Time Calculation (min) (ACUTE ONLY): 60 min Charges: SLP Evaluations $ SLP Speech Visit: 1 Visit SLP Evaluations $MBS Swallow: 1 Procedure SLP visit diagnosis: SLP Visit Diagnosis: Dysphagia, unspecified (R13.10) Past Medical History: Past Medical History: Diagnosis Date  CAD (coronary artery disease)   Nonobstructive cardiac disease by cath  CHF (congestive heart failure) (HCC)   nonischemic cardiomyopathy  CKD (chronic kidney disease)   History of kidney stones   Hyperlipidemia   Hypertension   Inguinal hernia   PAF (paroxysmal atrial fibrillation) (HCC)   Patella fracture 01/09/2015  Renal colic 10/07/2011  Ventricular tachycardia (HCC)   s/p ICD Past Surgical History: Past Surgical History: Procedure Laterality Date  CARDIAC CATHETERIZATION    CARDIAC DEFIBRILLATOR PLACEMENT  2009  CYSTOSCOPY WITH LITHOLAPAXY N/A 02/21/2018  Procedure: CYSTOSCOPY WITH LITHOLAPAXY;  Surgeon: Sondra Come, MD;  Location: [facility] ORS;  Service: Urology;  Laterality: N/A;  ELBOW SURGERY Left   HOLMIUM LASER APPLICATION N/A 02/21/2018  Procedure: HOLMIUM LASER APPLICATION;  Surgeon: Sondra Come, MD;  Location: [facility] ORS;  Service: Urology;  Laterality: N/A;  ICD GENERATOR CHANGEOUT N/A 01/28/2021  Procedure: ICD GENERATOR CHANGEOUT;  Surgeon: Marcina Millard, MD;  Location: [facility] INVASIVE CV LAB;  Service: Cardiovascular;  Laterality: N/A;  INGUINAL HERNIA REPAIR    LITHOTRIPSY    ORIF left wrist fracture Left   ORIF PATELLA Left 01/10/2015  Procedure: OPEN REDUCTION INTERNAL (ORIF) FIXATION PATELLA;  Surgeon: Donato Heinz, MD;  Location: [facility] ORS;  Service: Orthopedics;  Laterality: Left;  ORIF PATELLA Left 02/06/2015  Procedure: OPEN REDUCTION INTERNAL (ORIF) FIXATION PATELLA;  Surgeon: Donato Heinz, MD;  Location: [facility] ORS;  Service: Orthopedics;  Laterality: Left;  TRANSURETHRAL RESECTION OF PROSTATE N/A  02/21/2018  Procedure: TRANSURETHRAL RESECTION OF THE PROSTATE (TURP);  Surgeon: Sondra Come, MD;  Location: [facility] ORS;  Service: Urology;  Laterality: N/A; Spencer Som, MS, CCC-SLP Speech Language Pathologist Rehab Services; [facility] -  [phone] (ascom) Watson,Katherine 11/18/2022, 3:19 PM  US Abdomen Limited RUQ (LIVER/GB)  Result Date: 11/18/2022 CLINICAL DATA:  Abnormal LFTs EXAM: ULTRASOUND ABDOMEN LIMITED RIGHT UPPER QUADRANT COMPARISON:  None Available. FINDINGS: Gallbladder: Gallbladder wall thickening. Gallstones, largest measures 1.2 cm. Pericholecystic fluid. No sonographic Murphy sign noted by sonographer. Common bile duct: Diameter: 2 mm Liver: No focal lesion identified. Increased parenchymal echogenicity. Portal vein is patent on color Doppler imaging with normal direction of blood flow towards the liver. Other: None. IMPRESSION: 1. Gallstones, gallbladder wall thickening and pericholecystic fluid. Although findings are concerning for acute cholecystitis, sonographic Eulah Pont sign is negative and findings could also be secondary to systemic process. Recommend clinical correlation. 2. Hepatic steatosis. Electronically Signed   By: Allegra Lai M.D.   On: 11/18/2022 10:40   CT Angio Chest Pulmonary Embolism (PE) W or WO Contrast  Result Date: 11/26/2022 CLINICAL DATA:  Pulmonary embolism (PE) suspected, high prob RLE swelling, elevated trop, elevated LFTs, AMS, falls; Abdominal pain, acute, nonlocalized elevated LFTs and t bili EXAM: CT ANGIOGRAPHY CHEST CT ABDOMEN AND PELVIS WITH CONTRAST TECHNIQUE: Multidetector CT imaging of the chest was performed using the standard protocol during bolus administration of intravenous contrast. Multiplanar CT image reconstructions and MIPs were obtained to evaluate the vascular anatomy. Multidetector CT imaging of the abdomen and pelvis was performed using the standard protocol during bolus administration of intravenous contrast. RADIATION  DOSE REDUCTION: This exam was performed according to the departmental dose-optimization program which includes automated exposure control, adjustment of the mA and/or kV according to patient size and/or use of iterative reconstruction technique. CONTRAST:  OMNIPAQUE IOHEXOL 350 MG/ML SOLN COMPARISON:  06/12/2021 FINDINGS: CTA CHEST FINDINGS Cardiovascular: Apparent filling defects within the right middle and lower lobar pulmonary arteries are artifactual and related to pulmonary vascular redistribution of setting of consolidation. Delayed imaging on CT examination of the abdomen pelvis demonstrates patency of these structures and no intraluminal filling defect identified to suggest pulmonary embolism. The central pulmonary arteries, however, are enlarged in keeping with changes of pulmonary arterial hypertension. Extensive multi-vessel coronary artery calcification. Extensive calcification of the aortic valve leaflets. Mild to moderate global cardiomegaly with left ventricular dilation. Left subclavian pacemaker in place with leads within the right atrium, right ventricle, and left ventricular venous outflow. No pericardial effusion. Moderate atherosclerotic calcification within the thoracic aorta. No aortic aneurysm. Mediastinum/Nodes: Visualized thyroid is unremarkable. No pathologic  thoracic adenopathy. Esophagus unremarkable. Lungs/Pleura: There is right-sided volume loss with elevation of the right hemidiaphragm and right middle and lower lobar collapse and consolidation. Superimposed extensive airway impaction within the right lower lobe and, to a lesser extent, right middle lobe and peribronchial nodularity is seen which may relate to superimposed changes aspiration or acute infection. Trace right pleural effusion. No pneumothorax. Musculoskeletal: No acute bone abnormality. No lytic or blastic bone lesion. Review of the MIP images confirms the above findings. CT ABDOMEN and PELVIS FINDINGS  Hepatobiliary: Cholelithiasis without pericholecystic inflammatory changes identified. Liver unremarkable. No intra or extrahepatic biliary ductal dilation. Pancreas: Unremarkable Spleen: Unremarkable Adrenals/Urinary Tract: The adrenal glands are unremarkable. The kidneys are mildly atrophic. Multiple simple cortical and parapelvic cysts are seen within the kidneys bilaterally for which no follow-up imaging is recommended. The kidneys are otherwise unremarkable. The bladder contains a 15 mm dependently layering calculus. The bladder is not distended. Stomach/Bowel: The stomach, small bowel, and large bowel are unremarkable. The appendix is absent. No free intraperitoneal gas or fluid. Vascular/Lymphatic: Extensive aortoiliac atherosclerotic calcification. Extensive visceral arteriosclerosis. No aortic aneurysm. No pathologic adenopathy within the abdomen and pelvis. Reproductive: Prostate is unremarkable. Other: No abdominal wall hernia. Diffuse subcutaneous body wall edema is present in keeping with changes of mild anasarca. Musculoskeletal: Osseous structures are age-appropriate. Diffuse osteopenia. No acute bone abnormality. No lytic or blastic bone lesion. Review of the MIP images confirms the above findings. IMPRESSION: 1. No pulmonary embolism. 2. Right-sided volume loss with elevation of the right hemidiaphragm and right middle and lower lobar collapse and consolidation. Superimposed extensive airway impaction within the right lower lobe and, to a lesser extent, right middle lobe and peribronchial nodularity which may relate to superimposed changes aspiration or acute infection. Trace right pleural effusion. 3. Mild to moderate global cardiomegaly with left ventricular dilation. Extensive multi-vessel coronary artery calcification. Morphologic changes in keeping with pulmonary arterial hypertension. 4. Cholelithiasis. 5. 15 mm dependently layering calculus within the bladder. 6. Mild anasarca. Aortic  Atherosclerosis (ICD10-I70.0). Electronically Signed   By: Spencer Coleman M.D.   On: 11/30/2022 22:15   CT ABDOMEN PELVIS W CONTRAST  Result Date: 12/08/2022 CLINICAL DATA:  Pulmonary embolism (PE) suspected, high prob RLE swelling, elevated trop, elevated LFTs, AMS, falls; Abdominal pain, acute, nonlocalized elevated LFTs and t bili EXAM: CT ANGIOGRAPHY CHEST CT ABDOMEN AND PELVIS WITH CONTRAST TECHNIQUE: Multidetector CT imaging of the chest was performed using the standard protocol during bolus administration of intravenous contrast. Multiplanar CT image reconstructions and MIPs were obtained to evaluate the vascular anatomy. Multidetector CT imaging of the abdomen and pelvis was performed using the standard protocol during bolus administration of intravenous contrast. RADIATION DOSE REDUCTION: This exam was performed according to the departmental dose-optimization program which includes automated exposure control, adjustment of the mA and/or kV according to patient size and/or use of iterative reconstruction technique. CONTRAST:  OMNIPAQUE IOHEXOL 350 MG/ML SOLN COMPARISON:  06/12/2021 FINDINGS: CTA CHEST FINDINGS Cardiovascular: Apparent filling defects within the right middle and lower lobar pulmonary arteries are artifactual and related to pulmonary vascular redistribution of setting of consolidation. Delayed imaging on CT examination of the abdomen pelvis demonstrates patency of these structures and no intraluminal filling defect identified to suggest pulmonary embolism. The central pulmonary arteries, however, are enlarged in keeping with changes of pulmonary arterial hypertension. Extensive multi-vessel coronary artery calcification. Extensive calcification of the aortic valve leaflets. Mild to moderate global cardiomegaly with left ventricular dilation. Left subclavian pacemaker in place with leads  within the right atrium, right ventricle, and left ventricular venous outflow. No pericardial  effusion. Moderate atherosclerotic calcification within the thoracic aorta. No aortic aneurysm. Mediastinum/Nodes: Visualized thyroid is unremarkable. No pathologic thoracic adenopathy. Esophagus unremarkable. Lungs/Pleura: There is right-sided volume loss with elevation of the right hemidiaphragm and right middle and lower lobar collapse and consolidation. Superimposed extensive airway impaction within the right lower lobe and, to a lesser extent, right middle lobe and peribronchial nodularity is seen which may relate to superimposed changes aspiration or acute infection. Trace right pleural effusion. No pneumothorax. Musculoskeletal: No acute bone abnormality. No lytic or blastic bone lesion. Review of the MIP images confirms the above findings. CT ABDOMEN and PELVIS FINDINGS Hepatobiliary: Cholelithiasis without pericholecystic inflammatory changes identified. Liver unremarkable. No intra or extrahepatic biliary ductal dilation. Pancreas: Unremarkable Spleen: Unremarkable Adrenals/Urinary Tract: The adrenal glands are unremarkable. The kidneys are mildly atrophic. Multiple simple cortical and parapelvic cysts are seen within the kidneys bilaterally for which no follow-up imaging is recommended. The kidneys are otherwise unremarkable. The bladder contains a 15 mm dependently layering calculus. The bladder is not distended. Stomach/Bowel: The stomach, small bowel, and large bowel are unremarkable. The appendix is absent. No free intraperitoneal gas or fluid. Vascular/Lymphatic: Extensive aortoiliac atherosclerotic calcification. Extensive visceral arteriosclerosis. No aortic aneurysm. No pathologic adenopathy within the abdomen and pelvis. Reproductive: Prostate is unremarkable. Other: No abdominal wall hernia. Diffuse subcutaneous body wall edema is present in keeping with changes of mild anasarca. Musculoskeletal: Osseous structures are age-appropriate. Diffuse osteopenia. No acute bone abnormality. No lytic or  blastic bone lesion. Review of the MIP images confirms the above findings. IMPRESSION: 1. No pulmonary embolism. 2. Right-sided volume loss with elevation of the right hemidiaphragm and right middle and lower lobar collapse and consolidation. Superimposed extensive airway impaction within the right lower lobe and, to a lesser extent, right middle lobe and peribronchial nodularity which may relate to superimposed changes aspiration or acute infection. Trace right pleural effusion. 3. Mild to moderate global cardiomegaly with left ventricular dilation. Extensive multi-vessel coronary artery calcification. Morphologic changes in keeping with pulmonary arterial hypertension. 4. Cholelithiasis. 5. 15 mm dependently layering calculus within the bladder. 6. Mild anasarca. Aortic Atherosclerosis (ICD10-I70.0). Electronically Signed   By: Spencer Coleman M.D.   On: 11/20/2022 22:15   CT Head Wo Contrast  Result Date: 11/26/2022 CLINICAL DATA:  Multiple falls EXAM: CT HEAD WITHOUT CONTRAST CT CERVICAL SPINE WITHOUT CONTRAST TECHNIQUE: Multidetector CT imaging of the head and cervical spine was performed following the standard protocol without intravenous contrast. Multiplanar CT image reconstructions of the cervical spine were also generated. RADIATION DOSE REDUCTION: This exam was performed according to the departmental dose-optimization program which includes automated exposure control, adjustment of the mA and/or kV according to patient size and/or use of iterative reconstruction technique. COMPARISON:  05/16/2022 CT head, no prior CT cervical spine. FINDINGS: CT HEAD FINDINGS Brain: No evidence of acute infarct, hemorrhage, mass, mass effect, or midline shift. No hydrocephalus or extra-axial fluid collection. Age related cerebral atrophy. Vascular: No hyperdense vessel. Atherosclerotic calcifications in the intracranial carotid and vertebral arteries. Skull: Negative for fracture or focal lesion. Sinuses/Orbits: No  acute finding. Chronic left lamina papyracea fracture. Other: The mastoid air cells are well aerated. CT CERVICAL SPINE FINDINGS Alignment: No traumatic listhesis. Trace anterolisthesis of to C5 on C6 and C7 on T1, which appears facet mediated. Dextrocurvature of the cervical spine may be positional. Skull base and vertebrae: No acute fracture or suspicious osseous lesion. Soft tissues and  spinal canal: No prevertebral fluid or swelling. No visible canal hematoma. Disc levels: Degenerative changes in the cervical spine.No high-grade spinal canal stenosis. Upper chest: No focal pulmonary opacity or pleural effusion. IMPRESSION: 1. No acute intracranial process. 2. No acute fracture or traumatic listhesis in the cervical spine. Electronically Signed   By: Spencer Coleman M.D.   On: 11/28/2022 19:51   CT CERVICAL SPINE WO CONTRAST  Result Date: 12/04/2022 CLINICAL DATA:  Multiple falls EXAM: CT HEAD WITHOUT CONTRAST CT CERVICAL SPINE WITHOUT CONTRAST TECHNIQUE: Multidetector CT imaging of the head and cervical spine was performed following the standard protocol without intravenous contrast. Multiplanar CT image reconstructions of the cervical spine were also generated. RADIATION DOSE REDUCTION: This exam was performed according to the departmental dose-optimization program which includes automated exposure control, adjustment of the mA and/or kV according to patient size and/or use of iterative reconstruction technique. COMPARISON:  05/16/2022 CT head, no prior CT cervical spine. FINDINGS: CT HEAD FINDINGS Brain: No evidence of acute infarct, hemorrhage, mass, mass effect, or midline shift. No hydrocephalus or extra-axial fluid collection. Age related cerebral atrophy. Vascular: No hyperdense vessel. Atherosclerotic calcifications in the intracranial carotid and vertebral arteries. Skull: Negative for fracture or focal lesion. Sinuses/Orbits: No acute finding. Chronic left lamina papyracea fracture. Other: The  mastoid air cells are well aerated. CT CERVICAL SPINE FINDINGS Alignment: No traumatic listhesis. Trace anterolisthesis of to C5 on C6 and C7 on T1, which appears facet mediated. Dextrocurvature of the cervical spine may be positional. Skull base and vertebrae: No acute fracture or suspicious osseous lesion. Soft tissues and spinal canal: No prevertebral fluid or swelling. No visible canal hematoma. Disc levels: Degenerative changes in the cervical spine.No high-grade spinal canal stenosis. Upper chest: No focal pulmonary opacity or pleural effusion. IMPRESSION: 1. No acute intracranial process. 2. No acute fracture or traumatic listhesis in the cervical spine. Electronically Signed   By: Spencer Coleman M.D.   On: 11/20/2022 19:51     ECHO as above  TELEMETRY reviewed by me 11/19/22: v-paced rate 100-120s  EKG reviewed by me 11/19/22: NSR rate 67 bpm  DATA reviewed by me 11/19/22: last 24h vitals tele labs imaging I/O ED provider note, admission H&P.    ASSESSMENT AND PLAN:  Active Problems:   Automatic implantable cardioverter-defibrillator in situ   Protein calorie malnutrition (HCC)   Acute on chronic combined systolic and diastolic CHF (congestive heart failure) (HCC)   Acute renal failure superimposed on stage 3a chronic kidney disease (HCC)   Cholelithiasis   Thrombocytopenia (HCC)   CAD (coronary artery disease)   Paroxysmal atrial fibrillation (HCC)   Physical deconditioning   Hypotension   Aspiration pneumonia (HCC)   Rhabdomyolysis   NSTEMI (non-ST elevated myocardial infarction) (HCC)   Frequent falls   Frailty   Elevated LFTs   Chronic anticoagulation    GUNNARR BELT is a 89 y.o. 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 Cardiology was consulted for further evaluation.      # Acute on chronic HFrEF (EF 20-25% - 05/2022) # Non-ischemic Cardiomyopathy  # S/p AICD (Ventricular Tachycardia s/p ICD shock, Bi V ICD (08/09/2007) # Demand ischemia # HX  Paroxsymal Atrial fibrillation  Patient came arrived at ED after multiple falls at home. Troponin 476 > 356. BNP > 4,500. EKG showed sinus rhythm at rate 67 bpm, ventricular paced. Patient denies shortness of breath or chest pain. Interrogation of ICD demonstrated episodes of AF (chronic), no episodes of VT/VF requiring shocks, evidence of progressive fluid overload via device fluid index.  -Increase IV lasix.  -Continue to hold home metoprolol. -Hold home amio due to elevated LFTs -Hold home Eliquis. Continue heparin gtt. Heparin managed per pharmacy.  -Strictly monitor I's & O's, daily weights, UOP -Maintain K > 4, Mg > 2.  -Agree with palliative care consults for GOC discussion. Patient's sister ready for hospice discussion. Cardiology will sign off at this time.   #Hypertension #Hyperlipidemia Upon admission, patients BP has been soft and slightly tachycardic. Currently, BP and HR are stable  -Hold home metoprolol -Hold home simvastatin 40 mg until LFTs improve.    # AKI on CKD 3a Upon admission, Cr 1.92 with GFR 33. Baseline Cr around 1.5.  -Monitor electrolytes and renal function with diuresis.  -Manager per primary    # Elevated LFTs   Upon admission, acute elevation of LFTs (AST 1,751, ALT 1,049) with LFTs normal in 05/2022.  -Hold home amio and statin due to elevated LFTs -Manager per primary    # Rhabdomyolysis Patient fell at home on night of 11/6. Patient laid on the ground throughout the night until EMS arrived to help patient up. Upon admission, CK 920.  -Manager per primary    # Thrombocytopenia Upon admission, platelets 75. Now at 39.  -Manager per  primary   Clotilde Dieter, DO 11/19/2022, 1:48 PM [facility]

## 2022-11-19 NOTE — Progress Notes (Signed)
  Interdisciplinary Goals of Care Family Meeting   Date carried out: 11/19/2022  Location of the meeting: Phone conference  Member's involved: Physician and Family Member or next of kin  Durable Power of Attorney or acting medical decision maker: Spencer Coleman and HPOA)    Discussion: We discussed goals of care for Spencer Coleman .    Code status:   Code Status: Limited: Do not attempt resuscitation (DNR) -DNR-LIMITED -Do Not Intubate/DNI    Disposition: Continue current acute care  Time spent for the meeting: 26 minutes    Lurene Shadow, MD  11/19/2022, 2:42 PM

## 2022-11-19 NOTE — Progress Notes (Signed)
PHARMACY - ANTICOAGULATION CONSULT NOTE  Pharmacy Consult for Heparin  Indication: chest pain/ACS  No Known Allergies  Patient Measurements: Height: 5\' 10"  (177.8 cm) Weight: 67.6 kg (149 lb) IBW/kg (Calculated) : 73 Heparin Dosing Weight: 46.7 kg   Vital Signs: Temp: 97.4 F (36.3 C) (11/09 1358) Temp Source: Axillary (11/09 1336) BP: 97/81 (11/09 1358) Pulse Rate: 117 (11/09 1358)  Labs: Recent Labs    11/14/2022 1404 11/18/2022 1718 11/13/2022 2249 11/18/22 0215 11/18/22 0454 11/18/22 1007 11/18/22 1420 11/18/22 2014 11/19/22 0623 11/19/22 1444  HGB 12.0*  --   --   --  12.4*  --   --   --  13.6  --   HCT 36.5*  --   --   --  38.8*  --   --   --  41.5  --   PLT 75*  --   --   --  70*  --   --   --  96*  --   APTT  --   --   --  40*  --  33  --  60*  --   --   LABPROT  --   --   --  18.3*  --   --   --   --   --   --   INR  --   --   --  1.5*  --   --   --   --   --   --   HEPARINUNFRC  --   --   --  0.27*  --   --    < > 0.30 0.22* 0.34  CREATININE 1.92*  --   --   --  1.71*  --   --   --  1.92*  --   CKTOTAL  --  920*  --   --  692*  --   --   --   --   --   TROPONINIHS  --  476* 356*  --   --   --   --   --   --   --    < > = values in this interval not displayed.    Estimated Creatinine Clearance: 24.5 mL/min (A) (by C-G formula based on SCr of 1.92 mg/dL (H)).   Medical History: Past Medical History:  Diagnosis Date   CAD (coronary artery disease)    Nonobstructive cardiac disease by cath   CHF (congestive heart failure) (HCC)    nonischemic cardiomyopathy   CKD (chronic kidney disease)    History of kidney stones    Hyperlipidemia    Hypertension    Inguinal hernia    PAF (paroxysmal atrial fibrillation) (HCC)    Patella fracture 01/09/2015   Renal colic 10/07/2011   Ventricular tachycardia (HCC)    s/p ICD    Medications:  Eliquis 2.5 mg twice daily Last dose  Assessment: 89 yo M with PMH Afib (on Eliquis), frequent falls, frailty, dementia,  HLD, HTN, CHF (EF 20-25%) due to DCM s/p AICD, CKD3 presents with ACS. His ECG exhibits atrial fibrillation and RBBB. Pharmacy consulted to dose heparin. Notably patient takes apixaban 2.5 mg twice daily prior to admission (with recent dispense history) however baseline heparin level does not exhibit false elevation, so will titrate by antiXa level going forward. Also, patient with low baseline platelet level.  Date Time aPTT/HL  Rate/Comment 11/08 0215 40 s and HL 0.27 Subtherapeutic and correlating 11/08 1007 33 s    Subtherapeutic  11/08 1420 HL 0.31  Irrelevant lab (ordered as add-on to 1007; collected as new draw) 11/08 2014 HL 0.30  Therapeutic, but on the lower end 11/09   0623    HL  0.22                     SUBtherapeutic  11/9 1444 HL 0.34  Therapeutic x1      Baseline Labs: aPTT - 40; antiXa - 0.27; INR - 1.5; Hgb - 12.0; Plts - 75   Goal of Therapy:  Heparin level 0.3-0.7 units/ml aPTT 66 -102  seconds Monitor platelets by anticoagulation protocol: Yes   Plan:  11/9 1444 HL 0.34   Therapeutic x1 - Will continue drip rate at 950 units/hr Check confirmatory antiXa level in 8 hours  Continue to monitor CBC daily while on heparin infusion.  Berel Najjar A Clinical Pharmacist 11/19/2022 3:11 PM

## 2022-11-19 NOTE — ED Notes (Signed)
Pt adamantly refused to eat breakfast when offered despite assistance. Pt resting comfortably, NAD noted.

## 2022-11-19 NOTE — ED Notes (Signed)
ED TO INPATIENT HANDOFF REPORT  ED Nurse Name and Phone #: Virgilio Belling Name/Age/Gender Spencer Coleman 89 y.o. male Room/Bed: ED34A/ED34A  Code Status   Code Status: Limited: Do not attempt resuscitation (DNR) -DNR-LIMITED -Do Not Intubate/DNI   Home/SNF/Other Home Patient oriented to: self Is this baseline? Yes   Triage Complete: Triage complete  Chief Complaint CHF exacerbation [name]) [I50.9]  Triage Note Pt arrived via EMS from home d/t multiple falls today. Pt lives at home by himself and uses a walker to get around.   Allergies No Known Allergies  Level of Care/Admitting Diagnosis ED Disposition     ED Disposition  Admit   Condition  --   Comment  Hospital Area: [facility] [[id number]]  Level of Care: Stepdown [14]  Covid Evaluation: Asymptomatic - no recent exposure (last 10 days) testing not required  Diagnosis: CHF exacerbation [facility]) [[id number]]  Admitting Physician: Andris Baumann [[id number]]  Attending Physician: Andris Baumann [[id number]]  Certification:: I certify this patient will need inpatient services for at least 2 midnights  Expected Medical Readiness: 11/29/2022          B Medical/Surgery History Past Medical History:  Diagnosis Date   CAD (coronary artery disease)    Nonobstructive cardiac disease by cath   CHF (congestive heart failure) (HCC)    nonischemic cardiomyopathy   CKD (chronic kidney disease)    History of kidney stones    Hyperlipidemia    Hypertension    Inguinal hernia    PAF (paroxysmal atrial fibrillation) (HCC)    Patella fracture 01/09/2015   Renal colic 10/07/2011   Ventricular tachycardia [facility])    s/p ICD   Past Surgical History:  Procedure Laterality Date   CARDIAC CATHETERIZATION     CARDIAC DEFIBRILLATOR PLACEMENT  2009   CYSTOSCOPY WITH LITHOLAPAXY N/A 02/21/2018   Procedure: CYSTOSCOPY WITH LITHOLAPAXY;  Surgeon: Sondra Come, MD;  Location: [facility] ORS;  Service: Urology;  Laterality:  N/A;   ELBOW SURGERY Left    HOLMIUM LASER APPLICATION N/A 02/21/2018   Procedure: HOLMIUM LASER APPLICATION;  Surgeon: Sondra Come, MD;  Location: [facility] ORS;  Service: Urology;  Laterality: N/A;   ICD GENERATOR CHANGEOUT N/A 01/28/2021   Procedure: ICD GENERATOR CHANGEOUT;  Surgeon: Marcina Millard, MD;  Location: [facility] INVASIVE CV LAB;  Service: Cardiovascular;  Laterality: N/A;   INGUINAL HERNIA REPAIR     LITHOTRIPSY     ORIF left wrist fracture Left    ORIF PATELLA Left 01/10/2015   Procedure: OPEN REDUCTION INTERNAL (ORIF) FIXATION PATELLA;  Surgeon: Donato Heinz, MD;  Location: [facility] ORS;  Service: Orthopedics;  Laterality: Left;   ORIF PATELLA Left 02/06/2015   Procedure: OPEN REDUCTION INTERNAL (ORIF) FIXATION PATELLA;  Surgeon: Donato Heinz, MD;  Location: [facility] ORS;  Service: Orthopedics;  Laterality: Left;   TRANSURETHRAL RESECTION OF PROSTATE N/A 02/21/2018   Procedure: TRANSURETHRAL RESECTION OF THE PROSTATE (TURP);  Surgeon: Sondra Come, MD;  Location: [facility] ORS;  Service: Urology;  Laterality: N/A;     A IV Location/Drains/Wounds Patient Lines/Drains/Airways Status     Active Line/Drains/Airways     Name Placement date Placement time Site Days   Peripheral IV 11/18/22 20 G Anterior;Distal;Right;Upper Arm 11/18/22  2030  Arm  1   Peripheral IV 11/19/22 20 G Left;Posterior Hand 11/19/22  0700  Hand  less than 1   External Urinary Catheter 11/18/2022  --  --  2   Wound / Incision (  Open or Dehisced) 05/16/22 Skin tear Arm Lower;Posterior;Proximal;Right small, bleeding skin tear 05/16/22  1100  Arm  187   Wound / Incision (Open or Dehisced) 05/25/22 Skin tear Wrist Posterior;Right 05/25/22  1553  Wrist  178            Intake/Output Last 24 hours  Intake/Output Summary (Last 24 hours) at 11/19/2022 1247 Last data filed at 11/19/2022 0228 Gross per 24 hour  Intake 100.7 ml  Output 25 ml  Net 75.7 ml    Labs/Imaging Results for orders placed or performed  during the hospital encounter of 11/14/2022 (from the past 48 hour(s))  CBC with Differential     Status: Abnormal   Collection Time: 11/23/2022  2:04 PM  Result Value Ref Range   WBC 8.9 4.0 - 10.5 K/uL   RBC 3.88 (L) 4.22 - 5.81 MIL/uL   Hemoglobin 12.0 (L) 13.0 - 17.0 g/dL   HCT 47.8 (L) 29.5 - 62.1 %   MCV 94.1 80.0 - 100.0 fL   MCH 30.9 26.0 - 34.0 pg   MCHC 32.9 30.0 - 36.0 g/dL   RDW 30.8 65.7 - 84.6 %   Platelets 75 (L) 150 - 400 K/uL    Comment: Immature Platelet Fraction may be clinically indicated, consider ordering this additional test [id number] PLATELET COUNT CONFIRMED BY SMEAR    nRBC 0.0 0.0 - 0.2 %   Neutrophils Relative % 87 %   Neutro Abs 7.8 (H) 1.7 - 7.7 K/uL   Lymphocytes Relative 8 %   Lymphs Abs 0.7 0.7 - 4.0 K/uL   Monocytes Relative 4 %   Monocytes Absolute 0.3 0.1 - 1.0 K/uL   Eosinophils Relative 0 %   Eosinophils Absolute 0.0 0.0 - 0.5 K/uL   Basophils Relative 0 %   Basophils Absolute 0.0 0.0 - 0.1 K/uL   WBC Morphology MORPHOLOGY UNREMARKABLE    RBC Morphology MORPHOLOGY UNREMARKABLE    Smear Review Normal platelet morphology    Immature Granulocytes 1 %   Abs Immature Granulocytes 0.05 0.00 - 0.07 K/uL    Comment: Performed at [facility], [address] [place], [place] [address]  Basic metabolic panel     Status: Abnormal   Collection Time: 11/16/2022  2:04 PM  Result Value Ref Range   Sodium 133 (L) 135 - 145 mmol/L   Potassium 4.4 3.5 - 5.1 mmol/L   Chloride 104 98 - 111 mmol/L   CO2 20 (L) 22 - 32 mmol/L   Glucose, Bld 96 70 - 99 mg/dL    Comment: Glucose reference range applies only to samples taken after fasting for at least 8 hours.   BUN 37 (H) 8 - 23 mg/dL   Creatinine, Ser 0.10 (H) 0.61 - 1.24 mg/dL   Calcium 8.2 (L) 8.9 - 10.3 mg/dL   GFR, Estimated 33 (L) >60 mL/min    Comment: (NOTE) Calculated using the CKD-EPI Creatinine Equation (2021)    Anion gap 9 5 - 15    Comment: Performed at [facility], [address] [place], [place] [address]  Brain natriuretic peptide     Status: Abnormal   Collection Time: 11/18/2022  2:04 PM  Result Value Ref Range   B Natriuretic Peptide >4,500.0 (H) 0.0 - 100.0 pg/mL    Comment: Performed at [facility], [address] [place], [place] [address]  Urinalysis, w/ Reflex to Culture (Infection Suspected) -Urine, Clean Catch     Status: Abnormal   Collection Time: 11/19/2022  5:17 PM  Result Value Ref Range   Specimen Source URINE, CLEAN CATCH    Color, Urine AMBER (A) YELLOW    Comment: BIOCHEMICALS MAY BE AFFECTED BY COLOR   APPearance CLOUDY (A) CLEAR   Specific Gravity, Urine 1.041 (H) 1.005 - 1.030   pH 5.0 5.0 - 8.0   Glucose, UA NEGATIVE NEGATIVE mg/dL   Hgb urine dipstick LARGE (A) NEGATIVE   Bilirubin Urine NEGATIVE NEGATIVE   Ketones, ur NEGATIVE NEGATIVE mg/dL   Protein, ur 161 (A) NEGATIVE mg/dL   Nitrite NEGATIVE NEGATIVE   Leukocytes,Ua NEGATIVE NEGATIVE   RBC / HPF 6-10 0 - 5 RBC/hpf   WBC, UA 6-10 0 - 5 WBC/hpf    Comment:        Reflex urine culture not performed if WBC <=10, OR if Squamous epithelial cells >5. If Squamous epithelial cells >5 suggest recollection.    Bacteria, UA RARE (A) NONE SEEN   Squamous Epithelial / HPF 0-5 0 - 5 /HPF   Mucus PRESENT    Granular Casts, UA PRESENT     Comment: Performed at [facility], [address] [place], [place] [address]  CK     Status: Abnormal   Collection Time: 11/15/2022  5:18 PM  Result Value Ref Range   Total CK 920 (H) 49 - 397 U/L    Comment: Performed at [facility], [address] [place], [place] [address]  Hepatic function panel     Status: Abnormal   Collection Time: 11/24/2022  5:18 PM  Result Value Ref Range   Total Protein 5.9 (L) 6.5 - 8.1 g/dL   Albumin 3.4 (L) 3.5 - 5.0 g/dL   AST 1,191 (H) 15 - 41 U/L   ALT 1,049 (H) 0 - 44 U/L   Alkaline Phosphatase 134 (H) 38 - 126 U/L   Total Bilirubin 2.6 (H) <1.2 mg/dL   Bilirubin, Direct  0.8 (H) 0.0 - 0.2 mg/dL   Indirect Bilirubin 1.8 (H) 0.3 - 0.9 mg/dL    Comment: Performed at [facility], [address] [place], [place] [address]  Troponin I (High Sensitivity)     Status: Abnormal   Collection Time: 12/03/2022  5:18 PM  Result Value Ref Range   Troponin I (High Sensitivity) 476 (HH) <18 ng/L    Comment: CRITICAL RESULT CALLED TO, READ BACK BY AND VERIFIED WITH ERENA GABORIAULT AT 1825 ON 11/18/2022 BY SS (NOTE) Elevated high sensitivity troponin I (hsTnI) values and significant  changes across serial measurements may suggest ACS but many other  chronic and acute conditions are known to elevate hsTnI results.  Refer to the "Links" section for chest pain algorithms and additional  guidance. Performed at [facility], [address] [place], [place] [address]   Ammonia     Status: None   Collection Time: 12/01/2022  6:44 PM  Result Value Ref Range   Ammonia 11 9 - 35 umol/L    Comment: Performed at [facility], [address] [place], [place] [address]  Blood gas, venous     Status: Abnormal   Collection Time: 12/07/2022  8:47 PM  Result Value Ref Range   pH, Ven 7.32 7.25 - 7.43    Comment: CORRECTED ON 11/08 AT 1206: PREVIOUSLY REPORTED AS 7.29   pCO2, Ven 56 44 - 60 mmHg    Comment: CORRECTED ON 11/08 AT 1206: PREVIOUSLY REPORTED AS 47   pO2, Ven 34 32 - 45 mmHg   Bicarbonate  28.9 (H) 20.0 - 28.0 mmol/L    Comment: CORRECTED ON 11/08 AT 1206: PREVIOUSLY REPORTED AS 22.6   Acid-Base Excess 1.6 0.0 - 2.0 mmol/L   O2 Saturation 25.1 %    Comment: CORRECTED ON 11/08 AT 1206: PREVIOUSLY REPORTED AS 46.7   Patient temperature 37.0    Collection site VEIN     Comment: Performed at [facility], [address] [place], [place] [address]  Troponin I (High Sensitivity)     Status: Abnormal   Collection Time: 12/01/2022 10:49 PM  Result Value Ref Range   Troponin I (High Sensitivity) 356 (HH) <18 ng/L    Comment: CRITICAL VALUE  NOTED. VALUE IS CONSISTENT WITH PREVIOUSLY REPORTED/CALLED VALUE ASW (NOTE) Elevated high sensitivity troponin I (hsTnI) values and significant  changes across serial measurements may suggest ACS but many other  chronic and acute conditions are known to elevate hsTnI results.  Refer to the "Links" section for chest pain algorithms and additional  guidance. Performed at [facility], [address] [place], [place] [address]   Blood culture (routine x 2)     Status: None (Preliminary result)   Collection Time: 12/10/2022 10:49 PM   Specimen: BLOOD  Result Value Ref Range   Specimen Description BLOOD RIGHT FOREARM    Special Requests      BOTTLES DRAWN AEROBIC AND ANAEROBIC Blood Culture adequate volume   Culture      NO GROWTH 2 DAYS Performed at [facility], [address], [place], [place] [address]    Report Status PENDING   Blood culture (routine x 2)     Status: None (Preliminary result)   Collection Time: 12/10/2022 10:49 PM   Specimen: BLOOD  Result Value Ref Range   Specimen Description BLOOD LEFT FOREARM    Special Requests      BOTTLES DRAWN AEROBIC AND ANAEROBIC Blood Culture results may not be optimal due to an inadequate volume of blood received in culture bottles   Culture      NO GROWTH 2 DAYS Performed at [facility], [address] [place], [place] [address]    Report Status PENDING   Lactic acid, plasma     Status: None   Collection Time: 11/19/2022 10:49 PM  Result Value Ref Range   Lactic Acid, Venous 1.8 0.5 - 1.9 mmol/L    Comment: Performed at [facility], [address] [place], [place] [address]  APTT     Status: Abnormal   Collection Time: 11/18/22  2:15 AM  Result Value Ref Range   aPTT 40 (H) 24 - 36 seconds    Comment:        IF BASELINE aPTT IS ELEVATED, SUGGEST PATIENT RISK ASSESSMENT BE USED TO DETERMINE APPROPRIATE ANTICOAGULANT THERAPY. Performed at [facility], [address]  [place], [place] [address]   Heparin level (unfractionated)     Status: Abnormal   Collection Time: 11/18/22  2:15 AM  Result Value Ref Range   Heparin Unfractionated 0.27 (L) 0.30 - 0.70 IU/mL    Comment: (NOTE) The clinical reportable range upper limit is being lowered to >1.10 to align with the FDA approved guidance for the current laboratory assay.  If heparin results are below expected values, and patient dosage has  been confirmed, suggest follow up testing of antithrombin III levels. Performed at [facility], [address] [place], [place] [address]   Protime-INR     Status: Abnormal   Collection Time: 11/18/22  2:15 AM  Result Value Ref Range   Prothrombin Time 18.3 (H) 11.4 - 15.2 seconds   INR 1.5 (H) 0.8 - 1.2    Comment: (NOTE) INR goal varies based on device and disease states. Performed at [facility], [address] [place], [place] [address]   Strep pneumoniae urinary antigen     Status: None   Collection Time: 11/18/22  4:54 AM  Result Value Ref Range   Strep Pneumo Urinary Antigen NEGATIVE NEGATIVE    Comment:        Infection due to S. pneumoniae cannot be absolutely ruled out since the antigen present may be below the detection limit of the test. Performed at [facility], [address] [place], [place] [address]   Comprehensive metabolic panel     Status: Abnormal   Collection Time: 11/18/22  4:54 AM  Result Value Ref Range   Sodium 136 135 - 145 mmol/L   Potassium 4.6 3.5 - 5.1 mmol/L   Chloride 102 98 - 111 mmol/L   CO2 26 22 - 32 mmol/L   Glucose, Bld 87 70 - 99 mg/dL    Comment: Glucose reference range applies only to samples taken after fasting for at least 8 hours.   BUN 37 (H) 8 - 23 mg/dL   Creatinine, Ser 5.62 (H) 0.61 - 1.24 mg/dL   Calcium 8.3 (L) 8.9 - 10.3 mg/dL   Total Protein 5.2 (L) 6.5 - 8.1 g/dL   Albumin 3.0 (L) 3.5 - 5.0 g/dL   AST 1,308 (H) 15 - 41 U/L   ALT 1,433 (H) 0 - 44 U/L   Alkaline  Phosphatase 109 38 - 126 U/L   Total Bilirubin 1.8 (H) <1.2 mg/dL   GFR, Estimated 38 (L) >60 mL/min    Comment: (NOTE) Calculated using the CKD-EPI Creatinine Equation (2021)    Anion gap 8 5 - 15    Comment: Performed at [facility], [address] [place], [place] [address]  CBC     Status: Abnormal   Collection Time: 11/18/22  4:54 AM  Result Value Ref Range   WBC 7.7 4.0 - 10.5 K/uL   RBC 4.10 (L) 4.22 - 5.81 MIL/uL   Hemoglobin 12.4 (L) 13.0 - 17.0 g/dL   HCT 69.6 (L) 29.5 - 28.4 %   MCV 94.6 80.0 - 100.0 fL   MCH 30.2 26.0 - 34.0 pg   MCHC 32.0 30.0 - 36.0 g/dL   RDW 13.2 44.0 - 10.2 %   Platelets 70 (L) 150 - 400 K/uL    Comment: Immature Platelet Fraction may be clinically indicated, consider ordering this additional test [id number]    nRBC 0.0 0.0 - 0.2 %    Comment: Performed at [facility], [address] [place], [place] [address]  Magnesium     Status: None   Collection Time: 11/18/22  4:54 AM  Result Value Ref Range   Magnesium 2.1 1.7 - 2.4 mg/dL    Comment: Performed at [facility], [address] [place], [place] [address]  CK     Status: Abnormal   Collection Time: 11/18/22  4:54 AM  Result Value Ref Range   Total CK 692 (H) 49 - 397 U/L    Comment: Performed at [facility], [address] [place], [place] [address]  APTT     Status: None   Collection Time: 11/18/22 10:07 AM  Result Value Ref Range   aPTT 33 24 -  36 seconds    Comment: Performed at [facility], [address] [place], [place] [address]  Heparin level (unfractionated)     Status: None   Collection Time: 11/18/22  2:20 PM  Result Value Ref Range   Heparin Unfractionated 0.31 0.30 - 0.70 IU/mL    Comment: (NOTE) The clinical reportable range upper limit is being lowered to >1.10 to align with the FDA approved guidance for the current laboratory assay.  If heparin results are below expected values, and patient dosage has  been  confirmed, suggest follow up testing of antithrombin III levels. Performed at [facility], [address] [place], [place] [address]   APTT     Status: Abnormal   Collection Time: 11/18/22  8:14 PM  Result Value Ref Range   aPTT 60 (H) 24 - 36 seconds    Comment:        IF BASELINE aPTT IS ELEVATED, SUGGEST PATIENT RISK ASSESSMENT BE USED TO DETERMINE APPROPRIATE ANTICOAGULANT THERAPY. Performed at [facility], [address] [place], [place] [address]   Heparin level (unfractionated)     Status: None   Collection Time: 11/18/22  8:14 PM  Result Value Ref Range   Heparin Unfractionated 0.30 0.30 - 0.70 IU/mL    Comment: (NOTE) The clinical reportable range upper limit is being lowered to >1.10 to align with the FDA approved guidance for the current laboratory assay.  If heparin results are below expected values, and patient dosage has  been confirmed, suggest follow up testing of antithrombin III levels. Performed at [facility], [address] [place], [place] [address]   Heparin level (unfractionated)     Status: Abnormal   Collection Time: 11/19/22  6:23 AM  Result Value Ref Range   Heparin Unfractionated 0.22 (L) 0.30 - 0.70 IU/mL    Comment: (NOTE) The clinical reportable range upper limit is being lowered to >1.10 to align with the FDA approved guidance for the current laboratory assay.  If heparin results are below expected values, and patient dosage has  been confirmed, suggest follow up testing of antithrombin III levels. Performed at [facility], [address] [place], [place] [address]   Comprehensive metabolic panel     Status: Abnormal   Collection Time: 11/19/22  6:23 AM  Result Value Ref Range   Sodium 133 (L) 135 - 145 mmol/L   Potassium 3.9 3.5 - 5.1 mmol/L   Chloride 98 98 - 111 mmol/L   CO2 25 22 - 32 mmol/L   Glucose, Bld 114 (H) 70 - 99 mg/dL    Comment: Glucose reference range applies only to  samples taken after fasting for at least 8 hours.   BUN 38 (H) 8 - 23 mg/dL   Creatinine, Ser 1.30 (H) 0.61 - 1.24 mg/dL   Calcium 8.2 (L) 8.9 - 10.3 mg/dL   Total Protein 5.1 (L) 6.5 - 8.1 g/dL   Albumin 2.8 (L) 3.5 - 5.0 g/dL   AST 865 (H) 15 - 41 U/L   ALT 1,148 (H) 0 - 44 U/L   Alkaline Phosphatase 105 38 - 126 U/L   Total Bilirubin 1.6 (H) <1.2 mg/dL   GFR, Estimated 33 (L) >60 mL/min    Comment: (NOTE) Calculated using the CKD-EPI Creatinine Equation (2021)    Anion gap 10 5 - 15    Comment: Performed at [facility], [address], [place], [place] [address]  CBC     Status: Abnormal  Collection Time: 11/19/22  6:23 AM  Result Value Ref Range   WBC 6.6 4.0 - 10.5 K/uL   RBC 4.47 4.22 - 5.81 MIL/uL   Hemoglobin 13.6 13.0 - 17.0 g/dL   HCT 56.4 33.2 - 95.1 %   MCV 92.8 80.0 - 100.0 fL   MCH 30.4 26.0 - 34.0 pg   MCHC 32.8 30.0 - 36.0 g/dL   RDW 88.4 16.6 - 06.3 %   Platelets 96 (L) 150 - 400 K/uL   nRBC 0.0 0.0 - 0.2 %    Comment: Performed at [facility], [address] [place], [place] [address]   ECHOCARDIOGRAM COMPLETE  Result Date: 11/18/2022    ECHOCARDIOGRAM REPORT   Patient Name:   Spencer Coleman Date of Exam: 11/18/2022 Medical Rec #:  [id number]    Height:       70.0 in Accession #:    [id number]   Weight:       103.0 lb Date of Birth:  August 27, 1930     BSA:          1.574 m Patient Age:    89 years     BP:           101/68 mmHg Patient Gender: M            HR:           82 bpm. Exam Location:  [facility] Procedure: 2D Echo, Cardiac Doppler and Color Doppler Indications:     CHF  History:         Patient has prior history of Echocardiogram examinations, most                  recent 05/13/2022. CHF, CAD, Pacemaker and Defibrillator,                  Arrythmias:Atrial Fibrillation and Tachycardia; Risk                  Factors:Hypertension and Dyslipidemia. NSTEMI, CKD.  Sonographer:     Mikki Harbor Referring Phys:  [id number] Andris Baumann Diagnosing Phys:  Debbe Odea MD IMPRESSIONS  1. Left ventricular ejection fraction, by estimation, is 20 to 25%. The left ventricle has severely decreased function. The left ventricle demonstrates global hypokinesis. The left ventricular internal cavity size was severely dilated. Left ventricular diastolic parameters are indeterminate.  2. Right ventricular systolic function is low normal. The right ventricular size is mildly enlarged. There is normal pulmonary artery systolic pressure.  3. Left atrial size was severely dilated.  4. The mitral valve is normal in structure. Moderate to severe mitral valve regurgitation.  5. The aortic valve is calcified. Aortic valve regurgitation is moderate. Aortic valve sclerosis/calcification is present, without any evidence of aortic stenosis.  6. The inferior vena cava is dilated in size with <50% respiratory variability, suggesting right atrial pressure of 15 mmHg. FINDINGS  Left Ventricle: Left ventricular ejection fraction, by estimation, is 20 to 25%. The left ventricle has severely decreased function. The left ventricle demonstrates global hypokinesis. The left ventricular internal cavity size was severely dilated. There is no left ventricular hypertrophy. Left ventricular diastolic parameters are indeterminate. Right Ventricle: The right ventricular size is mildly enlarged. No increase in right ventricular wall thickness. Right ventricular systolic function is low normal. There is normal pulmonary artery systolic pressure. The tricuspid regurgitant velocity is 2.11 m/s, and with an assumed right atrial pressure of 15 mmHg, the estimated right ventricular systolic pressure is 32.8 mmHg.  Left Atrium: Left atrial size was severely dilated. Right Atrium: Right atrial size was normal in size. Pericardium: There is no evidence of pericardial effusion. Mitral Valve: The mitral valve is normal in structure. Moderate to severe mitral valve regurgitation. MV peak gradient, 3.6 mmHg. The mean  mitral valve gradient is 1.0 mmHg. Tricuspid Valve: The tricuspid valve is normal in structure. Tricuspid valve regurgitation is mild. Aortic Valve: The aortic valve is calcified. Aortic valve regurgitation is moderate. Aortic valve sclerosis/calcification is present, without any evidence of aortic stenosis. Aortic valve mean gradient measures 6.0 mmHg. Aortic valve peak gradient measures 12.1 mmHg. Aortic valve area, by VTI measures 1.91 cm. Pulmonic Valve: The pulmonic valve was normal in structure. Pulmonic valve regurgitation is mild to moderate. Aorta: The aortic root is normal in size and structure. Venous: The inferior vena cava is dilated in size with less than 50% respiratory variability, suggesting right atrial pressure of 15 mmHg. IAS/Shunts: No atrial level shunt detected by color flow Doppler. Additional Comments: A device lead is visualized.  LEFT VENTRICLE PLAX 2D LVIDd:         7.10 cm LVIDs:         6.40 cm LV PW:         0.90 cm LV IVS:        0.90 cm LVOT diam:     2.00 cm LV SV:         62 LV SV Index:   39 LVOT Area:     3.14 cm  LV Volumes (MOD) LV vol d, MOD A2C: 209.0 ml LV vol d, MOD A4C: 202.0 ml LV vol s, MOD A2C: 129.0 ml LV vol s, MOD A4C: 160.0 ml LV SV MOD A2C:     80.0 ml LV SV MOD A4C:     202.0 ml LV SV MOD BP:      54.4 ml RIGHT VENTRICLE RV Basal diam:  4.20 cm RV Mid diam:    3.40 cm RV S prime:     12.30 cm/s LEFT ATRIUM              Index        RIGHT ATRIUM           Index LA diam:        5.70 cm  3.62 cm/m   RA Area:     15.50 cm LA Vol (A2C):   130.0 ml 82.57 ml/m  RA Volume:   42.30 ml  26.87 ml/m LA Vol (A4C):   101.0 ml 64.15 ml/m LA Biplane Vol: 116.0 ml 73.68 ml/m  AORTIC VALVE                     PULMONIC VALVE AV Area (Vmax):    1.86 cm      PV Vmax:       0.88 m/s AV Area (Vmean):   2.07 cm      PV Peak grad:  3.1 mmHg AV Area (VTI):     1.91 cm AV Vmax:           174.00 cm/s AV Vmean:          107.900 cm/s AV VTI:            0.323 m AV Peak Grad:       12.1 mmHg AV Mean Grad:      6.0 mmHg LVOT Vmax:         103.10 cm/s LVOT Vmean:  71.050 cm/s LVOT VTI:          0.197 m LVOT/AV VTI ratio: 0.61  AORTA Ao Root diam: 3.30 cm MITRAL VALVE               TRICUSPID VALVE MV Area (PHT): 4.60 cm    TR Peak grad:   17.8 mmHg MV Area VTI:   2.11 cm    TR Vmax:        211.00 cm/s MV Peak grad:  3.6 mmHg MV Mean grad:  1.0 mmHg    SHUNTS MV Vmax:       0.95 m/s    Systemic VTI:  0.20 m MV Vmean:      51.1 cm/s   Systemic Diam: 2.00 cm MV Decel Time: 165 msec MR Peak grad: 108.2 mmHg MR Mean grad: 61.0 mmHg MR Vmax:      520.00 cm/s MR Vmean:     360.0 cm/s MV E velocity: 74.30 cm/s MV A velocity: 84.20 cm/s MV E/A ratio:  0.88 Debbe Odea MD Electronically signed by Debbe Odea MD Signature Date/Time: 11/18/2022/4:47:44 PM    Final    DG Swallowing Func-Speech Pathology  Result Date: 11/18/2022 Table formatting from the original result was not included. Modified Barium Swallow Study Patient Details Name: QUINCE LAMENDOLA MRN: [id number] Date of Birth: April 19, 1930 Today's Date: 11/18/2022 HPI/PMH: Pt is a 89 y.o. male past medical history significant for Dementia, paroxysmal atrial fibrillation on Eliquis, ventricular tachycardia with ICD, hyperlipidemia, hypertension, CHF with reduced EF, presents to the emergency department following a fall.  History is provided by the patient's sister at bedside.  States that he had a fall yesterday out of bed and was unable to get up.  States that he has had falls in the past but has always been able to get up on his own.  Laid on the ground throughout the night.  EMS assisted him up this morning.  Again had another fall later this afternoon.  Pt lives at home Alone.  CT of Chest: Right-sided volume loss with elevation of the right hemidiaphragm  and right middle and lower lobar collapse and consolidation.  Superimposed extensive airway impaction within the right lower lobe  and, to a lesser extent, right middle lobe and  peribronchial  nodularity which may relate to superimposed changes aspiration or  acute infection. Trace right pleural effusion.  3. Mild to moderate global cardiomegaly with left ventricular  dilation. Extensive multi-vessel coronary artery calcification.  Morphologic changes in keeping with pulmonary arterial hypertension.  Head CT: No evidence of acute infarct, hemorrhage, mass, mass effect,  or midline shift. No hydrocephalus or extra-axial fluid collection.  Age related cerebral atrophy. Clinical Impression Patient presents with functional oropharyngeal swallowing for Age. NO aspiration nor laryngeal penetration noted w/ any consistency. Pt fed self w/ setup support. Intermittent verbal cues given; baseline Cognitive decline/Dementia dx per chart. Oral stage is characterized by adequate lip closure, but min disorganized bolus preparation and containment followed by min decreased control of liquid boluses during anterior to posterior transit. Trace+ oral residue noted diffusely in oral cavity on tongue and upper denture plate; a f/u Dry swallow cleared this residue. Min increased mastication time/effort noted w/ soft solid d/t impact of Lacking Bottom Dentition (upper denture plate only).  Swallow initiation occurs at the level of the valleculae w/ majority of trials; thin liquids spilled inconsistently from the valleculae. Pharyngeal stage is noted for mildly reduced tongue base retraction (likely age-related), adequate hyolaryngeal excursion, and adequate  pharyngeal constriction. Epiglottic deflection is complete; there is NO penetration nor aspiration. There is trace-min BOT, valleculae and intermittently aeryepiglottic folds/pyriform sinus residue which cleared w/ f/u Dry swallow. Pharyngeal stripping wave is complete. Amplitude/duration of cricopharyngeus opening is WFL. There is adequate/complete clearance through the cervical esophagus. No retrograde activity noted in the cervical esophagus.   Consistencies tested were thin liquids x2 tsps, 3 cup sip, 2-3 sequential sips slowly, nectar x1 tsp, 1 cup sip, honey x1 tsp, pudding x1 tsp, regular solid (1/2 graham cracker with pudding). Would recommend Pills given in a Puree for the increased viscosity and d/t advanced Age of pt. Recommend continue a farily regular/mech soft diet consistency with thin liquids VIA CUP; general aspiration precautions including SMALL, SINGLE sips SLOWLY and moistening meats/foods for easier mastication. Recommend a Dry swallow after bites and sips -- BUT pt already does a Dry swallow independently.  No further ST services indicated.  IF it is felt that he could have REFLUX activty contributing to risk for aspiration of retrograde, Reflux activity, the recommend f/u w/ GI for assessment.  MD and NSG made aware of the MBSS results and recommendations. Factors that may increase risk of adverse event in presence of aspiration Spencer Coleman 2021): Factors that may increase risk of adverse event in presence of aspiration Spencer Coleman 2021): Poor general health and/or compromised immunity; Reduced cognitive function; Frail or deconditioned (Lacking Bottom Dentition) Recommendations/Plan: Swallowing Evaluation Recommendations Swallowing Evaluation Recommendations Recommendations: PO diet PO Diet Recommendation: Dysphagia 3 (Mechanical soft); Thin liquids (Level 0) Liquid Administration via: Cup; No straw Medication Administration: Whole meds with puree (vs Crushed) Supervision: Patient able to self-feed; Set-up assistance for safety (monitor for needs) Swallowing strategies  : Minimize environmental distractions; Slow rate; Small bites/sips; Multiple dry swallows after each bite/sip Postural changes: Position pt fully upright for meals; Stay upright 30-60 min after meals Oral care recommendations: Oral care BID (2x/day); Pt independent with oral care (staff support) Recommended consults: -- (Dietician; Palliative Care for GOC)  Treatment Plan Treatment Plan Treatment recommendations: No treatment recommended at this time Follow-up recommendations: No SLP follow up Recommendations Comment: appears at his baseline Functional status assessment: Patient has not had a recent decline in their functional status. Treatment frequency: -- (n/a) Treatment duration: -- (n/a) Interventions: Aspiration precaution training; Patient/family education Recommendations Recommendations for follow up therapy are one component of a multi-disciplinary discharge planning process, led by the attending physician.  Recommendations may be updated based on patient status, additional functional criteria and insurance authorization. Assessment: Orofacial Exam: Orofacial Exam Oral Cavity: Oral Hygiene: WFL Oral Cavity - Dentition: Edentulous; Dentures, top (on bottom) Orofacial Anatomy: WFL Oral Motor/Sensory Function: WFL Anatomy: Anatomy: WFL Boluses Administered: Boluses Administered Boluses Administered: Thin liquids (Level 0); Mildly thick liquids (Level 2, nectar thick); Moderately thick liquids (Level 3, honey thick); Puree; Solid  Oral Impairment Domain: Oral Impairment Domain Lip Closure: No labial escape Tongue control during bolus hold: Escape to lateral buccal cavity/floor of mouth; Posterior escape of less than half of bolus Bolus preparation/mastication: Disorganized chewing/mashing with solid pieces of bolus unchewed (min d/t edentulous on bottom; top denture plate only) Bolus transport/lingual motion: Brisk tongue motion Oral residue: Trace residue lining oral structures (diffuse) Location of oral residue : Tongue (denture plate) Initiation of pharyngeal swallow : Valleculae; Posterior laryngeal surface of the epiglottis (spilling from valleculae w/ thin liquids)  Pharyngeal Impairment Domain: Pharyngeal Impairment Domain Soft palate elevation: No bolus between soft palate (SP)/pharyngeal wall (PW) Laryngeal elevation: Complete superior  movement of thyroid  cartilage with complete approximation of arytenoids to epiglottic petiole Anterior hyoid excursion: Complete anterior movement Epiglottic movement: Complete inversion Laryngeal vestibule closure: Complete, no air/contrast in laryngeal vestibule Pharyngeal stripping wave : Present - diminished (functional) Pharyngeal contraction (A/P view only): N/A Pharyngoesophageal segment opening: Complete distension and complete duration, no obstruction of flow Tongue base retraction: No contrast between tongue base and posterior pharyngeal wall (PPW) Pharyngeal residue: Trace residue within or on pharyngeal structures Location of pharyngeal residue: Tongue base; Valleculae; Aryepiglottic folds (cleared w/ f/u swallow)  Esophageal Impairment Domain: Esophageal Impairment Domain Esophageal clearance upright position: Complete clearance, esophageal coating Pill: No data recorded Penetration/Aspiration Scale Score: Penetration/Aspiration Scale Score 1.  Material does not enter airway: Thin liquids (Level 0); Mildly thick liquids (Level 2, nectar thick); Moderately thick liquids (Level 3, honey thick); Puree; Solid Compensatory Strategies: Compensatory Strategies Compensatory strategies: Yes Multiple swallows: Effective (f/u dry swallow x1) Effective Multiple Swallows: Thin liquid (Level 0); Mildly thick liquid (Level 2, nectar thick); Moderately thick liquid (Level 3, honey thick); Puree; Solid   General Information: Caregiver present: No  Diet Prior to this Study: NPO   Temperature : Normal (wbc 7.7)   Respiratory Status: WFL   Supplemental O2: None (Room air)   History of Recent Intubation: No  Behavior/Cognition: Alert; Cooperative; Pleasant mood; Distractible; Requires cueing (baseline Dementia dx) Self-Feeding Abilities: Able to self-feed; Needs set-up for self-feeding Baseline vocal quality/speech: Normal Volitional Cough: Able to elicit Volitional Swallow: Able to elicit Exam Limitations: No limitations Goal Planning:  Prognosis for improved oropharyngeal function: Good Barriers to Reach Goals: Cognitive deficits Barriers/Prognosis Comment: baseline Dementia Patient/Family Stated Goal: "something to drink" Consulted and agree with results and recommendations: Patient; Nurse; Physician Pain: Pain Assessment Pain Assessment: No/denies pain End of Session: Start Time:SLP Start Time (ACUTE ONLY): 0915 Stop Time: SLP Stop Time (ACUTE ONLY): 1015 Time Calculation:SLP Time Calculation (min) (ACUTE ONLY): 60 min Charges: SLP Evaluations $ SLP Speech Visit: 1 Visit SLP Evaluations $MBS Swallow: 1 Procedure SLP visit diagnosis: SLP Visit Diagnosis: Dysphagia, unspecified (R13.10) Past Medical History: Past Medical History: Diagnosis Date  CAD (coronary artery disease)   Nonobstructive cardiac disease by cath  CHF (congestive heart failure) (HCC)   nonischemic cardiomyopathy  CKD (chronic kidney disease)   History of kidney stones   Hyperlipidemia   Hypertension   Inguinal hernia   PAF (paroxysmal atrial fibrillation) (HCC)   Patella fracture 01/09/2015  Renal colic 10/07/2011  Ventricular tachycardia (HCC)   s/p ICD Past Surgical History: Past Surgical History: Procedure Laterality Date  CARDIAC CATHETERIZATION    CARDIAC DEFIBRILLATOR PLACEMENT  2009  CYSTOSCOPY WITH LITHOLAPAXY N/A 02/21/2018  Procedure: CYSTOSCOPY WITH LITHOLAPAXY;  Surgeon: Sondra Come, MD;  Location: [facility];  Service: Urology;  Laterality: N/A;  ELBOW SURGERY Left   HOLMIUM LASER APPLICATION N/A 02/21/2018  Procedure: HOLMIUM LASER APPLICATION;  Surgeon: Sondra Come, MD;  Location: [facility];  Service: Urology;  Laterality: N/A;  ICD GENERATOR CHANGEOUT N/A 01/28/2021  Procedure: ICD GENERATOR CHANGEOUT;  Surgeon: Marcina Millard, MD;  Location: [facility];  Service: Cardiovascular;  Laterality: N/A;  INGUINAL HERNIA REPAIR    LITHOTRIPSY    ORIF left wrist fracture Left   ORIF PATELLA Left 01/10/2015  Procedure: OPEN REDUCTION INTERNAL (ORIF)  FIXATION PATELLA;  Surgeon: Donato Heinz, MD;  Location: [facility];  Service: Orthopedics;  Laterality: Left;  ORIF PATELLA Left 02/06/2015  Procedure: OPEN REDUCTION INTERNAL (ORIF) FIXATION PATELLA;  Surgeon: Illene Labrador  Hooten, MD;  Location: [facility];  Service: Orthopedics;  Laterality: Left;  TRANSURETHRAL RESECTION OF PROSTATE N/A 02/21/2018  Procedure: TRANSURETHRAL RESECTION OF THE PROSTATE (TURP);  Surgeon: Sondra Come, MD;  Location: [facility];  Service: Urology;  Laterality: N/A; Jerilynn Som, MS, CCC-SLP Speech Language Pathologist Rehab Services; [facility] [place] [phone] (ascom) Watson,Katherine 11/18/2022, 3:19 PM  US Abdomen Limited RUQ (LIVER/GB)  Result Date: 11/18/2022 CLINICAL DATA:  Abnormal LFTs EXAM: ULTRASOUND ABDOMEN LIMITED RIGHT UPPER QUADRANT COMPARISON:  None Available. FINDINGS: Gallbladder: Gallbladder wall thickening. Gallstones, largest measures 1.2 cm. Pericholecystic fluid. No sonographic Murphy sign noted by sonographer. Common bile duct: Diameter: 2 mm Liver: No focal lesion identified. Increased parenchymal echogenicity. Portal vein is patent on color Doppler imaging with normal direction of blood flow towards the liver. Other: None. IMPRESSION: 1. Gallstones, gallbladder wall thickening and pericholecystic fluid. Although findings are concerning for acute cholecystitis, sonographic Eulah Pont sign is negative and findings could also be secondary to systemic process. Recommend clinical correlation. 2. Hepatic steatosis. Electronically Signed   By: Allegra Lai M.D.   On: 11/18/2022 10:40   CT Angio Chest Pulmonary Embolism (PE) W or WO Contrast  Result Date: 11/23/2022 CLINICAL DATA:  Pulmonary embolism (PE) suspected, high prob RLE swelling, elevated trop, elevated LFTs, AMS, falls; Abdominal pain, acute, nonlocalized elevated LFTs and t bili EXAM: CT ANGIOGRAPHY CHEST CT ABDOMEN AND PELVIS WITH CONTRAST TECHNIQUE: Multidetector CT imaging of the chest was  performed using the standard protocol during bolus administration of intravenous contrast. Multiplanar CT image reconstructions and MIPs were obtained to evaluate the vascular anatomy. Multidetector CT imaging of the abdomen and pelvis was performed using the standard protocol during bolus administration of intravenous contrast. RADIATION DOSE REDUCTION: This exam was performed according to the departmental dose-optimization program which includes automated exposure control, adjustment of the mA and/or kV according to patient size and/or use of iterative reconstruction technique. CONTRAST:  OMNIPAQUE IOHEXOL 350 MG/ML SOLN COMPARISON:  06/12/2021 FINDINGS: CTA CHEST FINDINGS Cardiovascular: Apparent filling defects within the right middle and lower lobar pulmonary arteries are artifactual and related to pulmonary vascular redistribution of setting of consolidation. Delayed imaging on CT examination of the abdomen pelvis demonstrates patency of these structures and no intraluminal filling defect identified to suggest pulmonary embolism. The central pulmonary arteries, however, are enlarged in keeping with changes of pulmonary arterial hypertension. Extensive multi-vessel coronary artery calcification. Extensive calcification of the aortic valve leaflets. Mild to moderate global cardiomegaly with left ventricular dilation. Left subclavian pacemaker in place with leads within the right atrium, right ventricle, and left ventricular venous outflow. No pericardial effusion. Moderate atherosclerotic calcification within the thoracic aorta. No aortic aneurysm. Mediastinum/Nodes: Visualized thyroid is unremarkable. No pathologic thoracic adenopathy. Esophagus unremarkable. Lungs/Pleura: There is right-sided volume loss with elevation of the right hemidiaphragm and right middle and lower lobar collapse and consolidation. Superimposed extensive airway impaction within the right lower lobe and, to a lesser extent, right  middle lobe and peribronchial nodularity is seen which may relate to superimposed changes aspiration or acute infection. Trace right pleural effusion. No pneumothorax. Musculoskeletal: No acute bone abnormality. No lytic or blastic bone lesion. Review of the MIP images confirms the above findings. CT ABDOMEN and PELVIS FINDINGS Hepatobiliary: Cholelithiasis without pericholecystic inflammatory changes identified. Liver unremarkable. No intra or extrahepatic biliary ductal dilation. Pancreas: Unremarkable Spleen: Unremarkable Adrenals/Urinary Tract: The adrenal glands are unremarkable. The kidneys are mildly atrophic. Multiple simple cortical and parapelvic cysts are seen within the kidneys bilaterally  for which no follow-up imaging is recommended. The kidneys are otherwise unremarkable. The bladder contains a 15 mm dependently layering calculus. The bladder is not distended. Stomach/Bowel: The stomach, small bowel, and large bowel are unremarkable. The appendix is absent. No free intraperitoneal gas or fluid. Vascular/Lymphatic: Extensive aortoiliac atherosclerotic calcification. Extensive visceral arteriosclerosis. No aortic aneurysm. No pathologic adenopathy within the abdomen and pelvis. Reproductive: Prostate is unremarkable. Other: No abdominal wall hernia. Diffuse subcutaneous body wall edema is present in keeping with changes of mild anasarca. Musculoskeletal: Osseous structures are age-appropriate. Diffuse osteopenia. No acute bone abnormality. No lytic or blastic bone lesion. Review of the MIP images confirms the above findings. IMPRESSION: 1. No pulmonary embolism. 2. Right-sided volume loss with elevation of the right hemidiaphragm and right middle and lower lobar collapse and consolidation. Superimposed extensive airway impaction within the right lower lobe and, to a lesser extent, right middle lobe and peribronchial nodularity which may relate to superimposed changes aspiration or acute infection. Trace  right pleural effusion. 3. Mild to moderate global cardiomegaly with left ventricular dilation. Extensive multi-vessel coronary artery calcification. Morphologic changes in keeping with pulmonary arterial hypertension. 4. Cholelithiasis. 5. 15 mm dependently layering calculus within the bladder. 6. Mild anasarca. Aortic Atherosclerosis (ICD10-I70.0). Electronically Signed   By: Helyn Numbers M.D.   On: 12/08/2022 22:15   CT ABDOMEN PELVIS W CONTRAST  Result Date: 12/02/2022 CLINICAL DATA:  Pulmonary embolism (PE) suspected, high prob RLE swelling, elevated trop, elevated LFTs, AMS, falls; Abdominal pain, acute, nonlocalized elevated LFTs and t bili EXAM: CT ANGIOGRAPHY CHEST CT ABDOMEN AND PELVIS WITH CONTRAST TECHNIQUE: Multidetector CT imaging of the chest was performed using the standard protocol during bolus administration of intravenous contrast. Multiplanar CT image reconstructions and MIPs were obtained to evaluate the vascular anatomy. Multidetector CT imaging of the abdomen and pelvis was performed using the standard protocol during bolus administration of intravenous contrast. RADIATION DOSE REDUCTION: This exam was performed according to the departmental dose-optimization program which includes automated exposure control, adjustment of the mA and/or kV according to patient size and/or use of iterative reconstruction technique. CONTRAST:  OMNIPAQUE IOHEXOL 350 MG/ML SOLN COMPARISON:  06/12/2021 FINDINGS: CTA CHEST FINDINGS Cardiovascular: Apparent filling defects within the right middle and lower lobar pulmonary arteries are artifactual and related to pulmonary vascular redistribution of setting of consolidation. Delayed imaging on CT examination of the abdomen pelvis demonstrates patency of these structures and no intraluminal filling defect identified to suggest pulmonary embolism. The central pulmonary arteries, however, are enlarged in keeping with changes of pulmonary arterial hypertension.  Extensive multi-vessel coronary artery calcification. Extensive calcification of the aortic valve leaflets. Mild to moderate global cardiomegaly with left ventricular dilation. Left subclavian pacemaker in place with leads within the right atrium, right ventricle, and left ventricular venous outflow. No pericardial effusion. Moderate atherosclerotic calcification within the thoracic aorta. No aortic aneurysm. Mediastinum/Nodes: Visualized thyroid is unremarkable. No pathologic thoracic adenopathy. Esophagus unremarkable. Lungs/Pleura: There is right-sided volume loss with elevation of the right hemidiaphragm and right middle and lower lobar collapse and consolidation. Superimposed extensive airway impaction within the right lower lobe and, to a lesser extent, right middle lobe and peribronchial nodularity is seen which may relate to superimposed changes aspiration or acute infection. Trace right pleural effusion. No pneumothorax. Musculoskeletal: No acute bone abnormality. No lytic or blastic bone lesion. Review of the MIP images confirms the above findings. CT ABDOMEN and PELVIS FINDINGS Hepatobiliary: Cholelithiasis without pericholecystic inflammatory changes identified. Liver unremarkable. No intra or extrahepatic  biliary ductal dilation. Pancreas: Unremarkable Spleen: Unremarkable Adrenals/Urinary Tract: The adrenal glands are unremarkable. The kidneys are mildly atrophic. Multiple simple cortical and parapelvic cysts are seen within the kidneys bilaterally for which no follow-up imaging is recommended. The kidneys are otherwise unremarkable. The bladder contains a 15 mm dependently layering calculus. The bladder is not distended. Stomach/Bowel: The stomach, small bowel, and large bowel are unremarkable. The appendix is absent. No free intraperitoneal gas or fluid. Vascular/Lymphatic: Extensive aortoiliac atherosclerotic calcification. Extensive visceral arteriosclerosis. No aortic aneurysm. No pathologic  adenopathy within the abdomen and pelvis. Reproductive: Prostate is unremarkable. Other: No abdominal wall hernia. Diffuse subcutaneous body wall edema is present in keeping with changes of mild anasarca. Musculoskeletal: Osseous structures are age-appropriate. Diffuse osteopenia. No acute bone abnormality. No lytic or blastic bone lesion. Review of the MIP images confirms the above findings. IMPRESSION: 1. No pulmonary embolism. 2. Right-sided volume loss with elevation of the right hemidiaphragm and right middle and lower lobar collapse and consolidation. Superimposed extensive airway impaction within the right lower lobe and, to a lesser extent, right middle lobe and peribronchial nodularity which may relate to superimposed changes aspiration or acute infection. Trace right pleural effusion. 3. Mild to moderate global cardiomegaly with left ventricular dilation. Extensive multi-vessel coronary artery calcification. Morphologic changes in keeping with pulmonary arterial hypertension. 4. Cholelithiasis. 5. 15 mm dependently layering calculus within the bladder. 6. Mild anasarca. Aortic Atherosclerosis (ICD10-I70.0). Electronically Signed   By: Helyn Numbers M.D.   On: 12/08/2022 22:15   CT Head Wo Contrast  Result Date: 12/09/2022 CLINICAL DATA:  Multiple falls EXAM: CT HEAD WITHOUT CONTRAST CT CERVICAL SPINE WITHOUT CONTRAST TECHNIQUE: Multidetector CT imaging of the head and cervical spine was performed following the standard protocol without intravenous contrast. Multiplanar CT image reconstructions of the cervical spine were also generated. RADIATION DOSE REDUCTION: This exam was performed according to the departmental dose-optimization program which includes automated exposure control, adjustment of the mA and/or kV according to patient size and/or use of iterative reconstruction technique. COMPARISON:  05/16/2022 CT head, no prior CT cervical spine. FINDINGS: CT HEAD FINDINGS Brain: No evidence of acute  infarct, hemorrhage, mass, mass effect, or midline shift. No hydrocephalus or extra-axial fluid collection. Age related cerebral atrophy. Vascular: No hyperdense vessel. Atherosclerotic calcifications in the intracranial carotid and vertebral arteries. Skull: Negative for fracture or focal lesion. Sinuses/Orbits: No acute finding. Chronic left lamina papyracea fracture. Other: The mastoid air cells are well aerated. CT CERVICAL SPINE FINDINGS Alignment: No traumatic listhesis. Trace anterolisthesis of to C5 on C6 and C7 on T1, which appears facet mediated. Dextrocurvature of the cervical spine may be positional. Skull base and vertebrae: No acute fracture or suspicious osseous lesion. Soft tissues and spinal canal: No prevertebral fluid or swelling. No visible canal hematoma. Disc levels: Degenerative changes in the cervical spine.No high-grade spinal canal stenosis. Upper chest: No focal pulmonary opacity or pleural effusion. IMPRESSION: 1. No acute intracranial process. 2. No acute fracture or traumatic listhesis in the cervical spine. Electronically Signed   By: Wiliam Ke M.D.   On: 11/22/2022 19:51   CT CERVICAL SPINE WO CONTRAST  Result Date: 12/08/2022 CLINICAL DATA:  Multiple falls EXAM: CT HEAD WITHOUT CONTRAST CT CERVICAL SPINE WITHOUT CONTRAST TECHNIQUE: Multidetector CT imaging of the head and cervical spine was performed following the standard protocol without intravenous contrast. Multiplanar CT image reconstructions of the cervical spine were also generated. RADIATION DOSE REDUCTION: This exam was performed according to the departmental dose-optimization program which  includes automated exposure control, adjustment of the mA and/or kV according to patient size and/or use of iterative reconstruction technique. COMPARISON:  05/16/2022 CT head, no prior CT cervical spine. FINDINGS: CT HEAD FINDINGS Brain: No evidence of acute infarct, hemorrhage, mass, mass effect, or midline shift. No  hydrocephalus or extra-axial fluid collection. Age related cerebral atrophy. Vascular: No hyperdense vessel. Atherosclerotic calcifications in the intracranial carotid and vertebral arteries. Skull: Negative for fracture or focal lesion. Sinuses/Orbits: No acute finding. Chronic left lamina papyracea fracture. Other: The mastoid air cells are well aerated. CT CERVICAL SPINE FINDINGS Alignment: No traumatic listhesis. Trace anterolisthesis of to C5 on C6 and C7 on T1, which appears facet mediated. Dextrocurvature of the cervical spine may be positional. Skull base and vertebrae: No acute fracture or suspicious osseous lesion. Soft tissues and spinal canal: No prevertebral fluid or swelling. No visible canal hematoma. Disc levels: Degenerative changes in the cervical spine.No high-grade spinal canal stenosis. Upper chest: No focal pulmonary opacity or pleural effusion. IMPRESSION: 1. No acute intracranial process. 2. No acute fracture or traumatic listhesis in the cervical spine. Electronically Signed   By: Wiliam Ke M.D.   On: 11/27/2022 19:51    Pending Labs Unresulted Labs (From admission, onward)     Start     Ordered   11/19/22 1500  Heparin level (unfractionated)  Once-Timed,   TIMED        11/19/22 0712   11/19/22 0840  MRSA Next Gen by PCR, Nasal  (MRSA Screening)  ONCE - URGENT,   URGENT        11/19/22 0840   11/19/22 0500  Hepatitis panel, acute  Tomorrow morning,   R        11/18/22 1649   11/18/22 1530  MRSA Next Gen by PCR, Nasal  (MRSA Screening)  Once,   R        11/18/22 1507   11/18/22 0132  Legionella Urine Antigen  (COPD / Pneumonia / Cellulitis / Lower Extremity Wound)  ONCE - URGENT,   URGENT        11/18/22 0134            Vitals/Pain Today's Vitals   11/19/22 0928 11/19/22 1030 11/19/22 1150 11/19/22 1200  BP: (!) 84/63 92/60  93/65  Pulse: (!) 122   (!) 120  Resp: (!) 21 19  (!) 21  Temp:   (!) 97.5 F (36.4 C)   TempSrc:   Axillary   SpO2: 100%   100%   Weight:      Height:      PainSc:        Isolation Precautions No active isolations  Medications Medications  sodium chloride flush (NS) 0.9 % injection 10 mL (0 mLs Intravenous Hold 11/19/22 0930)  heparin ADULT infusion 100 units/mL (25000 units/243mL) (950 Units/hr Intravenous New Bag/Given 11/19/22 1150)  acetaminophen (TYLENOL) tablet 650 mg (has no administration in time range)    Or  acetaminophen (TYLENOL) suppository 650 mg (has no administration in time range)  ondansetron (ZOFRAN) tablet 4 mg (has no administration in time range)    Or  ondansetron (ZOFRAN) injection 4 mg (has no administration in time range)  ceFEPIme (MAXIPIME) 2 g in sodium chloride 0.9 % 100 mL IVPB (0 g Intravenous Stopped 11/19/22 0050)  furosemide (LASIX) injection 80 mg (80 mg Intravenous Given 11/19/22 0142)  vancomycin (VANCOREADY) IVPB 1250 mg/250 mL (has no administration in time range)  sodium chloride 0.9 % bolus 1,000 mL (0 mLs  Intravenous Stopped 11/16/2022 2353)  iohexol (OMNIPAQUE) 350 MG/ML injection 100 mL (100 mLs Intravenous Contrast Given 12/07/2022 2101)  vancomycin (VANCOCIN) IVPB 1000 mg/200 mL premix (0 mg Intravenous Stopped 11/18/22 0123)  ceFEPIme (MAXIPIME) 2 g in sodium chloride 0.9 % 100 mL IVPB (0 g Intravenous Stopped 12/01/2022 2346)  diazepam (VALIUM) injection 2.5 mg (2.5 mg Intravenous Given 11/18/22 2211)  diazepam (VALIUM) injection 2.5 mg (2.5 mg Intravenous Given 11/19/22 0326)  heparin bolus via infusion 700 Units (700 Units Intravenous Bolus from Bag 11/19/22 0707)  digoxin (LANOXIN) 0.25 MG/ML injection 0.125 mg (0.125 mg Intravenous Given 11/19/22 1232)    Mobility walks with person assist     Focused Assessments Cardiac Assessment Handoff:  Cardiac Rhythm: Atrial fibrillation, Sinus tachycardia Lab Results  Component Value Date   CKTOTAL 692 (H) 11/18/2022   TROPONINI <0.03 03/02/2018   No results found for: "DDIMER" Does the Patient currently have chest pain? No     R Recommendations: See Admitting Provider Note  Report given to:   Additional Notes:

## 2022-11-19 NOTE — ED Notes (Signed)
Patient repeatedly removes monitoring devices despite having mitts on.  Provider notified that patient is removing monitors and has already removed 3 IV's on this shift per the previous nurse.   This nurse just went into the room to find patient had removed all of his tele leads, 1 mitt was off and the other was loosened, and pt was in the process of attempting to climb out of the bed.   This nurse attempted to re-orient patient and pt replied "I have already quit 1 job today because of how people talked to me". This nurse attempted to repeatedly re-orient patient without success.  Pt's ECG leads replaced, mitts replaced, and patient re-positioned in bed.  Pt was not able to be re-oriented at this time.

## 2022-11-19 NOTE — Progress Notes (Signed)
PHARMACY - PHYSICIAN COMMUNICATION CRITICAL VALUE ALERT - BLOOD CULTURE IDENTIFICATION (BCID)  ACHILLEUS VANARSDALL is an 89 y.o. male who presented to [facility] on 11/26/2022 with a chief complaint of multiple falls before admission. Chest CR showed lower lobe collapse and consolidation. Treating on empiric IV antibiotics.   Assessment: 1/4 aerobic bottle gram positive rod; no organism identified  Name of physician (or Provider) Contacted: Dr. Lurene Shadow  Current antibiotics:  Cefepime 2g Q24H Vancomycin 1250 mg Q48H  Changes to prescribed antibiotics recommended:  Keeping on the same antibiotics pending culture results to identify organism.  No results found for this or any previous visit.  Effie Shy, PharmD Pharmacy Resident  11/19/2022 3:50 PM

## 2022-11-19 NOTE — Plan of Care (Signed)
  Problem: Education: Goal: Ability to demonstrate management of disease process will improve Outcome: Progressing   Problem: Activity: Goal: Capacity to carry out activities will improve Outcome: Progressing   

## 2022-11-20 ENCOUNTER — Encounter: Payer: Self-pay | Admitting: Family

## 2022-11-20 DIAGNOSIS — I5043 Acute on chronic combined systolic (congestive) and diastolic (congestive) heart failure: Secondary | ICD-10-CM | POA: Diagnosis not present

## 2022-11-20 LAB — COMPREHENSIVE METABOLIC PANEL
ALT: 778 U/L — ABNORMAL HIGH (ref 0–44)
AST: 334 U/L — ABNORMAL HIGH (ref 15–41)
Albumin: 2.7 g/dL — ABNORMAL LOW (ref 3.5–5.0)
Alkaline Phosphatase: 91 U/L (ref 38–126)
Anion gap: 13 (ref 5–15)
BUN: 43 mg/dL — ABNORMAL HIGH (ref 8–23)
CO2: 22 mmol/L (ref 22–32)
Calcium: 8.6 mg/dL — ABNORMAL LOW (ref 8.9–10.3)
Chloride: 102 mmol/L (ref 98–111)
Creatinine, Ser: 1.77 mg/dL — ABNORMAL HIGH (ref 0.61–1.24)
GFR, Estimated: 36 mL/min — ABNORMAL LOW (ref >60)
Glucose, Bld: 109 mg/dL — ABNORMAL HIGH (ref 70–99)
Potassium: 4.1 mmol/L (ref 3.5–5.1)
Sodium: 137 mmol/L (ref 135–145)
Total Bilirubin: 1.4 mg/dL — ABNORMAL HIGH (ref <1.2)
Total Protein: 5.1 g/dL — ABNORMAL LOW (ref 6.5–8.1)

## 2022-11-20 LAB — CBC WITH DIFFERENTIAL/PLATELET
Abs Immature Granulocytes: 0.04 10*3/uL (ref 0.00–0.07)
Basophils Absolute: 0 10*3/uL (ref 0.0–0.1)
Basophils Relative: 0 %
Eosinophils Absolute: 0 10*3/uL (ref 0.0–0.5)
Eosinophils Relative: 0 %
HCT: 42.6 % (ref 39.0–52.0)
Hemoglobin: 14 g/dL (ref 13.0–17.0)
Immature Granulocytes: 1 %
Lymphocytes Relative: 16 %
Lymphs Abs: 1 10*3/uL (ref 0.7–4.0)
MCH: 30.6 pg (ref 26.0–34.0)
MCHC: 32.9 g/dL (ref 30.0–36.0)
MCV: 93 fL (ref 80.0–100.0)
Monocytes Absolute: 0.4 10*3/uL (ref 0.1–1.0)
Monocytes Relative: 6 %
Neutro Abs: 5.1 10*3/uL (ref 1.7–7.7)
Neutrophils Relative %: 77 %
Platelets: 107 10*3/uL — ABNORMAL LOW (ref 150–400)
RBC: 4.58 MIL/uL (ref 4.22–5.81)
RDW: 14.6 % (ref 11.5–15.5)
WBC: 6.6 10*3/uL (ref 4.0–10.5)
nRBC: 0 % (ref 0.0–0.2)

## 2022-11-20 LAB — LEGIONELLA PNEUMOPHILA SEROGP 1 UR AG: L. pneumophila Serogp 1 Ur Ag: NEGATIVE

## 2022-11-20 LAB — MRSA NEXT GEN BY PCR, NASAL: MRSA by PCR Next Gen: NOT DETECTED

## 2022-11-20 MED ORDER — ACETAMINOPHEN 650 MG RE SUPP: 650.0000 mg | Freq: Four times a day (QID) | RECTAL | Status: DC | PRN

## 2022-11-20 MED ORDER — GLYCOPYRROLATE 0.2 MG/ML IJ SOLN: 0.2000 mg | INTRAMUSCULAR | Status: DC | PRN

## 2022-11-20 MED ORDER — GLYCOPYRROLATE 0.2 MG/ML IJ SOLN
0.2000 mg | INTRAMUSCULAR | Status: DC | PRN
  Administered 2022-11-21: 0.2 mg via SUBCUTANEOUS
  Filled 2022-11-20 (×2): qty 1

## 2022-11-20 MED ORDER — POLYVINYL ALCOHOL 1.4 % OP SOLN: 1.0000 [drp] | Freq: Four times a day (QID) | OPHTHALMIC | Status: DC | PRN

## 2022-11-20 MED ORDER — AMOXICILLIN-POT CLAVULANATE 500-125 MG PO TABS: 1.0000 | ORAL_TABLET | Freq: Two times a day (BID) | ORAL | Status: DC

## 2022-11-20 MED ORDER — ACETAMINOPHEN 325 MG PO TABS: 650.0000 mg | ORAL_TABLET | Freq: Four times a day (QID) | ORAL | Status: DC | PRN

## 2022-11-20 MED ORDER — LORAZEPAM 2 MG/ML IJ SOLN: 2.0000 mg | INTRAMUSCULAR | Status: DC | PRN

## 2022-11-20 MED ORDER — MORPHINE SULFATE (PF) 2 MG/ML IV SOLN
2.0000 mg | INTRAVENOUS | Status: DC | PRN
Start: 2022-11-20 — End: 2022-11-22

## 2022-11-20 MED ORDER — GLYCOPYRROLATE 1 MG PO TABS
1.0000 mg | ORAL_TABLET | ORAL | Status: DC | PRN
  Filled 2022-11-20: qty 1

## 2022-11-20 MED ORDER — QUETIAPINE FUMARATE 25 MG PO TABS
25.0000 mg | ORAL_TABLET | Freq: Every day | ORAL | Status: DC
  Administered 2022-11-20: 25 mg via ORAL
  Filled 2022-11-20: qty 1
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
## 2022-11-20 NOTE — Progress Notes (Signed)
pts bp dropping when sitting upright. became nauseated... increase bp when laid flat.  Notified md   84/56 to 97/51

## 2022-11-21 DIAGNOSIS — I5043 Acute on chronic combined systolic (congestive) and diastolic (congestive) heart failure: Secondary | ICD-10-CM | POA: Diagnosis not present

## 2022-11-21 MED ORDER — MORPHINE SULFATE (CONCENTRATE) 10 MG/0.5ML PO SOLN
10.0000 mg | ORAL | Status: DC | PRN
  Administered 2022-11-21 (×3): 10 mg via ORAL
  Filled 2022-11-21 (×3): qty 0.5

## 2022-11-21 MED ORDER — ORAL CARE MOUTH RINSE: 15.0000 mL | OROMUCOSAL | Status: DC | PRN

## 2022-11-22 LAB — CULTURE, BLOOD (ROUTINE X 2)
Culture: NO GROWTH
Special Requests: ADEQUATE

## 2022-12-11 NOTE — TOC Initial Note (Signed)
Transition of Care [facility]) - Initial/Assessment Note    Patient Details  Name: Spencer Coleman MRN: [id number] Date of Birth: [birth year]  Transition of Care [facility]) CM/SW Contact:    Margarito Liner, LCSW Phone Number: 12/04/2022, 2:33 PM  Clinical Narrative:    Received call back from daughter [name]. Discuss hospice facility vs home with hospice. She wants to see if he is eligible for the hospice home. If he does not meet criteria, she said she doesn't think he can afford 24/7 care at home. She stated they have tried to have him placed in the past but were unable to complete the process. CSW sent referral to Renea Ee with [facility]. No further concerns. CSW encouraged patient's daughter to contact CSW as needed. CSW will continue to follow patient and his daughter for support and facilitate discharge once venue determined.              Expected Discharge Plan:  (TBD) Barriers to Discharge: Continued Medical Work up   Patient Goals and CMS Choice            Expected Discharge Plan and Services     Post Acute Care Choice: Hospice Living arrangements for the past 2 months: Single Family Home                                      Prior Living Arrangements/Services Living arrangements for the past 2 months: Single Family Home Lives with:: Self Patient language and need for interpreter reviewed:: Yes Do you feel safe going back to the place where you live?: Yes      Need for Family Participation in Patient Care: Yes (Comment)     Criminal Activity/Legal Involvement Pertinent to Current Situation/Hospitalization: No - Comment as needed  Activities of Daily Living      Permission Sought/Granted Permission sought to share information with : Facility Medical sales representative, Family Supports Permission granted to share information with : Yes, Verbal Permission Granted  Share Information with NAME: Xayden Stalbaum  Permission granted to share info w AGENCY:  [facility]  Permission granted to share info w Relationship: Daughter/HCPOA  Permission granted to share info w Contact Information: [phone]  Emotional Assessment Appearance:: Appears stated age Attitude/Demeanor/Rapport: Unable to Assess Affect (typically observed): Unable to Assess Orientation: :  (Disoriented x 4) Alcohol / Substance Use: Not Applicable Psych Involvement: No (comment)  Admission diagnosis:  Elevated troponin [R79.89] Elevated LFTs [R79.89] CHF exacerbation (HCC) [I50.9] Acute on chronic systolic congestive heart failure (HCC) [I50.23] Fall, initial encounter [W19.XXXA] Pneumonia of right lower lobe due to infectious organism [J18.9] Patient Active Problem List   Diagnosis Date Noted   Elevated LFTs 11/18/2022   Chronic anticoagulation 11/18/2022   CHF exacerbation (HCC) 11/20/2022   Aspiration pneumonia (HCC) 11/22/2022   Rhabdomyolysis 12/06/2022   NSTEMI (non-ST elevated myocardial infarction) (HCC) 11/13/2022   Frequent falls 12/03/2022   Frailty 11/28/2022   Hypotension 05/21/2022   Acute hypoxic respiratory failure (HCC) 05/12/2022   Physical deconditioning 06/13/2021   Acute on chronic combined systolic and diastolic CHF (congestive heart failure) (HCC) 06/12/2021   Acute renal failure superimposed on stage 3a chronic kidney disease (HCC) 06/12/2021   Cholelithiasis 06/12/2021   Elevated troponin 06/12/2021   Thrombocytopenia (HCC) 06/12/2021   CAD (coronary artery disease) 06/12/2021   Delirium 06/12/2021   Paroxysmal atrial fibrillation (HCC) 06/12/2021   Hx subclinical atrial fibrillation on ICD interrogation, concern  for Afib/RVR  01/06/2021   CKD (chronic kidney disease) stage 3, GFR 30-59 ml/min (HCC) 01/06/2021   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   Closed displaced fracture of coronoid process of left ulna 06/28/2016   Protein calorie malnutrition (HCC) 02/08/2015   Calculus of kidney 01/09/2015   History  of cardiac catheterization 06/06/2013   Essential hypertension 06/06/2013   HLD (hyperlipidemia) 06/06/2013   ACE inhibitor intolerance 06/06/2013   Ventricular tachycardia 04/05/2013   SVT (supraventricular tachycardia) (HCC) 04/05/2013   Automatic implantable cardioverter-defibrillator in situ 03/25/2013   Calculus in bladder 08/07/2012   Gross hematuria 08/07/2012   Incomplete emptying of bladder 08/07/2012   Acute cystitis 10/07/2011   Benign localized prostatic hyperplasia with lower urinary tract symptoms (LUTS) 10/07/2011   PCP:  Barbette Reichmann, MD Pharmacy:   [facility] [phone] Dan Humphreys, [place] - [address] AT [facility] OF [address] [place] [address] [place] [address] [place] [address] Phone: [phone] Fax: [phone]     Social Determinants of Health (SDOH) Social History: SDOH Screenings   Food Insecurity: No Food Insecurity (06/03/2022)   Received from [facility], [facility]  Housing: Low Risk  (05/16/2022)  Transportation Needs: No Transportation Needs (06/03/2022)   Received from [facility], [facility]  Utilities: Not At Risk (06/03/2022)   Received from [facility], [facility]  Financial Resource Strain: Low Risk  (06/03/2022)   Received from [facility], [facility]  Tobacco Use: Low Risk  (11/25/2022)   SDOH Interventions:     Readmission Risk Interventions     No data to display

## 2022-12-11 NOTE — TOC CM/SW Note (Signed)
Per notes, daughter/HCPOA has decided to pursue comfort care/hospice. Brother, sister, niece and nephew at bedside. CSW left daughter a Engineer, technical sales. Will discuss hospice options when she calls back.  Charlynn Court, CSW [phone]

## 2022-12-11 NOTE — Death Summary Note (Signed)
DEATH SUMMARY   Patient Details  Name: Spencer Coleman MRN: [id number] DOB: Apr 07, 1930 WUJ:[id number], Roderic Palau, MD Admission/Discharge Information   Admit Date:  2022/12/16  Date of Death: Date of Death: December 20, 2022  Time of Death: Time of Death: 1710  Length of Stay: 4   Principle Cause of death: Congestive heart failure  Hospital Diagnoses: Active Problems:   Acute on chronic combined systolic and diastolic CHF (congestive heart failure) (HCC)   Aspiration pneumonia (HCC)   Elevated LFTs   Acute renal failure superimposed on stage 3a chronic kidney disease (HCC)   Automatic implantable cardioverter-defibrillator in situ   CAD (coronary artery disease)   Chronic anticoagulation   Cholelithiasis   Frequent falls   NSTEMI (non-ST elevated myocardial infarction) (HCC)   Paroxysmal atrial fibrillation (HCC)   Rhabdomyolysis   Thrombocytopenia (HCC)   Protein calorie malnutrition (HCC)   Physical deconditioning   Hypotension   Frailty   Hospital Course:    Spencer Coleman is a 89 y.o. 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  He presented to the hospital because of multiple falls. Patient fell the night prior to admission and was unable to get up spending all night on the floor.  EMS assisted him up the following morning but he fell again in the afternoon and was brought in by EMS for evaluation.     On arrival BP 99/51 with mild tachypnea to the low 20s and otherwise normal vitals. Labs: Troponin 476--356, with BNP> 4500 WBC 8900 with lactic acid 1.8.  Hemoglobin 12 VBG with normal pH and pCO2 CK 920, ammonia 11, UA unremarkable BMP with creatinine 1.92 up from baseline of 1.4 a  month ago Markedly abnormal hepatic function panel as follows: AST 1751, ALT 1049, alk phos 134, total bili 2.6      Assessment and Plan:   Acute on chronic systolic and diastolic CHF (congestive heart failure) (HCC) Cardiomyopathy, EF 20 to 25%, grade 2 diastolic dysfunction in 05/2022 Patient clinically fluid overloaded, BNP over 4500, chest CT showing global cardiomegaly, mild anasarca.  He was treated with IV Lasix 2D echo on 11/18/2022 showed estimated EF 20 to 25%, indeterminate LV diastolic parameters.  EF is unchanged from prior echo in May 2024.      Elevated liver enzymes Liver enzymes have trended downward though still elevated.  Shock liver/ischemic hepatopathy suspected. Liver ultrasound showed hepatic steatosis, gallstones, gallbladder wall thickening and pericholecystic fluid concerning for acute cholecystitis. Patient was evaluated by general surgeon.  Acute cholecystitis was ruled out   Aspiration pneumonia (HCC) Chest CT showing right middle and lower lobe collapse and consolidation He was treated with IV antibiotics     Demand ischemia, elevated troponins No chest pain.  Type I NSTEMI has been ruled out.   He was treated with IV heparin drip    Rhabdomyolysis Frequent falls/frailty/physical deconditioning CK trended down     Paroxysmal atrial fibrillation (HCC) with RVR History of ventricular tachycardia s/p AICD Chronic anticoagulation He was treated with IV digoxin and IV heparin drip Amiodarone and metoprolol were held because of elevated liver enzymes and hypotension respectively    Acute renal failure superimposed on stage 3a chronic kidney disease (HCC) This was likely from cardiorenal  syndrome     Thrombocytopenia (HCC) He had acute on chronic thrombocytopenia     Delirium with agitation, underlying cognitive impairment/dementia He was treated with Seroquel     His condition was not improving.  His family (including daughter, son and  sister) requested comfort measures.  He was transitioned to comfort care.  Unfortunately, he expired on 12/04/2022 at 5:10 PM.       Procedures: None  Consultations: Cardiologist, gastroenterologist, general surgeon  The results of significant diagnostics from this hospitalization (including imaging, microbiology, ancillary and laboratory) are listed below for reference.   Significant Diagnostic Studies: ECHOCARDIOGRAM COMPLETE  Result Date: 11/18/2022    ECHOCARDIOGRAM REPORT   Patient Name:   Spencer Coleman Date of Exam: 11/18/2022 Medical Rec #:  [id number]    Height:       70.0 in Accession #:    [id number]   Weight:       103.0 lb Date of Birth:  1930/04/30     BSA:          1.574 m Patient Age:    89 years     BP:           101/68 mmHg Patient Gender: M            HR:           82 bpm. Exam Location:  [facility] Procedure: 2D Echo, Cardiac Doppler and Color Doppler Indications:     CHF  History:         Patient has prior history of Echocardiogram examinations, most                  recent 05/13/2022. CHF, CAD, Pacemaker and Defibrillator,                  Arrythmias:Atrial Fibrillation and Tachycardia; Risk                  Factors:Hypertension and Dyslipidemia. NSTEMI, CKD.  Sonographer:     Mikki Harbor Referring Phys:  [id number] Andris Baumann Diagnosing Phys: Debbe Odea MD IMPRESSIONS  1. Left ventricular ejection fraction, by estimation, is 20 to 25%. The left ventricle has severely decreased function. The left ventricle demonstrates global hypokinesis. The left ventricular internal cavity size was severely dilated. Left ventricular diastolic parameters are indeterminate.  2. Right ventricular systolic function is low normal. The right ventricular size is mildly enlarged. There is normal pulmonary artery systolic pressure.  3. Left atrial size was severely dilated.  4. The mitral valve is normal in structure. Moderate to severe mitral valve regurgitation.  5. The aortic valve is calcified.  Aortic valve regurgitation is moderate. Aortic valve sclerosis/calcification is present, without any evidence of aortic stenosis.  6. The inferior vena cava is dilated in size with <50% respiratory variability, suggesting right atrial pressure of 15 mmHg. FINDINGS  Left Ventricle: Left ventricular ejection fraction, by estimation, is 20 to 25%. The left ventricle has severely decreased function. The left ventricle demonstrates global hypokinesis. The left ventricular internal cavity size was severely dilated. There is no left ventricular hypertrophy. Left ventricular diastolic parameters are indeterminate. Right Ventricle: The right ventricular size is mildly enlarged. No increase in right ventricular wall thickness. Right ventricular systolic function is low normal. There is normal pulmonary artery systolic pressure. The tricuspid regurgitant velocity is 2.11 m/s, and with an assumed right atrial pressure of 15 mmHg, the estimated right ventricular systolic pressure is 32.8 mmHg. Left Atrium: Left atrial size was  severely dilated. Right Atrium: Right atrial size was normal in size. Pericardium: There is no evidence of pericardial effusion. Mitral Valve: The mitral valve is normal in structure. Moderate to severe mitral valve regurgitation. MV peak gradient, 3.6 mmHg. The mean mitral valve gradient is 1.0 mmHg. Tricuspid Valve: The tricuspid valve is normal in structure. Tricuspid valve regurgitation is mild. Aortic Valve: The aortic valve is calcified. Aortic valve regurgitation is moderate. Aortic valve sclerosis/calcification is present, without any evidence of aortic stenosis. Aortic valve mean gradient measures 6.0 mmHg. Aortic valve peak gradient measures 12.1 mmHg. Aortic valve area, by VTI measures 1.91 cm. Pulmonic Valve: The pulmonic valve was normal in structure. Pulmonic valve regurgitation is mild to moderate. Aorta: The aortic root is normal in size and structure. Venous: The inferior vena cava is  dilated in size with less than 50% respiratory variability, suggesting right atrial pressure of 15 mmHg. IAS/Shunts: No atrial level shunt detected by color flow Doppler. Additional Comments: A device lead is visualized.  LEFT VENTRICLE PLAX 2D LVIDd:         7.10 cm LVIDs:         6.40 cm LV PW:         0.90 cm LV IVS:        0.90 cm LVOT diam:     2.00 cm LV SV:         62 LV SV Index:   39 LVOT Area:     3.14 cm  LV Volumes (MOD) LV vol d, MOD A2C: 209.0 ml LV vol d, MOD A4C: 202.0 ml LV vol s, MOD A2C: 129.0 ml LV vol s, MOD A4C: 160.0 ml LV SV MOD A2C:     80.0 ml LV SV MOD A4C:     202.0 ml LV SV MOD BP:      54.4 ml RIGHT VENTRICLE RV Basal diam:  4.20 cm RV Mid diam:    3.40 cm RV S prime:     12.30 cm/s LEFT ATRIUM              Index        RIGHT ATRIUM           Index LA diam:        5.70 cm  3.62 cm/m   RA Area:     15.50 cm LA Vol (A2C):   130.0 ml 82.57 ml/m  RA Volume:   42.30 ml  26.87 ml/m LA Vol (A4C):   101.0 ml 64.15 ml/m LA Biplane Vol: 116.0 ml 73.68 ml/m  AORTIC VALVE                     PULMONIC VALVE AV Area (Vmax):    1.86 cm      PV Vmax:       0.88 m/s AV Area (Vmean):   2.07 cm      PV Peak grad:  3.1 mmHg AV Area (VTI):     1.91 cm AV Vmax:           174.00 cm/s AV Vmean:          107.900 cm/s AV VTI:            0.323 m AV Peak Grad:      12.1 mmHg AV Mean Grad:      6.0 mmHg LVOT Vmax:         103.10 cm/s LVOT Vmean:        71.050 cm/s LVOT VTI:  0.197 m LVOT/AV VTI ratio: 0.61  AORTA Ao Root diam: 3.30 cm MITRAL VALVE               TRICUSPID VALVE MV Area (PHT): 4.60 cm    TR Peak grad:   17.8 mmHg MV Area VTI:   2.11 cm    TR Vmax:        211.00 cm/s MV Peak grad:  3.6 mmHg MV Mean grad:  1.0 mmHg    SHUNTS MV Vmax:       0.95 m/s    Systemic VTI:  0.20 m MV Vmean:      51.1 cm/s   Systemic Diam: 2.00 cm MV Decel Time: 165 msec MR Peak grad: 108.2 mmHg MR Mean grad: 61.0 mmHg MR Vmax:      520.00 cm/s MR Vmean:     360.0 cm/s MV E velocity: 74.30 cm/s MV A  velocity: 84.20 cm/s MV E/A ratio:  0.88 Debbe Odea MD Electronically signed by Debbe Odea MD Signature Date/Time: 11/18/2022/4:47:44 PM    Final    DG Swallowing Func-Speech Pathology  Result Date: 11/18/2022 Table formatting from the original result was not included. Modified Barium Swallow Study Patient Details Name: Spencer Coleman MRN: [id number] Date of Birth: [birth year] Today's Date: 11/18/2022 HPI/PMH: Pt is a 89 y.o. male past medical history significant for Dementia, paroxysmal atrial fibrillation on Eliquis, ventricular tachycardia with ICD, hyperlipidemia, hypertension, CHF with reduced EF, presents to the emergency department following a fall.  History is provided by the patient's sister at bedside.  States that he had a fall yesterday out of bed and was unable to get up.  States that he has had falls in the past but has always been able to get up on his own.  Laid on the ground throughout the night.  EMS assisted him up this morning.  Again had another fall later this afternoon.  Pt lives at home Alone.  CT of Chest: Right-sided volume loss with elevation of the right hemidiaphragm  and right middle and lower lobar collapse and consolidation.  Superimposed extensive airway impaction within the right lower lobe  and, to a lesser extent, right middle lobe and peribronchial  nodularity which may relate to superimposed changes aspiration or  acute infection. Trace right pleural effusion.  3. Mild to moderate global cardiomegaly with left ventricular  dilation. Extensive multi-vessel coronary artery calcification.  Morphologic changes in keeping with pulmonary arterial hypertension.  Head CT: No evidence of acute infarct, hemorrhage, mass, mass effect,  or midline shift. No hydrocephalus or extra-axial fluid collection.  Age related cerebral atrophy. Clinical Impression Patient presents with functional oropharyngeal swallowing for Age. NO aspiration nor laryngeal penetration noted w/ any  consistency. Pt fed self w/ setup support. Intermittent verbal cues given; baseline Cognitive decline/Dementia dx per chart. Oral stage is characterized by adequate lip closure, but min disorganized bolus preparation and containment followed by min decreased control of liquid boluses during anterior to posterior transit. Trace+ oral residue noted diffusely in oral cavity on tongue and upper denture plate; a f/u Dry swallow cleared this residue. Min increased mastication time/effort noted w/ soft solid d/t impact of Lacking Bottom Dentition (upper denture plate only).  Swallow initiation occurs at the level of the valleculae w/ majority of trials; thin liquids spilled inconsistently from the valleculae. Pharyngeal stage is noted for mildly reduced tongue base retraction (likely age-related), adequate hyolaryngeal excursion, and adequate pharyngeal constriction. Epiglottic deflection is complete; there is NO penetration nor aspiration.  There is trace-min BOT, valleculae and intermittently aeryepiglottic folds/pyriform sinus residue which cleared w/ f/u Dry swallow. Pharyngeal stripping wave is complete. Amplitude/duration of cricopharyngeus opening is WFL. There is adequate/complete clearance through the cervical esophagus. No retrograde activity noted in the cervical esophagus.  Consistencies tested were thin liquids x2 tsps, 3 cup sip, 2-3 sequential sips slowly, nectar x1 tsp, 1 cup sip, honey x1 tsp, pudding x1 tsp, regular solid (1/2 graham cracker with pudding). Would recommend Pills given in a Puree for the increased viscosity and d/t advanced Age of pt. Recommend continue a farily regular/mech soft diet consistency with thin liquids VIA CUP; general aspiration precautions including SMALL, SINGLE sips SLOWLY and moistening meats/foods for easier mastication. Recommend a Dry swallow after bites and sips -- BUT pt already does a Dry swallow independently.  No further ST services indicated.  IF it is felt that he  could have REFLUX activty contributing to risk for aspiration of retrograde, Reflux activity, the recommend f/u w/ GI for assessment.  MD and NSG made aware of the MBSS results and recommendations. Factors that may increase risk of adverse event in presence of aspiration Rubye Oaks & Clearance Coots 2021): Factors that may increase risk of adverse event in presence of aspiration Rubye Oaks & Clearance Coots 2021): Poor general health and/or compromised immunity; Reduced cognitive function; Frail or deconditioned (Lacking Bottom Dentition) Recommendations/Plan: Swallowing Evaluation Recommendations Swallowing Evaluation Recommendations Recommendations: PO diet PO Diet Recommendation: Dysphagia 3 (Mechanical soft); Thin liquids (Level 0) Liquid Administration via: Cup; No straw Medication Administration: Whole meds with puree (vs Crushed) Supervision: Patient able to self-feed; Set-up assistance for safety (monitor for needs) Swallowing strategies  : Minimize environmental distractions; Slow rate; Small bites/sips; Multiple dry swallows after each bite/sip Postural changes: Position pt fully upright for meals; Stay upright 30-60 min after meals Oral care recommendations: Oral care BID (2x/day); Pt independent with oral care (staff support) Recommended consults: -- (Dietician; Palliative Care for GOC) Treatment Plan Treatment Plan Treatment recommendations: No treatment recommended at this time Follow-up recommendations: No SLP follow up Recommendations Comment: appears at his baseline Functional status assessment: Patient has not had a recent decline in their functional status. Treatment frequency: -- (n/a) Treatment duration: -- (n/a) Interventions: Aspiration precaution training; Patient/family education Recommendations Recommendations for follow up therapy are one component of a multi-disciplinary discharge planning process, led by the attending physician.  Recommendations may be updated based on patient status, additional functional  criteria and insurance authorization. Assessment: Orofacial Exam: Orofacial Exam Oral Cavity: Oral Hygiene: WFL Oral Cavity - Dentition: Edentulous; Dentures, top (on bottom) Orofacial Anatomy: WFL Oral Motor/Sensory Function: WFL Anatomy: Anatomy: WFL Boluses Administered: Boluses Administered Boluses Administered: Thin liquids (Level 0); Mildly thick liquids (Level 2, nectar thick); Moderately thick liquids (Level 3, honey thick); Puree; Solid  Oral Impairment Domain: Oral Impairment Domain Lip Closure: No labial escape Tongue control during bolus hold: Escape to lateral buccal cavity/floor of mouth; Posterior escape of less than half of bolus Bolus preparation/mastication: Disorganized chewing/mashing with solid pieces of bolus unchewed (min d/t edentulous on bottom; top denture plate only) Bolus transport/lingual motion: Brisk tongue motion Oral residue: Trace residue lining oral structures (diffuse) Location of oral residue : Tongue (denture plate) Initiation of pharyngeal swallow : Valleculae; Posterior laryngeal surface of the epiglottis (spilling from valleculae w/ thin liquids)  Pharyngeal Impairment Domain: Pharyngeal Impairment Domain Soft palate elevation: No bolus between soft palate (SP)/pharyngeal wall (PW) Laryngeal elevation: Complete superior movement of thyroid cartilage with complete approximation of arytenoids to epiglottic petiole  Anterior hyoid excursion: Complete anterior movement Epiglottic movement: Complete inversion Laryngeal vestibule closure: Complete, no air/contrast in laryngeal vestibule Pharyngeal stripping wave : Present - diminished (functional) Pharyngeal contraction (A/P view only): N/A Pharyngoesophageal segment opening: Complete distension and complete duration, no obstruction of flow Tongue base retraction: No contrast between tongue base and posterior pharyngeal wall (PPW) Pharyngeal residue: Trace residue within or on pharyngeal structures Location of pharyngeal residue:  Tongue base; Valleculae; Aryepiglottic folds (cleared w/ f/u swallow)  Esophageal Impairment Domain: Esophageal Impairment Domain Esophageal clearance upright position: Complete clearance, esophageal coating Pill: No data recorded Penetration/Aspiration Scale Score: Penetration/Aspiration Scale Score 1.  Material does not enter airway: Thin liquids (Level 0); Mildly thick liquids (Level 2, nectar thick); Moderately thick liquids (Level 3, honey thick); Puree; Solid Compensatory Strategies: Compensatory Strategies Compensatory strategies: Yes Multiple swallows: Effective (f/u dry swallow x1) Effective Multiple Swallows: Thin liquid (Level 0); Mildly thick liquid (Level 2, nectar thick); Moderately thick liquid (Level 3, honey thick); Puree; Solid   General Information: Caregiver present: No  Diet Prior to this Study: NPO   Temperature : Normal (wbc 7.7)   Respiratory Status: WFL   Supplemental O2: None (Room air)   History of Recent Intubation: No  Behavior/Cognition: Alert; Cooperative; Pleasant mood; Distractible; Requires cueing (baseline Dementia dx) Self-Feeding Abilities: Able to self-feed; Needs set-up for self-feeding Baseline vocal quality/speech: Normal Volitional Cough: Able to elicit Volitional Swallow: Able to elicit Exam Limitations: No limitations Goal Planning: Prognosis for improved oropharyngeal function: Good Barriers to Reach Goals: Cognitive deficits Barriers/Prognosis Comment: baseline Dementia Patient/Family Stated Goal: "something to drink" Consulted and agree with results and recommendations: Patient; Nurse; Physician Pain: Pain Assessment Pain Assessment: No/denies pain End of Session: Start Time:SLP Start Time (ACUTE ONLY): 0915 Stop Time: SLP Stop Time (ACUTE ONLY): 1015 Time Calculation:SLP Time Calculation (min) (ACUTE ONLY): 60 min Charges: SLP Evaluations $ SLP Speech Visit: 1 Visit SLP Evaluations $MBS Swallow: 1 Procedure SLP visit diagnosis: SLP Visit Diagnosis: Dysphagia,  unspecified (R13.10) Past Medical History: Past Medical History: Diagnosis Date  CAD (coronary artery disease)   Nonobstructive cardiac disease by cath  CHF (congestive heart failure) (HCC)   nonischemic cardiomyopathy  CKD (chronic kidney disease)   History of kidney stones   Hyperlipidemia   Hypertension   Inguinal hernia   PAF (paroxysmal atrial fibrillation) (HCC)   Patella fracture 01/09/2015  Renal colic 10/07/2011  Ventricular tachycardia (HCC)   s/p ICD Past Surgical History: Past Surgical History: Procedure Laterality Date  CARDIAC CATHETERIZATION    CARDIAC DEFIBRILLATOR PLACEMENT  2009  CYSTOSCOPY WITH LITHOLAPAXY N/A 02/21/2018  Procedure: CYSTOSCOPY WITH LITHOLAPAXY;  Surgeon: Sondra Come, MD;  Location: [facility] ORS;  Service: Urology;  Laterality: N/A;  ELBOW SURGERY Left   HOLMIUM LASER APPLICATION N/A 02/21/2018  Procedure: HOLMIUM LASER APPLICATION;  Surgeon: Sondra Come, MD;  Location: [facility] ORS;  Service: Urology;  Laterality: N/A;  ICD GENERATOR CHANGEOUT N/A 01/28/2021  Procedure: ICD GENERATOR CHANGEOUT;  Surgeon: Marcina Millard, MD;  Location: [facility]ASIVE [facility];  Service: Cardiovascular;  Laterality: N/A;  INGUINAL HERNIA REPAIR    LITHOTRIPSY    ORIF left wrist fracture Left   ORIF PATELLA Left 01/10/2015  Procedure: OPEN REDUCTION INTERNAL (ORIF) FIXATION PATELLA;  Surgeon: Donato Heinz, MD;  Location: [facility] OR[facility];  Service: Orthopedics;  Laterality: Left;  ORIF PATELLA Left 02/06/2015  Procedure: OPEN REDUCTION INTERNAL (ORIF) FIXATION PATELLA;  Surgeon: Donato Heinz, MD;  Location: [facility] OR[facility];  Service: Orthopedics;  Laterality: Left;  TRANSURETHRAL RESECTION OF PROSTATE N/A 02/21/2018  Procedure: TRANSURETHRAL RESECTION OF THE PROSTATE (TURP);  Surgeon: Sondra Come, MD;  Location: [facility] OR[facility];  Service: Urology;  Laterality: N/A; Jerilynn Som, MS, CCC-SLP Speech Language Pathologist Rehab Services; [facility] [phone] (ascom) Watson,Katherine 11/18/2022, 3:19  PM  US Abdomen Limited RUQ (LIVER/GB)  Result Date: 11/18/2022 CLINICAL DATA:  Abnormal LFTs EXAM: ULTRASOUND ABDOMEN LIMITED RIGHT UPPER QUADRANT COMPARISON:  None Available. FINDINGS: Gallbladder: Gallbladder wall thickening. Gallstones, largest measures 1.2 cm. Pericholecystic fluid. No sonographic Murphy sign noted by sonographer. Common bile duct: Diameter: 2 mm Liver: No focal lesion identified. Increased parenchymal echogenicity. Portal vein is patent on color Doppler imaging with normal direction of blood flow towards the liver. Other: None. IMPRESSION: 1. Gallstones, gallbladder wall thickening and pericholecystic fluid. Although findings are concerning for acute cholecystitis, sonographic Eulah Pont sign is negative and findings could also be secondary to systemic process. Recommend clinical correlation. 2. Hepatic steatosis. Electronically Signed   By: Allegra Lai M.D.   On: 11/18/2022 10:40   CT Angio Chest Pulmonary Embolism (PE) W or WO Contrast  Result Date: 11/16/2022 CLINICAL DATA:  Pulmonary embolism (PE) suspected, high prob RLE swelling, elevated trop, elevated LFTs, AMS, falls; Abdominal pain, acute, nonlocalized elevated LFTs and t bili EXAM: CT ANGIOGRAPHY CHEST CT ABDOMEN AND PELVIS WITH CONTRAST TECHNIQUE: Multidetector CT imaging of the chest was performed using the standard protocol during bolus administration of intravenous contrast. Multiplanar CT image reconstructions and MIPs were obtained to evaluate the vascular anatomy. Multidetector CT imaging of the abdomen and pelvis was performed using the standard protocol during bolus administration of intravenous contrast. RADIATION DOSE REDUCTION: This exam was performed according to the departmental dose-optimization program which includes automated exposure control, adjustment of the mA and/or kV according to patient size and/or use of iterative reconstruction technique. CONTRAST:  OMNIPAQUE IOHEXOL 350 MG/ML SOLN  COMPARISON:  06/12/2021 FINDINGS: CTA CHEST FINDINGS Cardiovascular: Apparent filling defects within the right middle and lower lobar pulmonary arteries are artifactual and related to pulmonary vascular redistribution of setting of consolidation. Delayed imaging on CT examination of the abdomen pelvis demonstrates patency of these structures and no intraluminal filling defect identified to suggest pulmonary embolism. The central pulmonary arteries, however, are enlarged in keeping with changes of pulmonary arterial hypertension. Extensive multi-vessel coronary artery calcification. Extensive calcification of the aortic valve leaflets. Mild to moderate global cardiomegaly with left ventricular dilation. Left subclavian pacemaker in place with leads within the right atrium, right ventricle, and left ventricular venous outflow. No pericardial effusion. Moderate atherosclerotic calcification within the thoracic aorta. No aortic aneurysm. Mediastinum/Nodes: Visualized thyroid is unremarkable. No pathologic thoracic adenopathy. Esophagus unremarkable. Lungs/Pleura: There is right-sided volume loss with elevation of the right hemidiaphragm and right middle and lower lobar collapse and consolidation. Superimposed extensive airway impaction within the right lower lobe and, to a lesser extent, right middle lobe and peribronchial nodularity is seen which may relate to superimposed changes aspiration or acute infection. Trace right pleural effusion. No pneumothorax. Musculoskeletal: No acute bone abnormality. No lytic or blastic bone lesion. Review of the MIP images confirms the above findings. CT ABDOMEN and PELVIS FINDINGS Hepatobiliary: Cholelithiasis without pericholecystic inflammatory changes identified. Liver unremarkable. No intra or extrahepatic biliary ductal dilation. Pancreas: Unremarkable Spleen: Unremarkable Adrenals/Urinary Tract: The adrenal glands are unremarkable. The kidneys are mildly atrophic. Multiple  simple cortical and parapelvic cysts are seen within the kidneys bilaterally for which no follow-up imaging is recommended. The kidneys are otherwise unremarkable.  The bladder contains a 15 mm dependently layering calculus. The bladder is not distended. Stomach/Bowel: The stomach, small bowel, and large bowel are unremarkable. The appendix is absent. No free intraperitoneal gas or fluid. Vascular/Lymphatic: Extensive aortoiliac atherosclerotic calcification. Extensive visceral arteriosclerosis. No aortic aneurysm. No pathologic adenopathy within the abdomen and pelvis. Reproductive: Prostate is unremarkable. Other: No abdominal wall hernia. Diffuse subcutaneous body wall edema is present in keeping with changes of mild anasarca. Musculoskeletal: Osseous structures are age-appropriate. Diffuse osteopenia. No acute bone abnormality. No lytic or blastic bone lesion. Review of the MIP images confirms the above findings. IMPRESSION: 1. No pulmonary embolism. 2. Right-sided volume loss with elevation of the right hemidiaphragm and right middle and lower lobar collapse and consolidation. Superimposed extensive airway impaction within the right lower lobe and, to a lesser extent, right middle lobe and peribronchial nodularity which may relate to superimposed changes aspiration or acute infection. Trace right pleural effusion. 3. Mild to moderate global cardiomegaly with left ventricular dilation. Extensive multi-vessel coronary artery calcification. Morphologic changes in keeping with pulmonary arterial hypertension. 4. Cholelithiasis. 5. 15 mm dependently layering calculus within the bladder. 6. Mild anasarca. Aortic Atherosclerosis (ICD10-I70.0). Electronically Signed   By: Helyn Numbers M.D.   On: 11/27/2022 22:15   CT ABDOMEN PELVIS W CONTRAST  Result Date: 11/29/2022 CLINICAL DATA:  Pulmonary embolism (PE) suspected, high prob RLE swelling, elevated trop, elevated LFTs, AMS, falls; Abdominal pain, acute,  nonlocalized elevated LFTs and t bili EXAM: CT ANGIOGRAPHY CHEST CT ABDOMEN AND PELVIS WITH CONTRAST TECHNIQUE: Multidetector CT imaging of the chest was performed using the standard protocol during bolus administration of intravenous contrast. Multiplanar CT image reconstructions and MIPs were obtained to evaluate the vascular anatomy. Multidetector CT imaging of the abdomen and pelvis was performed using the standard protocol during bolus administration of intravenous contrast. RADIATION DOSE REDUCTION: This exam was performed according to the departmental dose-optimization program which includes automated exposure control, adjustment of the mA and/or kV according to patient size and/or use of iterative reconstruction technique. CONTRAST:  OMNIPAQUE IOHEXOL 350 MG/ML SOLN COMPARISON:  06/12/2021 FINDINGS: CTA CHEST FINDINGS Cardiovascular: Apparent filling defects within the right middle and lower lobar pulmonary arteries are artifactual and related to pulmonary vascular redistribution of setting of consolidation. Delayed imaging on CT examination of the abdomen pelvis demonstrates patency of these structures and no intraluminal filling defect identified to suggest pulmonary embolism. The central pulmonary arteries, however, are enlarged in keeping with changes of pulmonary arterial hypertension. Extensive multi-vessel coronary artery calcification. Extensive calcification of the aortic valve leaflets. Mild to moderate global cardiomegaly with left ventricular dilation. Left subclavian pacemaker in place with leads within the right atrium, right ventricle, and left ventricular venous outflow. No pericardial effusion. Moderate atherosclerotic calcification within the thoracic aorta. No aortic aneurysm. Mediastinum/Nodes: Visualized thyroid is unremarkable. No pathologic thoracic adenopathy. Esophagus unremarkable. Lungs/Pleura: There is right-sided volume loss with elevation of the right hemidiaphragm and  right middle and lower lobar collapse and consolidation. Superimposed extensive airway impaction within the right lower lobe and, to a lesser extent, right middle lobe and peribronchial nodularity is seen which may relate to superimposed changes aspiration or acute infection. Trace right pleural effusion. No pneumothorax. Musculoskeletal: No acute bone abnormality. No lytic or blastic bone lesion. Review of the MIP images confirms the above findings. CT ABDOMEN and PELVIS FINDINGS Hepatobiliary: Cholelithiasis without pericholecystic inflammatory changes identified. Liver unremarkable. No intra or extrahepatic biliary ductal dilation. Pancreas: Unremarkable Spleen: Unremarkable Adrenals/Urinary Tract: The adrenal glands  are unremarkable. The kidneys are mildly atrophic. Multiple simple cortical and parapelvic cysts are seen within the kidneys bilaterally for which no follow-up imaging is recommended. The kidneys are otherwise unremarkable. The bladder contains a 15 mm dependently layering calculus. The bladder is not distended. Stomach/Bowel: The stomach, small bowel, and large bowel are unremarkable. The appendix is absent. No free intraperitoneal gas or fluid. Vascular/Lymphatic: Extensive aortoiliac atherosclerotic calcification. Extensive visceral arteriosclerosis. No aortic aneurysm. No pathologic adenopathy within the abdomen and pelvis. Reproductive: Prostate is unremarkable. Other: No abdominal wall hernia. Diffuse subcutaneous body wall edema is present in keeping with changes of mild anasarca. Musculoskeletal: Osseous structures are age-appropriate. Diffuse osteopenia. No acute bone abnormality. No lytic or blastic bone lesion. Review of the MIP images confirms the above findings. IMPRESSION: 1. No pulmonary embolism. 2. Right-sided volume loss with elevation of the right hemidiaphragm and right middle and lower lobar collapse and consolidation. Superimposed extensive airway impaction within the right  lower lobe and, to a lesser extent, right middle lobe and peribronchial nodularity which may relate to superimposed changes aspiration or acute infection. Trace right pleural effusion. 3. Mild to moderate global cardiomegaly with left ventricular dilation. Extensive multi-vessel coronary artery calcification. Morphologic changes in keeping with pulmonary arterial hypertension. 4. Cholelithiasis. 5. 15 mm dependently layering calculus within the bladder. 6. Mild anasarca. Aortic Atherosclerosis (ICD10-I70.0). Electronically Signed   By: Helyn Numbers M.D.   On: 12/05/2022 22:15   CT Head Wo Contrast  Result Date: 11/12/2022 CLINICAL DATA:  Multiple falls EXAM: CT HEAD WITHOUT CONTRAST CT CERVICAL SPINE WITHOUT CONTRAST TECHNIQUE: Multidetector CT imaging of the head and cervical spine was performed following the standard protocol without intravenous contrast. Multiplanar CT image reconstructions of the cervical spine were also generated. RADIATION DOSE REDUCTION: This exam was performed according to the departmental dose-optimization program which includes automated exposure control, adjustment of the mA and/or kV according to patient size and/or use of iterative reconstruction technique. COMPARISON:  05/16/2022 CT head, no prior CT cervical spine. FINDINGS: CT HEAD FINDINGS Brain: No evidence of acute infarct, hemorrhage, mass, mass effect, or midline shift. No hydrocephalus or extra-axial fluid collection. Age related cerebral atrophy. Vascular: No hyperdense vessel. Atherosclerotic calcifications in the intracranial carotid and vertebral arteries. Skull: Negative for fracture or focal lesion. Sinuses/Orbits: No acute finding. Chronic left lamina papyracea fracture. Other: The mastoid air cells are well aerated. CT CERVICAL SPINE FINDINGS Alignment: No traumatic listhesis. Trace anterolisthesis of to C5 on C6 and C7 on T1, which appears facet mediated. Dextrocurvature of the cervical spine may be positional.  Skull base and vertebrae: No acute fracture or suspicious osseous lesion. Soft tissues and spinal canal: No prevertebral fluid or swelling. No visible canal hematoma. Disc levels: Degenerative changes in the cervical spine.No high-grade spinal canal stenosis. Upper chest: No focal pulmonary opacity or pleural effusion. IMPRESSION: 1. No acute intracranial process. 2. No acute fracture or traumatic listhesis in the cervical spine. Electronically Signed   By: Wiliam Ke M.D.   On: 12/10/2022 19:51   CT CERVICAL SPINE WO CONTRAST  Result Date: 12/02/2022 CLINICAL DATA:  Multiple falls EXAM: CT HEAD WITHOUT CONTRAST CT CERVICAL SPINE WITHOUT CONTRAST TECHNIQUE: Multidetector CT imaging of the head and cervical spine was performed following the standard protocol without intravenous contrast. Multiplanar CT image reconstructions of the cervical spine were also generated. RADIATION DOSE REDUCTION: This exam was performed according to the departmental dose-optimization program which includes automated exposure control, adjustment of the mA and/or kV according to  patient size and/or use of iterative reconstruction technique. COMPARISON:  05/16/2022 CT head, no prior CT cervical spine. FINDINGS: CT HEAD FINDINGS Brain: No evidence of acute infarct, hemorrhage, mass, mass effect, or midline shift. No hydrocephalus or extra-axial fluid collection. Age related cerebral atrophy. Vascular: No hyperdense vessel. Atherosclerotic calcifications in the intracranial carotid and vertebral arteries. Skull: Negative for fracture or focal lesion. Sinuses/Orbits: No acute finding. Chronic left lamina papyracea fracture. Other: The mastoid air cells are well aerated. CT CERVICAL SPINE FINDINGS Alignment: No traumatic listhesis. Trace anterolisthesis of to C5 on C6 and C7 on T1, which appears facet mediated. Dextrocurvature of the cervical spine may be positional. Skull base and vertebrae: No acute fracture or suspicious osseous  lesion. Soft tissues and spinal canal: No prevertebral fluid or swelling. No visible canal hematoma. Disc levels: Degenerative changes in the cervical spine.No high-grade spinal canal stenosis. Upper chest: No focal pulmonary opacity or pleural effusion. IMPRESSION: 1. No acute intracranial process. 2. No acute fracture or traumatic listhesis in the cervical spine. Electronically Signed   By: Wiliam Ke M.D.   On: 12/09/2022 19:51    Microbiology: Recent Results (from the past 240 hour(s))  Blood culture (routine x 2)     Status: Abnormal   Collection Time: 11/18/2022 10:49 PM   Specimen: BLOOD  Result Value Ref Range Status   Specimen Description   Final    BLOOD RIGHT FOREARM Performed at [facility], [address], [place], [place] [address]    Special Requests   Final    BOTTLES DRAWN AEROBIC AND ANAEROBIC Blood Culture adequate volume Performed at [facility], [address] [place], [place] [address]    Culture  Setup Time   Final    AEROBIC BOTTLE ONLY GRAM POSITIVE RODS CRITICAL RESULT CALLED TO, READ BACK BY AND VERIFIED WITH: ANDREA DOBBS @1448  11/19/22 MJU    Culture (A)  Final    CORYNEBACTERIUM PSEUDODIPHTHERIAE Standardized susceptibility testing for this organism is not available. Performed at [facility], [address], [place], [place] [address]    Report Status 11/22/2022 FINAL  Final  Blood culture (routine x 2)     Status: None   Collection Time: 12/04/2022 10:49 PM   Specimen: BLOOD  Result Value Ref Range Status   Specimen Description BLOOD LEFT FOREARM  Final   Special Requests   Final    BOTTLES DRAWN AEROBIC AND ANAEROBIC Blood Culture results may not be optimal due to an inadequate volume of blood received in culture bottles   Culture   Final    NO GROWTH 5 DAYS Performed at [facility], [address], [place], [place] [address]    Report Status 11/22/2022 FINAL  Final  MRSA Next Gen by PCR, Nasal     Status:  None   Collection Time: 11/19/22  9:36 AM   Specimen: Nasal Mucosa; Nasal Swab  Result Value Ref Range Status   MRSA by PCR Next Gen NOT DETECTED NOT DETECTED Final    Comment: (NOTE) The GeneXpert MRSA Assay (FDA approved for NASAL specimens only), is one component of a comprehensive MRSA colonization surveillance program. It is not intended to diagnose MRSA infection nor to guide or monitor treatment for MRSA infections. Test performance is not FDA approved in patients less than 49 years old. Performed at [facility], [address] [place], [place] [address]     Time spent: Greater than 30 minutes  Signed: Lurene Shadow, MD 12/08/2022

## 2022-12-11 NOTE — Plan of Care (Signed)
?  Problem: Education: ?Goal: Ability to demonstrate management of disease process will improve ?Outcome: Not Progressing ?Goal: Ability to verbalize understanding of medication therapies will improve ?Outcome: Not Progressing ?Goal: Individualized Educational Video(s) ?Outcome: Not Progressing ?  ?Problem: Activity: ?Goal: Capacity to carry out activities will improve ?Outcome: Not Progressing ?  ?

## 2022-12-11 NOTE — Progress Notes (Signed)
[facility] (IPU) REFERRAL NOTE      Received request from Charlynn Court, SW, Transitions of Care Manager, for  evaluation for IPU.   Per Dr. Alphonsus Sias, hospice MD, patient does not meet eligibility for IPU at this time.  I left a message with Melina Schools, daughter/HCPOA, to call me back regarding referral and next steps.    Hospital liaison team will continue to follow through final disposition.  Above information shared with Charlynn Court, SW/Transitions of Care Manager and hospital medical team.  Please call with any hospice related questions or concerns.  Thank you for the opportunity to participate in this patient's care.  Norris Cross, RN Nurse Liaison [phone]

## 2022-12-11 NOTE — Progress Notes (Signed)
 Heart Failure Navigator Progress Note  Assessed for Heart & Vascular TOC clinic readiness.  Patient does not meet criteria due to dementia diagnosis.   Navigator will sign off at this time.  Roxy Horseman, RN, BSN [facility] Heart Failure Navigator Secure Chat Only

## 2022-12-11 NOTE — Progress Notes (Signed)
Discharge    Progress Note    Spencer Coleman  ZOX:[id number] DOB: 01-Nov-1930  DOA: 12/07/2022 PCP: Barbette Reichmann, MD      Brief Narrative:    Medical records reviewed and are as summarized below:  Spencer Coleman is a 89 y.o. 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  He presented to the hospital because of multiple falls. Patient fell the night prior to admission and was unable to get up spending all night on the floor.  EMS assisted him up the following morning but he fell again in the afternoon and was brought in by EMS for evaluation.    On arrival BP 99/51 with mild tachypnea to the low 20s and otherwise normal vitals. Labs: Troponin 476--356, with BNP> 4500 WBC 8900 with lactic acid 1.8.  Hemoglobin 12 VBG with normal pH and pCO2 CK 920, ammonia 11, UA unremarkable BMP with creatinine 1.92 up from baseline of 1.4 a month ago Markedly abnormal hepatic function panel as follows: AST 1751, ALT 1049, alk phos 134, total bili 2.6         Assessment/Plan:   Active Problems:   Acute on chronic combined systolic and diastolic CHF (congestive heart failure) (HCC)   Aspiration pneumonia (HCC)   Elevated LFTs   Acute renal failure superimposed on stage 3a chronic kidney disease (HCC)   Automatic implantable cardioverter-defibrillator in situ   CAD (coronary artery disease)   Chronic anticoagulation   Cholelithiasis   Frequent falls   NSTEMI (non-ST elevated myocardial infarction) (HCC)   Paroxysmal atrial fibrillation (HCC)   Rhabdomyolysis   Thrombocytopenia (HCC)   Protein calorie malnutrition (HCC)   Physical deconditioning   Hypotension   Frailty     Body mass  index is 20.53 kg/m.  (Underweight)   Acute on chronic systolic and diastolic CHF (congestive heart failure) (HCC) Cardiomyopathy, EF 20 to 25%, grade 2 diastolic dysfunction in 05/2022 Patient clinically fluid overloaded, BNP over 4500, chest CT showing global cardiomegaly, mild anasarca.  2D echo on 11/18/2022 showed estimated EF 20 to 25%, indeterminate LV diastolic parameters.  EF is unchanged from prior echo in May 2024. Follow-up follow-up with cardiologist.    Elevated liver enzymes Liver enzymes have trended downward though still elevated.  Shock liver/ischemic hepatopathy suspected. Liver ultrasound showed hepatic steatosis, gallstones, gallbladder wall thickening and pericholecystic fluid concerning for acute cholecystitis. Patient was evaluated by general surgeon.  Acute cholecystitis is not suspected at this time.  Patient is asymptomatic..    Aspiration pneumonia (HCC) Chest CT showing right middle and lower lobe collapse and consolidation Speech therapist recommended dysphagia 3 diet Antibiotics discontinued because of comfort measures   Demand ischemia, elevated troponins No chest pain.  Type I NSTEMI has been ruled out.  Metoprolol on hold. IV heparin was discontinued on 11/19/2022    Rhabdomyolysis Frequent falls/frailty/physical deconditioning CK trended down Patient will likely be discharged to hospice house.    Paroxysmal atrial fibrillation (HCC) with RVR History of ventricular tachycardia s/p AICD Chronic anticoagulation Amiodarone discontinued because of elevated liver enzymes: IV digoxin 0.125 mg x 1 dose was given on 11/19/2022. [name], daughter, said not to restart Eliquis (discussed on 11/19/2022).    Acute renal failure superimposed on stage  3a chronic kidney disease (HCC) This could be cardiorenal. No more blood test because he is on comfort measures   Thrombocytopenia (HCC) Platelet from 96-107 No more blood test because he is on comfort  measures   Delirium with agitation, underlying cognitive impairment/dementia Continue Seroquel    Prognosis is poor Continue comfort measures. Follow-up with hospice team and TOC to assist with disposition.   Diet Order             DIET DYS 3 Room service appropriate? Yes with Assist; Fluid consistency: Thin  Diet effective now                            Consultants: Cardiologist  Procedures: None    Medications:    QUEtiapine  25 mg Oral QHS   Continuous Infusions:     Anti-infectives (From admission, onward)    Start     Dose/Rate Route Frequency Ordered Stop   11/20/22 2200  amoxicillin-clavulanate (AUGMENTIN) 500-125 MG per tablet 1 tablet  Status:  Discontinued        1 tablet Oral 2 times daily 11/20/22 1251 11/20/22 1549   11/20/22 0000  vancomycin (VANCOCIN) IVPB 1000 mg/200 mL premix  Status:  Discontinued        1,000 mg 200 mL/hr over 60 Minutes Intravenous Every 48 hours 11/18/22 0229 11/18/22 1511   11/19/22 2200  vancomycin (VANCOREADY) IVPB 1250 mg/250 mL  Status:  Discontinued        1,250 mg 166.7 mL/hr over 90 Minutes Intravenous Every 48 hours 11/18/22 1511 11/20/22 1251   11/18/22 2300  ceFEPIme (MAXIPIME) 2 g in sodium chloride 0.9 % 100 mL IVPB  Status:  Discontinued        2 g 200 mL/hr over 30 Minutes Intravenous Every 24 hours 11/18/22 0223 11/20/22 1251   11/13/2022 2245  vancomycin (VANCOCIN) IVPB 1000 mg/200 mL premix        1,000 mg 200 mL/hr over 60 Minutes Intravenous  Once 11/28/2022 2232 11/18/22 0123   12/06/2022 2245  ceFEPIme (MAXIPIME) 2 g in sodium chloride 0.9 % 100 mL IVPB        2 g 200 mL/hr over 30 Minutes Intravenous  Once 11/26/2022 2232 12/05/2022 2346              Family Communication/Anticipated D/C date and plan/Code Status   DVT prophylaxis:      Code Status: Do not attempt resuscitation (DNR) - Comfort care  Family Communication: None Disposition Plan: May need discharge to  SNF   Status is: Inpatient Remains inpatient appropriate because: CHF exacerbation, pneumonia       Subjective:   He is somnolent and unable to provide any history at this time.  Objective:    Vitals:   11/20/22 2307 11/20/2022 0335 12/02/2022 0420 11/19/2022 0857  BP: 101/72 (!) 93/50  (!) 88/61  Pulse: 92 77  (!) 123  Resp:  20  20  Temp: 98.5 F (36.9 C) 97.7 F (36.5 C)  (!) 97.5 F (36.4 C)  TempSrc: Oral Oral  Oral  SpO2: 96% 99%  98%  Weight:   64.9 kg   Height:       No data found.   Intake/Output Summary (Last 24 hours) at 11/29/2022 1509 Last data filed at 11/20/2022 2306 Gross per 24 hour  Intake --  Output 700 ml  Net -700 ml   Filed Weights   12/05/2022 1400 12/02/2022 0420  Weight: 67.6 kg 64.9 kg    Exam:   GEN: NAD SKIN: Warm and dry EYES: Grossly, no acute abnormality ENT: MMM CV: Irregular rate and rhythm, tachycardic PULM: Bibasilar rales ABD: soft, ND, NT, +BS CNS: Somnolent.  Limited exam EXT: Bilateral leg and pedal edema     Data Reviewed:   I have personally reviewed following labs and imaging studies:  Labs: Labs show the following:   Basic Metabolic Panel: Recent Labs  Lab 11/24/2022 1404 11/18/22 0454 11/19/22 0623 11/20/22 0527  NA 133* 136 133* 137  K 4.4 4.6 3.9 4.1  CL 104 102 98 102  CO2 20* 26 25 22   GLUCOSE 96 87 114* 109*  BUN 37* 37* 38* 43*  CREATININE 1.92* 1.71* 1.92* 1.77*  CALCIUM 8.2* 8.3* 8.2* 8.6*  MG  --  2.1  --   --    GFR Estimated Creatinine Clearance: 25.5 mL/min (A) (by C-G formula based on SCr of 1.77 mg/dL (H)). Liver Function Tests: Recent Labs  Lab 11/11/2022 1718 11/18/22 0454 11/19/22 0623 11/20/22 0527  AST 1,751* 2,087* 887* 334*  ALT 1,049* 1,433* 1,148* 778*  ALKPHOS 134* 109 105 91  BILITOT 2.6* 1.8* 1.6* 1.4*  PROT 5.9* 5.2* 5.1* 5.1*  ALBUMIN 3.4* 3.0* 2.8* 2.7*   No results for input(s): "LIPASE", "AMYLASE" in the last 168 hours. Recent Labs  Lab 11/11/2022 1844   AMMONIA 11   Coagulation profile Recent Labs  Lab 11/18/22 0215  INR 1.5*    CBC: Recent Labs  Lab 11/16/2022 1404 11/18/22 0454 11/19/22 0623 11/20/22 0527  WBC 8.9 7.7 6.6 6.6  NEUTROABS 7.8*  --   --  5.1  HGB 12.0* 12.4* 13.6 14.0  HCT 36.5* 38.8* 41.5 42.6  MCV 94.1 94.6 92.8 93.0  PLT 75* 70* 96* 107*   Cardiac Enzymes: Recent Labs  Lab 11/13/2022 1718 11/18/22 0454  CKTOTAL 920* 692*   BNP (last 3 results) No results for input(s): "PROBNP" in the last 8760 hours. CBG: No results for input(s): "GLUCAP" in the last 168 hours. D-Dimer: No results for input(s): "DDIMER" in the last 72 hours. Hgb A1c: No results for input(s): "HGBA1C" in the last 72 hours. Lipid Profile: No results for input(s): "CHOL", "HDL", "LDLCALC", "TRIG", "CHOLHDL", "LDLDIRECT" in the last 72 hours. Thyroid function studies: No results for input(s): "TSH", "T4TOTAL", "T3FREE", "THYROIDAB" in the last 72 hours.  Invalid input(s): "FREET3" Anemia work up: No results for input(s): "VITAMINB12", "FOLATE", "FERRITIN", "TIBC", "IRON", "RETICCTPCT" in the last 72 hours. Sepsis Labs: Recent Labs  Lab 11/20/2022 1404 11/22/2022 2249 11/18/22 0454 11/19/22 0623 11/20/22 0527  WBC 8.9  --  7.7 6.6 6.6  LATICACIDVEN  --  1.8  --   --   --     Microbiology Recent Results (from the past 240 hour(s))  Blood culture (routine x 2)     Status: None (Preliminary result)   Collection Time: 11/20/2022 10:49 PM   Specimen: BLOOD  Result Value Ref Range Status   Specimen Description   Final    BLOOD RIGHT FOREARM Performed at [facility], [address] [place], [place] [address]    Special Requests   Final    BOTTLES DRAWN AEROBIC AND ANAEROBIC Blood Culture adequate volume Performed at [facility], [address] [place], [place] [address]    Culture  Setup Time   Final    AEROBIC BOTTLE ONLY GRAM POSITIVE RODS CRITICAL RESULT CALLED TO, READ BACK BY AND VERIFIED WITH:  ANDREA DOBBS @1448  11/19/22 MJU    Culture   Final    GRAM POSITIVE RODS CULTURE REINCUBATED FOR BETTER GROWTH Performed at [facility], [address], [place], [place] [address]    Report Status PENDING  Incomplete  Blood culture (routine x 2)     Status: None (Preliminary result)   Collection Time: 12/03/2022 10:49 PM   Specimen: BLOOD  Result Value Ref Range Status   Specimen Description   Final    BLOOD LEFT FOREARM Performed at [facility], [address] [place], [place] [address]    Special Requests   Final    BOTTLES DRAWN AEROBIC AND ANAEROBIC Blood Culture results may not be optimal due to an inadequate volume of blood received in culture bottles Performed at [facility], [address], [place], [place] [address]    Culture   Final    NO GROWTH 4 DAYS Performed at [facility], [address], [place], [place] [address]    Report Status PENDING  Incomplete  MRSA Next Gen by PCR, Nasal     Status: None   Collection Time: 11/19/22  9:36 AM   Specimen: Nasal Mucosa; Nasal Swab  Result Value Ref Range Status   MRSA by PCR Next Gen NOT DETECTED NOT DETECTED Final    Comment: (NOTE) The GeneXpert MRSA Assay (FDA approved for NASAL specimens only), is one component of a comprehensive MRSA colonization surveillance program. It is not intended to diagnose MRSA infection nor to guide or monitor treatment for MRSA infections. Test performance is not FDA approved in patients less than 47 years old. Performed at [facility], [address] [place], [place] [address]     Procedures and diagnostic studies:  No results found.             LOS: 4 days   Nuvia Hileman  [facility]   Pager on [email or web address]. If 7PM-7AM, please contact night-coverage at [email or web address]     12/10/2022, 3:09 PM

## 2022-12-11 DEATH — deceased

## 2022-12-27 ENCOUNTER — Encounter: Payer: Medicare HMO | Admitting: Family
